# Patient Record
Sex: Male | Born: 1965
Health system: Southern US, Community
[De-identification: ages and names within clinical notes are randomized; demographics above are authoritative.]

## PROBLEM LIST (undated history)

## (undated) DIAGNOSIS — E785 Hyperlipidemia, unspecified: Secondary | ICD-10-CM

## (undated) DIAGNOSIS — E119 Type 2 diabetes mellitus without complications: Secondary | ICD-10-CM

## (undated) DIAGNOSIS — I82409 Acute embolism and thrombosis of unspecified deep veins of unspecified lower extremity: Secondary | ICD-10-CM

## (undated) DIAGNOSIS — E669 Obesity, unspecified: Secondary | ICD-10-CM

## (undated) DIAGNOSIS — N529 Male erectile dysfunction, unspecified: Secondary | ICD-10-CM

## (undated) DIAGNOSIS — I739 Peripheral vascular disease, unspecified: Secondary | ICD-10-CM

## (undated) DIAGNOSIS — I10 Essential (primary) hypertension: Secondary | ICD-10-CM

## (undated) HISTORY — DX: Male erectile dysfunction, unspecified: N52.9

## (undated) HISTORY — DX: Hyperlipidemia, unspecified: E78.5

## (undated) HISTORY — DX: Acute embolism and thrombosis of unspecified deep veins of unspecified lower extremity: I82.409

## (undated) HISTORY — DX: Essential (primary) hypertension: I10

## (undated) HISTORY — DX: Peripheral vascular disease, unspecified: I73.9

## (undated) HISTORY — DX: Obesity, unspecified: E66.9

## (undated) HISTORY — DX: Type 2 diabetes mellitus without complications: E11.9

---

## 1998-01-22 ENCOUNTER — Emergency Department (HOSPITAL_COMMUNITY): Admission: EM | Admit: 1998-01-22 | Discharge: 1998-01-22 | Payer: Self-pay | Admitting: Emergency Medicine

## 1998-01-22 ENCOUNTER — Encounter: Payer: Self-pay | Admitting: Emergency Medicine

## 1998-01-24 ENCOUNTER — Encounter: Payer: Self-pay | Admitting: Emergency Medicine

## 1998-01-24 ENCOUNTER — Emergency Department (HOSPITAL_COMMUNITY): Admission: EM | Admit: 1998-01-24 | Discharge: 1998-01-24 | Payer: Self-pay | Admitting: *Deleted

## 1998-01-30 ENCOUNTER — Encounter: Admission: RE | Admit: 1998-01-30 | Discharge: 1998-04-02 | Payer: Self-pay | Admitting: Orthopedic Surgery

## 2001-06-27 ENCOUNTER — Encounter: Payer: Self-pay | Admitting: Emergency Medicine

## 2001-06-27 ENCOUNTER — Emergency Department (HOSPITAL_COMMUNITY): Admission: EM | Admit: 2001-06-27 | Discharge: 2001-06-27 | Payer: Self-pay | Admitting: *Deleted

## 2011-05-07 ENCOUNTER — Ambulatory Visit (INDEPENDENT_AMBULATORY_CARE_PROVIDER_SITE_OTHER): Payer: 59 | Admitting: Family Medicine

## 2011-05-07 DIAGNOSIS — Z0000 Encounter for general adult medical examination without abnormal findings: Secondary | ICD-10-CM

## 2011-05-07 DIAGNOSIS — N529 Male erectile dysfunction, unspecified: Secondary | ICD-10-CM | POA: Insufficient documentation

## 2011-05-07 DIAGNOSIS — I10 Essential (primary) hypertension: Secondary | ICD-10-CM | POA: Insufficient documentation

## 2011-05-07 DIAGNOSIS — E669 Obesity, unspecified: Secondary | ICD-10-CM | POA: Insufficient documentation

## 2011-05-07 LAB — POCT CBC
Granulocyte percent: 51 %G (ref 37–80)
HCT, POC: 44.8 % (ref 43.5–53.7)
Hemoglobin: 14.2 g/dL (ref 14.1–18.1)
Lymph, poc: 3 (ref 0.6–3.4)
MCH, POC: 28.3 pg (ref 27–31.2)
MCHC: 31.7 g/dL — AB (ref 31.8–35.4)
MCV: 89.2 fL (ref 80–97)
MID (cbc): 1.2 — AB (ref 0–0.9)
MPV: 9.6 fL (ref 0–99.8)
POC Granulocyte: 4.4 (ref 2–6.9)
POC LYMPH PERCENT: 34.9 %L (ref 10–50)
POC MID %: 14.1 %M — AB (ref 0–12)
Platelet Count, POC: 247 10*3/uL (ref 142–424)
RBC: 5.02 M/uL (ref 4.69–6.13)
RDW, POC: 16.6 %
WBC: 8.6 10*3/uL (ref 4.6–10.2)

## 2011-05-07 LAB — POCT URINALYSIS DIPSTICK
Bilirubin, UA: NEGATIVE
Blood, UA: NEGATIVE
Glucose, UA: NEGATIVE
Ketones, UA: NEGATIVE
Leukocytes, UA: NEGATIVE
Nitrite, UA: NEGATIVE
Protein, UA: NEGATIVE
Spec Grav, UA: 1.02
Urobilinogen, UA: 0.2
pH, UA: 5

## 2011-05-07 LAB — COMPREHENSIVE METABOLIC PANEL
ALT: 10 U/L (ref 0–53)
AST: 14 U/L (ref 0–37)
Albumin: 4 g/dL (ref 3.5–5.2)
Alkaline Phosphatase: 64 U/L (ref 39–117)
BUN: 16 mg/dL (ref 6–23)
CO2: 28 mEq/L (ref 19–32)
Calcium: 9.5 mg/dL (ref 8.4–10.5)
Chloride: 103 mEq/L (ref 96–112)
Creat: 1.29 mg/dL (ref 0.50–1.35)
Glucose, Bld: 91 mg/dL (ref 70–99)
Potassium: 4.2 mEq/L (ref 3.5–5.3)
Sodium: 137 mEq/L (ref 135–145)
Total Bilirubin: 0.4 mg/dL (ref 0.3–1.2)
Total Protein: 6.3 g/dL (ref 6.0–8.3)

## 2011-05-07 LAB — LIPID PANEL
Cholesterol: 177 mg/dL (ref 0–200)
HDL: 47 mg/dL (ref >39)
LDL Cholesterol: 114 mg/dL — ABNORMAL HIGH (ref 0–99)
Total CHOL/HDL Ratio: 3.8 Ratio
Triglycerides: 79 mg/dL (ref <150)
VLDL: 16 mg/dL (ref 0–40)

## 2011-05-07 LAB — POCT UA - MICROSCOPIC ONLY
Casts, Ur, LPF, POC: NEGATIVE
Crystals, Ur, HPF, POC: NEGATIVE
Mucus, UA: NEGATIVE
Yeast, UA: NEGATIVE

## 2011-05-07 MED ORDER — HYDROCHLOROTHIAZIDE 25 MG PO TABS: 25.0000 mg | ORAL_TABLET | Freq: Every day | ORAL | Status: DC

## 2011-05-07 MED ORDER — CLONIDINE HCL 0.2 MG PO TABS: 0.2000 mg | ORAL_TABLET | Freq: Three times a day (TID) | ORAL | Status: DC

## 2011-05-07 MED ORDER — SILDENAFIL CITRATE 100 MG PO TABS: 50.0000 mg | ORAL_TABLET | Freq: Every day | ORAL | Status: DC | PRN

## 2011-05-07 MED ORDER — AMLODIPINE BESYLATE 10 MG PO TABS: 10.0000 mg | ORAL_TABLET | Freq: Every day | ORAL | Status: DC

## 2011-05-07 NOTE — Progress Notes (Signed)
This is a 46 year old gentleman comes in for complete physical. 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 Has long history of hypertension which has been controlled on 3 medications. His father died at 89 of a heart attack and his mother died at an early age of cirrhosis of the liver. He continues to smoke about 10 cigarettes a day. He complains of erectile dysfunction.  Objective: 46 year old gentleman appearing stated age he is morbidly obese. He is in no acute distress. He is alert and cooperative.  Skin warm and dry  HEENT: Unremarkable with normal fundi.  Neck supple no adenopathy no thyromegaly.  Axilla no adenopathy  Chest clear to auscultation  Heart regular no murmur or gallop  Abdomen: Protuberant no masses or HSM palpable  Extremities: No edema, full range of motion  Genitalia: Normal  EKG:  occas PVC  Assessment: Patient is at high risk for cardiac disease. His risk factors of smoking, hypertension, erectile dysfunction, and family history.  Plan: Cardiology evaluation, continue current medication, Rx for Viagra and encourage smoking cessation

## 2012-05-05 ENCOUNTER — Ambulatory Visit (INDEPENDENT_AMBULATORY_CARE_PROVIDER_SITE_OTHER): Payer: 59 | Admitting: Emergency Medicine

## 2012-05-05 VITALS — BP 106/64 | HR 63 | Temp 98.4°F | Resp 18 | Ht 72.0 in | Wt 333.6 lb

## 2012-05-05 DIAGNOSIS — I10 Essential (primary) hypertension: Secondary | ICD-10-CM

## 2012-05-05 DIAGNOSIS — N529 Male erectile dysfunction, unspecified: Secondary | ICD-10-CM

## 2012-05-05 DIAGNOSIS — Z0000 Encounter for general adult medical examination without abnormal findings: Secondary | ICD-10-CM

## 2012-05-05 LAB — POCT CBC
Granulocyte percent: 51.3 %G (ref 37–80)
HCT, POC: 44.3 % (ref 43.5–53.7)
Hemoglobin: 14.1 g/dL (ref 14.1–18.1)
Lymph, poc: 3.2 (ref 0.6–3.4)
MCH, POC: 29.1 pg (ref 27–31.2)
MCHC: 31.8 g/dL (ref 31.8–35.4)
MCV: 91.5 fL (ref 80–97)
MID (cbc): 1 — AB (ref 0–0.9)
MPV: 9.1 fL (ref 0–99.8)
POC Granulocyte: 4.5 (ref 2–6.9)
POC LYMPH PERCENT: 36.8 %L (ref 10–50)
POC MID %: 11.9 %M (ref 0–12)
Platelet Count, POC: 245 10*3/uL (ref 142–424)
RBC: 4.84 M/uL (ref 4.69–6.13)
RDW, POC: 15.7 %
WBC: 8.8 10*3/uL (ref 4.6–10.2)

## 2012-05-05 LAB — LIPID PANEL
Cholesterol: 184 mg/dL (ref 0–200)
HDL: 46 mg/dL (ref >39)
LDL Cholesterol: 118 mg/dL — ABNORMAL HIGH (ref 0–99)
Total CHOL/HDL Ratio: 4 Ratio
Triglycerides: 100 mg/dL (ref <150)
VLDL: 20 mg/dL (ref 0–40)

## 2012-05-05 LAB — COMPREHENSIVE METABOLIC PANEL
ALT: 15 U/L (ref 0–53)
AST: 15 U/L (ref 0–37)
Albumin: 4.2 g/dL (ref 3.5–5.2)
Alkaline Phosphatase: 61 U/L (ref 39–117)
BUN: 18 mg/dL (ref 6–23)
CO2: 31 mEq/L (ref 19–32)
Calcium: 10.1 mg/dL (ref 8.4–10.5)
Chloride: 105 mEq/L (ref 96–112)
Creat: 1.32 mg/dL (ref 0.50–1.35)
Glucose, Bld: 97 mg/dL (ref 70–99)
Potassium: 5.3 mEq/L (ref 3.5–5.3)
Sodium: 141 mEq/L (ref 135–145)
Total Bilirubin: 0.5 mg/dL (ref 0.3–1.2)
Total Protein: 6.5 g/dL (ref 6.0–8.3)

## 2012-05-05 LAB — TSH: TSH: 0.527 u[IU]/mL (ref 0.350–4.500)

## 2012-05-05 LAB — IFOBT (OCCULT BLOOD): IFOBT: NEGATIVE

## 2012-05-05 MED ORDER — CLONIDINE HCL 0.2 MG PO TABS: ORAL_TABLET | ORAL | Status: DC

## 2012-05-05 MED ORDER — HYDROCHLOROTHIAZIDE 25 MG PO TABS: 25.0000 mg | ORAL_TABLET | Freq: Every day | ORAL | Status: DC

## 2012-05-05 MED ORDER — SILDENAFIL CITRATE 100 MG PO TABS: 50.0000 mg | ORAL_TABLET | Freq: Every day | ORAL | Status: DC | PRN

## 2012-05-05 MED ORDER — AMLODIPINE BESYLATE 10 MG PO TABS: 10.0000 mg | ORAL_TABLET | Freq: Every day | ORAL | Status: DC

## 2012-05-05 NOTE — Progress Notes (Signed)
  Subjective:    Patient ID: Carlos Phillips, male    DOB: July 01, 1965, 47 y.o.   MRN: [id number]  HPI  Patient presents with need for complete physical/DOT. He has hypertension and is currently on Amlodipine. No sleep apnea or other chronic medical problems. He has problems with the ED and takes Viagra for this  Review of Systems     Objective:   Physical Exam HEENT exam. Pupil equal round regular react to light direct and consensual. Extraocular movements intact. Nose normal. Throat normal chest is clear to auscultation and percussion. Heart regular rate no murmurs rubs or gallops. Abdomen soft liver spleen not enlarged nontender GU normal male testicles descended.rectal exam reveals no masses  Results for orders placed in visit on 05/05/12  POCT CBC      Result Value Range   WBC 8.8  4.6 - 10.2 K/uL   Lymph, poc 3.2  0.6 - 3.4   POC LYMPH PERCENT 36.8  10 - 50 %L   MID (cbc) 1.0 (*) 0 - 0.9   POC MID % 11.9  0 - 12 %M   POC Granulocyte 4.5  2 - 6.9   Granulocyte percent 51.3  37 - 80 %G   RBC 4.84  4.69 - 6.13 M/uL   Hemoglobin 14.1  14.1 - 18.1 g/dL   HCT, POC 40.9  81.1 - 53.7 %   MCV 91.5  80 - 97 fL   MCH, POC 29.1  27 - 31.2 pg   MCHC 31.8  31.8 - 35.4 g/dL   RDW, POC 91.4     Platelet Count, POC 245  142 - 424 K/uL   MPV 9.1  0 - 99.8 fL  IFOBT (OCCULT BLOOD)      Result Value Range   IFOBT Negative          Assessment & Plan:  Physical exam is within normal limits. He is requesting Viagra for EGD. He's been taking his blood pressure medications but the clonidine makes him somewhat drowsy in the afternoon with his midday dose. His blood pressure today is 106/70 and he can certainly this tolerate stopping the medication at that time. Routine labs were done as stated and his meds were refilled. He does qualify for one year DOT and this form was completed .

## 2012-05-06 LAB — PSA: PSA: 0.48 ng/mL (ref <=4.00)

## 2012-06-16 ENCOUNTER — Other Ambulatory Visit: Payer: Self-pay | Admitting: Family Medicine

## 2013-02-25 ENCOUNTER — Emergency Department (HOSPITAL_COMMUNITY): Payer: BC Managed Care – PPO

## 2013-02-25 ENCOUNTER — Emergency Department (HOSPITAL_COMMUNITY): Payer: BC Managed Care – PPO | Admitting: Certified Registered"

## 2013-02-25 ENCOUNTER — Encounter (HOSPITAL_COMMUNITY): Payer: BC Managed Care – PPO | Admitting: Certified Registered"

## 2013-02-25 ENCOUNTER — Encounter (HOSPITAL_COMMUNITY)
Admission: EM | Disposition: A | Discharge status: Home Health | Payer: Self-pay | Source: Home / Self Care | Attending: Vascular Surgery

## 2013-02-25 ENCOUNTER — Inpatient Hospital Stay (HOSPITAL_COMMUNITY)
Admission: EM | Admit: 2013-02-25 | Discharge: 2013-03-04 | DRG: 253 | Disposition: A | Discharge status: Home Health | Payer: BC Managed Care – PPO | Attending: Vascular Surgery | Admitting: Vascular Surgery

## 2013-02-25 ENCOUNTER — Encounter (HOSPITAL_COMMUNITY): Payer: Self-pay | Admitting: Emergency Medicine

## 2013-02-25 DIAGNOSIS — I743 Embolism and thrombosis of arteries of the lower extremities: Principal | ICD-10-CM | POA: Diagnosis present

## 2013-02-25 DIAGNOSIS — D62 Acute posthemorrhagic anemia: Secondary | ICD-10-CM | POA: Diagnosis not present

## 2013-02-25 DIAGNOSIS — E669 Obesity, unspecified: Secondary | ICD-10-CM

## 2013-02-25 DIAGNOSIS — M79609 Pain in unspecified limb: Secondary | ICD-10-CM

## 2013-02-25 DIAGNOSIS — Z79899 Other long term (current) drug therapy: Secondary | ICD-10-CM

## 2013-02-25 DIAGNOSIS — I998 Other disorder of circulatory system: Secondary | ICD-10-CM | POA: Diagnosis present

## 2013-02-25 DIAGNOSIS — M79605 Pain in left leg: Secondary | ICD-10-CM

## 2013-02-25 DIAGNOSIS — Z6841 Body Mass Index (BMI) 40.0 and over, adult: Secondary | ICD-10-CM

## 2013-02-25 DIAGNOSIS — I70209 Unspecified atherosclerosis of native arteries of extremities, unspecified extremity: Secondary | ICD-10-CM | POA: Diagnosis present

## 2013-02-25 DIAGNOSIS — I10 Essential (primary) hypertension: Secondary | ICD-10-CM | POA: Diagnosis present

## 2013-02-25 DIAGNOSIS — I708 Atherosclerosis of other arteries: Secondary | ICD-10-CM | POA: Diagnosis present

## 2013-02-25 DIAGNOSIS — I82409 Acute embolism and thrombosis of unspecified deep veins of unspecified lower extremity: Secondary | ICD-10-CM

## 2013-02-25 DIAGNOSIS — I739 Peripheral vascular disease, unspecified: Secondary | ICD-10-CM

## 2013-02-25 DIAGNOSIS — F17200 Nicotine dependence, unspecified, uncomplicated: Secondary | ICD-10-CM | POA: Diagnosis present

## 2013-02-25 HISTORY — DX: Acute embolism and thrombosis of unspecified deep veins of unspecified lower extremity: I82.409

## 2013-02-25 HISTORY — PX: EMBOLECTOMY: SHX44

## 2013-02-25 LAB — COMPREHENSIVE METABOLIC PANEL
ALT: 16 U/L (ref 0–53)
AST: 18 U/L (ref 0–37)
Albumin: 3.3 g/dL — ABNORMAL LOW (ref 3.5–5.2)
Alkaline Phosphatase: 84 U/L (ref 39–117)
BUN: 17 mg/dL (ref 6–23)
CO2: 23 mEq/L (ref 19–32)
Calcium: 9.5 mg/dL (ref 8.4–10.5)
Chloride: 103 mEq/L (ref 96–112)
Creatinine, Ser: 0.82 mg/dL (ref 0.50–1.35)
GFR calc Af Amer: >90 mL/min (ref >90)
GFR calc non Af Amer: >90 mL/min (ref >90)
Glucose, Bld: 97 mg/dL (ref 70–99)
Potassium: 3.9 mEq/L (ref 3.5–5.1)
Sodium: 138 mEq/L (ref 135–145)
Total Bilirubin: 0.5 mg/dL (ref 0.3–1.2)
Total Protein: 6.8 g/dL (ref 6.0–8.3)

## 2013-02-25 LAB — BASIC METABOLIC PANEL
BUN: 8 mg/dL (ref 6–23)
CO2: 22 mEq/L (ref 19–32)
Calcium: 9.4 mg/dL (ref 8.4–10.5)
Chloride: 97 mEq/L (ref 96–112)
Creatinine, Ser: 0.93 mg/dL (ref 0.50–1.35)
GFR calc Af Amer: >90 mL/min (ref >90)
GFR calc non Af Amer: >90 mL/min (ref >90)
Glucose, Bld: 163 mg/dL — ABNORMAL HIGH (ref 70–99)
Potassium: 4 mEq/L (ref 3.5–5.1)
Sodium: 133 mEq/L — ABNORMAL LOW (ref 135–145)

## 2013-02-25 LAB — CBC
HCT: 44.8 % (ref 39.0–52.0)
HCT: 44.9 % (ref 39.0–52.0)
Hemoglobin: 15.6 g/dL (ref 13.0–17.0)
Hemoglobin: 15.9 g/dL (ref 13.0–17.0)
MCH: 29.6 pg (ref 26.0–34.0)
MCH: 30.2 pg (ref 26.0–34.0)
MCHC: 34.8 g/dL (ref 30.0–36.0)
MCHC: 35.4 g/dL (ref 30.0–36.0)
MCV: 85 fL (ref 78.0–100.0)
MCV: 85.4 fL (ref 78.0–100.0)
Platelets: 222 10*3/uL (ref 150–400)
Platelets: 245 10*3/uL (ref 150–400)
RBC: 5.27 MIL/uL (ref 4.22–5.81)
RBC: 5.26 MIL/uL (ref 4.22–5.81)
RDW: 14.6 % (ref 11.5–15.5)
RDW: 14.6 % (ref 11.5–15.5)
WBC: 11.3 10*3/uL — ABNORMAL HIGH (ref 4.0–10.5)
WBC: 15.2 10*3/uL — ABNORMAL HIGH (ref 4.0–10.5)

## 2013-02-25 LAB — TYPE AND SCREEN
ABO/RH(D): A POS
Antibody Screen: NEGATIVE

## 2013-02-25 LAB — ABO/RH: ABO/RH(D): A POS

## 2013-02-25 LAB — PROTIME-INR
INR: 0.87 (ref 0.00–1.49)
Prothrombin Time: 11.7 seconds (ref 11.6–15.2)

## 2013-02-25 LAB — HEPARIN LEVEL (UNFRACTIONATED): Heparin Unfractionated: >2.2 IU/mL — ABNORMAL HIGH (ref 0.30–0.70)

## 2013-02-25 SURGERY — EMBOLECTOMY
Anesthesia: General | Site: Leg Lower | Laterality: Left

## 2013-02-25 MED ORDER — PROMETHAZINE HCL 25 MG/ML IJ SOLN: 6.2500 mg | INTRAMUSCULAR | Status: DC | PRN

## 2013-02-25 MED ORDER — MIDAZOLAM HCL 2 MG/2ML IJ SOLN
0.5000 mg | Freq: Once | INTRAMUSCULAR | Status: AC | PRN
  Administered 2013-02-25: 1 mg via INTRAVENOUS

## 2013-02-25 MED ORDER — AMLODIPINE BESYLATE 10 MG PO TABS
10.0000 mg | ORAL_TABLET | Freq: Every day | ORAL | Status: DC
  Administered 2013-02-26 – 2013-03-04 (×7): 10 mg via ORAL
  Filled 2013-02-25 (×7): qty 1

## 2013-02-25 MED ORDER — SUCCINYLCHOLINE CHLORIDE 20 MG/ML IJ SOLN
INTRAMUSCULAR | Status: DC | PRN
  Administered 2013-02-25: 100 mg via INTRAVENOUS

## 2013-02-25 MED ORDER — MIDAZOLAM HCL 5 MG/5ML IJ SOLN
INTRAMUSCULAR | Status: DC | PRN
  Administered 2013-02-25: 2 mg via INTRAVENOUS

## 2013-02-25 MED ORDER — PANTOPRAZOLE SODIUM 40 MG PO TBEC
40.0000 mg | DELAYED_RELEASE_TABLET | Freq: Every day | ORAL | Status: DC
  Administered 2013-02-26 – 2013-03-04 (×7): 40 mg via ORAL
  Filled 2013-02-25 (×7): qty 1

## 2013-02-25 MED ORDER — HEPARIN (PORCINE) IN NACL 100-0.45 UNIT/ML-% IJ SOLN
1400.0000 [IU]/h | INTRAMUSCULAR | Status: DC
  Administered 2013-02-25: 1400 [IU]/h via INTRAVENOUS
  Filled 2013-02-25: qty 250

## 2013-02-25 MED ORDER — HYDROMORPHONE HCL PF 1 MG/ML IJ SOLN
1.0000 mg | Freq: Once | INTRAMUSCULAR | Status: AC
  Administered 2013-02-25: 1 mg via INTRAVENOUS
  Filled 2013-02-25: qty 1

## 2013-02-25 MED ORDER — ONDANSETRON HCL 4 MG/2ML IJ SOLN
4.0000 mg | Freq: Once | INTRAMUSCULAR | Status: AC
  Administered 2013-02-25: 4 mg via INTRAVENOUS
  Filled 2013-02-25: qty 2

## 2013-02-25 MED ORDER — THROMBIN 20000 UNITS EX KIT
PACK | CUTANEOUS | Status: DC | PRN
  Administered 2013-02-25: 21:00:00 via TOPICAL

## 2013-02-25 MED ORDER — FENTANYL CITRATE 0.05 MG/ML IJ SOLN
50.0000 ug | Freq: Once | INTRAMUSCULAR | Status: AC
  Administered 2013-02-25: 50 ug via INTRAVENOUS
  Filled 2013-02-25: qty 2

## 2013-02-25 MED ORDER — LABETALOL HCL 5 MG/ML IV SOLN
10.0000 mg | INTRAVENOUS | Status: DC | PRN
  Administered 2013-02-25: 10 mg via INTRAVENOUS
  Filled 2013-02-25 (×2): qty 4

## 2013-02-25 MED ORDER — MORPHINE SULFATE 2 MG/ML IJ SOLN
2.0000 mg | INTRAMUSCULAR | Status: DC | PRN
Start: 2013-02-25 — End: 2013-03-04
  Administered 2013-02-25 – 2013-02-26 (×3): 4 mg via INTRAVENOUS
  Filled 2013-02-25 (×3): qty 2

## 2013-02-25 MED ORDER — HYDROMORPHONE HCL PF 1 MG/ML IJ SOLN: 0.2500 mg | INTRAMUSCULAR | Status: DC | PRN

## 2013-02-25 MED ORDER — HYDRALAZINE HCL 20 MG/ML IJ SOLN
10.0000 mg | INTRAMUSCULAR | Status: DC | PRN
  Administered 2013-02-25 – 2013-02-26 (×2): 10 mg via INTRAVENOUS
  Filled 2013-02-25: qty 1

## 2013-02-25 MED ORDER — SODIUM CHLORIDE 0.9 % IV SOLN: 500.0000 mL | Freq: Once | INTRAVENOUS | Status: AC | PRN

## 2013-02-25 MED ORDER — POTASSIUM CHLORIDE CRYS ER 20 MEQ PO TBCR: 20.0000 meq | EXTENDED_RELEASE_TABLET | Freq: Once | ORAL | Status: AC | PRN

## 2013-02-25 MED ORDER — IOHEXOL 300 MG/ML  SOLN
INTRAMUSCULAR | Status: DC | PRN
  Administered 2013-02-25: 20 mL via INTRAVENOUS

## 2013-02-25 MED ORDER — MIDAZOLAM HCL 2 MG/2ML IJ SOLN
INTRAMUSCULAR | Status: AC
  Administered 2013-02-25: 1 mg
  Filled 2013-02-25: qty 2

## 2013-02-25 MED ORDER — TEMAZEPAM 15 MG PO CAPS: 15.0000 mg | ORAL_CAPSULE | Freq: Every evening | ORAL | Status: DC | PRN

## 2013-02-25 MED ORDER — GLYCOPYRROLATE 0.2 MG/ML IJ SOLN
INTRAMUSCULAR | Status: DC | PRN
  Administered 2013-02-25: 0.4 mg via INTRAVENOUS

## 2013-02-25 MED ORDER — IOHEXOL 350 MG/ML SOLN
100.0000 mL | Freq: Once | INTRAVENOUS | Status: AC | PRN
  Administered 2013-02-25: 100 mL via INTRAVENOUS

## 2013-02-25 MED ORDER — METOPROLOL TARTRATE 1 MG/ML IV SOLN: 2.0000 mg | INTRAVENOUS | Status: DC | PRN

## 2013-02-25 MED ORDER — SODIUM CHLORIDE 0.9 % IR SOLN
Status: DC | PRN
  Administered 2013-02-25: 17:00:00

## 2013-02-25 MED ORDER — DEXAMETHASONE SODIUM PHOSPHATE 4 MG/ML IJ SOLN
INTRAMUSCULAR | Status: DC | PRN
  Administered 2013-02-25: 4 mg via INTRAVENOUS

## 2013-02-25 MED ORDER — DEXTROSE 5 % IV SOLN
1.5000 g | Freq: Two times a day (BID) | INTRAVENOUS | Status: AC
  Administered 2013-02-26 (×2): 1.5 g via INTRAVENOUS
  Filled 2013-02-25 (×2): qty 1.5

## 2013-02-25 MED ORDER — PROPOFOL 10 MG/ML IV BOLUS
INTRAVENOUS | Status: DC | PRN
  Administered 2013-02-25: 300 mg via INTRAVENOUS

## 2013-02-25 MED ORDER — 0.9 % SODIUM CHLORIDE (POUR BTL) OPTIME
TOPICAL | Status: DC | PRN
  Administered 2013-02-25: 1000 mL

## 2013-02-25 MED ORDER — ROCURONIUM BROMIDE 100 MG/10ML IV SOLN
INTRAVENOUS | Status: DC | PRN
  Administered 2013-02-25: 30 mg via INTRAVENOUS
  Administered 2013-02-25: 20 mg via INTRAVENOUS

## 2013-02-25 MED ORDER — OXYCODONE HCL 5 MG PO TABS: 5.0000 mg | ORAL_TABLET | Freq: Once | ORAL | Status: DC | PRN

## 2013-02-25 MED ORDER — CEFAZOLIN SODIUM-DEXTROSE 2-3 GM-% IV SOLR
2.0000 g | Freq: Once | INTRAVENOUS | Status: AC
  Administered 2013-02-25: 2 g via INTRAVENOUS
  Administered 2013-02-25: 1 g via INTRAVENOUS

## 2013-02-25 MED ORDER — DOPAMINE-DEXTROSE 3.2-5 MG/ML-% IV SOLN
3.0000 ug/kg/min | INTRAVENOUS | Status: DC
  Administered 2013-02-25: 3 ug/kg/min via INTRAVENOUS
  Filled 2013-02-25: qty 250

## 2013-02-25 MED ORDER — NEOSTIGMINE METHYLSULFATE 1 MG/ML IJ SOLN
INTRAMUSCULAR | Status: DC | PRN
  Administered 2013-02-25: 3 mg via INTRAVENOUS

## 2013-02-25 MED ORDER — ACETAMINOPHEN 325 MG RE SUPP
325.0000 mg | RECTAL | Status: DC | PRN
  Filled 2013-02-25: qty 2

## 2013-02-25 MED ORDER — THROMBIN 20000 UNITS EX SOLR
CUTANEOUS | Status: AC
  Filled 2013-02-25: qty 20000

## 2013-02-25 MED ORDER — PHENOL 1.4 % MT LIQD: 1.0000 | OROMUCOSAL | Status: DC | PRN

## 2013-02-25 MED ORDER — ONDANSETRON HCL 4 MG/2ML IJ SOLN
INTRAMUSCULAR | Status: DC | PRN
  Administered 2013-02-25: 4 mg via INTRAVENOUS

## 2013-02-25 MED ORDER — SODIUM CHLORIDE 0.9 % IV SOLN
INTRAVENOUS | Status: DC
  Administered 2013-02-25 (×2): via INTRAVENOUS

## 2013-02-25 MED ORDER — ALUM & MAG HYDROXIDE-SIMETH 200-200-20 MG/5ML PO SUSP: 15.0000 mL | ORAL | Status: DC | PRN

## 2013-02-25 MED ORDER — ONDANSETRON HCL 4 MG/2ML IJ SOLN
4.0000 mg | Freq: Four times a day (QID) | INTRAMUSCULAR | Status: DC | PRN
  Filled 2013-02-25: qty 2

## 2013-02-25 MED ORDER — CEFAZOLIN SODIUM 1-5 GM-% IV SOLN
INTRAVENOUS | Status: AC
  Filled 2013-02-25: qty 50

## 2013-02-25 MED ORDER — HYDROCHLOROTHIAZIDE 25 MG PO TABS
25.0000 mg | ORAL_TABLET | Freq: Every day | ORAL | Status: DC
  Administered 2013-02-26 – 2013-03-04 (×7): 25 mg via ORAL
  Filled 2013-02-25 (×7): qty 1

## 2013-02-25 MED ORDER — FENTANYL CITRATE 0.05 MG/ML IJ SOLN
INTRAMUSCULAR | Status: DC | PRN
  Administered 2013-02-25: 50 ug via INTRAVENOUS
  Administered 2013-02-25: 100 ug via INTRAVENOUS
  Administered 2013-02-25 (×2): 50 ug via INTRAVENOUS

## 2013-02-25 MED ORDER — CEFAZOLIN SODIUM-DEXTROSE 2-3 GM-% IV SOLR
INTRAVENOUS | Status: AC
  Filled 2013-02-25: qty 50

## 2013-02-25 MED ORDER — GUAIFENESIN-DM 100-10 MG/5ML PO SYRP: 15.0000 mL | ORAL_SOLUTION | ORAL | Status: DC | PRN

## 2013-02-25 MED ORDER — LABETALOL HCL 5 MG/ML IV SOLN
INTRAVENOUS | Status: AC
  Filled 2013-02-25: qty 4

## 2013-02-25 MED ORDER — MEPERIDINE HCL 25 MG/ML IJ SOLN: 6.2500 mg | INTRAMUSCULAR | Status: DC | PRN

## 2013-02-25 MED ORDER — CLONIDINE HCL 0.2 MG PO TABS
0.2000 mg | ORAL_TABLET | Freq: Every day | ORAL | Status: DC
  Administered 2013-02-26 – 2013-03-04 (×7): 0.2 mg via ORAL
  Filled 2013-02-25 (×7): qty 1

## 2013-02-25 MED ORDER — ARTIFICIAL TEARS OP OINT
TOPICAL_OINTMENT | OPHTHALMIC | Status: DC | PRN
  Administered 2013-02-25: 1 via OPHTHALMIC

## 2013-02-25 MED ORDER — DOCUSATE SODIUM 100 MG PO CAPS
100.0000 mg | ORAL_CAPSULE | Freq: Every day | ORAL | Status: DC
  Administered 2013-02-26 – 2013-03-04 (×7): 100 mg via ORAL
  Filled 2013-02-25 (×7): qty 1

## 2013-02-25 MED ORDER — HEPARIN SODIUM (PORCINE) 1000 UNIT/ML IJ SOLN
INTRAMUSCULAR | Status: DC | PRN
  Administered 2013-02-25 (×2): 10000 [IU] via INTRAVENOUS

## 2013-02-25 MED ORDER — LACTATED RINGERS IV SOLN
INTRAVENOUS | Status: DC | PRN
  Administered 2013-02-25 (×2): via INTRAVENOUS

## 2013-02-25 MED ORDER — OXYCODONE HCL 5 MG PO TABS
5.0000 mg | ORAL_TABLET | ORAL | Status: DC | PRN
  Administered 2013-02-26: 10 mg via ORAL
  Filled 2013-02-25: qty 2

## 2013-02-25 MED ORDER — HEPARIN BOLUS VIA INFUSION
5000.0000 [IU] | Freq: Once | INTRAVENOUS | Status: AC
  Administered 2013-02-25: 5000 [IU] via INTRAVENOUS
  Filled 2013-02-25: qty 5000

## 2013-02-25 MED ORDER — ACETAMINOPHEN 325 MG PO TABS: 325.0000 mg | ORAL_TABLET | ORAL | Status: DC | PRN

## 2013-02-25 MED ORDER — OXYCODONE HCL 5 MG/5ML PO SOLN: 5.0000 mg | Freq: Once | ORAL | Status: DC | PRN

## 2013-02-25 MED ORDER — SODIUM CHLORIDE 0.9 % IV SOLN: INTRAVENOUS | Status: DC

## 2013-02-25 SURGICAL SUPPLY — 61 items
ADH SKN CLS APL DERMABOND .7 (GAUZE/BANDAGES/DRESSINGS)
BANDAGE ELASTIC 6 VELCRO ST LF (GAUZE/BANDAGES/DRESSINGS) ×1 IMPLANT
BANDAGE ESMARK 6X9 LF (GAUZE/BANDAGES/DRESSINGS) IMPLANT
BANDAGE GAUZE ELAST BULKY 4 IN (GAUZE/BANDAGES/DRESSINGS) ×1 IMPLANT
BNDG CMPR 9X6 STRL LF SNTH (GAUZE/BANDAGES/DRESSINGS)
BNDG ESMARK 6X9 LF (GAUZE/BANDAGES/DRESSINGS)
CANISTER SUCTION 2500CC (MISCELLANEOUS) ×2 IMPLANT
CANNULA VESSEL 3MM 2 BLNT TIP (CANNULA) ×1 IMPLANT
CATH EMB 2FR 60CM (CATHETERS) ×1 IMPLANT
CATH EMB 3FR 80CM (CATHETERS) ×2 IMPLANT
CATH EMB 4FR 80CM (CATHETERS) ×1 IMPLANT
CATH EMB 5FR 80CM (CATHETERS) IMPLANT
CLIP TI MEDIUM 24 (CLIP) ×2 IMPLANT
CLIP TI WIDE RED SMALL 24 (CLIP) ×2 IMPLANT
CONT SPEC 4OZ CLIKSEAL STRL BL (MISCELLANEOUS) ×1 IMPLANT
COVER SURGICAL LIGHT HANDLE (MISCELLANEOUS) ×2 IMPLANT
CUFF TOURNIQUET SINGLE 24IN (TOURNIQUET CUFF) IMPLANT
CUFF TOURNIQUET SINGLE 34IN LL (TOURNIQUET CUFF) IMPLANT
CUFF TOURNIQUET SINGLE 44IN (TOURNIQUET CUFF) IMPLANT
DECANTER SPIKE VIAL GLASS SM (MISCELLANEOUS) IMPLANT
DERMABOND ADVANCED (GAUZE/BANDAGES/DRESSINGS)
DERMABOND ADVANCED .7 DNX12 (GAUZE/BANDAGES/DRESSINGS) IMPLANT
DRAIN SNY 10X20 3/4 PERF (WOUND CARE) IMPLANT
DRAPE WARM FLUID 44X44 (DRAPE) ×1 IMPLANT
DRAPE X-RAY CASS 24X20 (DRAPES) ×1 IMPLANT
DRSG COVADERM 4X8 (GAUZE/BANDAGES/DRESSINGS) IMPLANT
ELECT REM PT RETURN 9FT ADLT (ELECTROSURGICAL) ×2
ELECTRODE REM PT RTRN 9FT ADLT (ELECTROSURGICAL) ×1 IMPLANT
EVACUATOR SILICONE 100CC (DRAIN) IMPLANT
GLOVE BIO SURGEON STRL SZ7.5 (GLOVE) ×2 IMPLANT
GOWN PREVENTION PLUS XLARGE (GOWN DISPOSABLE) ×2 IMPLANT
GOWN STRL NON-REIN LRG LVL3 (GOWN DISPOSABLE) ×6 IMPLANT
KIT BASIN OR (CUSTOM PROCEDURE TRAY) ×2 IMPLANT
KIT ROOM TURNOVER OR (KITS) ×2 IMPLANT
NS IRRIG 1000ML POUR BTL (IV SOLUTION) ×4 IMPLANT
PACK PERIPHERAL VASCULAR (CUSTOM PROCEDURE TRAY) ×2 IMPLANT
PAD ARMBOARD 7.5X6 YLW CONV (MISCELLANEOUS) ×4 IMPLANT
PADDING CAST COTTON 6X4 STRL (CAST SUPPLIES) IMPLANT
SET COLLECT BLD 21X3/4 12 (NEEDLE) ×1 IMPLANT
SPONGE GAUZE 4X4 12PLY (GAUZE/BANDAGES/DRESSINGS) ×1 IMPLANT
SPONGE SURGIFOAM ABS GEL 100 (HEMOSTASIS) IMPLANT
STAPLER VISISTAT 35W (STAPLE) ×1 IMPLANT
STOPCOCK 4 WAY LG BORE MALE ST (IV SETS) ×1 IMPLANT
SUT PROLENE 5 0 C 1 24 (SUTURE) ×3 IMPLANT
SUT PROLENE 6 0 CC (SUTURE) ×4 IMPLANT
SUT PROLENE 7 0 BV 1 (SUTURE) ×1 IMPLANT
SUT PROLENE 7 0 BV1 MDA (SUTURE) ×1 IMPLANT
SUT VIC AB 2-0 CT1 27 (SUTURE) ×2
SUT VIC AB 2-0 CT1 TAPERPNT 27 (SUTURE) IMPLANT
SUT VIC AB 2-0 CTX 36 (SUTURE) ×1 IMPLANT
SUT VIC AB 3-0 SH 27 (SUTURE) ×2
SUT VIC AB 3-0 SH 27X BRD (SUTURE) ×1 IMPLANT
SUT VICRYL 4-0 PS2 18IN ABS (SUTURE) ×1 IMPLANT
SYR 3ML LL SCALE MARK (SYRINGE) ×2 IMPLANT
SYR TB 1ML LUER SLIP (SYRINGE) ×1 IMPLANT
TOWEL OR 17X24 6PK STRL BLUE (TOWEL DISPOSABLE) ×4 IMPLANT
TOWEL OR 17X26 10 PK STRL BLUE (TOWEL DISPOSABLE) ×4 IMPLANT
TRAY FOLEY CATH 16FRSI W/METER (SET/KITS/TRAYS/PACK) ×1 IMPLANT
TUBING EXTENTION W/L.L. (IV SETS) ×1 IMPLANT
UNDERPAD 30X30 INCONTINENT (UNDERPADS AND DIAPERS) ×2 IMPLANT
WATER STERILE IRR 1000ML POUR (IV SOLUTION) ×1 IMPLANT

## 2013-02-25 NOTE — Anesthesia Preprocedure Evaluation (Addendum)
Anesthesia Evaluation  Patient identified by MRN, date of birth, ID band Patient awake    Reviewed: Allergy & Precautions, H&P , NPO status , Patient's Chart, lab work & pertinent test results, reviewed documented beta blocker date and time   History of Anesthesia Complications Negative for: history of anesthetic complications  Airway Mallampati: I TM Distance: >3 FB Neck ROM: Full    Dental  (+) Teeth Intact, Missing and Dental Advisory Given,    Pulmonary Current Smoker,  breath sounds clear to auscultation  Pulmonary exam normal       Cardiovascular hypertension, Pt. on medications + Peripheral Vascular Disease Rhythm:Regular Rate:Normal     Neuro/Psych    GI/Hepatic negative GI ROS, Neg liver ROS,   Endo/Other  Morbid obesity  Renal/GU negative Renal ROS     Musculoskeletal   Abdominal (+) + obese,   Peds  Hematology negative hematology ROS (+)   Anesthesia Other Findings   Reproductive/Obstetrics                         Anesthesia Physical Anesthesia Plan  ASA: III and emergent  Anesthesia Plan: General   Post-op Pain Management:    Induction: Intravenous and Rapid sequence  Airway Management Planned: Oral ETT  Additional Equipment:   Intra-op Plan:   Post-operative Plan: Extubation in OR  Informed Consent: I have reviewed the patients History and Physical, chart, labs and discussed the procedure including the risks, benefits and alternatives for the proposed anesthesia with the patient or authorized representative who has indicated his/her understanding and acceptance.   Dental advisory given  Plan Discussed with: CRNA and Surgeon  Anesthesia Plan Comments: (Plan routine monitors, GETA with RSI)        Anesthesia Quick Evaluation

## 2013-02-25 NOTE — ED Notes (Signed)
Vascular surgeon in to discuss results and surgical plan with pt and his wife

## 2013-02-25 NOTE — ED Notes (Signed)
According to EMS, patient is a truck driver, he has a history of hypertension.  Patient told EMS he woke up today and felt his left leg go "numb".  His wife took him to [facility] and they did an ultrasound of his left leg.  They think he has a DVT so he was transferred to our ED.  His leg is cold, clammy and negative for a pulse.   Patient was transferred here from [facility].

## 2013-02-25 NOTE — Consult Note (Signed)
Pt with clot left popliteal no real flow distally.  Will take to OR for embolectomy possible fasciotomy.  Procedure risk benefit discussed with pt and wife.  Charles Fields, MD [facility] Office: [phone] Pager: [phone]  

## 2013-02-25 NOTE — ED Notes (Signed)
REPORT CALLED TO KRISTIE IN THE OR

## 2013-02-25 NOTE — Transfer of Care (Signed)
Immediate Anesthesia Transfer of Care Note  Patient: LEMONTE AL  Procedure(s) Performed: Procedure(s) with comments: Left Popliteal EMBOLECTOMY Poss fasciotomy (Left) - Left poplital and Tibial embolectomy with four compartment Fasciotomy with vein patch angioplasty left popliteal artery. ANGIOGRAM EXTREMITY LEFT (Left) - Angiogram left lower leg  Patient Location: PACU  Anesthesia Type:General  Level of Consciousness: awake, alert , oriented and patient cooperative  Airway & Oxygen Therapy: Patient Spontanous Breathing and Patient connected to nasal cannula oxygen  Post-op Assessment: Report given to PACU RN, Post -op Vital signs reviewed and stable and Patient moving all extremities X 4  Post vital signs: Reviewed and stable  Complications: No apparent anesthesia complications

## 2013-02-25 NOTE — Progress Notes (Signed)
ANTICOAGULATION CONSULT NOTE - Initial Consult  Pharmacy Consult for heparin Indication: ischemic L foot  No Known Allergies  Patient Measurements: Height: 6' (182.9 cm) Weight: 350 lb (158.759 kg) IBW/kg (Calculated) : 77.6 Heparin Dosing Weight: 104.6  Vital Signs: Temp: 98.2 F (36.8 C) (12/01 1401) Temp src: Oral (12/01 1401) BP: 164/86 mmHg (12/01 1401) Pulse Rate: 80 (12/01 1401)  Labs:  Recent Labs  02/25/13 1210 02/25/13 1235  HGB 15.6  --   HCT 44.8  --   PLT 222  --   LABPROT 11.7  --   INR 0.87  --   CREATININE  --  0.82    Estimated Creatinine Clearance: 171.6 ml/min (by C-G formula based on Cr of 0.82).   Medical History: Past Medical History  Diagnosis Date  . Hypertension     Medications:   (Not in a hospital admission)  Assessment: 47 yo man transferred from [facility] to start heparin for ischemic left leg.  He was on no anticoagulation PTA and baseline INR wnl. Goal of Therapy:  Heparin level 0.3-0.7 units/ml Monitor platelets by anticoagulation protocol: Yes   Plan:  Heparin bolus 5000 units and drip at 1400 units/hr Check heparin level 6 hours after start. Heparin level and CBC daily while on heparin.  Carlos Phillips 02/25/2013,2:38 PM

## 2013-02-25 NOTE — Op Note (Signed)
Procedure: Left tibial embolectomy with patch angioplasty of left popliteal artery and tibial peroneal trunk, four compartment fasciotomy  Preoperative diagnosis: Embolus left leg  Postoperative diagnosis: Same  Anesthesia: Gen.  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 The patient was placed in supine position on the operating room table. After induction of general anesthesia and endotracheal ablation the patient's entire left lower extremity was prepped and draped in the usual sterile fashion. A longitudinal incision was made on the medial aspect of the left leg below the knee. The incision was carried into the subcutaneous tissues and the greater saphenous vein was identified. This was less than 3 mm in diameter. Several small side branches were ligated and divided between silk ties and the vein was reflected posteriorly. The incision was then deepened down the popliteal space by opening the fascia for the full length incision. The popliteal vein and artery were identified. The popliteal artery was dissected free circumferentially. Several centimeters of the soleus muscle was taken down with cautery to expose the lower portion of the popliteal artery and takeoff of the tibial vessels. Several side branches of the popliteal vein were ligated and divided between silk ties to provide exposure. The anterior tibial artery and tibioperoneal trunk were all dissected free circumferentially and vessel loops placed around these. The popliteal artery was dissected free circumferentially and a vessel loop was placed around this as well. There was no pulse within the popliteal artery. The  patient was given 10000 units of intravenous heparin. He was also given and additional 46962 units of heparin during the course of the case.  After appropriate circulation time, the artery was controlled proximally with a vessel loop and distally with vessel loops. A transverse opening was made in the distal below-knee popliteal artery just above the takeoff of the tibial vessels. There was fresh thrombus within the popliteal artery. After this was all then removed under direct vision,  #3 and 4 Fogarty catheters proximally up the popliteal artery and a large amount of fresh thrombus was removed. There was excellent arterial inflow. 2 clean passes were obtained. The catheter was passed its full length in each clean pass. The artery was controlled again with a vessel loop. Next the anterior tibial artery was selectively catheterized with a #3 and #2 Fogarty catheter. I was able to remove some embolic debris however the catheter would only pass half way down the anterior tibial artery before coming to an abrupt stop. This was presumed to be a chronic occlusion. There was good backbleeding from this. Several plugs of atheroembolic debris was also removed from the tibial peroneal trunk. There was good backbleeding from this as well.  The anterior tibial and tibioperoneal  were catheterized and multiple passes made until 2 clean passes were obtained. These were thoroughly irrigated with heparinized saline. Next the arteriotomy was reapproximated using interrupted 7-0 Prolene sutures. Just prior completion of the anastomosis it was forebled backbled and thoroughly flushed. All vessel loops were released and there was good pulsatile flow the popliteal artery immediately. However, there were still no Doppler flow to the foot. The patient had developed a fairly tight anterior lateral compartment. At this point these were both decompressed through a lateral incision. The  anterior and lateral compartments were both viable with  contractile muscle. The anterior compartment was slightly dusky initially but this pinked up rather quickly. An arteriogram was then performed by introducing a 21-gauge butterfly needle into the popliteal artery. With inflow occlusion, contrast was injected into the popliteal artery. The arteriogram showed a patent but small peroneal artery. The anterior tibial artery was occluded mid leg. The posterior tibial artery was occluded mid leg and then reconstituted distally by collaterals from the peroneal. At this point I decided to repeat the embolectomy further down the artery to make sure that the posterior tibial and peroneal arteries were completely cleared. Several small side branches were ligated and divided along the popliteal artery to expose the more distal posterior tibial artery. The posterior tibial artery and peroneal arteries were both dissected free circumferentially and vessel loops placed around these. There was some thickening of the tibia peroneal trunk from atherosclerotic plaque. This was also apparent on the arteriogram. The patient was given an additional 10,000 units of heparin. Vessel loops were then again used to control all 3 tibial vessels and the popliteal artery. A longitudinal opening was made in the tibia peroneal trunk with a 15 blade. The arteriotomy was extended proximally almost to the origin of the anterior tibial artery. It was extended distally to the takeoff of the posterior tibial and peroneal vessels. #2 and 3 Fogarty catheters were then selectively passed again down the posterior tibial and peroneal arteries. A large amount of thrombus was removed from the posterior tibial artery. 2 clean passes were obtained from the posterior tibial artery. No further debris was obtained from the peroneal artery. 2 clean passes were obtained from this vessel as well. I again attempted to thrombectomize the anterior tibial artery but again the catheter would only pass to the mid leg. A small  segment of saphenous vein was harvested and clipped proximally and distally. This was transected and opened longitudinally. This was then sewn on as a patch angioplasty using a running 6-0 Prolene suture. Just prior completion of the anastomosis it was again forebled backbled and thoroughly flushed. The patch begins just below the previous transverse arteriotomy. Vessel loops were released and there was pulsatile flow in the popliteal artery again. One repair stitch was placed. At this point the patient had biphasic Doppler flow to the posterior tibial and peroneal portions of the foot. There was still no dorsalis pedis Doppler signal.  Hemostasis was obtained. The skin was closed on the medial leg with staples. The fascia was left open. The lateral fasciotomy wound was also left open.  A dry sterile dressing was placed over the lateral fasciotomy and medial incision followed by an Ace wrap. The patient tolerated the procedure well and there were no complications. Instrument, sponge, and needle counts were correct at the end of the case. The patient was taken to the recovery room in stable condition. The patient had an easily audible biphasic posterior tibial and peroneal Doppler signal at the end of the case.  Fabienne Bruns, MD [facility] Office: [phone] Pager: [phone]

## 2013-02-25 NOTE — ED Provider Notes (Signed)
CSN: [id number]     Arrival date & time 02/25/13  1052 History   First MD Initiated Contact with Patient 02/25/13 1117     Chief Complaint  Patient presents with  . leg cramps    (Consider location/radiation/quality/duration/timing/severity/associated sxs/prior Treatment) The history is provided by the patient.  pt w hx htn, states awoke at approximately 2 am today to get ready for work, had onset left lower leg pain and cramping, esp pretibial area, but also calf. Pain moderate, non radiating, waxes and wanes in intensity. Denies preceding hx vascular disease, claudication, or dvt. Denies any injury to leg, or recent overuse, or unusual exercise/activity. No swelling. No redness. Also c/o mild numbness in toes of foot. States when went to bed yesterday felt fine/normal. No associated leg weakness. No fever or chills.      Past Medical History  Diagnosis Date  . Hypertension    History reviewed. No pertinent past surgical history. Family History  Problem Relation Age of Onset  . Diabetes Mother   . Heart disease Father   . Asthma Son    History  Substance Use Topics  . Smoking status: Current Every Day Smoker -- 1.00 packs/day    Types: Cigarettes  . Smokeless tobacco: Never Used  . Alcohol Use: No    Review of Systems  Constitutional: Negative for fever.  HENT: Negative for sore throat.   Eyes: Negative for redness.  Respiratory: Negative for shortness of breath.   Cardiovascular: Negative for chest pain.  Gastrointestinal: Negative for nausea, vomiting and abdominal pain.  Genitourinary: Negative for flank pain.  Musculoskeletal: Negative for back pain and neck pain.  Skin: Negative for rash.  Neurological: Negative for weakness and headaches.  Hematological: Does not bruise/bleed easily.  Psychiatric/Behavioral: Negative for confusion.    Allergies  Review of patient's allergies indicates no known allergies.  Home Medications   Current Outpatient Rx  Name   Route  Sig  Dispense  Refill  . amLODipine (NORVASC) 10 MG tablet   Oral   Take 1 tablet (10 mg total) by mouth daily.   30 tablet   11   . Aspirin-Salicylamide-Caffeine (BC HEADACHE POWDER PO)   Oral   Take 1 packet by mouth daily.         . cloNIDine (CATAPRES) 0.2 MG tablet      Take 1 tablet twice a day .   30 tablet   11   . hydrochlorothiazide (HYDRODIURIL) 25 MG tablet   Oral   Take 1 tablet (25 mg total) by mouth daily.   30 tablet   11   . Multiple Vitamins-Minerals (MULTIVITAMIN WITH MINERALS) tablet   Oral   Take 1 tablet by mouth daily.         Marland Kitchen EXPIRED: sildenafil (VIAGRA) 100 MG tablet   Oral   Take 0.5-1 tablets (50-100 mg total) by mouth daily as needed for erectile dysfunction.   5 tablet   11    BP 152/91  Pulse 90  Temp(Src) 98 F (36.7 C) (Oral)  Resp 24  Ht 6' (1.829 m)  Wt 350 lb (158.759 kg)  BMI 47.46 kg/m2  SpO2 100% Physical Exam  Nursing note and vitals reviewed. Constitutional: He is oriented to person, place, and time. He appears well-developed and well-nourished. No distress.  HENT:  Head: Atraumatic.  Eyes: Conjunctivae are normal.  Neck: Neck supple. No tracheal deviation present.  Cardiovascular: Normal rate, regular rhythm and normal heart sounds.   Pulmonary/Chest: Effort normal  and breath sounds normal. No accessory muscle usage. No respiratory distress.  Abdominal: Soft. Bowel sounds are normal. He exhibits no distension. There is no tenderness.  Obese.   Musculoskeletal: Normal range of motion. He exhibits no edema and no tenderness.  Unable to palpate left fem, pop, dp/pt.  Right fem, dp palp.  Left lower leg/foot mild cooler compared to right.  No swelling. No erythema.   Neurological: He is alert and oriented to person, place, and time.  Motor intact bil.   Skin: Skin is warm and dry. He is not diaphoretic.  Psychiatric: He has a normal mood and affect.    ED Course  Procedures (including critical care  time)  EKG Interpretation   None      Results for orders placed during the hospital encounter of 02/25/13  CBC      Result Value Range   WBC 11.3 (*) 4.0 - 10.5 K/uL   RBC 5.27  4.22 - 5.81 MIL/uL   Hemoglobin 15.6  13.0 - 17.0 g/dL   HCT 16.1  09.6 - 04.5 %   MCV 85.0  78.0 - 100.0 fL   MCH 29.6  26.0 - 34.0 pg   MCHC 34.8  30.0 - 36.0 g/dL   RDW 40.9  81.1 - 91.4 %   Platelets 222  150 - 400 K/uL  PROTIME-INR      Result Value Range   Prothrombin Time 11.7  11.6 - 15.2 seconds   INR 0.87  0.00 - 1.49  TYPE AND SCREEN      Result Value Range   ABO/RH(D) A POS     Antibody Screen NEG     Sample Expiration 02/28/2013     No results found.    MDM  Reviewed nursing notes and prior charts for additional history.   Iv ns. Labs.   Dilaudid iv.   Ecg, pulse ox, monitor.   Unable to doppler left popl, dp/pt - vascular surgery called. Discussed w Dr Darrick Penna. He requests transfer to [facility].  Staff and nurse notified to facilitate carelink transport to [facility] asap.  Labs and vasc dopplers ordered, in event they can be done expeditiously and not delay carelink transports to [facility].  Recheck pt comfortable w meds. Distal pulses left not palp.  Had unit clerk re-page carelink re delay, and to expedite pt transport incl possible ems/911 if further delay - they state on way now.  Dr Darrick Penna called back to check on progress/transport-  Informed [facility] called again and should transport very soon.        Suzi Roots, MD 02/25/13 1320

## 2013-02-25 NOTE — ED Provider Notes (Signed)
Pt transferred from [facility] with cold pulseless L foot to see Dr. Darrick Penna. Resting comfortably, no pulse in L foot. ABI/Vascular study done at [facility] prior to transfer. Dr. Darrick Penna aware.   Charles B. Bernette Mayers, MD 02/25/13 1409

## 2013-02-25 NOTE — ED Notes (Signed)
Patient reports that he began having left leg cramps at 0200 this AM and states he took a teaspoon mustard and the cramps went away and then returned. Patient reports that he was unable to work due to the leg cramps.

## 2013-02-25 NOTE — Progress Notes (Signed)
VASCULAR LAB PRELIMINARY  ARTERIAL  ABI completed:    RIGHT    LEFT    PRESSURE WAVEFORM  PRESSURE WAVEFORM  BRACHIAL 166 Triphasic BRACHIAL 163 Triphasic  DP 199 Triphasic DP 0 Absent     AT 0 Absent  PT Not insonated   PT 0 Absent  PER 197 Biphasic PER 0 Absent           RIGHT LEFT  ABI 1.2 0   Right ABI  Is within normal limits with Doppler waveforms within normal limits. Left ABI could not be ascertained due to absent Doppler signals.  Duplex scan of the left lower extremity revealed dampened monophasic waveforms throughout the thigh with minimal velocities with no significant areas of stenosis. Probable iliac occlusive disease. There appears to be an occlusion of the popliteal artery thrombus versus homogeneous plaque. Thumping Doppler signal. Unable to visualize the anterior tibial, posterior tibial, or peroneal well enough to evaluate. Right common femoral Doppler waveform was triphasic.  Kynzee Devinney, RVS 02/25/2013, 1:25 PM

## 2013-02-25 NOTE — Progress Notes (Signed)
   CARE MANAGEMENT ED NOTE 02/25/2013  Patient:  Carlos Phillips, Carlos Phillips   Account Number:  [id number]  Date Initiated:  02/25/2013  Documentation initiated by:  Edd Arbour  Subjective/Objective Assessment:   47 yr old male with bcbs [place] ppo states he has no pcp     Subjective/Objective Assessment Detail:     Action/Plan:   see below notes   Action/Plan Detail:   Anticipated DC Date:  02/25/2013     Status Recommendation to Physician:   Result of Recommendation:    Other ED Services  Consult Working Plan    DC Planning Services  Other  PCP issues  Outpatient Services - Pt will follow up    Choice offered to / List presented to:            Status of service:  Completed, signed off  ED Comments:   ED Comments Detail:  [facility] ED CM noted pt with coverage but no pcp listed Spoke with pt who confirms no pcp [facility] ED CM spoke with pt on how to obtain an in network pcp with insurance coverage via the customer service number or web site Cm reviewed ED level of care for crisis/emergent services and community pcp level of care to manage continuous or chronic medical concerns.  The pt voiced understanding CM encouraged pt and discussed pt's responsibility to verify with pt's insurance carrier that any recommended medical provider offered by any emergency room or a hospital provider is within the carrier's network. The pt voiced understanding

## 2013-02-25 NOTE — Consult Note (Signed)
[facility] Consult  Reason for Consult:  Cold leg Referring Physician:  ED  [id number]  History of Present Illness: This is a 47 y.o. male who states this morning around 8 am, he got a cramp in his left lower leg.  He states that he took a tablespoon of mustard and it improved briefly, but then the pain returned.  He was then taken to the [facility] ED.  He underwent arterial duplex, which revealed:  Right ABI Is within normal limits with Doppler waveforms within normal limits. Left ABI could not be ascertained due to absent Doppler signals.  Duplex scan of the left lower extremity revealed dampened monophasic waveforms throughout the thigh with minimal velocities with no significant areas of stenosis. Probable iliac occlusive disease. There appears to be an occlusion of the popliteal artery thrombus versus homogeneous plaque. Thumping Doppler signal. Unable to visualize the anterior tibial, posterior tibial, or peroneal well enough to evaluate. Right common femoral Doppler waveform was triphasic. ABI on the right is 1.2.  He states that he does have decreased sensation and slightly decreased movement of left foot.  He states that he does some increased cholesterol (187) and is not on a statin at this time.  He states that he has been tested for diabetes, but it was negative.  He does smoke about a ppd and has smoked since he was 15.  He denies arryhthmia or afib chest pain or shortness of breath.  Denies history of diabetes.  Past Medical History  Diagnosis Date  . Hypertension    History reviewed. No pertinent past surgical history.  No Known Allergies  Prior to Admission medications   Medication Sig Start Date End Date Taking? Authorizing Provider  amLODipine (NORVASC) 10 MG tablet Take 1 tablet (10 mg total) by mouth daily. 05/05/12  Yes Collene Gobble, MD  Aspirin-Salicylamide-Caffeine (BC HEADACHE POWDER PO) Take 1 packet by mouth daily.   Yes Historical Provider, MD   cloNIDine (CATAPRES) 0.2 MG tablet Take 1 tablet twice a day . 05/05/12  Yes Collene Gobble, MD  hydrochlorothiazide (HYDRODIURIL) 25 MG tablet Take 1 tablet (25 mg total) by mouth daily. 05/05/12  Yes Collene Gobble, MD  Multiple Vitamins-Minerals (MULTIVITAMIN WITH MINERALS) tablet Take 1 tablet by mouth daily.   Yes Historical Provider, MD  sildenafil (VIAGRA) 100 MG tablet Take 0.5-1 tablets (50-100 mg total) by mouth daily as needed for erectile dysfunction. 05/05/12 06/04/12  Collene Gobble, MD    Statin:  no Beta Blocker:  no Aspirin:  no ACEI:  no ARB:  no Other antiplatelets/anticoagulants:  no  History   Social History  . Marital Status: Married    Spouse Name: N/A    Number of Children: N/A  . Years of Education: N/A   Occupational History  . Not on file.   Social History Main Topics  . Smoking status: Current Every Day Smoker -- 1.00 packs/day    Types: Cigarettes  . Smokeless tobacco: Never Used  . Alcohol Use: No  . Drug Use: No  . Sexual Activity: Yes   Other Topics Concern  . Not on file   Social History Narrative  . No narrative on file     Family History  Problem Relation Age of Onset  . Diabetes Mother   . Heart disease Father   . Asthma Son     ROS: [x]  Positive   [ ]  Negative   [ ]  All sytems reviewed and are  negative  Cardiovascular: []  chest pain/pressure []  palpitations []  SOB lying flat []  DOE [x]  pain in legs while walking-left [x]  pain in legs at rest-left []  pain in legs at night []  non-healing ulcers []  hx of DVT []  swelling in legs   Pulmonary: []  productive cough []  asthma/wheezing []  home O2  Neurologic: []  weakness in []  arms []  legs []  numbness in []  arms []  legs []  hx of CVA []  mini stroke [] difficulty speaking or slurred speech []  temporary loss of vision in one eye []  dizziness  Hematologic: []  hx of cancer []  bleeding problems []  problems with blood clotting easily  Endocrine:   []  diabetes []  thyroid  disease  GI []  vomiting blood []  blood in stool  GU: []  CKD/renal failure []  HD--[]  M/W/F or []  T/T/S []  burning with urination []  blood in urine  Psychiatric: []  anxiety []  depression  Musculoskeletal: []  arthritis []  joint pain  Integumentary: []  rashes []  ulcers  Constitutional: []  fever []  chills   Physical Examination  Filed Vitals:   02/25/13 1401  BP: 164/86  Pulse: 80  Temp: 98.2 F (36.8 C)  Resp: 19   Body mass index is 47.46 kg/(m^2).  General:  WDWN in NAD Gait: Not observed HENT: WNL, normocephalic Eyes: Pupils equal Pulmonary: normal non-labored breathing, without Rales, rhonchi,  wheezing Cardiac: regular, without  Murmurs, rubs or gallops Abdomen: obese, soft, NT/ND, no masses Skin: without rashes, without ulcers  Vascular Exam/Pulses:no palpable pulses or doppler signals in the left foot; left femoral and popliteal are not palpable; + doppler signal right DP, palable dp on right, absent left foot pulses, absent PT right Extremities:, decreased sensation in LLE to about mid calf;  without Gangrene , without cellulitis; without open wounds;  Musculoskeletal: no muscle wasting or atrophy  Neurologic: A&O X 3; Appropriate Affect ; SENSATION: normal; MOTOR FUNCTION:  moving all extremities equally. Speech is fluent/normal  CBC    Component Value Date/Time   WBC 11.3* 02/25/2013 1210   WBC 8.8 05/05/2012 1153   RBC 5.27 02/25/2013 1210   RBC 4.84 05/05/2012 1153   HGB 15.6 02/25/2013 1210   HGB 14.1 05/05/2012 1153   HCT 44.8 02/25/2013 1210   HCT 44.3 05/05/2012 1153   PLT 222 02/25/2013 1210   MCV 85.0 02/25/2013 1210   MCV 91.5 05/05/2012 1153   MCH 29.6 02/25/2013 1210   MCH 29.1 05/05/2012 1153   MCHC 34.8 02/25/2013 1210   MCHC 31.8 05/05/2012 1153   RDW 14.6 02/25/2013 1210    BMET    Component Value Date/Time   NA 138 02/25/2013 1235   K 3.9 02/25/2013 1235   CL 103 02/25/2013 1235   CO2 23 02/25/2013 1235   GLUCOSE 97 02/25/2013 1235   BUN 17  02/25/2013 1235   CREATININE 0.82 02/25/2013 1235   CREATININE 1.32 05/05/2012 1153   CALCIUM 9.5 02/25/2013 1235   GFRNONAA >90 02/25/2013 1235   GFRAA >90 02/25/2013 1235    COAGS: Lab Results  Component Value Date   INR 0.87 02/25/2013     Non-Invasive Vascular Imaging:  Right ABI Is within normal limits with Doppler waveforms within normal limits. Left ABI could not be ascertained due to absent Doppler signals.  Duplex scan of the left lower extremity revealed dampened monophasic waveforms throughout the thigh with minimal velocities with no significant areas of stenosis. Probable iliac occlusive disease. There appears to be an occlusion of the popliteal artery thrombus versus homogeneous plaque. Thumping Doppler signal. Unable  to visualize the anterior tibial, posterior tibial, or peroneal well enough to evaluate. Right common femoral Doppler waveform was triphasic.   ASSESSMENT: This is a 47 y.o. male with ischemic left leg acute vs acute on chronic.  He does not really have reason for embolic event but could have paroxysmal arrythmia vs acute worsening of chronic occlusive disease left side.  Has most likely iliac occlusion.  Needs CTA for further eval to check inflow especially in light of patient's significant obesity.  Heparin while awaiting CT.  PLAN: -will start pt on heparin gtt per pharmacy -obtain stat CTA with runoff - renal function is normal -pt is not on a statin and will need to be started on one before discharge. -will formulate further plan after results from CTA -Dr. Darrick Penna discussed with pt and family   Doreatha Massed, PA-C [facility] [phone]   History and exam details as above.  Will determine plan based on CT findings.  Fabienne Bruns, MD [facility] [place] Office: [phone] Pager: [phone]

## 2013-02-25 NOTE — Preoperative (Signed)
Beta Blockers   Reason not to administer Beta Blockers:Not Applicable 

## 2013-02-25 NOTE — Anesthesia Procedure Notes (Signed)
Procedure Name: Intubation Date/Time: 02/25/2013 5:25 PM Performed by: Jefm Miles E Pre-anesthesia Checklist: Patient identified, Emergency Drugs available, Suction available, Patient being monitored and Timeout performed Patient Re-evaluated:Patient Re-evaluated prior to inductionOxygen Delivery Method: Circle system utilized Preoxygenation: Pre-oxygenation with 100% oxygen Intubation Type: IV induction, Rapid sequence and Cricoid Pressure applied Laryngoscope Size: Mac and 4 Grade View: Grade I Tube type: Oral Tube size: 7.5 mm Number of attempts: 1 Airway Equipment and Method: Stylet Placement Confirmation: ETT inserted through vocal cords under direct vision,  positive ETCO2 and breath sounds checked- equal and bilateral Secured at: 24 cm Tube secured with: Tape Dental Injury: Teeth and Oropharynx as per pre-operative assessment

## 2013-02-26 DIAGNOSIS — I739 Peripheral vascular disease, unspecified: Secondary | ICD-10-CM

## 2013-02-26 DIAGNOSIS — M79609 Pain in unspecified limb: Secondary | ICD-10-CM

## 2013-02-26 LAB — CBC
HCT: 42 % (ref 39.0–52.0)
Hemoglobin: 14.6 g/dL (ref 13.0–17.0)
MCH: 29.9 pg (ref 26.0–34.0)
MCHC: 34.8 g/dL (ref 30.0–36.0)
MCV: 85.9 fL (ref 78.0–100.0)
Platelets: 234 10*3/uL (ref 150–400)
RBC: 4.89 MIL/uL (ref 4.22–5.81)
RDW: 14.9 % (ref 11.5–15.5)
WBC: 18.6 10*3/uL — ABNORMAL HIGH (ref 4.0–10.5)

## 2013-02-26 LAB — BASIC METABOLIC PANEL
BUN: 9 mg/dL (ref 6–23)
CO2: 23 mEq/L (ref 19–32)
Calcium: 9.1 mg/dL (ref 8.4–10.5)
Chloride: 96 mEq/L (ref 96–112)
Creatinine, Ser: 0.89 mg/dL (ref 0.50–1.35)
GFR calc Af Amer: >90 mL/min (ref >90)
GFR calc non Af Amer: >90 mL/min (ref >90)
Glucose, Bld: 146 mg/dL — ABNORMAL HIGH (ref 70–99)
Potassium: 4 mEq/L (ref 3.5–5.1)
Sodium: 131 mEq/L — ABNORMAL LOW (ref 135–145)

## 2013-02-26 LAB — HEPARIN LEVEL (UNFRACTIONATED): Heparin Unfractionated: 0.37 IU/mL (ref 0.30–0.70)

## 2013-02-26 LAB — HEMOGLOBIN A1C
Hgb A1c MFr Bld: 6.1 % — ABNORMAL HIGH (ref <5.7)
Mean Plasma Glucose: 128 mg/dL — ABNORMAL HIGH (ref <117)

## 2013-02-26 LAB — MRSA PCR SCREENING: MRSA by PCR: NEGATIVE

## 2013-02-26 MED ORDER — DIPHENHYDRAMINE HCL 50 MG/ML IJ SOLN: 12.5000 mg | Freq: Four times a day (QID) | INTRAMUSCULAR | Status: DC | PRN

## 2013-02-26 MED ORDER — NALOXONE HCL 0.4 MG/ML IJ SOLN: 0.4000 mg | INTRAMUSCULAR | Status: DC | PRN

## 2013-02-26 MED ORDER — HYDROMORPHONE 0.3 MG/ML IV SOLN
INTRAVENOUS | Status: AC
  Administered 2013-02-26: 04:00:00
  Filled 2013-02-26: qty 25

## 2013-02-26 MED ORDER — SODIUM CHLORIDE 0.9 % IJ SOLN: 9.0000 mL | INTRAMUSCULAR | Status: DC | PRN

## 2013-02-26 MED ORDER — ONDANSETRON HCL 4 MG/2ML IJ SOLN
4.0000 mg | Freq: Four times a day (QID) | INTRAMUSCULAR | Status: DC | PRN
  Administered 2013-03-03: 4 mg via INTRAVENOUS
  Filled 2013-02-26: qty 2

## 2013-02-26 MED ORDER — HYDROMORPHONE 0.3 MG/ML IV SOLN
INTRAVENOUS | Status: DC
  Administered 2013-02-26: 0.6 mg via INTRAVENOUS
  Administered 2013-02-26: 14:00:00 via INTRAVENOUS
  Administered 2013-02-26: 0.9 mg via INTRAVENOUS
  Administered 2013-02-26: 1.2 mg via INTRAVENOUS
  Administered 2013-02-26: 3.6 mg via INTRAVENOUS
  Administered 2013-02-27: 2.1 mg via INTRAVENOUS
  Administered 2013-02-27: 4.2 mg via INTRAVENOUS
  Administered 2013-02-27: 0.6 mg via INTRAVENOUS
  Administered 2013-02-27: 1.5 mg via INTRAVENOUS
  Administered 2013-02-27: 0.3 mg via INTRAVENOUS
  Administered 2013-02-27: 3.3 mg via INTRAVENOUS
  Administered 2013-02-27 – 2013-02-28 (×2): via INTRAVENOUS
  Administered 2013-02-28: 3.3 mg via INTRAVENOUS
  Administered 2013-02-28: 0.3 mg via INTRAVENOUS
  Administered 2013-02-28: 0.6 mg via INTRAVENOUS
  Administered 2013-03-01: 1.2 mg via INTRAVENOUS
  Administered 2013-03-01: 0.9 mg via INTRAVENOUS
  Administered 2013-03-01: 2.1 mg via INTRAVENOUS
  Filled 2013-02-26 (×4): qty 25

## 2013-02-26 MED ORDER — DIPHENHYDRAMINE HCL 12.5 MG/5ML PO ELIX
12.5000 mg | ORAL_SOLUTION | Freq: Four times a day (QID) | ORAL | Status: DC | PRN
  Filled 2013-02-26: qty 5

## 2013-02-26 MED ORDER — HEPARIN (PORCINE) IN NACL 100-0.45 UNIT/ML-% IJ SOLN
1650.0000 [IU]/h | INTRAMUSCULAR | Status: DC
  Administered 2013-02-27: 1400 [IU]/h via INTRAVENOUS
  Administered 2013-02-28: 1650 [IU]/h via INTRAVENOUS
  Filled 2013-02-26 (×6): qty 250

## 2013-02-26 NOTE — Progress Notes (Signed)
Pt transferred to 2W-10 via bed, belongings and family at bedside. VS stable at time of transfer. RN at bedside to verify PCA and assess LLE dressing old drainage noted. No questions or concerns at this time.  Carlos Phillips L

## 2013-02-26 NOTE — Progress Notes (Signed)
ANTICOAGULATION CONSULT NOTE Pharmacy Consult for heparin Indication: LLE embolus  No Known Allergies  Labs:  Recent Labs  02/25/13 1210 02/25/13 1235 02/25/13 2250 02/26/13 0840 02/26/13 1252  HGB 15.6  --  15.9 14.6  --   HCT 44.8  --  44.9 42.0  --   PLT 222  --  245 234  --   LABPROT 11.7  --   --   --   --   INR 0.87  --   --   --   --   HEPARINUNFRC  --   --  >2.20*  --  0.37  CREATININE  --  0.82 0.93 0.89  --     Estimated Creatinine Clearance: 158.1 ml/min (by C-G formula based on Cr of 0.89).  Assessment: 47 yo male s/p embolectomy for heparin.  Heparin level now therapeutic at 0.37  Goal of Therapy:  Heparin level 0.3-0.7 units/ml Monitor platelets by anticoagulation protocol: Yes   Plan:  1) Continue heparin at 1400 units / hr 2) Follow up AM labs  Thank you. Okey Regal, PharmD [phone]  02/26/2013,2:30 PM

## 2013-02-26 NOTE — Evaluation (Signed)
Occupational Therapy Evaluation Patient Details Name: Carlos Phillips MRN: [id number] DOB: [birth year] Today's Date: 02/26/2013 Time: 4098-1191 OT Time Calculation (min): 42 min  OT Assessment / Plan / Recommendation History of present illness Pt admitted 02/25/13 with clot in Lt. popliteal artery. Pt underwent Left tibial embolectomy with patch angioplasty of left popliteal artery and tibial peroneal trunk, four compartment fasciotomy   Clinical Impression   Pt admitted with above.  He demonstrates the below listed defictis and will benefit from continued OT to maximize safety and independence with BADLs to allow him to return home modified independently with family assisting PRN.  Anticipate good progress.    OT Assessment  Patient needs continued OT Services    Follow Up Recommendations  No OT follow up;Supervision/Assistance - 24 hour    Barriers to Discharge      Equipment Recommendations  3 in 1 bedside comode    Recommendations for Other Services    Frequency  Min 2X/week    Precautions / Restrictions Precautions Precautions: Fall Restrictions Weight Bearing Restrictions: No   Pertinent Vitals/Pain     ADL  Eating/Feeding: Independent Where Assessed - Eating/Feeding: Chair Grooming: Wash/dry hands;Wash/dry face;Teeth care;Brushing hair;Set up Where Assessed - Grooming: Supported sitting Upper Body Bathing: Set up Where Assessed - Upper Body Bathing: Supported sitting;Unsupported sitting Lower Body Bathing: Moderate assistance Where Assessed - Lower Body Bathing: Supported sit to stand Upper Body Dressing: Set up;Supervision/safety Where Assessed - Upper Body Dressing: Unsupported sitting Lower Body Dressing: Maximal assistance Where Assessed - Lower Body Dressing: Supported sit to stand Toilet Transfer: Minimal assistance Toilet Transfer Method: Sit to stand;Stand pivot Acupuncturist: Bedside commode Toileting - Clothing Manipulation and Hygiene:  Moderate assistance Where Assessed - Toileting Clothing Manipulation and Hygiene: Standing Transfers/Ambulation Related to ADLs: min guard assist for stand pivot transfer    OT Diagnosis: Generalized weakness;Acute pain  OT Problem List: Decreased strength;Decreased activity tolerance;Decreased knowledge of use of DME or AE;Decreased knowledge of precautions;Pain OT Treatment Interventions: Self-care/ADL training;DME and/or AE instruction;Therapeutic activities;Patient/family education   OT Goals(Current goals can be found in the care plan section) Acute Rehab OT Goals Patient Stated Goal: go home; eventual return to work OT Goal Formulation: With patient Time For Goal Achievement: 03/05/13 Potential to Achieve Goals: Good ADL Goals Pt Will Perform Grooming: with modified independence;standing Pt Will Perform Lower Body Bathing: with modified independence;sit to/from stand Pt Will Perform Lower Body Dressing: with modified independence;sit to/from stand Pt Will Transfer to Toilet: with modified independence;ambulating;regular height toilet;bedside commode;grab bars Pt Will Perform Toileting - Clothing Manipulation and hygiene: with modified independence;sit to/from stand Pt Will Perform Tub/Shower Transfer: Shower transfer;with modified independence;rolling walker;3 in 1;shower seat;ambulating  Visit Information  Last OT Received On: 02/26/13 Assistance Needed: +2 (lines) PT goals addressed during session: Mobility/safety with mobility;Proper use of DME;Strengthening/ROM;Other (comment) (edema control) History of Present Illness: Pt admitted 02/25/13 with clot in Lt. popliteal artery. Pt underwent Left tibial embolectomy with patch angioplasty of left popliteal artery and tibial peroneal trunk, four compartment fasciotomy       Prior Functioning     Home Living Family/patient expects to be discharged to:: Private residence Living Arrangements: Spouse/significant other;Children (dtr  24; son 38) Available Help at Discharge: Available 24 hours/day;Family Type of Home: House Home Access: Stairs to enter [employer] of Steps: 6-7 Entrance Stairs-Rails: Left;Right;Can reach both Home Layout: One level Home Equipment: None Prior Function Level of Independence: Independent 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  Dominant Hand: Right         Vision/Perception     Cognition  Cognition Arousal/Alertness: Awake/alert Behavior During Therapy: WFL for tasks assessed/performed Overall Cognitive Status: Within Functional Limits for tasks assessed    Extremity/Trunk Assessment Upper Extremity Assessment Upper Extremity Assessment: Overall WFL for tasks assessed Lower Extremity Assessment Lower Extremity Assessment: Defer to PT evaluation LLE Deficits / Details: AROM diminished due to pain; able to activate all muscle groups; hip flexion to t least 90, knee flexion to 70, ankle DF to 0 (all limited by pain/edema) LLE Sensation: decreased light touch (but improved compared to pre-op; was WNL prior to occlusion) Cervical / Trunk Assessment Cervical / Trunk Assessment: Normal     Mobility Bed Mobility Bed Mobility: Supine to Sit;Sitting - Scoot to Edge of Bed Supine to Sit: 1: +2 Total assist;HOB elevated Supine to Sit: Patient Percentage: 70% Sitting - Scoot to Edge of Bed: 4: Min assist Details for Bed Mobility Assistance: assist to raise torso while also assisting movement/support of LLE (due to pain); assist to scoot EOB to support LLE Transfers Sit to Stand: 4: Min guard;From bed Stand to Sit: 4: Min assist Details for Transfer Assistance: ; vc for sequencing and safe use of RW; to sit required assist to advance LLE to prevent excessive flexion     Exercise General Exercises - Lower Extremity Ankle Circles/Pumps: AROM;Left;10 reps;Seated Quad Sets: AROM;Left;5 reps;Seated   Balance Balance Balance  Assessed: Yes Static Sitting Balance Static Sitting - Balance Support: No upper extremity supported;Feet supported Static Sitting - Level of Assistance: 7: Independent Static Standing Balance Static Standing - Balance Support: Bilateral upper extremity supported Static Standing - Level of Assistance: 4: Min assist Static Standing - Comment/# of Minutes: use of RW due to pt's inability to tolerate weight bearing in LLE   End of Session OT - End of Session Equipment Utilized During Treatment: Rolling walker Activity Tolerance: Patient tolerated treatment well Patient left: in chair;with call bell/phone within reach Nurse Communication: Mobility status  GO     Maisyn Nouri M 02/26/2013, 2:56 PM

## 2013-02-26 NOTE — Progress Notes (Signed)
ANTICOAGULATION CONSULT NOTE Pharmacy Consult for heparin Indication: LLE embolus  No Known Allergies  Patient Measurements: Height: 6' (182.9 cm) Weight: 350 lb (158.759 kg) IBW/kg (Calculated) : 77.6 Heparin Dosing Weight: 104.6  Vital Signs: Temp: 98.6 F (37 C) (12/01 2300) Temp src: Oral (12/01 2300) BP: 157/93 mmHg (12/01 2300) Pulse Rate: 98 (12/01 2300)  Labs:  Recent Labs  02/25/13 1210 02/25/13 1235 02/25/13 2250  HGB 15.6  --  15.9  HCT 44.8  --  44.9  PLT 222  --  245  LABPROT 11.7  --   --   INR 0.87  --   --   HEPARINUNFRC  --   --  >2.20*  CREATININE  --  0.82 0.93    Estimated Creatinine Clearance: 151.3 ml/min (by C-G formula based on Cr of 0.93).  Assessment: 47 yo male s/p embolectomy for heparin.  Received 20,000 units IV heparin in OR between 1830-1930 in additional to continuous infusion of 1400 units/hr.   Heparin level drawn appropriately from opposite arm.  Goal of Therapy:  Heparin level 0.3-0.7 units/ml Monitor platelets by anticoagulation protocol: Yes   Plan:  Will hold heparin x 3 hours to give bolus time to clear, then will restart heparin 16109 units/hr Check heparin level later this morning.  Eddie Candle 02/26/2013,12:04 AM

## 2013-02-26 NOTE — Evaluation (Signed)
Physical Therapy Evaluation Patient Details Name: Carlos Phillips MRN: [id number] DOB: [birth year] Today's Date: 02/26/2013 Time: 4098-1191 PT Time Calculation (min): 42 min  PT Assessment / Plan / Recommendation History of Present Illness  Pt admitted 02/25/13 with clot in Lt. popliteal artery. Pt underwent Left tibial embolectomy with patch angioplasty of left popliteal artery and tibial peroneal trunk, four compartment fasciotomy  Clinical Impression  Patient is s/p above surgery resulting in functional limitations due to the deficits listed below (see PT Problem List).  Patient will benefit from skilled PT to increase their independence and safety with mobility to allow discharge to the venue listed below.         PT Assessment  Patient needs continued PT services    Follow Up Recommendations  No PT follow up (? OPPT for work hardening)    Does the patient have the potential to tolerate intense rehabilitation      Barriers to Discharge Inaccessible home environment 7 steps to enter home    Equipment Recommendations  Rolling walker with 5" wheels (may progress to crutches)    Recommendations for Other Services     Frequency Min 5X/week    Precautions / Restrictions Restrictions Weight Bearing Restrictions: No   Pertinent Vitals/Pain HR 90-153 per telemetry; RN aware; ?pain      Mobility  Bed Mobility Bed Mobility: Supine to Sit;Sitting - Scoot to Edge of Bed Supine to Sit: 1: +2 Total assist;HOB elevated Supine to Sit: Patient Percentage: 70% Sitting - Scoot to Edge of Bed: 4: Min assist Details for Bed Mobility Assistance: assist to raise torso while also assisting movement/support of LLE (due to pain); assist to scoot EOB to support LLE Transfers Transfers: Sit to Stand;Stand to Sit Sit to Stand: 4: Min guard;From bed Stand to Sit: 4: Min assist Details for Transfer Assistance: verbal cues for safe hand placement; vc for sequencing and safe use of RW; to sit  required assist to advance LLE to prevent excessive flexion Ambulation/Gait Ambulation/Gait Assistance: 4: Min guard Ambulation Distance (Feet): 2 Feet Assistive device: 4-wheeled walker Ambulation/Gait Assistance Details: limited distance due to wounds oozing earlier and per discussion with [facility] PA to perform bed to chair only today Gait Pattern: Step-to pattern;Decreased stride length;Antalgic (pt self-selecting TDWB on LLE due to pain)    Exercises General Exercises - Lower Extremity Ankle Circles/Pumps: AROM;Left;10 reps;Seated Quad Sets: AROM;Left;5 reps;Seated   PT Diagnosis: Difficulty walking;Acute pain  PT Problem List: Decreased strength;Decreased range of motion;Decreased activity tolerance;Decreased balance;Decreased mobility;Decreased knowledge of use of DME;Cardiopulmonary status limiting activity;Obesity;Pain PT Treatment Interventions: DME instruction;Gait training;Stair training;Functional mobility training;Therapeutic activities;Therapeutic exercise;Patient/family education     PT Goals(Current goals can be found in the care plan section) Acute Rehab PT Goals Patient Stated Goal: go home; eventual return to work PT Goal Formulation: With patient Time For Goal Achievement: 03/04/13 Potential to Achieve Goals: Good  Visit Information  Last PT Received On: 02/26/13 Assistance Needed: +2 (lines) PT/OT/SLP Co-Evaluation/Treatment: Yes PT goals addressed during session: Mobility/safety with mobility;Proper use of DME;Strengthening/ROM;Other (comment) (edema control) History of Present Illness: Pt admitted 02/25/13 with clot in Lt. popliteal artery. Pt underwent Left tibial embolectomy with patch angioplasty of left popliteal artery and tibial peroneal trunk, four compartment fasciotomy       Prior Functioning  Home Living Family/patient expects to be discharged to:: Private residence Living Arrangements: Spouse/significant other;Children (dtr 24; son 24) Available Help  at Discharge: Available 24 hours/day;Family Type of Home: House Home Access: Stairs to enter Entrance  Stairs-Number of Steps: 6-7 Entrance Stairs-Rails: Left;Right;Can reach both Home Layout: One level Home Equipment: None Prior Function Level of Independence: Independent 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 Dominant Hand: Right    Cognition  Cognition Arousal/Alertness: Awake/alert Behavior During Therapy: WFL for tasks assessed/performed Overall Cognitive Status: Within Functional Limits for tasks assessed    Extremity/Trunk Assessment Upper Extremity Assessment Upper Extremity Assessment: Defer to OT evaluation;Overall [facility] for tasks assessed Lower Extremity Assessment Lower Extremity Assessment: LLE deficits/detail LLE Deficits / Details: AROM diminished due to pain; able to activate all muscle groups; hip flexion to t least 90, knee flexion to 70, ankle DF to 0 (all limited by pain/edema) LLE Sensation: decreased light touch (but improved compared to pre-op; was WNL prior to occlusion) Cervical / Trunk Assessment Cervical / Trunk Assessment: Normal   Balance Balance Balance Assessed: Yes Static Sitting Balance Static Sitting - Balance Support: No upper extremity supported;Feet supported Static Sitting - Level of Assistance: 7: Independent Static Standing Balance Static Standing - Balance Support: Bilateral upper extremity supported Static Standing - Level of Assistance: 4: Min assist Static Standing - Comment/# of Minutes: use of RW due to pt's inability to tolerate weight bearing in LLE  End of Session PT - End of Session Activity Tolerance: Patient limited by pain Patient left: in chair;with family/visitor present  GP     Carlos Phillips 02/26/2013, 2:44 PM Pager [phone]

## 2013-02-26 NOTE — Progress Notes (Addendum)
[facility] Progress Note  02/26/2013 7:25 AM 1 Day Post-Op  Subjective:  States his foot and leg feel better this am and is less tight.  Afebrile HR 70's-120's 110's-170's systolic 97% 2LO2NC  Filed Vitals:   02/26/13 0700  BP: 116/94  Pulse: 117  Temp:   Resp: 19    Physical Exam: Incisions:  Constant bloody ooze from distal portion of medial incision-staples in tact.  Lateral fasciotomy wound:  Muscle is beefy red with minimal bloody drainage Extremities:  + doppler signal in left PT/DP/peroneal;  Sensation is in tact of left toes; motor is in tact left foot and left lower extremity.  CBC    Component Value Date/Time   WBC 18.6* 02/26/2013 0840   WBC 8.8 05/05/2012 1153   RBC 4.89 02/26/2013 0840   RBC 4.84 05/05/2012 1153   HGB 14.6 02/26/2013 0840   HGB 14.1 05/05/2012 1153   HCT 42.0 02/26/2013 0840   HCT 44.3 05/05/2012 1153   PLT 234 02/26/2013 0840   MCV 85.9 02/26/2013 0840   MCV 91.5 05/05/2012 1153   MCH 29.9 02/26/2013 0840   MCH 29.1 05/05/2012 1153   MCHC 34.8 02/26/2013 0840   MCHC 31.8 05/05/2012 1153   RDW 14.9 02/26/2013 0840   BMET    Component Value Date/Time   NA 131* 02/26/2013 0840   K 4.0 02/26/2013 0840   CL 96 02/26/2013 0840   CO2 23 02/26/2013 0840   GLUCOSE 146* 02/26/2013 0840   BUN 9 02/26/2013 0840   CREATININE 0.89 02/26/2013 0840   CREATININE 1.32 05/05/2012 1153   CALCIUM 9.1 02/26/2013 0840   GFRNONAA >90 02/26/2013 0840   GFRAA >90 02/26/2013 0840      INR    Component Value Date/Time   INR 0.87 02/25/2013 1210     Intake/Output Summary (Last 24 hours) at 02/26/13 0725 Last data filed at 02/26/13 0700  Gross per 24 hour  Intake 2707.88 ml  Output   3000 ml  Net -292.12 ml     Assessment:  47 y.o. male is s/p:  Left tibial embolectomy with patch angioplasty of left popliteal artery and tibial peroneal trunk, four compartment fasciotomy  1 Day Post-Op  Plan: -pt doing well this am with audible doppler signals left  foot. -some bloody drainage from medial incision-labs not back this am-will check hgb, bmet, hgbA1c -DVT prophylaxis:  Heparin gtt-continue -will need cardiology workup-will order echo -advance diet   Doreatha Massed, PA-C [facility] [phone] 02/26/2013 7:25 AM    Addendum:   POD 1 ABI's as follows:  RIGHT    LEFT     PRESSURE  WAVEFORM   PRESSURE  WAVEFORM   BRACHIAL  122  biphasic  BRACHIAL  120  triphasic   DP  124  triphasic  DP     AT    AT     PT   absent  PT  89  monophasic   PER   absent  PER  95  monophasic   GREAT TOE   adequate  GREAT TOE   flat     RIGHT  LEFT   ABI  >1.0  0.78     -Labs from today reveal mild acute surgical blood anemia-pt tolerating. -will transfer to floor -Echo completed-awaiting results -check CBC in am -BUN/Cr WNL with good UOP  -mobilize up to chair -wet to dry dressing changes bid to fasciotomy wound   RHYNE, SAMANTHA 02/26/2013 1:42 PM  Still with some numbness in foot but improving  Fasciotomy still oozing but viable muscle significant swelling able to flex and extend ankle Good PT doppler Arteriogram most likely external iliac stent on Thursday by Dr Imogene Burn Continue heparin until then [facility] fasciotomy tomorrow if no further bleeding  Fabienne Bruns, MD [facility] [place] Office: [phone] Pager: [phone]

## 2013-02-26 NOTE — Progress Notes (Signed)
Report given to [place], RN Axiel Fjeld L

## 2013-02-26 NOTE — Progress Notes (Signed)
  Echocardiogram 2D Echocardiogram has been performed.  Carlos Phillips 02/26/2013, 9:25 AM

## 2013-02-26 NOTE — Progress Notes (Signed)
VASCULAR LAB PRELIMINARY  ARTERIAL  ABI completed:    RIGHT    LEFT    PRESSURE WAVEFORM  PRESSURE WAVEFORM  BRACHIAL 122 biphasic BRACHIAL 120 triphasic  DP 124 triphasic DP    AT   AT    PT  absent PT 89 monophasic  PER  absent PER 95 monophasic  GREAT TOE  adequate GREAT TOE  flat    RIGHT LEFT  ABI >1.0 0.78     Leeah Politano, RVT 02/26/2013, 9:54 AM

## 2013-02-26 NOTE — Anesthesia Postprocedure Evaluation (Signed)
Anesthesia Post Note  Patient: Carlos Phillips  Procedure(s) Performed: Procedure(s) (LRB): Left Popliteal EMBOLECTOMY Poss fasciotomy (Left) ANGIOGRAM EXTREMITY LEFT (Left)  Anesthesia type: general  Patient location: PACU  Post pain: Pain level controlled  Post assessment: Patient's Cardiovascular Status Stable  Last Vitals:  Filed Vitals:   02/26/13 0000  BP: 159/80  Pulse: 100  Temp:   Resp: 22    Post vital signs: Reviewed and stable  Level of consciousness: sedated  Complications: No apparent anesthesia complications

## 2013-02-27 ENCOUNTER — Encounter (HOSPITAL_COMMUNITY): Payer: Self-pay | Admitting: Vascular Surgery

## 2013-02-27 LAB — CBC
HCT: 38.2 % — ABNORMAL LOW (ref 39.0–52.0)
HCT: 39.1 % (ref 39.0–52.0)
Hemoglobin: 12.9 g/dL — ABNORMAL LOW (ref 13.0–17.0)
Hemoglobin: 13.4 g/dL (ref 13.0–17.0)
MCH: 29.5 pg (ref 26.0–34.0)
MCH: 30 pg (ref 26.0–34.0)
MCHC: 33.8 g/dL (ref 30.0–36.0)
MCHC: 34.3 g/dL (ref 30.0–36.0)
MCV: 87.4 fL (ref 78.0–100.0)
MCV: 87.7 fL (ref 78.0–100.0)
Platelets: 230 10*3/uL (ref 150–400)
Platelets: 250 10*3/uL (ref 150–400)
RBC: 4.37 MIL/uL (ref 4.22–5.81)
RBC: 4.46 MIL/uL (ref 4.22–5.81)
RDW: 15.3 % (ref 11.5–15.5)
RDW: 15.2 % (ref 11.5–15.5)
WBC: 16.1 10*3/uL — ABNORMAL HIGH (ref 4.0–10.5)
WBC: 15.1 10*3/uL — ABNORMAL HIGH (ref 4.0–10.5)

## 2013-02-27 LAB — HEPARIN LEVEL (UNFRACTIONATED)
Heparin Unfractionated: 0.2 IU/mL — ABNORMAL LOW (ref 0.30–0.70)
Heparin Unfractionated: 0.5 IU/mL (ref 0.30–0.70)

## 2013-02-27 MED ORDER — ASPIRIN 325 MG PO TABS
325.0000 mg | ORAL_TABLET | Freq: Every day | ORAL | Status: DC
  Administered 2013-02-27 – 2013-03-04 (×6): 325 mg via ORAL
  Filled 2013-02-27 (×6): qty 1

## 2013-02-27 NOTE — Progress Notes (Signed)
ANTICOAGULATION CONSULT NOTE - Follow Up Consult  Pharmacy Consult for heparin Indication: ischemic foot  No Known Allergies  Patient Measurements: Height: 6' (182.9 cm) Weight: 350 lb (158.759 kg) IBW/kg (Calculated) : 77.6 Heparin Dosing Weight: 115.5kg  Vital Signs: Temp: 97.8 F (36.6 C) (12/03 2031) Temp src: Oral (12/03 2031) BP: 156/70 mmHg (12/03 2031) Pulse Rate: 96 (12/03 2031)  Labs:  Recent Labs  02/25/13 1210 02/25/13 1235  02/25/13 2250 02/26/13 0840 02/26/13 1252 02/27/13 0532 02/27/13 0943 02/27/13 2120  HGB 15.6  --   --  15.9 14.6  --  12.9* 13.4  --   HCT 44.8  --   --  44.9 42.0  --  38.2* 39.1  --   PLT 222  --   --  245 234  --  230 250  --   LABPROT 11.7  --   --   --   --   --   --   --   --   INR 0.87  --   --   --   --   --   --   --   --   HEPARINUNFRC  --   --   < > >2.20*  --  0.37  --  0.20* 0.50  CREATININE  --  0.82  --  0.93 0.89  --   --   --   --   < > = values in this interval not displayed.  Estimated Creatinine Clearance: 158.1 ml/min (by C-G formula based on Cr of 0.89).   Medications:  Infusions:  . heparin 1,650 Units/hr (02/27/13 1410)    Assessment: 47 yom s/p embolectomy continues on IV heparin. Heparin level was low early this afternoon and rate was increased to 1650 units/hr. A heparin level drawn after that increase is therapeutic at 0.5 units/mL. Noted patient had bloody drainage from wound but no excessive bleeding. CBC was WNL this morning.  Goal of Therapy:  Heparin level 0.3-0.7 units/ml Monitor platelets by anticoagulation protocol: Yes   Plan:  1. Continue heparin gtt at 1650 units/hr 2. Daily heparin level and CBC- next level will be checked with AM labs 3. Follow for long term plans and any excessive bleeding   Yovana Scogin D. Roshell Brigham, PharmD, BCPS Clinical Pharmacist Pager: [phone] 02/27/2013 9:49 PM

## 2013-02-27 NOTE — Progress Notes (Addendum)
  VASCULAR SURGERY BYPASS PROGRESS NOTE   2 Days Post-Op  SUBJECTIVE: pt doing well. Using PCA and is comfortable. Pt states he has return of sensation in the left leg but it is still not quite normal.  PHYSICAL EXAM: BP Readings from Last 3 Encounters:  02/27/13 131/79  02/27/13 131/79  05/05/12 106/64   Temp Readings from Last 3 Encounters:  02/27/13 98.3 F (36.8 C) Oral  02/27/13 98.3 F (36.8 C) Oral  05/05/12 98.4 F (36.9 C) Oral   Pulse Readings from Last 3 Encounters:  02/27/13 80  02/27/13 80  05/05/12 63   SpO2 Readings from Last 3 Encounters:  02/27/13 100%  02/27/13 100%  05/05/12 100%     Intake/Output Summary (Last 24 hours) at 02/27/13 0749 Last data filed at 02/27/13 0700  Gross per 24 hour  Intake 1484.3 ml  Output   2550 ml  Net -1065.7 ml    Extremities: Incisions clean, dry and intact Mod serosanguinous drainage from lateral compartment  Pulse status palp DP on right,  left foot warm with good motion. Slight decrease in sensation as compared to right   LABS: Lab Results  Component Value Date   WBC 16.1* 02/27/2013   HGB 12.9* 02/27/2013   HCT 38.2* 02/27/2013   MCV 87.4 02/27/2013   PLT 230 02/27/2013   Lab Results  Component Value Date   CREATININE 0.89 02/26/2013   Lab Results  Component Value Date   INR 0.87 02/25/2013       ASSESSMENT: 2 Days Post-Op s/p Left tibial embolectomy with patch angioplasty of left popliteal artery and tibial peroneal trunk, four compartment fasciotomy  Pt doing well. Drainage from lateral compartment - ? Vac vs dressing changes today PLAN: Ambulate Wound Management: change dressing to fasciotomies Cont on heparin drip - ? Transfer to coumadin if no further surgery needed PT/OT If pain better controlled in am will DC PCA   Left foot viable.  Less numbness.  Lateral fasciotomy wound still some ooze.  Agree with above.  Will transition to [facility] when ooze stops.  Most likely will not need coumadin.   Exteranal Iliac probable stent tomorrow.  Most likely ruptured plaque was the source.  Continue heparin for now will hold at 6 am tomorrow.  May not need it after arteriogram.   Fabienne Bruns, MD [facility] [place] Office: [phone] Pager: [phone]

## 2013-02-27 NOTE — Care Management Note (Signed)
    Page 1 of 2   03/04/2013     4:35:25 PM   CARE MANAGEMENT NOTE 03/04/2013  Patient:  Carlos Phillips, Carlos Phillips   Account Number:  [id number]  Date Initiated:  02/27/2013  Documentation initiated by:  Meir Elwood  Subjective/Objective Assessment:   PT S/P LT POPLITEAL EMBOLECTOMY AND OPEN FASCIOTOMY N 02/25/13.  PTA, PT INDEPENDENT, LIVES WITH SPOUSE.     Action/Plan:   WILL FOLLOW FOR DC NEEDS AS PT PROGRESSES.   Anticipated DC Date:  03/04/2013   Anticipated DC Plan:  HOME W HOME HEALTH SERVICES      DC Planning Services  CM consult      [facility] Choice  HOME HEALTH   Choice offered to / List presented to:  C-1 Patient   DME arranged  VAC  [facility] ROLLING      DME agency  [employer]  [facility]     [facility] arranged  HH-1 RN      [facility] agency  [facility]   Status of service:  Completed, signed off Medicare Important Message given?   (If response is "NO", the following Medicare IM given date fields will be blank) Date Medicare IM given:   Date Additional Medicare IM given:    Discharge Disposition:  HOME W HOME HEALTH SERVICES  Per UR Regulation:  Reviewed for med. necessity/level of care/duration of stay  If discussed at Long Length of Stay Meetings, dates discussed:    Comments:  03/04/13 Dua Mehler,RN,BSN [phone] PT FOR DC HOME TODAY AND WILL NEED HOME VAC.  FAXED REFERRAL TO [facility] WITH [employer] AT [phone].  RECEIVED TELEPHONE CALL FROM [employer] APPROX 1HR LATER STATING THAT VAC WOULD BE DELIVERED BEFORE 5PM.  PT STATES HE WILL NEED RW FOR HOME.  REQUESTED DME FROM [facility].  NOTIFIED [facility] OF DC HOME TODAY.  03-01-13 - 10:25am Avie Arenas, RNBSN [phone] Wound VAC to be placed today.  Paperwork on chart signed but no measurement - will need to get when [facility] placed. Nurse updated.  Talked with patient about HH - requested [facility] - referral made.  [employer] called and paperwork faxed as is to start process.  Goal is to have by 12-6.  MD needs to resign presription for  [facility] in correct spot with NPI number.  Will place another one on chart as now patient may not need on dc as may close next week prior to going home.  Notified PA of need for physician signature. Will place sticky in epic for reminder this w/e.  CM will continue to follow.  02/28/13 Kyion Gautier,RN,BSN [phone] VAC PLACED TODAY.  MD, PLEASE SIGN WOUND VAC FORMS IN SHADOW CHART IN [place], IF HOME VAC WILL BE NEEDED. THANKS.

## 2013-02-27 NOTE — Progress Notes (Signed)
Hydromorphone PCA syringe replaced. 2 mL left in syringe. Witnessed waste with Caryl Comes, RN. Will continue to monitor pt closely.

## 2013-02-27 NOTE — Progress Notes (Signed)
Physical Therapy Treatment Patient Details Name: Carlos Phillips MRN: [id number] DOB: September 20, 1965 Today's Date: 02/27/2013 Time: 4098-1191 PT Time Calculation (min): 29 min  PT Assessment / Plan / Recommendation  History of Present Illness Pt admitted 02/25/13 with clot in Lt. popliteal artery. Pt underwent Left tibial embolectomy with patch angioplasty of left popliteal artery and tibial peroneal trunk, four compartment fasciotomy   PT Comments   Pt able to increase ambulation distance with improved gait sequence this session.  Pt very motivated and continues to make great progress.    Follow Up Recommendations  No PT follow up     Does the patient have the potential to tolerate intense rehabilitation     Barriers to Discharge        Equipment Recommendations  Rolling walker with 5" wheels    Recommendations for Other Services    Frequency Min 5X/week   Progress towards PT Goals Progress towards PT goals: Progressing toward goals  Plan Current plan remains appropriate    Precautions / Restrictions Precautions Precautions: Fall Restrictions Weight Bearing Restrictions: No   Pertinent Vitals/Pain 6/10 left LE pain; PCA    Mobility  Bed Mobility Bed Mobility: Not assessed (sitting in recliner on arrival. ) Transfers Transfers: Sit to Stand;Stand to Sit Sit to Stand: 4: Min guard;From chair/3-in-1;With armrests Stand to Sit: 4: Min guard;With armrests;To chair/3-in-1 Details for Transfer Assistance: Minguard for safety with cues for hand placement Ambulation/Gait Ambulation/Gait Assistance: 4: Min guard Ambulation Distance (Feet): 80 Feet Assistive device: Rolling walker Ambulation/Gait Assistance Details: Cues for step sequence and able to progress from step to gait to step through gait pattern.  Gait Pattern: Step-through pattern;Decreased stride length;Decreased stance time - left;Shuffle;Antalgic Gait velocity: decreased due to pain    Exercises General Exercises  - Lower Extremity Heel Slides: AAROM;Left;5 reps (educated on HS sitting for incr DF ROM w/ towel underneath)   PT Diagnosis:    PT Problem List:   PT Treatment Interventions:     PT Goals (current goals can now be found in the care plan section) Acute Rehab PT Goals Patient Stated Goal: go home; eventual return to work PT Goal Formulation: With patient Time For Goal Achievement: 03/04/13 Potential to Achieve Goals: Good  Visit Information  Last PT Received On: 02/27/13 Assistance Needed: +1 History of Present Illness: Pt admitted 02/25/13 with clot in Lt. popliteal artery. Pt underwent Left tibial embolectomy with patch angioplasty of left popliteal artery and tibial peroneal trunk, four compartment fasciotomy    Subjective Data  Subjective: "I'm ready to get moving." Patient Stated Goal: go home; eventual return to work   Cognition  Cognition Arousal/Alertness: Awake/alert Behavior During Therapy: WFL for tasks assessed/performed Overall Cognitive Status: Within Functional Limits for tasks assessed    Balance     End of Session PT - End of Session Activity Tolerance: Patient tolerated treatment well Patient left: in chair;with family/visitor present Nurse Communication: Mobility status   GP     Ersilia Phillips 02/27/2013, 12:16 PM  Jake Shark, PT DPT [phone]

## 2013-02-27 NOTE — Progress Notes (Signed)
ANTICOAGULATION CONSULT NOTE - Follow Up Consult  Pharmacy Consult for heparin Indication: ischemic foot  No Known Allergies  Patient Measurements: Height: 6' (182.9 cm) Weight: 350 lb (158.759 kg) IBW/kg (Calculated) : 77.6 Heparin Dosing Weight: 115.5kg  Vital Signs: Temp: 98.3 F (36.8 C) (12/03 0402) Temp src: Oral (12/03 0402) BP: 135/84 mmHg (12/03 1118) Pulse Rate: 80 (12/03 0402)  Labs:  Recent Labs  02/25/13 1210 02/25/13 1235 02/25/13 2250 02/26/13 0840 02/26/13 1252 02/27/13 0532 02/27/13 0943  HGB 15.6  --  15.9 14.6  --  12.9* 13.4  HCT 44.8  --  44.9 42.0  --  38.2* 39.1  PLT 222  --  245 234  --  230 250  LABPROT 11.7  --   --   --   --   --   --   INR 0.87  --   --   --   --   --   --   HEPARINUNFRC  --   --  >2.20*  --  0.37  --  0.20*  CREATININE  --  0.82 0.93 0.89  --   --   --     Estimated Creatinine Clearance: 158.1 ml/min (by C-G formula based on Cr of 0.89).   Medications:  Infusions:  . heparin 1,400 Units/hr (02/27/13 1002)    Assessment: 47 yom s/p embolectomy continues on IV heparin. Heparin level is now low at 0.2. CBC is stable, no bleeding noted.   Goal of Therapy:  Heparin level 0.3-0.7 units/ml Monitor platelets by anticoagulation protocol: Yes   Plan:  1. Increase heparin gtt to 1650 units/hr 2. Check an 8 hour heparin level 3. Continue daily heparin level and CBC  Ireland Chagnon, Drake Leach 02/27/2013,12:42 PM

## 2013-02-27 NOTE — Progress Notes (Signed)
Leg dressing changed because of soiling. Bed pad had blood present. Medial site was intact with staples.  Small amount of bloody drainage. Lateral fasciotomy site had unattached edges with granulation tissue present in the wound bed. Moderate amount of bloody drainage from site.  Wet to dry dressing reapplied with gauze and ABD pads. Dressings wrapped in Kerlex.

## 2013-02-27 NOTE — Progress Notes (Signed)
Leg dressing changed because of soiling. Bed pad had blood present. Medial site was intact with staples.  Small amount of bloody drainage. Lateral fasciotomy site had unattached edges with granulation tissue present in the wound bed. Moderate amount of bloody drainage from site.  Wet to dry dressing reapplied with gauze and ABD pads. Dressings wrapped in Kerlex.  Will continue to monitor pt closely.

## 2013-02-28 ENCOUNTER — Encounter (HOSPITAL_COMMUNITY)
Admission: EM | Disposition: A | Discharge status: Home Health | Payer: Self-pay | Source: Home / Self Care | Attending: Vascular Surgery

## 2013-02-28 DIAGNOSIS — I70209 Unspecified atherosclerosis of native arteries of extremities, unspecified extremity: Secondary | ICD-10-CM

## 2013-02-28 HISTORY — PX: ABDOMINAL AORTAGRAM: SHX5454

## 2013-02-28 LAB — BASIC METABOLIC PANEL
BUN: 17 mg/dL (ref 6–23)
CO2: 24 mEq/L (ref 19–32)
Calcium: 8.9 mg/dL (ref 8.4–10.5)
Chloride: 96 mEq/L (ref 96–112)
Creatinine, Ser: 1.19 mg/dL (ref 0.50–1.35)
GFR calc Af Amer: 82 mL/min — ABNORMAL LOW (ref >90)
GFR calc non Af Amer: 71 mL/min — ABNORMAL LOW (ref >90)
Glucose, Bld: 115 mg/dL — ABNORMAL HIGH (ref 70–99)
Potassium: 3.8 mEq/L (ref 3.5–5.1)
Sodium: 133 mEq/L — ABNORMAL LOW (ref 135–145)

## 2013-02-28 LAB — CBC
HCT: 35.4 % — ABNORMAL LOW (ref 39.0–52.0)
Hemoglobin: 12.1 g/dL — ABNORMAL LOW (ref 13.0–17.0)
MCH: 29.7 pg (ref 26.0–34.0)
MCHC: 34.2 g/dL (ref 30.0–36.0)
MCV: 86.8 fL (ref 78.0–100.0)
Platelets: 234 10*3/uL (ref 150–400)
RBC: 4.08 MIL/uL — ABNORMAL LOW (ref 4.22–5.81)
RDW: 14.9 % (ref 11.5–15.5)
WBC: 12.8 10*3/uL — ABNORMAL HIGH (ref 4.0–10.5)

## 2013-02-28 LAB — HEPARIN LEVEL (UNFRACTIONATED): Heparin Unfractionated: 0.53 IU/mL (ref 0.30–0.70)

## 2013-02-28 LAB — POCT ACTIVATED CLOTTING TIME
Activated Clotting Time: 247 seconds
Activated Clotting Time: 217 seconds
Activated Clotting Time: 186 seconds

## 2013-02-28 SURGERY — ABDOMINAL AORTAGRAM
Anesthesia: LOCAL

## 2013-02-28 MED ORDER — SODIUM CHLORIDE 0.9 % IV SOLN
INTRAVENOUS | Status: AC
  Administered 2013-02-28: 14:00:00 via INTRAVENOUS

## 2013-02-28 MED ORDER — HEPARIN (PORCINE) IN NACL 2-0.9 UNIT/ML-% IJ SOLN
INTRAMUSCULAR | Status: AC
  Filled 2013-02-28: qty 1000

## 2013-02-28 MED ORDER — ENOXAPARIN SODIUM 40 MG/0.4ML ~~LOC~~ SOLN
40.0000 mg | SUBCUTANEOUS | Status: DC
  Administered 2013-03-01 – 2013-03-04 (×4): 40 mg via SUBCUTANEOUS
  Filled 2013-02-28 (×6): qty 0.4

## 2013-02-28 MED ORDER — ACETAMINOPHEN 325 MG PO TABS: 650.0000 mg | ORAL_TABLET | ORAL | Status: DC | PRN

## 2013-02-28 MED ORDER — ONDANSETRON HCL 4 MG/2ML IJ SOLN: 4.0000 mg | Freq: Four times a day (QID) | INTRAMUSCULAR | Status: DC | PRN

## 2013-02-28 MED ORDER — HEPARIN SODIUM (PORCINE) 1000 UNIT/ML IJ SOLN
INTRAMUSCULAR | Status: AC
  Filled 2013-02-28: qty 1

## 2013-02-28 MED ORDER — LIDOCAINE HCL (PF) 1 % IJ SOLN
INTRAMUSCULAR | Status: AC
  Filled 2013-02-28: qty 30

## 2013-02-28 MED ORDER — CLOPIDOGREL BISULFATE 300 MG PO TABS
ORAL_TABLET | ORAL | Status: AC
  Filled 2013-02-28: qty 1

## 2013-02-28 MED ORDER — MIDAZOLAM HCL 2 MG/2ML IJ SOLN
INTRAMUSCULAR | Status: AC
  Filled 2013-02-28: qty 2

## 2013-02-28 MED ORDER — CLOPIDOGREL BISULFATE 75 MG PO TABS
75.0000 mg | ORAL_TABLET | Freq: Every day | ORAL | Status: DC
  Administered 2013-03-01 – 2013-03-04 (×4): 75 mg via ORAL
  Filled 2013-02-28 (×5): qty 1

## 2013-02-28 NOTE — Progress Notes (Signed)
Occupational Therapy Treatment Patient Details Name: Carlos Phillips MRN: [id number] DOB: 04-20-1965 Today's Date: 02/28/2013 Time: 1014-1100 OT Time Calculation (min): 46 min  OT Assessment / Plan / Recommendation  History of present illness Pt admitted 02/25/13 with clot in Lt. popliteal artery. Pt underwent Left tibial embolectomy with patch angioplasty of left popliteal artery and tibial peroneal trunk, four compartment fasciotomy   OT comments  Pt is very motivated and progressing with BADLs.  Currently, he requires mod A for LB ADLs due to pain limiting ability to access foot.   Follow Up Recommendations  No OT follow up;Supervision/Assistance - 24 hour    Barriers to Discharge       Equipment Recommendations  3 in 1 bedside comode    Recommendations for Other Services    Frequency Min 2X/week   Progress towards OT Goals Progress towards OT goals: Progressing toward goals  Plan Discharge plan remains appropriate    Precautions / Restrictions Precautions Precautions: Fall   Pertinent Vitals/Pain     ADL  Grooming: Wash/dry hands;Wash/dry face;Teeth care;Min guard Where Assessed - Grooming: Supported standing Lower Body Bathing: Minimal assistance Where Assessed - Lower Body Bathing: Supported sit to stand Lower Body Dressing: Moderate assistance Where Assessed - Lower Body Dressing: Supported sit to stand Toilet Transfer: Hydrographic surveyor Method: Sit to stand;Stand Wellsite geologist: Comfort height toilet;Grab bars;Raised toilet seat with arms (or 3-in-1 over toilet) Toileting - Clothing Manipulation and Hygiene: Min guard Where Assessed - Toileting Clothing Manipulation and Hygiene: Standing Transfers/Ambulation Related to ADLs: min guard assist ADL Comments: Pt very motivated.   Pain limits his ability to acces Lt. foot for LB ADLs    OT Diagnosis:    OT Problem List:   OT Treatment Interventions:     OT Goals(current goals can now be  found in the care plan section) ADL Goals Pt Will Perform Grooming: with modified independence;standing Pt Will Perform Lower Body Bathing: with modified independence;sit to/from stand Pt Will Perform Lower Body Dressing: with modified independence;sit to/from stand Pt Will Transfer to Toilet: with modified independence;ambulating;regular height toilet;bedside commode;grab bars Pt Will Perform Toileting - Clothing Manipulation and hygiene: with modified independence;sit to/from stand Pt Will Perform Tub/Shower Transfer: Shower transfer;with modified independence;rolling walker;3 in 1;shower seat;ambulating  Visit Information  Last OT Received On: 02/28/13 Assistance Needed: +1 History of Present Illness: Pt admitted 02/25/13 with clot in Lt. popliteal artery. Pt underwent Left tibial embolectomy with patch angioplasty of left popliteal artery and tibial peroneal trunk, four compartment fasciotomy    Subjective Data      Prior Functioning       Cognition  Cognition Arousal/Alertness: Awake/alert Behavior During Therapy: WFL for tasks assessed/performed Overall Cognitive Status: Within Functional Limits for tasks assessed    Mobility  Bed Mobility Bed Mobility: Supine to Sit;Sitting - Scoot to Edge of Bed;Sit to Supine Supine to Sit: 5: Supervision;HOB elevated Sitting - Scoot to Edge of Bed: 5: Supervision Sit to Supine: 4: Min assist Details for Bed Mobility Assistance: Assist to position Lt. LE when returning to bed Transfers Transfers: Sit to Stand;Stand to Sit Sit to Stand: 4: Min guard;With upper extremity assist;From bed Stand to Sit: 4: Min guard;With upper extremity assist;To bed Details for Transfer Assistance: verbal cues for hand placement    Exercises      Balance     End of Session OT - End of Session Equipment Utilized During Treatment: Rolling walker Activity Tolerance: Patient tolerated treatment well Patient  left: in bed;with call bell/phone within  reach;with family/visitor present Nurse Communication: Mobility status  GO     Ranyia Witting M 02/28/2013, 11:12 AM

## 2013-02-28 NOTE — Progress Notes (Signed)
PT Cancellation Note  Patient Details Name: [name] MRN: [id number] DOB: 1965/10/09   Cancelled Treatment:    Reason Eval/Treat Not Completed: Patient at procedure or test/unavailable--in cath lab for ?stent.   Sivan Quast 02/28/2013, 2:06 PM Pager [phone]

## 2013-02-28 NOTE — Op Note (Signed)
OPERATIVE NOTE   PROCEDURE: 1.  Left common femoral artery cannulation under ultrasound guidance 2.  Placement of catheter in aorta 3.  Limited bilateral leg runoff (bilateral pelvic angiogram) 3.  Left external iliac artery angioplasty and stenting (iCAST 9 mm x 38 mm)  PRE-OPERATIVE DIAGNOSIS: left leg angiogram  POST-OPERATIVE DIAGNOSIS: same as above   SURGEON: Leonides Sake, MD  ANESTHESIA: conscious sedation  ESTIMATED BLOOD LOSS: 50 cc  CONTRAST: 80 cc  FINDING(S): Patent bilateral common iliac arteries Right external iliac artery widely patent Occluded Right internal iliac artery  Patent left external iliac artery with obvious 50% lesion in proximal external iliac artery: resolved after covered stenting and angioplasty Patent left internal iliac artery  Patent bilateral common femoral artery   SPECIMEN(S):  none  INDICATIONS:   Carlos Phillips is a 47 y.o. male who presents with left leg thromboembolism.  He underwent left popliteal and tibial thromboembolectomy.  His embolic work-up was suggestive for possible left external iliac artery plaque as the source for his embolic findings intraoperative.  The patient presents for: left leg runoff and covered stenting of left external iliac artery.  I discussed with the patient the nature of angiographic procedures, especially the limited patencies of any endovascular intervention.  The patient is aware of that the risks of an angiographic procedure include but are not limited to: bleeding, infection, access site complications, renal failure, embolization, rupture of vessel, dissection, possible need for emergent surgical intervention, possible need for surgical procedures to treat the patient's pathology, and stroke and death.  The patient is aware of the risks and agrees to proceed.  DESCRIPTION: After full informed consent was obtained from the patient, the patient was brought back to the angiography suite.  The patient was  placed supine upon the angiography table and connected to monitoring equipment.  The patient was then given conscious sedation, the amounts of which are documented in the patient's chart.  The patient was prepped and drape in the standard fashion for an angiographic procedure.  At this point, attention was turned to the left groin.  Under ultrasound guidance, the left common femoral artery will be cannulated with a 18 gauge needle.  The [facility] wire was passed up into the aorta.  The needle was exchanged for a 5-Fr sheath, which was advanced over the wire into the common femoral artery.  The dilator was then removed.  The Omniflush catheter was then loaded over the wire up to the level of distal aorta.  The catheter was connected to the power injector circuit.  After de-airring and de-clotting the circuit, a power injector bilateral pelvic angiogram was completed.  A RAO projection was completed to image the left external iliac artery lesion.  Based on the images, this was the likely source for his embolism.  The wire was exchanged for a Rosen wire.  Based on the measurements, a 9 mm x 38 mm iCAST stent was selected.  The patient was given 13000 units of Heparin intravenously, which was a therapeutic bolus.  After 3 minutes, I exchanged the sheath for a long 7-Fr sheath, which I lodged just distal to the lesion.  I delivered the 9 mm x 38 mm iCAST stent centered on the left external iliac artery stenosis.  I pulled back the sheath to fully uncover the stent.  I deployed the stent at 10 atm for 1 minute.  There was a significant waist during deployment, which resolved at full deployment of the stent.  The completion  injection demonstrated complete resolution of the left external iliac artery stenosis.  At this point, I exchanged the sheath for a short 7-Fr sheath.  The sheath was aspirated.  No clots were present and the sheath was reloaded with heparinized saline.    The sheath will be removed once anticoagulation  has reversed in the holding area.  The patient was given 300 mg of Plavix while on the angiographic table.  COMPLICATIONS: none  CONDITION: stable  Leonides Sake, MD [facility] [place] Office: [phone] Pager: [phone]  02/28/2013, 12:56 PM

## 2013-02-28 NOTE — H&P (View-Only) (Signed)
Pt with clot left popliteal no real flow distally.  Will take to OR for embolectomy possible fasciotomy.  Procedure risk benefit discussed with pt and wife.  Fabienne Bruns, MD [facility] [place] Office: [phone] Pager: [phone]

## 2013-02-28 NOTE — Interval H&P Note (Signed)
[facility] [place]  History and Physical Update  The patient was interviewed and re-examined.  The patient's previous History and Physical has been reviewed and is unchanged from Dr. Darrick Penna' consult except for: interval left leg thromboembolectomy.  Dr. Darrick Penna has concerns that the patient is embolizing from a left external iliac artery lesion.  He recommended: placement of left external iliac artery stent to cover the embolizing lesion.    Leonides Sake, MD [facility] [place] Office: [phone] Pager: [phone]  02/28/2013, 11:32 AM

## 2013-02-28 NOTE — Progress Notes (Signed)
[facility]   Subjective  - leg feels less swollen numbness in foot better but still there   Objective 132/76 84 98.8 F (37.1 C) (Oral) 18 94%  Intake/Output Summary (Last 24 hours) at 02/28/13 0905 Last data filed at 02/28/13 0600  Gross per 24 hour  Intake    916 ml  Output   1775 ml  Net   -859 ml   Left foot warm doppler signals No significant fasciotomy bleeding  Assessment/Planning: Arteriogram today VAC fasciotomy Hopefully  Off PCA next 1-2 d Leukocytosis probably acute phase trending down Acute blood loss anemia stable overall asymptomatic trend  Jermey Closs E 02/28/2013 9:05 AM --  Laboratory Lab Results:  Recent Labs  02/27/13 0943 02/28/13 0335  WBC 15.1* 12.8*  HGB 13.4 12.1*  HCT 39.1 35.4*  PLT 250 234   BMET  Recent Labs  02/26/13 0840 02/28/13 0335  NA 131* 133*  K 4.0 3.8  CL 96 96  CO2 23 24  GLUCOSE 146* 115*  BUN 9 17  CREATININE 0.89 1.19  CALCIUM 9.1 8.9    COAG Lab Results  Component Value Date   INR 0.87 02/25/2013   No results found for this basename: PTT

## 2013-02-28 NOTE — Progress Notes (Signed)
Leg dressing changed due to soiling. Bed pad and top sheet had blood present. Medial site was intact with staples. Small amount of bloody drainage. Lateral fasciotomy site had unattached edges. Moderate amount of bloody drainage from site. Wet to dry dressing reapplied with gauze and ABD pads. Dressings wrapped in Kerlex. Will continue to monitor for soiling.   Genevive Bi, RN

## 2013-03-01 ENCOUNTER — Telehealth: Payer: Self-pay | Admitting: Vascular Surgery

## 2013-03-01 LAB — CBC
HCT: 34.2 % — ABNORMAL LOW (ref 39.0–52.0)
Hemoglobin: 11.5 g/dL — ABNORMAL LOW (ref 13.0–17.0)
MCH: 29.4 pg (ref 26.0–34.0)
MCHC: 33.6 g/dL (ref 30.0–36.0)
MCV: 87.5 fL (ref 78.0–100.0)
Platelets: 221 10*3/uL (ref 150–400)
RBC: 3.91 MIL/uL — ABNORMAL LOW (ref 4.22–5.81)
RDW: 14.7 % (ref 11.5–15.5)
WBC: 11 10*3/uL — ABNORMAL HIGH (ref 4.0–10.5)

## 2013-03-01 MED ORDER — OXYCODONE HCL 5 MG PO TABS: 5.0000 mg | ORAL_TABLET | Freq: Four times a day (QID) | ORAL | Status: DC | PRN

## 2013-03-01 MED ORDER — HYDROMORPHONE 0.3 MG/ML IV SOLN
INTRAVENOUS | Status: DC
  Administered 2013-03-01: 1.2 mg via INTRAVENOUS
  Administered 2013-03-01: 1.39 mg via INTRAVENOUS

## 2013-03-01 MED ORDER — OXYCODONE-ACETAMINOPHEN 5-325 MG PO TABS
1.0000 | ORAL_TABLET | ORAL | Status: DC | PRN
  Administered 2013-03-01 – 2013-03-04 (×8): 2 via ORAL
  Filled 2013-03-01 (×8): qty 2

## 2013-03-01 MED ORDER — CLOPIDOGREL BISULFATE 75 MG PO TABS: 75.0000 mg | ORAL_TABLET | Freq: Every day | ORAL | Status: DC

## 2013-03-01 NOTE — Progress Notes (Signed)
Physical Therapy Treatment Patient Details Name: Carlos Phillips MRN: [id number] DOB: [birth year] Today's Date: 03/01/2013 Time: 3086-5784 PT Time Calculation (min): 23 min  PT Assessment / Plan / Recommendation  History of Present Illness Pt admitted 02/25/13 with clot in Lt. popliteal artery. Pt underwent Left tibial embolectomy with patch angioplasty of left popliteal artery and tibial peroneal trunk, four compartment fasciotomy   PT Comments   Pt making good progress.  Encouraged him to amb in halls with staff or family over the weekend.  Follow Up Recommendations  No PT follow up     Does the patient have the potential to tolerate intense rehabilitation     Barriers to Discharge        Equipment Recommendations  Rolling walker with 5" wheels    Recommendations for Other Services    Frequency Min 5X/week   Progress towards PT Goals Progress towards PT goals: Progressing toward goals  Plan Current plan remains appropriate    Precautions / Restrictions Precautions Precautions: Fall   Pertinent Vitals/Pain See flow sheet.    Mobility  Transfers Sit to Stand: With upper extremity assist;With armrests;From chair/3-in-1;4: Min assist Stand to Sit: 5: Supervision;With upper extremity assist;With armrests;To chair/3-in-1 Details for Transfer Assistance: Pt with loss of balance to rt in initial standing as pt trying to manipulate lines/tubes. Ambulation/Gait Ambulation/Gait Assistance: 4: Min assist;4: Min Government social research officer (Feet): 225 Feet Assistive device: Rolling walker Ambulation/Gait Assistance Details: Pt min guard throughout except min A on initial 180 degree turn due to slight loss of balance. Gait Pattern: Step-through pattern;Decreased stride length;Decreased stance time - left General Gait Details: Pt able to achieve foot flat with stance.    Exercises General Exercises - Lower Extremity Ankle Circles/Pumps: AROM;Left;10 reps;Seated Quad Sets:  AROM;Left;5 reps;Seated   PT Diagnosis:    PT Problem List:   PT Treatment Interventions:     PT Goals (current goals can now be found in the care plan section)    Visit Information  Last PT Received On: 03/01/13 Assistance Needed: +1 History of Present Illness: Pt admitted 02/25/13 with clot in Lt. popliteal artery. Pt underwent Left tibial embolectomy with patch angioplasty of left popliteal artery and tibial peroneal trunk, four compartment fasciotomy    Subjective Data      Cognition  Cognition Arousal/Alertness: Awake/alert Behavior During Therapy: WFL for tasks assessed/performed Overall Cognitive Status: Within Functional Limits for tasks assessed    Balance  Static Standing Balance Static Standing - Balance Support: Bilateral upper extremity supported Static Standing - Level of Assistance: 5: Stand by assistance  End of Session PT - End of Session Activity Tolerance: Patient tolerated treatment well Patient left: in chair;with call bell/phone within reach Nurse Communication: Mobility status   GP     [facility] 03/01/2013, 3:30 PM  [employer] PT [phone]

## 2013-03-01 NOTE — Consult Note (Signed)
WOC wound Consult: Requested by VVS service to apply vac to left outer calf fasciotomy wound Wound type: Full thickness post-op incision Measurement:12X5X.3cm Wound bed:100% beefy red with exposed muscle Drainage (amount, consistency, odor) Mod amt yellow drainage, no odor Periwound: Generalized edema and erythremia surrounding wound. Dressing procedure/placement/frequency: Applied one piece black foam with Mepitel contact layer over wound bed.  Pt tolerated with minimal discomfort.  Cont suction at 75mm.  Plan for bedside nurse to change dressing Q M/W/F. VVS plans to follow for possible closure in OR before discharge according to progress notes. Please re-consult if further assistance is needed.  Thank-you,  Cammie Mcgee MSN, RN, CWOCN, [place], CNS [phone]

## 2013-03-01 NOTE — Telephone Encounter (Addendum)
Message copied by Fredrich Birks on Fri Mar 01, 2013 11:16 AM ------      Message from: Dara Lords      Created: Fri Mar 01, 2013  8:29 AM       Left tibial embolectomy with patch angioplasty of left popliteal artery and tibial peroneal trunk, four compartment fasciotomy         02/25/13      and      1. Left common femoral artery cannulation under ultrasound guidance        2. Placement of catheter in aorta        3. Limited bilateral leg runoff (bilateral pelvic angiogram)        3. Left external iliac artery angioplasty and stenting (iCAST 9 mm x 38 mm)  02/28/13            F/u with Dr. Darrick Penna in 2 weeks.            Thanks,      Samantha ------   03/01/13: left message on home # regarding appt info, dpm

## 2013-03-01 NOTE — Progress Notes (Addendum)
[facility] Progress Note  03/01/2013 7:53 AM 1 Day Post-Op  Subjective:  C/o tingling in left foot  Afebrile x 24 hrs VSS 100% 2LO2NC  Filed Vitals:   03/01/13 0602  BP: 126/72  Pulse: 102  Temp: 98.1 F (36.7 C)  Resp: 18    Physical Exam: Incisions:  Left groin is soft without hematoma Extremities:  Left foot is warm and well perfused.  CBC    Component Value Date/Time   WBC 11.0* 03/01/2013 0424   WBC 8.8 05/05/2012 1153   RBC 3.91* 03/01/2013 0424   RBC 4.84 05/05/2012 1153   HGB 11.5* 03/01/2013 0424   HGB 14.1 05/05/2012 1153   HCT 34.2* 03/01/2013 0424   HCT 44.3 05/05/2012 1153   PLT 221 03/01/2013 0424   MCV 87.5 03/01/2013 0424   MCV 91.5 05/05/2012 1153   MCH 29.4 03/01/2013 0424   MCH 29.1 05/05/2012 1153   MCHC 33.6 03/01/2013 0424   MCHC 31.8 05/05/2012 1153   RDW 14.7 03/01/2013 0424    BMET    Component Value Date/Time   NA 133* 02/28/2013 0335   K 3.8 02/28/2013 0335   CL 96 02/28/2013 0335   CO2 24 02/28/2013 0335   GLUCOSE 115* 02/28/2013 0335   BUN 17 02/28/2013 0335   CREATININE 1.19 02/28/2013 0335   CREATININE 1.32 05/05/2012 1153   CALCIUM 8.9 02/28/2013 0335   GFRNONAA 71* 02/28/2013 0335   GFRAA 82* 02/28/2013 0335    INR    Component Value Date/Time   INR 0.87 02/25/2013 1210     Intake/Output Summary (Last 24 hours) at 03/01/13 0753 Last data filed at 03/01/13 0603  Gross per 24 hour  Intake    720 ml  Output   3200 ml  Net  -2480 ml     Assessment:  47 y.o. male is s/p:  Left tibial embolectomy with patch angioplasty of left popliteal artery and tibial peroneal trunk, four compartment fasciotomy   4 Day Post Op  1. Left common femoral artery cannulation under ultrasound guidance  2. Placement of catheter in aorta  3. Limited bilateral leg runoff (bilateral pelvic angiogram)  3. Left external iliac artery angioplasty and stenting (iCAST 9 mm x 38 mm)  1 Day Post-Op  Plan: -pt doing well this am -will need Plavix  indefinitely for L EIA stent -DVT prophylaxis:  Lovenox -mobilize -wean PCA -wound vac for fasciotomy-Will re-evaluate next week for closure. -acute surgical blood loss anemia-pt tolerating -BUN/Cr normal with good UOP   Doreatha Massed, PA-C [facility] [phone] 03/01/2013 7:53 AM  Addendum  I have independently interviewed and examined the patient, and I agree with the physician assistant's findings.  VAC on today.  Continued PCA wean  Leonides Sake, MD [facility] [place] Office: [phone] Pager: [phone]  03/01/13 0800

## 2013-03-01 NOTE — Progress Notes (Signed)
Leg dressing changed per order.. Medial site was intact with staples. Small amount of bloody drainage. Lateral fasciotomy site had unattached edges. Moderate amount of bloody drainage from site. Wet to dry dressing reapplied with gauze and ABD pads. Dressings wrapped in Kerlex. Will continue to monitor for soiling. Clovis Fredrickson A

## 2013-03-02 LAB — CBC
HCT: 35.3 % — ABNORMAL LOW (ref 39.0–52.0)
Hemoglobin: 12.1 g/dL — ABNORMAL LOW (ref 13.0–17.0)
MCH: 30 pg (ref 26.0–34.0)
MCHC: 34.3 g/dL (ref 30.0–36.0)
MCV: 87.4 fL (ref 78.0–100.0)
Platelets: 203 10*3/uL (ref 150–400)
RBC: 4.04 MIL/uL — ABNORMAL LOW (ref 4.22–5.81)
RDW: 14.7 % (ref 11.5–15.5)
WBC: 12.5 10*3/uL — ABNORMAL HIGH (ref 4.0–10.5)

## 2013-03-02 MED ORDER — MAGNESIUM HYDROXIDE 400 MG/5ML PO SUSP
30.0000 mL | Freq: Once | ORAL | Status: AC
  Administered 2013-03-02: 30 mL via ORAL
  Filled 2013-03-02: qty 30

## 2013-03-02 NOTE — Progress Notes (Addendum)
[facility] Progress Note  03/02/2013 10:41 AM 2 Days Post-Op  Subjective:  No complaints  Afebrile VSS 97% RA  Filed Vitals:   03/02/13 0814  BP:   Pulse:   Temp:   Resp: 14    Physical Exam: Incisions:  Left below knee incision does have some serous drainage with blistering at proximal portion of incision.  Wound vac in place with good seal. Extremities:  Left foot is warm and well perfused.  CBC    Component Value Date/Time   WBC 12.5* 03/02/2013 0355   WBC 8.8 05/05/2012 1153   RBC 4.04* 03/02/2013 0355   RBC 4.84 05/05/2012 1153   HGB 12.1* 03/02/2013 0355   HGB 14.1 05/05/2012 1153   HCT 35.3* 03/02/2013 0355   HCT 44.3 05/05/2012 1153   PLT 203 03/02/2013 0355   MCV 87.4 03/02/2013 0355   MCV 91.5 05/05/2012 1153   MCH 30.0 03/02/2013 0355   MCH 29.1 05/05/2012 1153   MCHC 34.3 03/02/2013 0355   MCHC 31.8 05/05/2012 1153   RDW 14.7 03/02/2013 0355    BMET    Component Value Date/Time   NA 133* 02/28/2013 0335   K 3.8 02/28/2013 0335   CL 96 02/28/2013 0335   CO2 24 02/28/2013 0335   GLUCOSE 115* 02/28/2013 0335   BUN 17 02/28/2013 0335   CREATININE 1.19 02/28/2013 0335   CREATININE 1.32 05/05/2012 1153   CALCIUM 8.9 02/28/2013 0335   GFRNONAA 71* 02/28/2013 0335   GFRAA 82* 02/28/2013 0335    INR    Component Value Date/Time   INR 0.87 02/25/2013 1210     Intake/Output Summary (Last 24 hours) at 03/02/13 1041 Last data filed at 03/02/13 0700  Gross per 24 hour  Intake   1200 ml  Output   1925 ml  Net   -725 ml     Assessment:  47 y.o. male is s/p:  Left tibial embolectomy with patch angioplasty of left popliteal artery and tibial peroneal trunk, four compartment fasciotomy   2 Days Post-Op  Plan: -pt doing well this am -blisters by incision-advised to keep leg elevated -will change wound vac Monday and evaluate for fasciotomy closure. -DVT prophylaxis:  Lovenox -continue Plavix for iliac stent -PCA is off today-po pain medication -continue  to mobilize -check labs in am-slight increase in WBC-will ck CBC in am   Doreatha Massed, PA-C [facility] [phone] 03/02/2013 10:41 AM    Agree with above VAC in place doing well Possible fasciotomy closure first of week

## 2013-03-02 NOTE — Progress Notes (Signed)
DC PCA per MD order; Nothing to waste-empty syringe witnessed with Malen Gauze, RN; will continue to monitor patient; encouraged patient to notify if pain worsens  BARNETT, Channie Bostick M

## 2013-03-03 LAB — CBC
HCT: 35.3 % — ABNORMAL LOW (ref 39.0–52.0)
Hemoglobin: 12.2 g/dL — ABNORMAL LOW (ref 13.0–17.0)
MCH: 29.8 pg (ref 26.0–34.0)
MCHC: 34.6 g/dL (ref 30.0–36.0)
MCV: 86.3 fL (ref 78.0–100.0)
Platelets: 284 10*3/uL (ref 150–400)
RBC: 4.09 MIL/uL — ABNORMAL LOW (ref 4.22–5.81)
RDW: 14.7 % (ref 11.5–15.5)
WBC: 11.4 10*3/uL — ABNORMAL HIGH (ref 4.0–10.5)

## 2013-03-03 LAB — BASIC METABOLIC PANEL
BUN: 18 mg/dL (ref 6–23)
CO2: 29 mEq/L (ref 19–32)
Calcium: 9.3 mg/dL (ref 8.4–10.5)
Chloride: 96 mEq/L (ref 96–112)
Creatinine, Ser: 1.12 mg/dL (ref 0.50–1.35)
GFR calc Af Amer: 88 mL/min — ABNORMAL LOW (ref >90)
GFR calc non Af Amer: 76 mL/min — ABNORMAL LOW (ref >90)
Glucose, Bld: 118 mg/dL — ABNORMAL HIGH (ref 70–99)
Potassium: 4.4 mEq/L (ref 3.5–5.1)
Sodium: 135 mEq/L (ref 135–145)

## 2013-03-03 MED ORDER — BISACODYL 10 MG RE SUPP
10.0000 mg | Freq: Once | RECTAL | Status: DC
  Filled 2013-03-03: qty 1

## 2013-03-03 NOTE — Progress Notes (Addendum)
[facility] Progress Note  03/03/2013 8:00 AM 3 Days Post-Op  Subjective:  A little sore behind the knee this am  Afebrile VSS  Filed Vitals:   03/03/13 0408  BP: 132/87  Pulse: 89  Temp: 98.4 F (36.9 C)  Resp: 18    Physical Exam: Incisions:  Mild serosanginous drainage from wound; staples in tact; blistering at incision is slightly improved today Extremities:  + audible doppler signals left PT/DP; wound vac with good seal.  CBC    Component Value Date/Time   WBC 11.4* 03/03/2013 0434   WBC 8.8 05/05/2012 1153   RBC 4.09* 03/03/2013 0434   RBC 4.84 05/05/2012 1153   HGB 12.2* 03/03/2013 0434   HGB 14.1 05/05/2012 1153   HCT 35.3* 03/03/2013 0434   HCT 44.3 05/05/2012 1153   PLT 284 03/03/2013 0434   MCV 86.3 03/03/2013 0434   MCV 91.5 05/05/2012 1153   MCH 29.8 03/03/2013 0434   MCH 29.1 05/05/2012 1153   MCHC 34.6 03/03/2013 0434   MCHC 31.8 05/05/2012 1153   RDW 14.7 03/03/2013 0434    BMET    Component Value Date/Time   NA 135 03/03/2013 0434   K 4.4 03/03/2013 0434   CL 96 03/03/2013 0434   CO2 29 03/03/2013 0434   GLUCOSE 118* 03/03/2013 0434   BUN 18 03/03/2013 0434   CREATININE 1.12 03/03/2013 0434   CREATININE 1.32 05/05/2012 1153   CALCIUM 9.3 03/03/2013 0434   GFRNONAA 76* 03/03/2013 0434   GFRAA 88* 03/03/2013 0434    INR    Component Value Date/Time   INR 0.87 02/25/2013 1210     Intake/Output Summary (Last 24 hours) at 03/03/13 0800 Last data filed at 03/03/13 0152  Gross per 24 hour  Intake    240 ml  Output    825 ml  Net   -585 ml     Assessment:  47 y.o. male is s/p:  Left tibial embolectomy with patch angioplasty of left popliteal artery and tibial peroneal trunk, four compartment fasciotomy  6 Days Post-Op And 1. Left common femoral artery cannulation under ultrasound guidance  2. Placement of catheter in aorta  3. Limited bilateral leg runoff (bilateral pelvic angiogram)  3. Left external iliac artery angioplasty and stenting  (iCAST 9 mm x 38 mm)  3 Days Post-Op  Plan: -pt doing well this am-bypass graft is patent with good doppler signals in the left PT/DP -blistering at incision slightly improved today-continue leg elevation -wound vac off tomorrow-Dr. Darrick Penna to evaluate for fasciotomy closure. -pt WBC improved today and pt is afebrile -continue Plavix for iliac stent -continue to mobilize OOB to chair and ambulating -DVT prophylaxis:  Lovenox -Hgb stable   Carlos Massed, PA-C [facility] [phone] 03/03/2013 8:00 AM    Feeling better today 4-VAC changed tomorrow  Dr. Darrick Penna to decide regarding timing of closure of fasciotomy wounds No complaints today

## 2013-03-03 NOTE — Progress Notes (Signed)
Patient's wound vac chamber has a total of of serosanguineous output. Marked the chamber with a black marker.

## 2013-03-04 ENCOUNTER — Telehealth: Payer: Self-pay | Admitting: Vascular Surgery

## 2013-03-04 LAB — CBC
HCT: 36.2 % — ABNORMAL LOW (ref 39.0–52.0)
Hemoglobin: 12.3 g/dL — ABNORMAL LOW (ref 13.0–17.0)
MCH: 29.6 pg (ref 26.0–34.0)
MCHC: 34 g/dL (ref 30.0–36.0)
MCV: 87 fL (ref 78.0–100.0)
Platelets: 266 10*3/uL (ref 150–400)
RBC: 4.16 MIL/uL — ABNORMAL LOW (ref 4.22–5.81)
RDW: 14.7 % (ref 11.5–15.5)
WBC: 11.3 10*3/uL — ABNORMAL HIGH (ref 4.0–10.5)

## 2013-03-04 MED ORDER — ATORVASTATIN CALCIUM 10 MG PO TABS: 10.0000 mg | ORAL_TABLET | Freq: Every day | ORAL | Status: DC

## 2013-03-04 NOTE — Progress Notes (Addendum)
[facility] Progress Note  03/04/2013 12:28 PM 4 Days Post-Op  Subjective:  Walking in halls-pt states wound vac was changed this am  Afebrile VSS  Filed Vitals:   03/04/13 1026  BP: 115/74  Pulse: 76  Temp:   Resp:     Physical Exam: Incisions:  Unchanged from yesterday.  Fasciotomy site with wound vac in good position with good seal. Extremities:  Left foot is warm.  CBC    Component Value Date/Time   WBC 11.3* 03/04/2013 0710   WBC 8.8 05/05/2012 1153   RBC 4.16* 03/04/2013 0710   RBC 4.84 05/05/2012 1153   HGB 12.3* 03/04/2013 0710   HGB 14.1 05/05/2012 1153   HCT 36.2* 03/04/2013 0710   HCT 44.3 05/05/2012 1153   PLT 266 03/04/2013 0710   MCV 87.0 03/04/2013 0710   MCV 91.5 05/05/2012 1153   MCH 29.6 03/04/2013 0710   MCH 29.1 05/05/2012 1153   MCHC 34.0 03/04/2013 0710   MCHC 31.8 05/05/2012 1153   RDW 14.7 03/04/2013 0710    BMET    Component Value Date/Time   NA 135 03/03/2013 0434   K 4.4 03/03/2013 0434   CL 96 03/03/2013 0434   CO2 29 03/03/2013 0434   GLUCOSE 118* 03/03/2013 0434   BUN 18 03/03/2013 0434   CREATININE 1.12 03/03/2013 0434   CREATININE 1.32 05/05/2012 1153   CALCIUM 9.3 03/03/2013 0434   GFRNONAA 76* 03/03/2013 0434   GFRAA 88* 03/03/2013 0434    INR    Component Value Date/Time   INR 0.87 02/25/2013 1210     Intake/Output Summary (Last 24 hours) at 03/04/13 1228 Last data filed at 03/04/13 0700  Gross per 24 hour  Intake    720 ml  Output   1125 ml  Net   -405 ml     Assessment:  47 y.o. male is s/p:  Left tibial embolectomy with patch angioplasty of left popliteal artery and tibial peroneal trunk, four compartment fasciotomy  7 Days Post-Op  And  1. Left common femoral artery cannulation under ultrasound guidance  2. Placement of catheter in aorta  3. Limited bilateral leg runoff (bilateral pelvic angiogram)  3. Left external iliac artery angioplasty and stenting (iCAST 9 mm x 38 mm)  4 Days Post-Op   4 Days  Post-Op  Plan: -doing well.  Will discharge today with wound vac.  -pt to f/u with Dr. Darrick Penna on Thursday 03/07/13 and will schedule closure of fasciotomy site next week. -DVT prophylaxis:  Lovenox -WBC and Hgb are stable   Doreatha Massed, PA-C [facility] [phone] 03/04/2013 12:28 PM    Continues to improve daily, palpable PT pulse Incisions overall healing, fasciotomy less edematous Will d/c home today on Plavix Follow up with me later this week to see if we can close fasciotomy. Follow up ECHO prior to d/c but most likely embolus was from left iliac plaque rupture  Fabienne Bruns, MD [facility] [place] Office: [phone] Pager: [phone]

## 2013-03-04 NOTE — Discharge Summary (Signed)
[facility] Discharge Summary  Carlos Phillips 01-08-1966 47 y.o. male  [id number]  Admission Date: 02/25/2013  Discharge Date: 03/04/13  Physician: Sherren Kerns, MD  Admission Diagnosis: Obesity [278.00] Left leg pain [729.5] Peripheral vascular occlusive disease [443.9]   HPI:   This is a 47 y.o. male who states this morning around 8 am, he got a cramp in his left lower leg. He states that he took a tablespoon of mustard and it improved briefly, but then the pain returned. He was then taken to the [facility] ED. He underwent arterial duplex, which revealed:  Right ABI Is within normal limits with Doppler waveforms within normal limits. Left ABI could not be ascertained due to absent Doppler signals.  Duplex scan of the left lower extremity revealed dampened monophasic waveforms throughout the thigh with minimal velocities with no significant areas of stenosis. Probable iliac occlusive disease. There appears to be an occlusion of the popliteal artery thrombus versus homogeneous plaque. Thumping Doppler signal. Unable to visualize the anterior tibial, posterior tibial, or peroneal well enough to evaluate. Right common femoral Doppler waveform was triphasic.  ABI on the right is 1.2.  He states that he does have decreased sensation and slightly decreased movement of left foot. He states that he does some increased cholesterol (187) and is not on a statin at this time. He states that he has been tested for diabetes, but it was negative.  He does smoke about a ppd and has smoked since he was 15. He denies arryhthmia or afib chest pain or shortness of breath. Denies history of diabetes.  Hospital Course:  The patient was admitted to the hospital and taken to the operating room on 02/25/2013 - 02/28/2013 and underwent: Left tibial embolectomy with patch angioplasty of left popliteal artery and tibial peroneal trunk, four compartment fasciotomy.  He was placed on a heparin gtt  post operatively.    The pt tolerated the procedure well and was transported to the PACU in good condition.   By POD 1, he was doing well with audible doppler signals in his left foot.  An  echo was ordered.    2D ECHO RESULTS FROM 02/26/13: -Left ventricle:  Cavity size was normal.  Wall thickness was normal.  Systolic function was normal.  The estimated EF was in the range of 55-60%.  Although no diagnostic regional wall motion abnormality was identified, this possibility cannot be completely excluded on the basis of this study.  LV diastolic function parameters were normal.  There was no AI or AS.  The aortic root and ascending aorta was normal size.  There was no mitral stenosis or MR.  There was no significant TR.  There is no mention of thrombus in the report.  (The report is not in epic an was gotten from the echo lab from [facility]).  He was continued on a heparin gtt.  He had acute surgical blood loss anemia and was tolerating well.  He did require PCA and this was weaned successfully.  By POD 3, a wound vac was placed to the fasciotomy site.  His leukocytosis was trending downward postoperatively.  POD 4, he was taken to the [facility] lab and underwent: 1. Left common femoral artery cannulation under ultrasound guidance  2. Placement of catheter in aorta  3. Limited bilateral leg runoff (bilateral pelvic angiogram)  3. Left external iliac artery angioplasty and stenting (iCAST 9 mm x 38 mm)  He tolerated well and was transferred to  the PACU in stable condition.  At that time, his heparin gtt was discontinued and he was started on Plavix, which he will need indefinitely.  He did have some blistering at the below the knee incision and this was slightly improved at discharge.  On POD 7, he was discharged home with a wound vac.  His WBC and Hgb are stable at discharge.  He will f/u with Dr. Darrick Penna on 03/07/13 and plan for fasciotomy closure next week.  ABI's are as follows:  RIGHT    LEFT      PRESSURE  WAVEFORM   PRESSURE  WAVEFORM   BRACHIAL  122  biphasic  BRACHIAL  120  triphasic   DP  124  triphasic  DP     AT    AT     PT   absent  PT  89  monophasic   PER   absent  PER  95  monophasic   GREAT TOE   adequate  GREAT TOE   flat     RIGHT  LEFT   ABI  >1.0  0.78     The remainder of the hospital course consisted of increasing mobilization and increasing intake of solids without difficulty.  CBC    Component Value Date/Time   WBC 11.3* 03/04/2013 0710   WBC 8.8 05/05/2012 1153   RBC 4.16* 03/04/2013 0710   RBC 4.84 05/05/2012 1153   HGB 12.3* 03/04/2013 0710   HGB 14.1 05/05/2012 1153   HCT 36.2* 03/04/2013 0710   HCT 44.3 05/05/2012 1153   PLT 266 03/04/2013 0710   MCV 87.0 03/04/2013 0710   MCV 91.5 05/05/2012 1153   MCH 29.6 03/04/2013 0710   MCH 29.1 05/05/2012 1153   MCHC 34.0 03/04/2013 0710   MCHC 31.8 05/05/2012 1153   RDW 14.7 03/04/2013 0710    BMET    Component Value Date/Time   NA 135 03/03/2013 0434   K 4.4 03/03/2013 0434   CL 96 03/03/2013 0434   CO2 29 03/03/2013 0434   GLUCOSE 118* 03/03/2013 0434   BUN 18 03/03/2013 0434   CREATININE 1.12 03/03/2013 0434   CREATININE 1.32 05/05/2012 1153   CALCIUM 9.3 03/03/2013 0434   GFRNONAA 76* 03/03/2013 0434   GFRAA 88* 03/03/2013 0434     Discharge Instructions:   The patient is discharged to home with extensive instructions on wound care and progressive ambulation.  They are instructed not to drive or perform any heavy lifting until returning to see the physician in his office.  Discharge Orders   Future Appointments Provider Department Dept Phone   03/07/2013 3:30 PM Sherren Kerns, MD [facility] [phone]   Future Orders Complete By Expires   Call MD for:  redness, tenderness, or signs of infection (pain, swelling, bleeding, redness, odor or green/yellow discharge around incision site)  As directed    Call MD for:  severe or increased pain, loss or decreased feeling  in  affected limb(s)  As directed    Call MD for:  temperature >100.5  As directed    Discharge wound care:  As directed    Comments:     Shower daily with soap and water.   Driving Restrictions  As directed    Comments:     No driving for 2 weeks   Lifting restrictions  As directed    Comments:     No lifting for 4 weeks   Resume previous diet  As directed  Discharge Diagnosis:  Obesity [278.00] Left leg pain [729.5] Peripheral vascular occlusive disease [443.9]  Secondary Diagnosis: Patient Active Problem List   Diagnosis Date Noted  . Ischemia of extremity 02/25/2013  . HTN (hypertension) 05/07/2011  . Erectile dysfunction 05/07/2011  . Obesity 05/07/2011   Past Medical History  Diagnosis Date  . Hypertension        Medication List         amLODipine 10 MG tablet  Commonly known as:  NORVASC  Take 1 tablet (10 mg total) by mouth daily.     BC HEADACHE POWDER PO  Take 1 packet by mouth daily.     cloNIDine 0.2 MG tablet  Commonly known as:  CATAPRES  Take 1 tablet twice a day .     clopidogrel 75 MG tablet  Commonly known as:  PLAVIX  Take 1 tablet (75 mg total) by mouth daily with breakfast.     hydrochlorothiazide 25 MG tablet  Commonly known as:  HYDRODIURIL  Take 1 tablet (25 mg total) by mouth daily.     multivitamin with minerals tablet  Take 1 tablet by mouth daily.     oxyCODONE 5 MG immediate release tablet  Commonly known as:  ROXICODONE  Take 1-2 tablets (5-10 mg total) by mouth every 6 (six) hours as needed for moderate pain or severe pain.     sildenafil 100 MG tablet  Commonly known as:  VIAGRA  Take 0.5-1 tablets (50-100 mg total) by mouth daily as needed for erectile dysfunction.        Roxicodone #30 No Refill  Disposition: home  Patient's condition: is Good  Follow up: 1. Dr. Darrick Penna 03/07/13   Doreatha Massed, PA-C [facility] [phone] 03/04/2013  12:50 PM  - For VQI Registry use  --- Instructions: Press F2 to tab through selections.  Delete question if not applicable.   Post-op:  Wound infection: No  Graft infection: No  Transfusion: No  If yes, n/a units given New Arrhythmia: No Ipsilateral amputation: No, [ ]  Minor, [ ]  BKA, [ ]  AKA Discharge patency: [ ]  Primary, [ ]  Primary assisted, [ ]  Secondary, [ ]  Occluded Patency judged by: [ ]  Dopper only, [ ]  Palpable graft pulse, [ ]  Palpable distal pulse, [ ]  ABI inc. > 0.15, [ ]  Duplex Discharge ABI: R >1, L 0.78 Discharge TBI: R , L  D/C Ambulatory Status: Ambulatory  Complications: MI: No, [ ]  Troponin only, [ ]  EKG or Clinical CHF: No Resp failure:No, [ ]  Pneumonia, [ ]  Ventilator Chg in renal function: No, [ ]  Inc. Cr > 0.5, [ ]  Temp. Dialysis, [ ]  Permanent dialysis Stroke: No, [ ]  Minor, [ ]  Major Return to OR: No  Reason for return to OR: [ ]  Bleeding, [ ]  Infection, [ ]  Thrombosis, [ ]  Revision  Discharge medications: Statin use:  Yes ASA use:  No  for medical reason on Plavix Plavix use:  Yes Beta blocker use: No Coumadin use: No  for medical reason not indicated

## 2013-03-04 NOTE — Progress Notes (Signed)
Occupational Therapy Treatment Patient Details Name: Carlos Phillips MRN: [id number] DOB: 1965/08/19 Today's Date: 03/04/2013 Time: 4098-1191 OT Time Calculation (min): 13 min  OT Assessment / Plan / Recommendation  History of present illness Pt admitted 02/25/13 with clot in Lt. popliteal artery. Pt underwent Left tibial embolectomy with patch angioplasty of left popliteal artery and tibial peroneal trunk, four compartment fasciotomy   OT comments  Pt. Eagerly awaiting d/c home.  Reports wife will assist with all lb selfcare.  Declined shower stall transfer secondary to states he will sponge bathe initially.  Currently max a for lb dressing secondary to LLE pain.  Follow Up Recommendations  No OT follow up           Equipment Recommendations  3 in 1 bedside comode        Frequency Min 2X/week   Progress towards OT Goals Progress towards OT goals: Progressing toward goals  Plan Discharge plan remains appropriate    Precautions / Restrictions Precautions Precautions: Fall Precaution Comments: wound vac   Pertinent Vitals/Pain 6/10    ADL  Lower Body Dressing: Performed;Moderate assistance;Maximal assistance Where Assessed - Lower Body Dressing: Supported sitting Tub/Shower Transfer: Other (comment) (delined states he will sponge bathe) Transfers/Ambulation Related to ADLs: sit/stand, sim. lb dressing, states wife assisted dressing pt. this a.m., attemtped don/doff socks and pt. unable to complete with out assistance secondary to increased c/o pain with forward trunk flexion to reach forward.  declines shower transfer/practice stating wife to assist with sponge bath      OT Goals(current goals can now be found in the care plan section)    Visit Information  Last OT Received On: 03/04/13 Assistance Needed: +1 History of Present Illness: Pt admitted 02/25/13 with clot in Lt. popliteal artery. Pt underwent Left tibial embolectomy with patch angioplasty of left popliteal  artery and tibial peroneal trunk, four compartment fasciotomy                 Cognition  Cognition Arousal/Alertness: Awake/alert Behavior During Therapy: WFL for tasks assessed/performed Overall Cognitive Status: Within Functional Limits for tasks assessed    Mobility  Bed Mobility Bed Mobility: Not assessed Details for Bed Mobility Assistance: pt. up in chair Transfers Transfers: Sit to Stand;Stand to Sit Sit to Stand: 5: Supervision;With armrests;From chair/3-in-1 Stand to Sit: 5: Supervision;With armrests;To chair/3-in-1 Details for Transfer Assistance: set up for vac attachement onto walker    Exercises  Other Exercises Other Exercises: Educated on car transfers and wife educated how to assist on stairs for home entry   Balance     End of Session OT - End of Session Activity Tolerance: Patient tolerated treatment well Patient left: in chair;with call bell/phone within reach       Robet Leu, COTA/L 03/04/2013, 3:09 PM

## 2013-03-04 NOTE — Progress Notes (Signed)
Wound vac dressing changed per order. Wound bed pink with no drainage noted. Wound vac continued at suction per order. Left lateral incision dressing changed. 3 blisters noted around incision but incision is well approximated. Left leg elevated with pillows. Patient remains in bedside chair. Call bell and family near.Carlos Phillips

## 2013-03-04 NOTE — Progress Notes (Signed)
Physical Therapy Treatment Patient Details Name: Carlos Phillips MRN: [id number] DOB: [birth year] Today's Date: 03/04/2013 Time: 4098-1191 PT Time Calculation (min): 25 min  PT Assessment / Plan / Recommendation  History of Present Illness Pt admitted 02/25/13 with clot in Lt. popliteal artery. Pt underwent Left tibial embolectomy with patch angioplasty of left popliteal artery and tibial peroneal trunk, four compartment fasciotomy   PT Comments   Patient able to achieve goals this pm with likely d/c home planned for today.  Agree with no follow up PT at this time, but educated pt if having difficulty getting strength or motion back after wound closure to ask MD to order outpatient PT.  Follow Up Recommendations  No PT follow up     Does the patient have the potential to tolerate intense rehabilitation   N/A  Barriers to Discharge  None      Equipment Recommendations  Rolling walker with 5" wheels    Recommendations for Other Services  None  Frequency Min 5X/week   Progress towards PT Goals Progress towards PT goals: Progressing toward goals  Plan Current plan remains appropriate    Precautions / Restrictions Precautions Precautions: Fall Precaution Comments: wound vac   Pertinent Vitals/Pain Initially c/o pain left calf with stretching muscle, but improved with walking    Mobility  Bed Mobility Bed Mobility: Not assessed Details for Bed Mobility Assistance: pt up in chair Transfers Sit to Stand: 6: Modified independent (Device/Increase time);With armrests;From chair/3-in-1 Stand to Sit: To chair/3-in-1;6: Modified independent (Device/Increase time) Details for Transfer Assistance: set up for vac attachement onto walker Ambulation/Gait Ambulation/Gait Assistance: 5: Supervision Ambulation Distance (Feet): 220 Feet (and 300') Assistive device: Rolling walker Ambulation/Gait Assistance Details: supervision for safety with vac tubing management; initially with step to  technique and then able to progress with rolling over left foot and improved step length Gait Pattern: Step-to pattern;Step-through pattern;Antalgic;Decreased step length - right Stairs: Yes Stairs Assistance: 5: Supervision Stairs Assistance Details (indicate cue type and reason): cues for technique and sequence Stair Management Technique: One rail Left;Sideways Number of Stairs: 4 (x 2)    Exercises Other Exercises Other Exercises: Educated on car transfers and wife educated how to assist on stairs for home entry   PT Goals (current goals can now be found in the care plan section)    Visit Information  Last PT Received On: 03/04/13 Assistance Needed: +1 History of Present Illness: Pt admitted 02/25/13 with clot in Lt. popliteal artery. Pt underwent Left tibial embolectomy with patch angioplasty of left popliteal artery and tibial peroneal trunk, four compartment fasciotomy    Subjective Data   I want to go home.   Cognition  Cognition Arousal/Alertness: Awake/alert Behavior During Therapy: WFL for tasks assessed/performed Overall Cognitive Status: Within Functional Limits for tasks assessed    Balance     End of Session PT - End of Session Equipment Utilized During Treatment: Gait belt Activity Tolerance: Patient tolerated treatment well Patient left: in chair;with call bell/phone within reach;with family/visitor present   GP     [facility] 03/04/2013, 1:07 PM Sheran Lawless, PT [phone] 03/04/2013

## 2013-03-04 NOTE — Progress Notes (Signed)
Home wound vac provided per [facility]. Patient wound vac transferred to home portable vac. Personal walker provided per [facility]. Ok for discharge home. Discharge instructions along with med list and stent card provided. IV d/c'd with catheter intact. Patient transported via wheelchair to main lobby for d/c home. [facility] supplies and belongings with patient.Carlos Phillips

## 2013-03-04 NOTE — Telephone Encounter (Addendum)
Message copied by Fredrich Birks on Mon Mar 04, 2013  1:21 PM ------      Message from: Dara Lords      Created: Mon Mar 04, 2013 12:47 PM       Dr. Darrick Penna wants this pt to see him this Thursday, 03/07/13.            Thanks,      Samantha ------  03/04/13: spoke with pts wife to schedule appt, dpm

## 2013-03-06 ENCOUNTER — Encounter: Payer: Self-pay | Admitting: Vascular Surgery

## 2013-03-07 ENCOUNTER — Ambulatory Visit (INDEPENDENT_AMBULATORY_CARE_PROVIDER_SITE_OTHER): Payer: BC Managed Care – PPO | Admitting: Vascular Surgery

## 2013-03-07 ENCOUNTER — Encounter: Payer: Self-pay | Admitting: Vascular Surgery

## 2013-03-07 VITALS — Ht 72.0 in | Wt 350.0 lb

## 2013-03-07 DIAGNOSIS — I749 Embolism and thrombosis of unspecified artery: Secondary | ICD-10-CM

## 2013-03-07 DIAGNOSIS — Z86718 Personal history of other venous thrombosis and embolism: Secondary | ICD-10-CM | POA: Insufficient documentation

## 2013-03-07 MED ORDER — OXYCODONE-ACETAMINOPHEN 5-325 MG PO TABS: 1.0000 | ORAL_TABLET | ORAL | Status: DC | PRN

## 2013-03-07 NOTE — Progress Notes (Signed)
Patient is  a 47 year old male who is status post left popliteal and tibial embolectomy followed by left external iliac artery stenting approximately 10 days ago. He also had 4 compartment fasciotomy. He has a VAC an anterior fasciotomy currently. Since leaving the hospital several days ago he has had a fair amount of pain in the posterior calf. He has had no increased swelling. He still has some numbness in the foot but overall this is improved. He is currently taking oxycodone for the pain with some relief.  Physical exam:  Filed Vitals:   03/07/13 1517  Height: 6' (1.829 m)  Weight: 350 lb (158.759 kg)   heart rate 110  Left lower extremity: Biphasic posterior tibial and dorsalis pedis Doppler signals, lateral fasciotomy wound clean with healthy appearing muscle tissue, pain on palpation over the posterior compartments but no tense area, small amount of serous drainage from the stapled area on the medial calf  Assessment: Lateral fasciotomy wound is still too edematous to consider closing left foot well-perfused pain medial leg secondary to fasciotomy and splitting the muscle tissue. Plan: Followup in 3 weeks to consider skin grafting of lateral compartment. Patient given her a renewal of his Percocet prescription today 5/325 #80 dispensed.  Continue VAC 75 mm mercury.  Continue Plavix at least 6 weeks and consider transitioning back to aspirin  Fabienne Bruns, MD [facility] [place] Office: [phone] Pager: [phone]

## 2013-03-11 ENCOUNTER — Other Ambulatory Visit: Payer: Self-pay | Admitting: *Deleted

## 2013-03-11 DIAGNOSIS — IMO0001 Reserved for inherently not codable concepts without codable children: Secondary | ICD-10-CM

## 2013-03-12 ENCOUNTER — Other Ambulatory Visit: Payer: Self-pay | Admitting: *Deleted

## 2013-03-12 DIAGNOSIS — I743 Embolism and thrombosis of arteries of the lower extremities: Secondary | ICD-10-CM

## 2013-03-14 ENCOUNTER — Other Ambulatory Visit: Payer: Self-pay | Admitting: *Deleted

## 2013-03-14 ENCOUNTER — Encounter: Payer: BC Managed Care – PPO | Admitting: Vascular Surgery

## 2013-03-14 DIAGNOSIS — I82409 Acute embolism and thrombosis of unspecified deep veins of unspecified lower extremity: Secondary | ICD-10-CM

## 2013-04-03 ENCOUNTER — Encounter: Payer: Self-pay | Admitting: Vascular Surgery

## 2013-04-04 ENCOUNTER — Ambulatory Visit (INDEPENDENT_AMBULATORY_CARE_PROVIDER_SITE_OTHER): Payer: BC Managed Care – PPO | Admitting: Vascular Surgery

## 2013-04-04 ENCOUNTER — Encounter: Payer: Self-pay | Admitting: Vascular Surgery

## 2013-04-04 VITALS — BP 135/94 | HR 98 | Ht 72.0 in | Wt 345.2 lb

## 2013-04-04 DIAGNOSIS — I749 Embolism and thrombosis of unspecified artery: Secondary | ICD-10-CM

## 2013-04-04 NOTE — Progress Notes (Signed)
Patient is  a 48 year old male who is status post left popliteal and tibial embolectomy followed by left external iliac artery stenting approximately one month ago. He also had 4 compartment fasciotomy. He has a VAC an anterior fasciotomy currently. He still has some numbness in the foot but overall this is improved. He is currently taking oxycodone for the pain with some relief.  Physical exam:    Filed Vitals:   04/04/13 1524  BP: 135/94  Pulse: 98  Height: 6' (1.829 m)  Weight: 345 lb 3.2 oz (156.582 kg)  SpO2: 100%    Left lower extremity: 2+ left femoral pulse Biphasic posterior tibial and dorsalis pedis Doppler signals, lateral fasciotomy wound clean with healthy appearing granulation tissue, healing incision medial calf  Assessment: Patent left external iliac stent healing fasciotomy.   Plan: At this point, the patient's fasciotomy wound is clean and healthy in appearance. I discussed the patient today the possibility of skin grafting versus continued healing by secondary intention. I do not believe this needs a VAC at this point. He has opted for healing by secondary intention. We will do hydrogel dressings once daily. He'll return for followup in one month. Staples were removed from his leg today.   Continue Plavix until his next appointment and consider transitioning back to aspirin  Ruta Hinds, MD [facility] [place]: [phone] Pager: [phone]

## 2013-04-09 ENCOUNTER — Telehealth: Payer: Self-pay | Admitting: *Deleted

## 2013-04-09 NOTE — Telephone Encounter (Signed)
[facility] nurse, Alroy Dust called asking for new orders since removal of the wound vac.  Dr Oneida Alar ordered hydrogel daily applied to fasciotomy wounds. Rodman Key will be providing wound care on Monday, Tuesday and Wednesdays and has instructed the patient's wife on doing the wound care on the days he is not there.

## 2013-04-18 ENCOUNTER — Ambulatory Visit (INDEPENDENT_AMBULATORY_CARE_PROVIDER_SITE_OTHER): Payer: BC Managed Care – PPO | Admitting: Vascular Surgery

## 2013-04-18 ENCOUNTER — Encounter: Payer: Self-pay | Admitting: Vascular Surgery

## 2013-04-18 VITALS — BP 136/88 | HR 90 | Ht 72.0 in | Wt 345.0 lb

## 2013-04-18 DIAGNOSIS — Z48812 Encounter for surgical aftercare following surgery on the circulatory system: Secondary | ICD-10-CM

## 2013-04-18 DIAGNOSIS — I743 Embolism and thrombosis of arteries of the lower extremities: Secondary | ICD-10-CM

## 2013-04-18 NOTE — Progress Notes (Signed)
Patient is  a 48 year old male who is status post left popliteal and tibial embolectomy followed by left external iliac artery stenting December 2014. He also had 4 compartment fasciotomy. He is still doing local wound care to an open left leg fasciotomy. He still has some numbness in the foot but overall this is improved.   Physical exam:    Filed Vitals:   04/18/13 0846  BP: 136/88  Pulse: 90  Height: 6' (1.829 m)  Weight: 345 lb (156.491 kg)  SpO2: 100%    Left lower extremity: 2+ left femoral pulse Biphasic posterior tibial and dorsalis pedis Doppler signals, lateral fasciotomy wound clean with healthy appearing granulation tissue, healing incision medial calf  Assessment: Patent left external iliac stent healing fasciotomy.   Plan: At this point, the patient's fasciotomy wound is clean and healthy in appearance. We will do hydrogel dressings once daily. He'll return for followup in 6 months. He will have arterial duplex and ABIs at that point. Continue Plavix until his next appointment and consider transitioning back to aspirin. He was given a note to return to work in February 2.  Ruta Hinds, MD [facility] [place] Office: [phone] Pager: [phone]

## 2013-04-25 ENCOUNTER — Telehealth: Payer: Self-pay | Admitting: *Deleted

## 2013-04-25 NOTE — Telephone Encounter (Signed)
Rodman Key, nurse from [facility] called stating that patient has been discharged from home care.

## 2013-05-04 ENCOUNTER — Ambulatory Visit: Payer: BC Managed Care – PPO | Admitting: Family Medicine

## 2013-05-04 ENCOUNTER — Ambulatory Visit: Payer: BC Managed Care – PPO

## 2013-05-04 VITALS — BP 120/90 | HR 91 | Temp 98.6°F | Resp 18 | Ht 71.5 in | Wt 350.0 lb

## 2013-05-04 DIAGNOSIS — Z0289 Encounter for other administrative examinations: Secondary | ICD-10-CM

## 2013-05-04 NOTE — Patient Instructions (Signed)
See other form.  3 month waiting period after stent of artery - return anytime after 05/29/13 to discuss the leg wound and to determine if you can get a 1 year card at that time.

## 2013-05-04 NOTE — Progress Notes (Addendum)
This chart was scribed for [name], MD by Einar Pheasant, ED Scribe. This patient was seen in room 1 and the patient's care was started at 2:24 PM. Subjective:    Patient ID: Carlos Phillips, male    DOB: 28-Apr-1965, 48 y.o.   MRN: [id number] Authored by Janeann Forehand, MD, unable to change in [facility].    Chief Complaint  Patient presents with   DOT Physical    HPI HPI Comments: Carlos Phillips is a 48 y.o. male who presents to [facility] requesting a DOT physical. History of hypertension but also on review of medical history noted to have been hospitalized December 1-8 in 2014. 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 He had left tibial embolectomy with patch angioplasty of left popliteal artery and tibial peroneal trunk, four compartment fasciotomy. He was placed on a heparin gtt post operatively. Sent home with a wound vac and discharged on plavix. Most recently seen by vascular surgeon on 04/29/13. Wound care with hydrogel qd with planned follow up in six months. He was told to continue the prescribed plavix and a note was given to return to work on 04/29/13. Vascular surgery performed 02/28/13.  Pt states that he had a blood clot on the back of his knee and that's why he was prescribed the plavix. He currently works as a Financial planner. Pt states that he has been driving with occasional walking, as advised by vascular surgeon. Pt reports full usage of his leg. He states that he has stopped taking narcotic pain medication for his leg. He does report occasional usage of motrin to manage pain. Pt reports changing his leg dressing every day.       Patient Active Problem List   Diagnosis Date Noted   Embolism 03/07/2013   Ischemia of extremity 02/25/2013   HTN (hypertension) 05/07/2011   Erectile dysfunction 05/07/2011   Obesity 05/07/2011   Past Medical History  Diagnosis Date   Hypertension    Past Surgical History    Procedure Laterality Date   Embolectomy Left 02/25/2013    Procedure: Left Popliteal EMBOLECTOMY Poss fasciotomy;  Surgeon: Elam Dutch, MD;  Location: [place];  Service: Vascular;  Laterality: Left;  Left poplital and Tibial embolectomy with four compartment Fasciotomy with vein patch angioplasty left popliteal artery.   No Known Allergies Prior to Admission medications   Medication Sig Start Date End Date Taking? Authorizing Provider  amLODipine (NORVASC) 10 MG tablet Take 1 tablet (10 mg total) by mouth daily. 05/05/12  Yes Darlyne Russian, MD  atorvastatin (LIPITOR) 10 MG tablet Take 1 tablet (10 mg total) by mouth daily. 03/04/13  Yes Samantha J Rhyne, PA-C  cloNIDine (CATAPRES) 0.2 MG tablet Take 1 tablet twice a day . 05/05/12  Yes Darlyne Russian, MD  clopidogrel (PLAVIX) 75 MG tablet Take 1 tablet (75 mg total) by mouth daily with breakfast. 03/01/13  Yes Samantha J Rhyne, PA-C  hydrochlorothiazide (HYDRODIURIL) 25 MG tablet Take 1 tablet (25 mg total) by mouth daily. 05/05/12  Yes Darlyne Russian, MD  Multiple Vitamins-Minerals (MULTIVITAMIN WITH MINERALS) tablet Take 1 tablet by mouth daily.   Yes Historical Provider, MD  sildenafil (VIAGRA) 100 MG tablet Take 0.5-1 tablets (50-100 mg total) by mouth daily as needed for erectile dysfunction. 05/05/12 05/04/13 Yes Darlyne Russian, MD  oxyCODONE (ROXICODONE) 5 MG immediate release tablet Take 1-2 tablets (5-10 mg total) by mouth every 6 (six) hours as needed  for moderate pain or severe pain. 03/01/13   Samantha J Rhyne, PA-C  oxyCODONE-acetaminophen (PERCOCET/ROXICET) 5-325 MG per tablet Take 1 tablet by mouth every 4 (four) hours as needed for severe pain. 03/07/13   Elam Dutch, MD   History   Social History   Marital Status: Married    Spouse Name: N/A    Number of Children: N/A   Years of Education: N/A   Occupational History   Not on file.   Social History Main Topics   Smoking status: Former Smoker -- 1.00 packs/day    Types:  Cigarettes   Smokeless tobacco: Never Used   Alcohol Use: No   Drug Use: No   Sexual Activity: Yes   Other Topics Concern   Not on file   Social History Narrative   No narrative on file     Review of Systems  Per DOT form - additionally, as above, adn denies chest pain, dyspnea, leg weakness.     Objective:   Physical Exam  Nursing note and vitals reviewed. Constitutional: He is oriented to person, place, and time. He appears well-developed and well-nourished. No distress.  HENT:  Head: Normocephalic and atraumatic.  Right Ear: External ear normal.  Left Ear: External ear normal.  Mouth/Throat: Oropharynx is clear and moist. No oropharyngeal exudate.  Eyes: EOM are normal. Pupils are equal, round, and reactive to light.  Neck: Neck supple. No tracheal deviation present.  Cardiovascular: Normal rate, regular rhythm and normal heart sounds.  Exam reveals no gallop and no friction rub.   No murmur heard. Trace edema of left and right ankle.   Pulmonary/Chest: Effort normal and breath sounds normal. No respiratory distress. He has no wheezes. He has no rales.  Musculoskeletal: Normal range of motion.  Left leg: a 6-8 cm flat wound.  Lymphadenopathy:    He has no cervical adenopathy.  Neurological: He is alert and oriented to person, place, and time.  Skin: Skin is warm and dry.  Psychiatric: He has a normal mood and affect. His behavior is normal.   Moving extremities normally.   Filed Vitals:   05/04/13 1323  BP: 120/90  Pulse: 91  Temp: 98.6 F (37 C)  TempSrc: Oral  Resp: 18  Height: 5' 11.5" (1.816 m)  Weight: 350 lb (158.759 kg)  SpO2: 98%    Visual Acuity Screening   Right eye Left eye Both eyes  Without correction: 20/20 20/15 20/15-1  With correction:     Comments: Peripheral Vision:Right eye 85 degrees.Left eye 85 degrees.The patient can distinguish the colors red, amber and green.The patient was able to hear a forced whisper from 10 feet.       Assessment & Plan:   Carlos Phillips is a 48 y.o. male Health examination of defined subpopulation  Disqualified today d/t vascular angioplasty 02/28/13 - 3 month waiting period needed. Can return after 05/29/13 - anticipate 1 year card then if stable, as already released by vascular surgeon. Sooner if worse.    No orders of the defined types were placed in this encounter.   Patient Instructions  See other form.  3 month waiting period after stent of artery - return anytime after 05/29/13 to discuss the leg wound and to determine if you can get a 1 year card at that time.

## 2013-05-08 ENCOUNTER — Encounter: Payer: Self-pay | Admitting: Vascular Surgery

## 2013-05-09 ENCOUNTER — Ambulatory Visit: Payer: BC Managed Care – PPO | Admitting: Vascular Surgery

## 2013-05-24 ENCOUNTER — Other Ambulatory Visit: Payer: Self-pay | Admitting: Emergency Medicine

## 2013-05-29 ENCOUNTER — Ambulatory Visit: Payer: Self-pay | Admitting: Family Medicine

## 2013-05-29 ENCOUNTER — Ambulatory Visit (INDEPENDENT_AMBULATORY_CARE_PROVIDER_SITE_OTHER): Payer: BC Managed Care – PPO | Admitting: Family Medicine

## 2013-05-29 VITALS — BP 122/80 | HR 70 | Temp 98.0°F | Resp 16 | Ht 72.0 in | Wt 364.0 lb

## 2013-05-29 DIAGNOSIS — S81009A Unspecified open wound, unspecified knee, initial encounter: Secondary | ICD-10-CM

## 2013-05-29 DIAGNOSIS — I10 Essential (primary) hypertension: Secondary | ICD-10-CM

## 2013-05-29 DIAGNOSIS — Z0000 Encounter for general adult medical examination without abnormal findings: Secondary | ICD-10-CM

## 2013-05-29 DIAGNOSIS — E785 Hyperlipidemia, unspecified: Secondary | ICD-10-CM

## 2013-05-29 DIAGNOSIS — Z0289 Encounter for other administrative examinations: Secondary | ICD-10-CM

## 2013-05-29 DIAGNOSIS — S91009A Unspecified open wound, unspecified ankle, initial encounter: Secondary | ICD-10-CM

## 2013-05-29 DIAGNOSIS — M25569 Pain in unspecified knee: Secondary | ICD-10-CM

## 2013-05-29 DIAGNOSIS — S81809A Unspecified open wound, unspecified lower leg, initial encounter: Secondary | ICD-10-CM

## 2013-05-29 LAB — COMPREHENSIVE METABOLIC PANEL
ALT: 16 U/L (ref 0–53)
AST: 16 U/L (ref 0–37)
Albumin: 4.1 g/dL (ref 3.5–5.2)
Alkaline Phosphatase: 68 U/L (ref 39–117)
BUN: 13 mg/dL (ref 6–23)
CO2: 30 mEq/L (ref 19–32)
Calcium: 9.3 mg/dL (ref 8.4–10.5)
Chloride: 101 mEq/L (ref 96–112)
Creat: 1.2 mg/dL (ref 0.50–1.35)
Glucose, Bld: 91 mg/dL (ref 70–99)
Potassium: 3.8 mEq/L (ref 3.5–5.3)
Sodium: 138 mEq/L (ref 135–145)
Total Bilirubin: 0.3 mg/dL (ref 0.2–1.2)
Total Protein: 6.5 g/dL (ref 6.0–8.3)

## 2013-05-29 LAB — LIPID PANEL
Cholesterol: 166 mg/dL (ref 0–200)
HDL: 55 mg/dL (ref >39)
LDL Cholesterol: 88 mg/dL (ref 0–99)
Total CHOL/HDL Ratio: 3 Ratio
Triglycerides: 114 mg/dL (ref <150)
VLDL: 23 mg/dL (ref 0–40)

## 2013-05-29 MED ORDER — HYDROCHLOROTHIAZIDE 25 MG PO TABS: ORAL_TABLET | ORAL | Status: DC

## 2013-05-29 MED ORDER — CLONIDINE HCL 0.2 MG PO TABS: ORAL_TABLET | ORAL | Status: DC

## 2013-05-29 MED ORDER — AMLODIPINE BESYLATE 10 MG PO TABS: ORAL_TABLET | ORAL | Status: DC

## 2013-05-29 MED ORDER — ATORVASTATIN CALCIUM 10 MG PO TABS: 10.0000 mg | ORAL_TABLET | Freq: Every day | ORAL | Status: DC

## 2013-05-29 NOTE — Progress Notes (Signed)
Subjective:    Patient ID: Carlos Phillips, male    DOB: 01-Mar-1966, 48 y.o.   MRN: [id number] This chart was scribed for [name], MD by Vernell Barrier, Medical Scribe. This patient's care was started at 5:11 PM.  HPI HPI Comments: Carlos Phillips is a 48 y.o. male who presents to the Emergency Department for medication refill.   Last seen by me on 05/04/13 for DOT exam. Hx of HTN but been hospitalized 12/1-8/14 w/ iliac Occlusive disease with occlusion of thrombus versus plaque with left tibial embolectomy patch,  angioplasty of left popliteal artery and tibial peroneal trunk and 4 compartment fasciotomy. Placed on heparin drip post op. Sent home with a wound vac and discharged on Plavix(75 mg/day). Note was given from vascular surgeon to return to work on 04/29/13(Vascular surgery on 02/28/13).   HTN: Does not check BP regularly outside of doctor's office. Tries to take 2x/day but admits sometimes he does miss doses. No longer feels fatigued or tired during the day as was previously stated. Has been taking Viagra since last year; taking 1/2 pill but says he has not taken since last year. Last saw vascular doctor in January 2015.  Last EGFR on 03/04/13 was 88. Denies cough, SOB, chest pain, leg swelling, dizziness, light-headedness, hematochezia, or abdominal pain.  Hyperlipidemia: Lipitor refill. Denies any new muscle aches, Not fasting.   Lab Results  Component Value Date   CHOL 184 05/05/2012   HDL 46 05/05/2012   LDLCALC 118* 05/05/2012   TRIG 100 05/05/2012   CHOLHDL 4.0 05/05/2012   Patient Active Problem List   Diagnosis Date Noted  . Embolism 03/07/2013  . Ischemia of extremity 02/25/2013  . HTN (hypertension) 05/07/2011  . Erectile dysfunction 05/07/2011  . Obesity 05/07/2011   Past Medical History  Diagnosis Date  . Hypertension    Past Surgical History  Procedure Laterality Date  . Embolectomy Left 02/25/2013    Procedure: Left Popliteal EMBOLECTOMY Poss fasciotomy;   Surgeon: Elam Dutch, MD;  Location: [facility];  Service: Vascular;  Laterality: Left;  Left poplital and Tibial embolectomy with four compartment Fasciotomy with vein patch angioplasty left popliteal artery.   No Known Allergies Prior to Admission medications   Medication Sig Start Date End Date Taking? Authorizing Provider  amLODipine (NORVASC) 10 MG tablet TAKE ONE TABLET BY MOUTH EVERY DAY 05/24/13   Chelle S Jeffery, PA-C  atorvastatin (LIPITOR) 10 MG tablet Take 1 tablet (10 mg total) by mouth daily. 03/04/13   Samantha J Rhyne, PA-C  cloNIDine (CATAPRES) 0.2 MG tablet TAKE ONE TABLET BY MOUTH TWICE DAILY 05/24/13   Fara Chute, PA-C  clopidogrel (PLAVIX) 75 MG tablet Take 1 tablet (75 mg total) by mouth daily with breakfast. 03/01/13   Hulen Shouts Rhyne, PA-C  hydrochlorothiazide (HYDRODIURIL) 25 MG tablet TAKE ONE TABLET BY MOUTH EVERY DAY 05/24/13   Chelle S Jeffery, PA-C  Multiple Vitamins-Minerals (MULTIVITAMIN WITH MINERALS) tablet Take 1 tablet by mouth daily.    Historical Provider, MD  oxyCODONE (ROXICODONE) 5 MG immediate release tablet Take 1-2 tablets (5-10 mg total) by mouth every 6 (six) hours as needed for moderate pain or severe pain. 03/01/13   Samantha J Rhyne, PA-C  oxyCODONE-acetaminophen (PERCOCET/ROXICET) 5-325 MG per tablet Take 1 tablet by mouth every 4 (four) hours as needed for severe pain. 03/07/13   Elam Dutch, MD  sildenafil (VIAGRA) 100 MG tablet Take 0.5-1 tablets (50-100 mg total) by mouth daily as needed for erectile  dysfunction. 05/05/12 05/04/13  Darlyne Russian, MD   History   Social History  . Marital Status: Married    Spouse Name: N/A    Number of Children: N/A  . Years of Education: N/A   Occupational History  . Not on file.   Social History Main Topics  . Smoking status: Former Smoker -- 1.00 packs/day    Types: Cigarettes  . Smokeless tobacco: Never Used  . Alcohol Use: No  . Drug Use: No  . Sexual Activity: Yes   Other Topics Concern    . Not on file   Social History Narrative  . No narrative on file   Review of Systems  Constitutional: Negative for fatigue and unexpected weight change.  Eyes: Negative for visual disturbance.  Respiratory: Negative for cough, chest tightness and shortness of breath.   Cardiovascular: Negative for chest pain, palpitations and leg swelling.  Gastrointestinal: Negative for abdominal pain and blood in stool.  Musculoskeletal: Negative for myalgias.  Neurological: Negative for dizziness, light-headedness and headaches.   Objective:   Physical Exam  Vitals reviewed. Constitutional: He is oriented to person, place, and time. He appears well-developed and well-nourished.  HENT:  Head: Normocephalic and atraumatic.  Mouth/Throat: Oropharynx is clear and moist.  Eyes: EOM are normal. Pupils are equal, round, and reactive to light.  Neck: No JVD present. Carotid bruit is not present.  Cardiovascular: Normal rate, regular rhythm and normal heart sounds.   No murmur heard. Pulmonary/Chest: Effort normal and breath sounds normal. He has no rales.  Musculoskeletal: He exhibits no edema.  LEFT LEG: left lower leg injury with central granulation tissue. Minimal wet appearance. Central middle aspect at 3 cm x 8 mm with surrounding contracting scar. No surrounding erythema. No apparent discharge. 1+ pited edema on left. No significant edema on the right.   Lymphadenopathy:    He has no cervical adenopathy.  Neurological: He is alert and oriented to person, place, and time.  Skin: Skin is warm and dry.  Psychiatric: He has a normal mood and affect.   Filed Vitals:   05/29/13 1516  BP: 122/80  Pulse: 70  Temp: 98 F (36.7 C)  Resp: 16  Height: 6' (1.829 m)  Weight: 364 lb (165.109 kg)  SpO2: 98%    Assessment & Plan:    Carlos Phillips is a 48 y.o. male HTN (hypertension) - Plan: COMPLETE METABOLIC PANEL WITH GFR, Lipid panel, hydrochlorothiazide (HYDRODIURIL) 25 MG tablet,  cloNIDine (CATAPRES) 0.2 MG tablet, atorvastatin (LIPITOR) 10 MG tablet, amLODipine (NORVASC) 10 MG tablet.  Stable. Refilled meds. Labs pending.   Other and unspecified hyperlipidemia - Plan: COMPLETE METABOLIC PANEL WITH GFR, Lipid panel - check lipids, not truly fasting, but if elevated, can repeat 8 hrs fasting if needed.  Plan on CPE with me, but may need to have fasting labs on w/e d/t early morning work hrs.   Open wound of knee, leg (except thigh), and ankle, complicated - improving. Cont routine follow up with vascular surgeon. rtc precautions given.  Meds ordered this encounter  Medications  . hydrochlorothiazide (HYDRODIURIL) 25 MG tablet    Sig: TAKE ONE TABLET BY MOUTH EVERY DAY    Dispense:  90 tablet    Refill:  1  . cloNIDine (CATAPRES) 0.2 MG tablet    Sig: TAKE ONE TABLET BY MOUTH TWICE DAILY    Dispense:  180 tablet    Refill:  1  . atorvastatin (LIPITOR) 10 MG tablet    Sig:  Take 1 tablet (10 mg total) by mouth daily.    Dispense:  90 tablet    Refill:  1    Order Specific Question:  Supervising Provider    Answer:  Ruta Hinds E [[id number]]  . amLODipine (NORVASC) 10 MG tablet    Sig: TAKE ONE TABLET BY MOUTH EVERY DAY    Dispense:  90 tablet    Refill:  1   Patient Instructions  You should receive a call or letter about your lab results within the next week to 10 days.  We will call you about the physical. No change in blood pressure or cholesterol meds, and would recommend against using Viagra until discussed further at next office visit, given your vascular problem in leg.  Return to the clinic or go to the nearest emergency room if any of your symptoms worsen or new symptoms occur.     I personally performed the services described in this documentation, which was scribed in my presence. The recorded information has been reviewed and considered, and addended by me as needed.

## 2013-05-29 NOTE — Progress Notes (Addendum)
   Subjective:    Patient ID: Carlos Phillips, male    DOB: [birth year], 48 y.o.   MRN: [id number] This chart was scribed for [name], MD by Vernell Barrier, Medical Scribe. This patient's care was started at 5:35 PM.  HPI HPI Comments: Carlos Phillips is a 48 y.o. male who presents to the [facility] for a DOT physical.   Last DOT evaluation on 05/04/13. Status post vascular surgery on left lower leg on 02/28/13 with angioplasty. Disqualified at that time due to 3 month required waiting period. Has been released by vascular surgeon to return to work as of 04/29/13. Taking Plavix 75mg /qd. Denies bruising/bleeding or hematuria.   Review of Systems  Genitourinary: Negative for hematuria.  Skin: Positive for wound.   Objective:   Physical Exam  Vitals reviewed. Constitutional: He is oriented to person, place, and time. He appears well-developed and well-nourished.  HENT:  Head: Normocephalic and atraumatic.  Eyes: EOM are normal. Pupils are equal, round, and reactive to light.  Neck: No JVD present. Carotid bruit is not present.  Cardiovascular: Normal rate, regular rhythm and normal heart sounds.   No murmur heard. Pulmonary/Chest: Effort normal and breath sounds normal. He has no rales.  Musculoskeletal: He exhibits no edema.  LEFT LEG: Left lower leg injury with central granulation tissue. Sensation intact to distal toes.Cap refill less than 1 second. Central middle aspect at 3 cm x 8 mm with surrounding contracting scar. No surrounding erythema. No apparent discharge. Slight pited edema approximately 1+ into ankle and foot. No significant edema on the right. NVI intact. Minimal wet appearance.   Neurological: He is alert and oriented to person, place, and time.  Skin: Skin is warm and dry.  Psychiatric: He has a normal mood and affect.   Assessment & Plan:  Carlos Phillips is a 48 y.o. male DOT physical follow up.  Now satisfied 3 month wait period after  vascular surgery. Addendum to original ppw and card given. See scanned copies.   No orders of the defined types were placed in this encounter.   Patient Instructions  You may return to driving as you have completed 3 month waiting period after vascular surgery. Your card will be good for 1 year based on exam 05/04/13. (expires 05/04/14).  Let me know if you or your employer have any questions.

## 2013-05-29 NOTE — Patient Instructions (Signed)
You should receive a call or letter about your lab results within the next week to 10 days.  We will call you about the physical. No change in blood pressure or cholesterol meds, and would recommend against using Viagra until discussed further at next office visit, given your vascular problem in leg.  Return to the clinic or go to the nearest emergency room if any of your symptoms worsen or new symptoms occur.

## 2013-05-29 NOTE — Patient Instructions (Signed)
You may return to driving as you have completed 3 month waiting period after vascular surgery. Your card will be good for 1 year based on exam 05/04/13. (expires 05/04/14).  Let me know if you or your employer have any questions.

## 2013-06-03 ENCOUNTER — Telehealth: Payer: Self-pay | Admitting: General Practice

## 2013-06-03 NOTE — Telephone Encounter (Signed)
Called and left vm for patient to give [place] a call back to schedule an CPE w/Doctor Carlota Raspberry on 5/11

## 2013-06-05 ENCOUNTER — Encounter: Payer: Self-pay | Admitting: *Deleted

## 2013-06-22 ENCOUNTER — Other Ambulatory Visit: Payer: Self-pay | Admitting: Emergency Medicine

## 2013-06-26 NOTE — Telephone Encounter (Signed)
We discussed returning to discuss the safety of this medicine at his last ov due to vascular issue in leg.  Can walk in or by appt.

## 2013-06-27 NOTE — Telephone Encounter (Signed)
LMOM to CB. 

## 2013-06-27 NOTE — Telephone Encounter (Signed)
Pt advised he needs OV to discuss safety of med. Pt agreed and stated that he will talk w/Dr Carlota Raspberry about it at his sch appt in May.

## 2013-08-05 ENCOUNTER — Encounter: Payer: Self-pay | Admitting: Family Medicine

## 2013-08-05 ENCOUNTER — Ambulatory Visit (INDEPENDENT_AMBULATORY_CARE_PROVIDER_SITE_OTHER): Payer: BC Managed Care – PPO | Admitting: Family Medicine

## 2013-08-05 VITALS — BP 122/80 | HR 79 | Temp 98.3°F | Resp 16 | Ht 72.0 in | Wt 367.2 lb

## 2013-08-05 DIAGNOSIS — E669 Obesity, unspecified: Secondary | ICD-10-CM

## 2013-08-05 DIAGNOSIS — E782 Mixed hyperlipidemia: Secondary | ICD-10-CM

## 2013-08-05 DIAGNOSIS — I739 Peripheral vascular disease, unspecified: Secondary | ICD-10-CM

## 2013-08-05 DIAGNOSIS — I10 Essential (primary) hypertension: Secondary | ICD-10-CM

## 2013-08-05 DIAGNOSIS — Z23 Encounter for immunization: Secondary | ICD-10-CM

## 2013-08-05 DIAGNOSIS — Z125 Encounter for screening for malignant neoplasm of prostate: Secondary | ICD-10-CM

## 2013-08-05 DIAGNOSIS — Z0000 Encounter for general adult medical examination without abnormal findings: Secondary | ICD-10-CM

## 2013-08-05 LAB — CBC
HCT: 45.2 % (ref 39.0–52.0)
Hemoglobin: 15.3 g/dL (ref 13.0–17.0)
MCH: 27.3 pg (ref 26.0–34.0)
MCHC: 33.8 g/dL (ref 30.0–36.0)
MCV: 80.6 fL (ref 78.0–100.0)
Platelets: 288 10*3/uL (ref 150–400)
RBC: 5.61 MIL/uL (ref 4.22–5.81)
RDW: 15.8 % — ABNORMAL HIGH (ref 11.5–15.5)
WBC: 9.2 10*3/uL (ref 4.0–10.5)

## 2013-08-05 LAB — TSH: TSH: 0.721 u[IU]/mL (ref 0.350–4.500)

## 2013-08-05 MED ORDER — TETANUS-DIPHTH-ACELL PERTUSSIS 5-2.5-18.5 LF-MCG/0.5 IM SUSP: 0.5000 mL | Freq: Once | INTRAMUSCULAR | Status: DC

## 2013-08-05 NOTE — Patient Instructions (Addendum)
150 minutes moderate intensity exercise each week and major muscle groups twice per week. Look at nutrition labels on foods. Avoid fast food as much as possible and cut back on sweet tea and other sugar containing beverages.   Check to see what type of cancer your father had and at what age. Let me know this information to determine if you need testing for this.   You should receive a call or letter about your lab results within the next week to 10 days.   Recheck in next 6 months.   Keeping you healthy  Get these tests  Blood pressure- Have your blood pressure checked once a year by your healthcare provider.  Normal blood pressure is 120/80.  Weight- Have your body mass index (BMI) calculated to screen for obesity.  BMI is a measure of body fat based on height and weight. You can also calculate your own BMI at [employer].it.  Cholesterol- Have your cholesterol checked regularly starting at age 68, sooner may be necessary if you have diabetes, high blood pressure, if a family member developed heart diseases at an early age or if you smoke.   Chlamydia, HIV, and other sexual transmitted disease- Get screened each year until the age of 45 then within three months of each new sexual partner.  Diabetes- Have your blood sugar checked regularly if you have high blood pressure, high cholesterol, a family history of diabetes or if you are overweight.  Get these vaccines  Flu shot- Every fall.  Tetanus shot- Every 10 years.  Menactra- Single dose; prevents meningitis.  Take these steps  Don't smoke- If you do smoke, ask your healthcare provider about quitting. For tips on how to quit, go to www.smokefree.gov or call 1-800-QUIT-NOW.  Be physically active- Exercise 5 days a week for at least 30 minutes.  If you are not already physically active start slow and gradually work up to 30 minutes of moderate physical activity.  Examples of moderate activity include walking briskly,  mowing the yard, dancing, swimming bicycling, etc.  Eat a healthy diet- Eat a variety of healthy foods such as fruits, vegetables, low fat milk, low fat cheese, yogurt, lean meats, poultry, fish, beans, tofu, etc.  For more information on healthy eating, go to www.thenutritionsource.org  Drink alcohol in moderation- Limit alcohol intake two drinks or less a day.  Never drink and drive.  Dentist- Brush and floss teeth twice daily; visit your dentis twice a year.  Depression-Your emotional health is as important as your physical health.  If you're feeling down, losing interest in things you normally enjoy please talk with your healthcare provider.  Gun Safety- If you keep a gun in your home, keep it unloaded and with the safety lock on.  Bullets should be stored separately.  Helmet use- Always wear a helmet when riding a motorcycle, bicycle, rollerblading or skateboarding.  Safe sex- If you may be exposed to a sexually transmitted infection, use a condom  Seat belts- Seat bels can save your life; always wear one.  Smoke/Carbon Monoxide detectors- These detectors need to be installed on the appropriate level of your home.  Replace batteries at least once a year.  Skin Cancer- When out in the sun, cover up and use sunscreen SPF 15 or higher.  Violence- If anyone is threatening or hurting you, please tell your healthcare provider.

## 2013-08-05 NOTE — Progress Notes (Signed)
This chart was scribed for Wendie Agreste, MD by Marcha Dutton, ED Scribe. This patient was seen in room 22 and the patient's care was started at 3:59 PM.  Subjective:    Patient ID: Carlos Phillips, male    DOB: October 17, 1965, 48 y.o.   MRN: [id number]   Chief Complaint  Patient presents with  . Annual Exam    per patient no refills, has aboutn 2 months left    HPI ID: Carlos Phillips is a 48 y.o. male  PCP: No primary provider on file.  PVD: Pt has h/o HTN and status post vascular surgery 02/28/2013 with left tibial embolectomy and patch angioplasty of left popliteal artery and tibial peroneal trunk as well as four compartment fasciotomy. Most recent vascular surgeon evaluation 04/29/2013 with plan to f/u in 6 months. Pt is on Plavix for PVD. Recent DOT physical in February. Cleared to drive on 07/30/7320. He reports some numbness and tingling in his left foot but reports the wound is healing well. He states his next f/u is 09/25/2013. He reports he can move all his toes and they are staying warm.  HTN: Pt stable at 05/29/2013 visit refilled meds then. Creatinine was 1.20. Other electrolytes okay. Pt had EKG in 02/2013 Sinus Rhythm: PVC noted, possible left atrial enlargement. Pt states he hasn't been checking his BP at home but denies any side effects with his medications. Pt's BP is 122/80 at triage. Pt denies melena, dizziness, lightheadedness, chest pain, SOB, and leg swelling. He states he quit smoking after his surgery in December. He had been smoking since 17.   HLD: Pt takes Lipitor 10 mg QD. He denies any muscle or joint pain with his Lipitor. Lipid panel in march.  Lab Results  Component Value Date   CHOL 166 05/29/2013   HDL 55 05/29/2013   LDLCALC 88 05/29/2013   TRIG 114 05/29/2013   CHOLHDL 3.0 05/29/2013   CPE: Pt unsure if his last tetanus shot was administered with his surgery in December of 2015. Pt denies family h/o prostate cancer. He states he had a PSA test via Dr.  Everlene Farrier in 04/2012 0.48. Pt reports his father had cancer but died of a MI. He notes he is unsure what kind of cancer his father had and at what age. He states his mother died of cirrhosis of the liver in her 17s but quit drinking in her 70s. Pt denies a family h/o colon cancer. He states he exercises about once a week and reports he just got a gym membership. Pt declines STI testing and states he and his wife are monogamous. Pt states he drinks sweet tea and the occasional soda. Pt denies any back pain with movement or twisting.  Pt reports he is back driving and work is going well. He states his wife is doing well.   Patient Active Problem List   Diagnosis Date Noted  . Embolism 03/07/2013  . Ischemia of extremity 02/25/2013  . HTN (hypertension) 05/07/2011  . Erectile dysfunction 05/07/2011  . Obesity 05/07/2011    Past Medical History  Diagnosis Date  . Hypertension     Past Surgical History  Procedure Laterality Date  . Embolectomy Left 02/25/2013    Procedure: Left Popliteal EMBOLECTOMY Poss fasciotomy;  Surgeon: Elam Dutch, MD;  Location: [facility] OR;  Service: Vascular;  Laterality: Left;  Left poplital and Tibial embolectomy with four compartment Fasciotomy with vein patch angioplasty left popliteal artery.    No Known  Allergies   Prior to Admission medications   Medication Sig Start Date End Date Taking? Authorizing Provider  amLODipine (NORVASC) 10 MG tablet TAKE ONE TABLET BY MOUTH EVERY DAY 05/29/13   Wendie Agreste, MD  atorvastatin (LIPITOR) 10 MG tablet Take 1 tablet (10 mg total) by mouth daily. 05/29/13   Wendie Agreste, MD  cloNIDine (CATAPRES) 0.2 MG tablet TAKE ONE TABLET BY MOUTH TWICE DAILY 05/29/13   Wendie Agreste, MD  clopidogrel (PLAVIX) 75 MG tablet Take 1 tablet (75 mg total) by mouth daily with breakfast. 03/01/13   Hulen Shouts Rhyne, PA-C  hydrochlorothiazide (HYDRODIURIL) 25 MG tablet TAKE ONE TABLET BY MOUTH EVERY DAY 05/29/13   Wendie Agreste, MD  Multiple  Vitamins-Minerals (MULTIVITAMIN WITH MINERALS) tablet Take 1 tablet by mouth daily.    Historical Provider, MD  oxyCODONE (ROXICODONE) 5 MG immediate release tablet Take 1-2 tablets (5-10 mg total) by mouth every 6 (six) hours as needed for moderate pain or severe pain. 03/01/13   Samantha J Rhyne, PA-C  oxyCODONE-acetaminophen (PERCOCET/ROXICET) 5-325 MG per tablet Take 1 tablet by mouth every 4 (four) hours as needed for severe pain. 03/07/13   Elam Dutch, MD  sildenafil (VIAGRA) 100 MG tablet Take 0.5-1 tablets (50-100 mg total) by mouth daily as needed for erectile dysfunction. 05/05/12 05/04/13  Darlyne Russian, MD    History   Social History  . Marital Status: Married    Spouse Name: N/A    Number of Children: N/A  . Years of Education: N/A   Occupational History  . Not on file.   Social History Main Topics  . Smoking status: Former Smoker -- 1.00 packs/day    Types: Cigarettes  . Smokeless tobacco: Never Used  . Alcohol Use: No  . Drug Use: No  . Sexual Activity: Yes   Other Topics Concern  . Not on file   Social History Narrative  . No narrative on file    Review of Systems  Constitutional: Negative for fatigue and unexpected weight change.  Eyes: Negative for visual disturbance.  Respiratory: Negative for cough, chest tightness and shortness of breath.   Cardiovascular: Negative for chest pain, palpitations and leg swelling.  Gastrointestinal: Negative for abdominal pain and blood in stool.  Neurological: Negative for dizziness, light-headedness and headaches.  All other systems reviewed and are negative. ROS reviewed per staff note.      Objective:   Physical Exam  Nursing note and vitals reviewed. Constitutional: He is oriented to person, place, and time. He appears well-developed and well-nourished.  HENT:  Head: Normocephalic and atraumatic.  Eyes: Conjunctivae and EOM are normal. Pupils are equal, round, and reactive to light.  Neck: Normal range of  motion. Neck supple. No JVD present. Carotid bruit is not present. No thyromegaly present.  Cardiovascular: Normal rate, regular rhythm, normal heart sounds and intact distal pulses.  Exam reveals no gallop and no friction rub.   No murmur heard. Pulmonary/Chest: Effort normal and breath sounds normal. No respiratory distress. He has no rales.  Abdominal: Soft. Bowel sounds are normal. He exhibits no distension. There is no tenderness. There is no rebound and no guarding. Hernia confirmed negative in the right inguinal area and confirmed negative in the left inguinal area.  Genitourinary: Rectum normal, prostate normal and penis normal. No penile tenderness.  No external rash, no apparent nodules, difficult to reach distal prostate, but no apparent nodules there either.  Musculoskeletal: Normal range of motion. He exhibits  no edema.  Stasis changes and darkening to the left calf. Healed wound medial knee and healing wound left lateral calf, no erythema or discharge.  Lymphadenopathy:    He has no cervical adenopathy.  Neurological: He is alert and oriented to person, place, and time.  Skin: Skin is warm and dry. No rash noted.  Psychiatric: He has a normal mood and affect. His behavior is normal.    Triage Vitals: BP 122/80  Pulse 79  Temp(Src) 98.3 F (36.8 C) (Oral)  Resp 16  Ht 6' (1.829 m)  Wt 367 lb 3.2 oz (166.561 kg)  BMI 49.79 kg/m2  SpO2 98%    Visual Acuity Screening   Right eye Left eye Both eyes  Without correction: 20/20 20/20 20/20   With correction:        Body mass index is 49.79 kg/(m^2).   Assessment & Plan:   THADDEUS EVITTS is a 48 y.o. male HTN (hypertension) -   -controlled. No changes. Labs reviewed form OV 2 months ago.   PVD (peripheral vascular disease) - wound healing on leg. Stable. Continue plavix and routine follow up with vascular surgeon. Commended on cessation of smoking.   Mixed hyperlipidemia - continue lipitor.   Routine general  medical examination at a health care facility - Plan: TSH, PSA, Tdap (BOOSTRIX) injection 0.5 mL  -anticipatory guidance discussed and h/o given. . Prostate cancer screening discussed and would like to have testing done. PSA pending.   Screening for prostate cancer - as above.   Obesity, unspecified - discussed concerns with morbid obesity, and BMI 49. Initial avoidance of extra calories with sugar containing drinks, and to look at nutrition labels. Also discussed exercise minimums.   Need for prophylactic vaccination with combined diphtheria-tetanus-pertussis (DTP) vaccine - tdap updated.    Meds ordered this encounter  Medications  . Tdap (BOOSTRIX) injection 0.5 mL    Sig:    Patient Instructions  150 minutes moderate intensity exercise each week and major muscle groups twice per week. Look at nutrition labels on foods. Avoid fast food as much as possible and cut back on sweet tea and other sugar containing beverages.   Check to see what type of cancer your father had and at what age. Let me know this information to determine if you need testing for this.   You should receive a call or letter about your lab results within the next week to 10 days.   Recheck in next 6 months.   Keeping you healthy  Get these tests  Blood pressure- Have your blood pressure checked once a year by your healthcare provider.  Normal blood pressure is 120/80.  Weight- Have your body mass index (BMI) calculated to screen for obesity.  BMI is a measure of body fat based on height and weight. You can also calculate your own BMI at [employer]velBags.it.  Cholesterol- Have your cholesterol checked regularly starting at age 69, sooner may be necessary if you have diabetes, high blood pressure, if a family member developed heart diseases at an early age or if you smoke.   Chlamydia, HIV, and other sexual transmitted disease- Get screened each year until the age of 75 then within three months of each new  sexual partner.  Diabetes- Have your blood sugar checked regularly if you have high blood pressure, high cholesterol, a family history of diabetes or if you are overweight.  Get these vaccines  Flu shot- Every fall.  Tetanus shot- Every 10 years.  Menactra- Single  dose; prevents meningitis.  Take these steps  Don't smoke- If you do smoke, ask your healthcare provider about quitting. For tips on how to quit, go to www.smokefree.gov or call 1-800-QUIT-NOW.  Be physically active- Exercise 5 days a week for at least 30 minutes.  If you are not already physically active start slow and gradually work up to 30 minutes of moderate physical activity.  Examples of moderate activity include walking briskly, mowing the yard, dancing, swimming bicycling, etc.  Eat a healthy diet- Eat a variety of healthy foods such as fruits, vegetables, low fat milk, low fat cheese, yogurt, lean meats, poultry, fish, beans, tofu, etc.  For more information on healthy eating, go to www.thenutritionsource.org  Drink alcohol in moderation- Limit alcohol intake two drinks or less a day.  Never drink and drive.  Dentist- Brush and floss teeth twice daily; visit your dentis twice a year.  Depression-Your emotional health is as important as your physical health.  If you're feeling down, losing interest in things you normally enjoy please talk with your healthcare provider.  Gun Safety- If you keep a gun in your home, keep it unloaded and with the safety lock on.  Bullets should be stored separately.  Helmet use- Always wear a helmet when riding a motorcycle, bicycle, rollerblading or skateboarding.  Safe sex- If you may be exposed to a sexually transmitted infection, use a condom  Seat belts- Seat bels can save your life; always wear one.  Smoke/Carbon Monoxide detectors- These detectors need to be installed on the appropriate level of your home.  Replace batteries at least once a year.  Skin Cancer- When out in the  sun, cover up and use sunscreen SPF 15 or higher.  Violence- If anyone is threatening or hurting you, please tell your healthcare provider.    I personally performed the services described in this documentation, which was scribed in my presence. The recorded information has been reviewed and considered, and addended by me as needed.

## 2013-08-06 LAB — PSA: PSA: 0.37 ng/mL (ref <=4.00)

## 2013-08-18 ENCOUNTER — Encounter: Payer: Self-pay | Admitting: Radiology

## 2013-10-23 ENCOUNTER — Encounter: Payer: Self-pay | Admitting: Vascular Surgery

## 2013-10-24 ENCOUNTER — Encounter: Payer: Self-pay | Admitting: Vascular Surgery

## 2013-10-24 ENCOUNTER — Other Ambulatory Visit: Payer: Self-pay | Admitting: Emergency Medicine

## 2013-10-24 ENCOUNTER — Other Ambulatory Visit: Payer: Self-pay | Admitting: Vascular Surgery

## 2013-10-24 ENCOUNTER — Ambulatory Visit (HOSPITAL_COMMUNITY)
Admission: RE | Admit: 2013-10-24 | Discharge: 2013-10-24 | Disposition: A | Discharge status: Home / Self Care | Payer: BC Managed Care – PPO | Source: Ambulatory Visit | Attending: Vascular Surgery | Admitting: Vascular Surgery

## 2013-10-24 ENCOUNTER — Ambulatory Visit (INDEPENDENT_AMBULATORY_CARE_PROVIDER_SITE_OTHER): Payer: BC Managed Care – PPO | Admitting: Vascular Surgery

## 2013-10-24 ENCOUNTER — Ambulatory Visit (INDEPENDENT_AMBULATORY_CARE_PROVIDER_SITE_OTHER)
Admission: RE | Admit: 2013-10-24 | Discharge: 2013-10-24 | Disposition: A | Discharge status: Home / Self Care | Payer: BC Managed Care – PPO | Source: Ambulatory Visit | Attending: Vascular Surgery | Admitting: Vascular Surgery

## 2013-10-24 VITALS — BP 133/83 | HR 69 | Ht 72.0 in | Wt 377.0 lb

## 2013-10-24 DIAGNOSIS — I743 Embolism and thrombosis of arteries of the lower extremities: Secondary | ICD-10-CM

## 2013-10-24 DIAGNOSIS — E785 Hyperlipidemia, unspecified: Secondary | ICD-10-CM

## 2013-10-24 DIAGNOSIS — I749 Embolism and thrombosis of unspecified artery: Secondary | ICD-10-CM

## 2013-10-24 DIAGNOSIS — I739 Peripheral vascular disease, unspecified: Secondary | ICD-10-CM

## 2013-10-24 DIAGNOSIS — Z87891 Personal history of nicotine dependence: Secondary | ICD-10-CM

## 2013-10-24 DIAGNOSIS — Z48812 Encounter for surgical aftercare following surgery on the circulatory system: Secondary | ICD-10-CM

## 2013-10-24 DIAGNOSIS — I10 Essential (primary) hypertension: Secondary | ICD-10-CM

## 2013-10-24 NOTE — Progress Notes (Signed)
Patient is  a 48 year old male who is status post left popliteal and tibial embolectomy followed by left external iliac artery stenting December 2014. He also had 4 compartment fasciotomy. His fasciotomy has completely healed. He has had intermittent swelling of his left lower extremity. This is improved with elevation. He is requesting a compression garment. He still has some numbness in the foot but overall this is improved. He denies claudication or rest pain. He has no nonhealing ulcers. He has returned to work full time. He is on Plavix and Lipitor. He has stopped smoking.  Current Outpatient Prescriptions on File Prior to Visit  Medication Sig Dispense Refill  . amLODipine (NORVASC) 10 MG tablet TAKE ONE TABLET BY MOUTH EVERY DAY  90 tablet  1  . atorvastatin (LIPITOR) 10 MG tablet Take 1 tablet (10 mg total) by mouth daily.  90 tablet  1  . cloNIDine (CATAPRES) 0.2 MG tablet TAKE ONE TABLET BY MOUTH TWICE DAILY  180 tablet  1  . clopidogrel (PLAVIX) 75 MG tablet Take 1 tablet (75 mg total) by mouth daily with breakfast.  30 tablet  12  . hydrochlorothiazide (HYDRODIURIL) 25 MG tablet TAKE ONE TABLET BY MOUTH EVERY DAY  90 tablet  1  . Multiple Vitamins-Minerals (MULTIVITAMIN WITH MINERALS) tablet Take 1 tablet by mouth daily.      Marland Kitchen oxyCODONE (ROXICODONE) 5 MG immediate release tablet Take 1-2 tablets (5-10 mg total) by mouth every 6 (six) hours as needed for moderate pain or severe pain.  30 tablet  0  . oxyCODONE-acetaminophen (PERCOCET/ROXICET) 5-325 MG per tablet Take 1 tablet by mouth every 4 (four) hours as needed for severe pain.  80 tablet  0  . sildenafil (VIAGRA) 100 MG tablet Take 0.5-1 tablets (50-100 mg total) by mouth daily as needed for erectile dysfunction.  5 tablet  11   Current Facility-Administered Medications on File Prior to Visit  Medication Dose Route Frequency Provider Last Rate Last Dose  . Tdap (BOOSTRIX) injection 0.5 mL  0.5 mL Intramuscular Once Wendie Agreste,  MD         Physical exam:     Filed Vitals:   10/24/13 1458  BP: 133/83  Pulse: 69  Height: 6' (1.829 m)  Weight: 377 lb (171.006 kg)  SpO2: 100%    Left lower extremity: 2+ left femoral pulse Biphasic posterior tibial and dorsalis pedis Doppler signals, he'll fasciotomy with  Data: Patient had a left lower extremity arterial duplex scan as well as bilateral ABIs today. I reviewed and interpreted these studies. ABI on the left was 0.65 with triphasic waveforms. The left arterial tree was patent throughout its course. There was occlusion of the peroneal artery. Right lower extremity ABI was 0.83 with triphasic waveforms. Toe pressure was greater than 100 bilaterally.  Assessment: Patent left external iliac stent but with slightly decreased ABIs bilaterally. This be secondary to the patient's body habitus. He is currently asymptomatic.  Plan: The patient was instructed that look for signs and symptoms of recurrent claudication symptoms or rest pain in the foot. If he has any these he will call me as soon as possible. Otherwise he will followup in 6 months with repeat ABIs. He was given a prescription today for knee-high 30-40 mm compression garments.  Ruta Hinds, MD [facility] Office: [phone] Pager: [phone]

## 2013-10-24 NOTE — Telephone Encounter (Signed)
PATIENT WOULD LIKE A REFILL FOR VIAGARA AND IS WANTING TO KNOW IF IT'S POSSIBLE TO TO GET A REFILL BEFORE THEY GO OUT OF TOWN THIS WEEKEND. PLEASE CALL!

## 2013-10-25 ENCOUNTER — Telehealth: Payer: Self-pay

## 2013-10-25 NOTE — Telephone Encounter (Signed)
Notified Pamala Hurry, nurse for Dr. Nyoka Cowden of Dr. Oneida Alar recommendations.  Will fax note with recommendations to Dr. Nyoka Cowden @ [phone]

## 2013-10-25 NOTE — Telephone Encounter (Signed)
LMOM for Dr Ruta Hinds' nurse, Arbie Cookey, asking her to check w/ Dr Oneida Alar for clearance for pt to use Viagra and call me back.

## 2013-10-25 NOTE — Telephone Encounter (Signed)
Would recommend he clears the use of this medicine with his vascular surgeon prior to [place] refilling it.  If vascular is ok with him taking this (can try to call over to their office today),  then we can refill it - 100mg  - 1/2-1 po qd prn. #5, 1 rf.

## 2013-10-25 NOTE — Telephone Encounter (Signed)
I received call and then fax w/Dr Nona Dell reply. Put fax in Dr Vonna Kotyk box, but will type in here for Dr Vonna Kotyk review in Dr Nona Dell words: "no known coronary disease but pts with PAD can certainly have coronary disease. Not comfortable commenting on whether or not he is ok from a cardiac standpoint but it will not hurt his legs."

## 2013-10-25 NOTE — Telephone Encounter (Signed)
[name] while I was at lunch and questioned Carlos Phillips about need for vascular approval, usually this comes from cardiologist. I called her back and advised that it doesn't appear that pt has a reg cardiologist and that Dr Carlota Raspberry specifically asked for me to get clearance of safety from the Vascular surgeon. Arbie Cookey agreed to send a message to Dr Oneida Alar but stated he is on call at the hosp so she doesn't know if he will get back w/answer today.

## 2013-10-25 NOTE — Telephone Encounter (Signed)
Dr Carlota Raspberry, I'm forwarding this to you since you are pt's PCP and he has not seen Dr Everlene Farrier in over a year.   Due to pt's req for this weekend will also send to PA pool in Dr Vonna Kotyk absence today.

## 2013-10-25 NOTE — Telephone Encounter (Signed)
Message copied by Denman George on Fri Oct 25, 2013  4:00 PM ------      Message from: Elam Dutch      Created: Fri Oct 25, 2013  3:53 PM      Regarding: RE: Re: pt. using Viagra       No known coronary disease but pts with PAD can certainly have coronary disease.  Not comfortable commenting on whether or not he is ok from cardiac standpoint but it will not hurt his legs            Juanda Crumble      ----- Message -----         From: Sherrye Payor, RN         Sent: 10/25/2013   3:41 PM           To: Elam Dutch, MD      Subject: Re: pt. using Viagra                                     Dr. Su Hilt office called from [facility], to request your opinion/ clearance on this pt. being prescribed Viagra.  I called to question if they intended to call his cardiologist, and was told the pt. doesn't have a cardiologist, and Dr. Nyoka Cowden specifically asked for your opinion.  He will actually write the Rx, but is requesting your opinion on this pt. using Viagra, from a Vascular standpoint.       ------

## 2013-10-27 NOTE — Telephone Encounter (Signed)
Noted reply from Dr. Oneida Alar. Agree that he has a risk of CAD with underlying PAD, and other risk factors of HTN, hyperlipidemia, and prior tobacco use, as well as parent with MI (father, but do not recall age). Recommend we have evaluated for stress testing/cardiology eval prior to refilling this medicine at this point. We can refer him to first available cardiologist for this if he is amenable. Let me know if there are any questions.

## 2013-10-28 NOTE — Telephone Encounter (Signed)
LMOM for pt to CB.  

## 2013-11-01 NOTE — Telephone Encounter (Signed)
Since we hadn't heard back called pt again and spoke to his wife (on HIPPA). She agreed to setting up cardio referral. Order put in.

## 2013-12-20 ENCOUNTER — Ambulatory Visit (INDEPENDENT_AMBULATORY_CARE_PROVIDER_SITE_OTHER): Payer: BC Managed Care – PPO | Admitting: Internal Medicine

## 2013-12-20 ENCOUNTER — Encounter: Payer: Self-pay | Admitting: Internal Medicine

## 2013-12-20 VITALS — BP 160/90 | HR 78 | Ht 72.0 in | Wt 378.0 lb

## 2013-12-20 DIAGNOSIS — E785 Hyperlipidemia, unspecified: Secondary | ICD-10-CM

## 2013-12-20 DIAGNOSIS — I10 Essential (primary) hypertension: Secondary | ICD-10-CM

## 2013-12-20 MED ORDER — SILDENAFIL CITRATE 100 MG PO TABS: 100.0000 mg | ORAL_TABLET | Freq: Every day | ORAL | Status: DC | PRN

## 2013-12-20 MED ORDER — ATORVASTATIN CALCIUM 20 MG PO TABS: 20.0000 mg | ORAL_TABLET | Freq: Every day | ORAL | Status: DC

## 2013-12-20 NOTE — Progress Notes (Signed)
HPI  Patinet is a 48 yo who was referred by Dr Nyoka Cowden Wants to take Viagra. Hx of fasciotomy in December. Hx HTN Drives front end garbage truck    Eats a lot at home . Problems with walking distances  Pain in L foot with walking  Has improved some since surgery  Denies CP Breathing is OK   During day eats fruit  It is a night when he eats a lot  Fried foods  No Known Allergies  Current Outpatient Prescriptions  Medication Sig Dispense Refill  . amLODipine (NORVASC) 10 MG tablet TAKE ONE TABLET BY MOUTH EVERY DAY  90 tablet  1  . atorvastatin (LIPITOR) 10 MG tablet Take 1 tablet (10 mg total) by mouth daily.  90 tablet  1  . cloNIDine (CATAPRES) 0.2 MG tablet TAKE ONE TABLET BY MOUTH TWICE DAILY  180 tablet  1  . clopidogrel (PLAVIX) 75 MG tablet Take 1 tablet (75 mg total) by mouth daily with breakfast.  30 tablet  12  . hydrochlorothiazide (HYDRODIURIL) 25 MG tablet TAKE ONE TABLET BY MOUTH EVERY DAY  90 tablet  1  . Multiple Vitamins-Minerals (MULTIVITAMIN WITH MINERALS) tablet Take 1 tablet by mouth daily.      Marland Kitchen oxyCODONE (ROXICODONE) 5 MG immediate release tablet Take 1-2 tablets (5-10 mg total) by mouth every 6 (six) hours as needed for moderate pain or severe pain.  30 tablet  0  . oxyCODONE-acetaminophen (PERCOCET/ROXICET) 5-325 MG per tablet Take 1 tablet by mouth every 4 (four) hours as needed for severe pain.  80 tablet  0  . sildenafil (VIAGRA) 100 MG tablet Take 0.5-1 tablets (50-100 mg total) by mouth daily as needed for erectile dysfunction.  5 tablet  11   Current Facility-Administered Medications  Medication Dose Route Frequency Provider Last Rate Last Dose  . Tdap (BOOSTRIX) injection 0.5 mL  0.5 mL Intramuscular Once Wendie Agreste, MD        Past Medical History  Diagnosis Date  . Hypertension   . DVT (deep venous thrombosis)     Past Surgical History  Procedure Laterality Date  . Embolectomy Left 02/25/2013    Procedure: Left Popliteal EMBOLECTOMY Poss  fasciotomy;  Surgeon: Elam Dutch, MD;  Location: [facility] OR;  Service: Vascular;  Laterality: Left;  Left poplital and Tibial embolectomy with four compartment Fasciotomy with vein patch angioplasty left popliteal artery.    Family History  Problem Relation Age of Onset  . Diabetes Mother   . Heart disease Father   . Asthma Son     History   Social History  . Marital Status: Married    Spouse Name: N/A    Number of Children: N/A  . Years of Education: N/A   Occupational History  . Not on file.   Social History Main Topics  . Smoking status: Former Smoker -- 1.00 packs/day    Types: Cigarettes  . Smokeless tobacco: Never Used  . Alcohol Use: No  . Drug Use: No  . Sexual Activity: Yes   Other Topics Concern  . Not on file   Social History Narrative  . No narrative on file    Review of Systems:  All systems reviewed.  They are negative to the above problem except as previously stated.  Vital Signs: BP 160/90  Pulse 78  Ht 6' (1.829 m)  Wt 378 lb (171.46 kg)  BMI 51.25 kg/m2  Physical Exam Patinet is in NAD HEENT:  Normocephalic, atraumatic. EOMI, PERRLA.  Neck: JVP is normal.  No bruits.  Lungs: clear to auscultation. No rales no wheezes.  Heart: Regular rate and rhythm. Normal S1, S2. No S3.   No significant murmurs. PMI not displaced.  Abdomen:  Supple, nontender. Normal bowel sounds. No masses. No hepatomegaly.  Extremities:   Good distal pulses throughout. No lower extremity edema.  Musculoskeletal :moving all extremities.  Neuro:   alert and oriented x3.  CN II-XII grossly intact.  EKG  SR 78 Assessment and Plan:  1.  Vascular  Patient with known PVOD  He most likely has CAD but I am not convinced of anything flow limiting I encouraged him to increase physical activity.  Get in pool  Stationary bike. Discussed symptoms of angina.  2.  HTN  BP is high today.  Lower at other doctors visits  Will need to b followed    3.  HL  He should have tighter  control of LDL  INcrease lipitor to 20  F/U lipids with Dr Carlota Raspberry in November  Goal:  70s  4.  Erectile dysfunction.  I Think the patient is OK to use Viagra.  Watch for anginal symptoms  5.  Morbid obesity  Patient knows he needs to lose wt.  Discussed diet and exercise  WIll f/U in 1 year, sooner if symtpoms develop.

## 2013-12-20 NOTE — Patient Instructions (Signed)
Your physician wants you to follow-up in: 1 year with Dr. Harrington Challenger. You will receive a reminder letter in the mail two months in advance. If you don't receive a letter, please call our office to schedule the follow-up appointment. Your physician has recommended you make the following change in your medication:  INCREASE LIPITOR TO 20 MG ONCE DAILY Your physician recommends that you return for lab work TO YOUR PCP OFFICE (NOVEMBER 9) ---LIPIDS, CBC, BMET, LFT

## 2014-02-03 ENCOUNTER — Ambulatory Visit (INDEPENDENT_AMBULATORY_CARE_PROVIDER_SITE_OTHER): Payer: BC Managed Care – PPO | Admitting: Family Medicine

## 2014-02-03 ENCOUNTER — Encounter: Payer: Self-pay | Admitting: Family Medicine

## 2014-02-03 ENCOUNTER — Other Ambulatory Visit: Payer: Self-pay | Admitting: Family Medicine

## 2014-02-03 VITALS — BP 139/92 | HR 61 | Temp 98.2°F | Resp 16 | Ht 71.5 in | Wt 383.0 lb

## 2014-02-03 DIAGNOSIS — N529 Male erectile dysfunction, unspecified: Secondary | ICD-10-CM

## 2014-02-03 DIAGNOSIS — I872 Venous insufficiency (chronic) (peripheral): Secondary | ICD-10-CM

## 2014-02-03 DIAGNOSIS — I10 Essential (primary) hypertension: Secondary | ICD-10-CM

## 2014-02-03 MED ORDER — AMLODIPINE BESYLATE 10 MG PO TABS: ORAL_TABLET | ORAL | Status: DC

## 2014-02-03 MED ORDER — HYDROCHLOROTHIAZIDE 25 MG PO TABS: ORAL_TABLET | ORAL | Status: DC

## 2014-02-03 MED ORDER — CLOPIDOGREL BISULFATE 75 MG PO TABS: 75.0000 mg | ORAL_TABLET | Freq: Every day | ORAL | Status: DC

## 2014-02-03 MED ORDER — SILDENAFIL CITRATE 100 MG PO TABS: 50.0000 mg | ORAL_TABLET | Freq: Every day | ORAL | Status: DC | PRN

## 2014-02-03 MED ORDER — CLONIDINE HCL 0.2 MG PO TABS: ORAL_TABLET | ORAL | Status: DC

## 2014-02-03 NOTE — Patient Instructions (Signed)
Keep a record of your blood pressures outside of the office and if running over 140/90 - return to discuss medications.  Diet changes including meals in morning and lunch, smaller portions at night. If you would like to meet with a nutritionist - let me know.  Eucerin, Aveeno, or Lubriderm if needed for dry skin, over the counter hydrocortisone if needed for itching.  You should receive a call or letter about your lab results within the next week to 10 days.

## 2014-02-03 NOTE — Progress Notes (Signed)
Subjective:    Patient ID: Carlos Phillips, male    DOB: Oct 09, 1965, 48 y.o.   MRN: [id number]  HPI Patient presents for hypertension and dyslipidemia. States has not been working on diet at this time. Has been skipping meals and has larger meal at night. Doesn't drink sodas anymore, but is drinking plenty of water. Has been riding stationary bike for 10 min 1-2x/week. Does sometimes skip weeks. Doesn't check BP at home. Denies missed med doses and hasn't had any side effects. Cardiologist increased lipitor and cleared him for use of Viagra according to patient. Neg ROS.   Review of Systems  Constitutional: Negative for fever, activity change and appetite change.  Respiratory: Negative for cough, chest tightness and shortness of breath.   Cardiovascular: Negative for chest pain, palpitations and leg swelling.  Gastrointestinal: Negative for abdominal pain, diarrhea and constipation.  Allergic/Immunologic: Negative for environmental allergies and food allergies.  Neurological: Negative for dizziness, weakness, light-headedness and headaches.  Hematological: Negative for adenopathy.       Objective:   Physical Exam  Constitutional: He is oriented to person, place, and time. He appears well-developed and well-nourished. No distress.  Blood pressure 139/92, pulse 61, temperature 98.2 F (36.8 C), temperature source Oral, resp. rate 16, height 5' 11.5" (1.816 m), weight 383 lb (173.728 kg), SpO2 97 %.   HENT:  Head: Normocephalic and atraumatic.  Right Ear: External ear normal.  Left Ear: External ear normal.  Nose: Nose normal.  Mouth/Throat: Oropharynx is clear and moist. No oropharyngeal exudate.  Eyes: Conjunctivae are normal. Pupils are equal, round, and reactive to light. Right eye exhibits no discharge. Left eye exhibits no discharge. No scleral icterus.  Neck: Normal range of motion. Neck supple. No thyromegaly present.  Cardiovascular: Normal rate, regular rhythm, normal  heart sounds and intact distal pulses.  Exam reveals no gallop and no friction rub.   No murmur heard. Pulmonary/Chest: Effort normal and breath sounds normal. He has no wheezes. He has no rales.  Abdominal: Soft. Bowel sounds are normal. There is no tenderness.  Limited due to body habitus   Musculoskeletal: He exhibits no edema or tenderness.  Lymphadenopathy:    He has no cervical adenopathy.  Neurological: He is alert and oriented to person, place, and time.  Skin: Skin is warm and dry. No rash noted. He is not diaphoretic. No erythema.  Stasis changes on L leg. Hyperpigmentation, excoriation, dry. No scale.    BP Readings from Last 3 Encounters:  02/03/14 139/92  12/20/13 160/90  10/24/13 133/83    Wt Readings from Last 3 Encounters:  02/03/14 383 lb (173.728 kg)  12/20/13 378 lb (171.46 kg)  10/24/13 377 lb (171.006 kg)        Assessment & Plan:  1. Essential hypertension BP borderline.  - cloNIDine (CATAPRES) 0.2 MG tablet; TAKE ONE TABLET BY MOUTH TWICE DAILY  Dispense: 180 tablet; Refill: 1 - hydrochlorothiazide (HYDRODIURIL) 25 MG tablet; TAKE ONE TABLET BY MOUTH EVERY DAY  Dispense: 90 tablet; Refill: 1 - COMPLETE METABOLIC PANEL WITH GFR - Lipid panel  2. Erectile dysfunction, unspecified erectile dysfunction type Cleared by cardiology to take medication. Warning of risk of medication with anginal sx.  - sildenafil (VIAGRA) 100 MG tablet; Take 0.5-1 tablets (50-100 mg total) by mouth daily as needed for erectile dysfunction.  Dispense: 5 tablet; Refill: 5  3. Obesity, morbid, BMI 50 or higher Had extensive discussion about diet and exercise in relation to his BMI and co-morbidities.  4. Stasis changes of left leg Suggested use of Eucerin or Lubriderm. May use OTC cortisone for itching.    Alveta Heimlich PA-C  [facility] 02/03/2014 6:13 PM

## 2014-02-04 LAB — LIPID PANEL
Cholesterol: 195 mg/dL (ref 0–200)
HDL: 54 mg/dL (ref >39)
LDL Cholesterol: 104 mg/dL — ABNORMAL HIGH (ref 0–99)
Total CHOL/HDL Ratio: 3.6 Ratio
Triglycerides: 187 mg/dL — ABNORMAL HIGH (ref <150)
VLDL: 37 mg/dL (ref 0–40)

## 2014-02-04 LAB — COMPLETE METABOLIC PANEL WITH GFR
ALT: 18 U/L (ref 0–53)
AST: 16 U/L (ref 0–37)
Albumin: 4.1 g/dL (ref 3.5–5.2)
Alkaline Phosphatase: 74 U/L (ref 39–117)
BUN: 14 mg/dL (ref 6–23)
CO2: 27 mEq/L (ref 19–32)
Calcium: 10 mg/dL (ref 8.4–10.5)
Chloride: 105 mEq/L (ref 96–112)
Creat: 1.12 mg/dL (ref 0.50–1.35)
GFR, Est African American: 89 mL/min
GFR, Est Non African American: 77 mL/min
Glucose, Bld: 104 mg/dL — ABNORMAL HIGH (ref 70–99)
Potassium: 4.3 mEq/L (ref 3.5–5.3)
Sodium: 141 mEq/L (ref 135–145)
Total Bilirubin: 0.3 mg/dL (ref 0.2–1.2)
Total Protein: 7 g/dL (ref 6.0–8.3)

## 2014-02-06 NOTE — Progress Notes (Signed)
Patient discussed and examined with Ms. Ricki Miller. Agree with assessment and plan of care per her note.

## 2014-03-06 ENCOUNTER — Encounter (HOSPITAL_COMMUNITY): Payer: Self-pay | Admitting: Vascular Surgery

## 2014-04-08 ENCOUNTER — Other Ambulatory Visit: Payer: Self-pay | Admitting: Family Medicine

## 2014-04-11 ENCOUNTER — Telehealth: Payer: Self-pay

## 2014-04-11 MED ORDER — ATORVASTATIN CALCIUM 10 MG PO TABS: ORAL_TABLET | ORAL | Status: DC

## 2014-04-11 NOTE — Telephone Encounter (Signed)
Refilled and pt's wife notified that pt should f/u with Dr. Carlota Raspberry before running out

## 2014-04-11 NOTE — Telephone Encounter (Signed)
Pt would like a refill on atorvastatin (LIPITOR) 10 MG tablet [[id number]. Please advise at [phone]

## 2014-05-01 ENCOUNTER — Ambulatory Visit: Payer: BC Managed Care – PPO | Admitting: Family

## 2014-05-01 ENCOUNTER — Encounter (HOSPITAL_COMMUNITY): Payer: BC Managed Care – PPO

## 2014-05-03 ENCOUNTER — Ambulatory Visit (INDEPENDENT_AMBULATORY_CARE_PROVIDER_SITE_OTHER): Payer: Self-pay | Admitting: Emergency Medicine

## 2014-05-03 VITALS — BP 124/80 | HR 85 | Temp 98.4°F | Resp 19 | Ht 72.0 in | Wt 389.4 lb

## 2014-05-03 DIAGNOSIS — Z021 Encounter for pre-employment examination: Secondary | ICD-10-CM

## 2014-05-03 NOTE — Patient Instructions (Signed)

## 2014-05-03 NOTE — Progress Notes (Signed)
[facility] [address] [place] [address] [phone]  Date:  05/03/2014   Name:  Carlos Phillips   DOB:  21-Mar-1966   MRN:  [id number]  PCP:  Wendie Agreste, MD    Chief Complaint: Annual Exam   History of Present Illness:  Carlos Phillips is a 49 y.o. very pleasant male patient who presents with the following:  DOT   Patient Active Problem List   Diagnosis Date Noted  . Embolism 03/07/2013  . Ischemia of extremity 02/25/2013  . HTN (hypertension) 05/07/2011  . Erectile dysfunction 05/07/2011  . Obesity 05/07/2011    Past Medical History  Diagnosis Date  . Hypertension   . DVT (deep venous thrombosis)     Past Surgical History  Procedure Laterality Date  . Embolectomy Left 02/25/2013    Procedure: Left Popliteal EMBOLECTOMY Poss fasciotomy;  Surgeon: Elam Dutch, MD;  Location: [facility] OR;  Service: Vascular;  Laterality: Left;  Left poplital and Tibial embolectomy with four compartment Fasciotomy with vein patch angioplasty left popliteal artery.  . Abdominal aortagram N/A 02/28/2013    Procedure: ABDOMINAL Maxcine Ham;  Surgeon: Conrad [place], MD;  Location: [facility] CATH LAB;  Service: Cardiovascular;  Laterality: N/A;    History  Substance Use Topics  . Smoking status: Former Smoker -- 1.00 packs/day    Types: Cigarettes  . Smokeless tobacco: Never Used  . Alcohol Use: No    Family History  Problem Relation Age of Onset  . Diabetes Mother   . Heart disease Father   . Asthma Son     Allergies  Allergen Reactions  . Morphine And Related Nausea And Vomiting    Medication list has been reviewed and updated.  Current Outpatient Prescriptions on File Prior to Visit  Medication Sig Dispense Refill  . amLODipine (NORVASC) 10 MG tablet TAKE ONE TABLET BY MOUTH EVERY DAY 90 tablet 1  . atorvastatin (LIPITOR) 10 MG tablet Take one tablet by mouth once daily.   "NEEDS OFFICE VISIT FOR ADDITIONAL REFILLS" 30 tablet 2  . cloNIDine  (CATAPRES) 0.2 MG tablet TAKE ONE TABLET BY MOUTH TWICE DAILY 180 tablet 1  . clopidogrel (PLAVIX) 75 MG tablet Take 1 tablet (75 mg total) by mouth daily with breakfast. 90 tablet 1  . hydrochlorothiazide (HYDRODIURIL) 25 MG tablet TAKE ONE TABLET BY MOUTH EVERY DAY 90 tablet 1  . Multiple Vitamins-Minerals (MULTIVITAMIN WITH MINERALS) tablet Take 1 tablet by mouth daily.    . sildenafil (VIAGRA) 100 MG tablet Take 0.5-1 tablets (50-100 mg total) by mouth daily as needed for erectile dysfunction. 5 tablet 5  . oxyCODONE (ROXICODONE) 5 MG immediate release tablet Take 1-2 tablets (5-10 mg total) by mouth every 6 (six) hours as needed for moderate pain or severe pain. (Patient not taking: Reported on 05/03/2014) 30 tablet 0  . oxyCODONE-acetaminophen (PERCOCET/ROXICET) 5-325 MG per tablet Take 1 tablet by mouth every 4 (four) hours as needed for severe pain. (Patient not taking: Reported on 05/03/2014) 80 tablet 0   Current Facility-Administered Medications on File Prior to Visit  Medication Dose Route Frequency Provider Last Rate Last Dose  . Tdap (BOOSTRIX) injection 0.5 mL  0.5 mL Intramuscular Once Wendie Agreste, MD        Review of Systems:  As per HPI, otherwise negative.    Physical Examination: Filed Vitals:   05/03/14 1141  BP: 124/80  Pulse: 85  Temp: 98.4 F (36.9 C)  Resp: 19  Filed Vitals:   05/03/14 1141  Height: 6' (1.829 m)  Weight: 389 lb 6.4 oz (176.631 kg)   Body mass index is 52.8 kg/(m^2). Ideal Body Weight: Weight in (lb) to have BMI = 25: 183.9  GEN: morbid obesity, NAD, Non-toxic, A & O x 3 HEENT: Atraumatic, Normocephalic. Neck supple. No masses, No LAD. Ears and Nose: No external deformity. CV: RRR, No M/G/R. No JVD. No thrill. No extra heart sounds. PULM: CTA B, no wheezes, crackles, rhonchi. No retractions. No resp. distress. No accessory muscle use. ABD: S, NT, ND, +BS. No rebound. No HSM. EXTR: No c/c/e NEURO Normal gait.  PSYCH: Normally  interactive. Conversant. Not depressed or anxious appearing.  Calm demeanor.    Assessment and Plan: DOT Hypertension Morbid obesity   Signed,  Ellison Carwin, MD

## 2014-05-15 ENCOUNTER — Encounter: Payer: Self-pay | Admitting: Family

## 2014-05-16 ENCOUNTER — Ambulatory Visit (INDEPENDENT_AMBULATORY_CARE_PROVIDER_SITE_OTHER): Payer: BLUE CROSS/BLUE SHIELD | Admitting: Family

## 2014-05-16 ENCOUNTER — Encounter: Payer: Self-pay | Admitting: Family

## 2014-05-16 ENCOUNTER — Ambulatory Visit (HOSPITAL_COMMUNITY)
Admission: RE | Admit: 2014-05-16 | Discharge: 2014-05-16 | Disposition: A | Discharge status: Home / Self Care | Payer: BLUE CROSS/BLUE SHIELD | Source: Ambulatory Visit | Attending: Family | Admitting: Family

## 2014-05-16 VITALS — BP 122/78 | HR 81 | Resp 16 | Ht 72.0 in | Wt 391.0 lb

## 2014-05-16 DIAGNOSIS — I749 Embolism and thrombosis of unspecified artery: Secondary | ICD-10-CM | POA: Insufficient documentation

## 2014-05-16 DIAGNOSIS — I872 Venous insufficiency (chronic) (peripheral): Secondary | ICD-10-CM | POA: Diagnosis not present

## 2014-05-16 DIAGNOSIS — Z87891 Personal history of nicotine dependence: Secondary | ICD-10-CM | POA: Diagnosis not present

## 2014-05-16 DIAGNOSIS — Z9889 Other specified postprocedural states: Secondary | ICD-10-CM

## 2014-05-16 DIAGNOSIS — Z6841 Body Mass Index (BMI) 40.0 and over, adult: Secondary | ICD-10-CM | POA: Diagnosis not present

## 2014-05-16 DIAGNOSIS — I739 Peripheral vascular disease, unspecified: Secondary | ICD-10-CM | POA: Diagnosis not present

## 2014-05-16 DIAGNOSIS — Z95828 Presence of other vascular implants and grafts: Secondary | ICD-10-CM

## 2014-05-16 NOTE — Progress Notes (Signed)
[facility] -PAD  History of Present Illness Carlos Phillips is a 49 y.o. male patient of Dr. Oneida Alar who is status post left popliteal and tibial embolectomy followed by left external iliac artery stenting December 2014. He also had 4 compartment fasciotomy. His fasciotomy has completely healed. He has had intermittent swelling of his left lower extremity. This is improved with elevation.  He denies claudication or rest pain. He has no nonhealing ulcers. He has returned to work full time. He is on Plavix and Lipitor. He has stopped smoking. He has been wearing knee high compression hose sometimes but states they come half way up his calves and dig into his feet after being on his feet at his job.  Pt Diabetic: No Pt smoker: former smoker, quit in 2014  Pt meds include: Statin :Yes Betablocker: No ASA: No Other anticoagulants/antiplatelets: Plavix  Past Medical History  Diagnosis Date  . Hypertension   . DVT (deep venous thrombosis)     Social History History  Substance Use Topics  . Smoking status: Former Smoker -- 1.00 packs/day    Types: Cigarettes    Quit date: 02/25/2013  . Smokeless tobacco: Never Used  . Alcohol Use: No    Family History Family History  Problem Relation Age of Onset  . Diabetes Mother   . Heart disease Father   . Asthma Son     Past Surgical History  Procedure Laterality Date  . Embolectomy Left 02/25/2013    Procedure: Left Popliteal EMBOLECTOMY Poss fasciotomy;  Surgeon: Elam Dutch, MD;  Location: [facility] OR;  Service: Vascular;  Laterality: Left;  Left poplital and Tibial embolectomy with four compartment Fasciotomy with vein patch angioplasty left popliteal artery.  . Abdominal aortagram N/A 02/28/2013    Procedure: ABDOMINAL Maxcine Ham;  Surgeon: Conrad [place], MD;  Location: [facility] C[facility] LAB;  Service: Cardiovascular;  Laterality: N/A;    Allergies  Allergen Reactions  . Morphine And Related  Nausea And Vomiting    Current Outpatient Prescriptions  Medication Sig Dispense Refill  . amLODipine (NORVASC) 10 MG tablet TAKE ONE TABLET BY MOUTH EVERY DAY 90 tablet 1  . atorvastatin (LIPITOR) 10 MG tablet Take one tablet by mouth once daily.   "NEEDS OFFICE VISIT FOR ADDITIONAL REFILLS" 30 tablet 2  . cloNIDine (CATAPRES) 0.2 MG tablet TAKE ONE TABLET BY MOUTH TWICE DAILY 180 tablet 1  . clopidogrel (PLAVIX) 75 MG tablet Take 1 tablet (75 mg total) by mouth daily with breakfast. 90 tablet 1  . hydrochlorothiazide (HYDRODIURIL) 25 MG tablet TAKE ONE TABLET BY MOUTH EVERY DAY 90 tablet 1  . Multiple Vitamins-Minerals (MULTIVITAMIN WITH MINERALS) tablet Take 1 tablet by mouth daily.    . sildenafil (VIAGRA) 100 MG tablet Take 0.5-1 tablets (50-100 mg total) by mouth daily as needed for erectile dysfunction. 5 tablet 5  . oxyCODONE (ROXICODONE) 5 MG immediate release tablet Take 1-2 tablets (5-10 mg total) by mouth every 6 (six) hours as needed for moderate pain or severe pain. (Patient not taking: Reported on 05/03/2014) 30 tablet 0  . oxyCODONE-acetaminophen (PERCOCET/ROXICET) 5-325 MG per tablet Take 1 tablet by mouth every 4 (four) hours as needed for severe pain. (Patient not taking: Reported on 05/03/2014) 80 tablet 0   Current Facility-Administered Medications  Medication Dose Route Frequency Provider Last Rate Last Dose  . Tdap (BOOSTRIX) injection 0.5 mL  0.5 mL Intramuscular Once Wendie Agreste, MD  ROS: See HPI for pertinent positives and negatives.   Physical Examination  Filed Vitals:   05/16/14 1631  BP: 122/78  Pulse: 81  Resp: 16  Height: 6' (1.829 m)  Weight: 391 lb (177.356 kg)  SpO2: 97%   Body mass index is 53.02 kg/(m^2).  General: A&O x 3, WDWN, morbidly obese male. Gait: normal Eyes: Pupils are equal. Pulmonary: Respirations are non labored. Cardiac: regular Rythm.        Aorta is not palpable. Radial pulses: 2+ palpable and =                            VASCULAR EXAM: Extremities without ischemic changes, without Gangrene; without open wounds. 1+ pitting edema right lower leg/foot, 2+ left lower leg/foot.                                                                                                          LE Pulses  Right Left       FEMORAL  not palpable, morbidly obese  not palpable, morbidly obese        POPLITEAL  not palpable   not palpable       POSTERIOR TIBIAL  not palpable   faintly palpable        DORSALIS PEDIS      ANTERIOR TIBIAL faintly palpable  faintly palpable    Abdomen: soft, NT, no palpable masses, large panus. Skin: no rashes, no ulcers. Musculoskeletal: no muscle wasting or atrophy.  Neurologic: A&O X 3; Appropriate Affect ; SENSATION: normal; MOTOR FUNCTION:  moving all extremities equally, motor strength 5/5 throughout. Speech is fluent/normal.  CN 2-12 intact.   Non-Invasive Vascular Imaging: DATE: 05/16/2014 ABI: RIGHT 1.04 (10/24/13, 0.83), Waveforms: bi and triphasic, TBI: 0.85;  LEFT 1.06 (0.65), Waveforms: triphasic, TBI: 0.74   ASSESSMENT: Carlos Phillips is a 50 y.o. male  who is status post left popliteal and tibial embolectomy followed by left external iliac artery stenting December 2014. He also had 4 compartment fasciotomy. His fasciotomy has completely healed. He has had intermittent swelling of his left lower extremity. This is improved with elevation.  He denies claudication or rest pain. He has no nonhealing ulcers. He has returned to work full time. He is on Plavix and Lipitor. He has stopped smoking. He has been wearing knee high compression hose sometimes but states they come half way up his calves and dig into his feet after being on his feet at his job.  -PAD- ABI's today have improved to normal compared to six months ago. Right PT waveform is biphasic, the remainder of waveforms are triphasic.  -Venous insufficiency- His knee high compression hose are too short and cut into  the anterior aspects of his ankle after he is on his feet for a while. He was instructed in measuring of his lower legs for proper fit of knee high graduated compression hose, see Plan.   PLAN:  I discussed in depth with the patient the nature of atherosclerosis, and emphasized the  importance of maximal medical management including strict control of blood pressure, blood glucose, and lipid levels, obtaining regular exercise, and continued cessation of smoking.  The patient is aware that without maximal medical management the underlying atherosclerotic disease process will progress, limiting the benefit of any interventions.  Based on the patient's vascular studies and examination, pt will return to clinic in 6  months with ABI's.   The patient was given information about PAD including signs, symptoms, treatment, what symptoms should prompt the patient to seek immediate medical care, and risk reduction measures to take.  Venous insufficiency- pt was instructed how and when to measure his lower legs to fit for graduated knee high compression hose, 20-30 mm Hg, donn in the morning, remove at bedtime. He was given printed information about ETI compression hose outlet in [place], RN, MSN, FNP-C [facility] Phone: [phone]  Clinic MD: Bridgett Larsson  05/16/2014 4:47 PM

## 2014-05-16 NOTE — Patient Instructions (Addendum)
Peripheral Vascular Disease Peripheral Vascular Disease (PVD), also called Peripheral Arterial Disease (PAD), is a circulation problem caused by cholesterol (atherosclerotic plaque) deposits in the arteries. PVD commonly occurs in the lower extremities (legs) but it can occur in other areas of the body, such as your arms. The cholesterol buildup in the arteries reduces blood flow which can cause pain and other serious problems. The presence of PVD can place a person at risk for Coronary Artery Disease (CAD).  CAUSES  Causes of PVD can be many. It is usually associated with more than one risk factor such as:   High Cholesterol.  Smoking.  Diabetes.  Lack of exercise or inactivity.  High blood pressure (hypertension).  Obesity.  Family history. SYMPTOMS   When the lower extremities are affected, patients with PVD may experience:  Leg pain with exertion or physical activity. This is called INTERMITTENT CLAUDICATION. This may present as cramping or numbness with physical activity. The location of the pain is associated with the level of blockage. For example, blockage at the abdominal level (distal abdominal aorta) may result in buttock or hip pain. Lower leg arterial blockage may result in calf pain.  As PVD becomes more severe, pain can develop with less physical activity.  In people with severe PVD, leg pain may occur at rest.  Other PVD signs and symptoms:  Leg numbness or weakness.  Coldness in the affected leg or foot, especially when compared to the other leg.  A change in leg color.  Patients with significant PVD are more prone to ulcers or sores on toes, feet or legs. These may take longer to heal or may reoccur. The ulcers or sores can become infected.  If signs and symptoms of PVD are ignored, gangrene may occur. This can result in the loss of toes or loss of an entire limb.  Not all leg pain is related to PVD. Other medical conditions can cause leg pain such  as:  Blood clots (embolism) or Deep Vein Thrombosis.  Inflammation of the blood vessels (vasculitis).  Spinal stenosis. DIAGNOSIS  Diagnosis of PVD can involve several different types of tests. These can include:  Pulse Volume Recording Method (PVR). This test is simple, painless and does not involve the use of X-rays. PVR involves measuring and comparing the blood pressure in the arms and legs. An ABI (Ankle-Brachial Index) is calculated. The normal ratio of blood pressures is 1. As this number becomes smaller, it indicates more severe disease.  < 0.95 - indicates significant narrowing in one or more leg vessels.  <0.8 - there will usually be pain in the foot, leg or buttock with exercise.  <0.4 - will usually have pain in the legs at rest.  <0.25 - usually indicates limb threatening PVD.  Doppler detection of pulses in the legs. This test is painless and checks to see if you have a pulses in your legs/feet.  A dye or contrast material (a substance that highlights the blood vessels so they show up on x-ray) may be given to help your caregiver better see the arteries for the following tests. The dye is eliminated from your body by the kidney's. Your caregiver may order blood work to check your kidney function and other laboratory values before the following tests are performed:  Magnetic Resonance Angiography (MRA). An MRA is a picture study of the blood vessels and arteries. The MRA machine uses a large magnet to produce images of the blood vessels.  Computed Tomography Angiography (CTA). A CTA   is a specialized x-ray that looks at how the blood flows in your blood vessels. An IV may be inserted into your arm so contrast dye can be injected.  Angiogram. Is a procedure that uses x-rays to look at your blood vessels. This procedure is minimally invasive, meaning a small incision (cut) is made in your groin. A small tube (catheter) is then inserted into the artery of your groin. The catheter  is guided to the blood vessel or artery your caregiver wants to examine. Contrast dye is injected into the catheter. X-rays are then taken of the blood vessel or artery. After the images are obtained, the catheter is taken out. TREATMENT  Treatment of PVD involves many interventions which may include:  Lifestyle changes:  Quitting smoking.  Exercise.  Following a low fat, low cholesterol diet.  Control of diabetes.  Foot care is very important to the PVD patient. Good foot care can help prevent infection.  Medication:  Cholesterol-lowering medicine.  Blood pressure medicine.  Anti-platelet drugs.  Certain medicines may reduce symptoms of Intermittent Claudication.  Interventional/Surgical options:  Angioplasty. An Angioplasty is a procedure that inflates a balloon in the blocked artery. This opens the blocked artery to improve blood flow.  Stent Implant. A wire mesh tube (stent) is placed in the artery. The stent expands and stays in place, allowing the artery to remain open.  Peripheral Bypass Surgery. This is a surgical procedure that reroutes the blood around a blocked artery to help improve blood flow. This type of procedure may be performed if Angioplasty or stent implants are not an option. SEEK IMMEDIATE MEDICAL CARE IF:   You develop pain or numbness in your arms or legs.  Your arm or leg turns cold, becomes blue in color.  You develop redness, warmth, swelling and pain in your arms or legs. MAKE SURE YOU:   Understand these instructions.  Will watch your condition.  Will get help right away if you are not doing well or get worse. Document Released: 04/21/2004 Document Revised: 06/06/2011 Document Reviewed: 03/18/2008 ExitCare Patient Information 2015 [employer]. This information is not intended to replace advice given to you by your health care provider. Make sure you discuss any questions you have with your health care provider.   Venous Stasis or  Chronic Venous Insufficiency Chronic venous insufficiency, also called venous stasis, is a condition that affects the veins in the legs. The condition prevents blood from being pumped through these veins effectively. Blood may no longer be pumped effectively from the legs back to the heart. This condition can range from mild to severe. With proper treatment, you should be able to continue with an active life. CAUSES  Chronic venous insufficiency occurs when the vein walls become stretched, weakened, or damaged or when valves within the vein are damaged. Some common causes of this include:  High blood pressure inside the veins (venous hypertension).  Increased blood pressure in the leg veins from long periods of sitting or standing.  A blood clot that blocks blood flow in a vein (deep vein thrombosis).  Inflammation of a superficial vein (phlebitis) that causes a blood clot to form. RISK FACTORS Various things can make you more likely to develop chronic venous insufficiency, including:  Family history of this condition.  Obesity.  Pregnancy.  Sedentary lifestyle.  Smoking.  Jobs requiring long periods of standing or sitting in one place.  Being a certain age. Women in their 40s and 50s and men in their 70s are more   likely to develop this condition. SIGNS AND SYMPTOMS  Symptoms may include:   Varicose veins.  Skin breakdown or ulcers.  Reddened or discolored skin on the leg.  Brown, smooth, tight, and painful skin just above the ankle, usually on the inside surface (lipodermatosclerosis).  Swelling. DIAGNOSIS  To diagnose this condition, your health care provider will take a medical history and do a physical exam. The following tests may be ordered to confirm the diagnosis:  Duplex ultrasound--A procedure that produces a picture of a blood vessel and nearby organs and also provides information on blood flow through the blood vessel.  Plethysmography--A procedure that tests  blood flow.  A venogram, or venography--A procedure used to look at the veins using X-ray and dye. TREATMENT The goals of treatment are to help you return to an active life and to minimize pain or disability. Treatment will depend on the severity of the condition. Medical procedures may be needed for severe cases. Treatment options may include:   Use of compression stockings. These can help with symptoms and lower the chances of the problem getting worse, but they do not cure the problem.  Sclerotherapy--A procedure involving an injection of a material that "dissolves" the damaged veins. Other veins in the network of blood vessels take over the function of the damaged veins.  Surgery to remove the vein or cut off blood flow through the vein (vein stripping or laser ablation surgery).  Surgery to repair a valve. HOME CARE INSTRUCTIONS   Wear compression stockings as directed by your health care provider.  Only take over-the-counter or prescription medicines for pain, discomfort, or fever as directed by your health care provider.  Follow up with your health care provider as directed. SEEK MEDICAL CARE IF:   You have redness, swelling, or increasing pain in the affected area.  You see a red streak or line that extends up or down from the affected area.  You have a breakdown or loss of skin in the affected area, even if the breakdown is small.  You have an injury to the affected area. SEEK IMMEDIATE MEDICAL CARE IF:   You have an injury and open wound in the affected area.  Your pain is severe and does not improve with medicine.  You have sudden numbness or weakness in the foot or ankle below the affected area, or you have trouble moving your foot or ankle.  You have a fever or persistent symptoms for more than 2-3 days.  You have a fever and your symptoms suddenly get worse. MAKE SURE YOU:   Understand these instructions.  Will watch your condition.  Will get help right  away if you are not doing well or get worse. Document Released: 07/18/2006 Document Revised: 01/02/2013 Document Reviewed: 11/19/2012 ExitCare Patient Information 2015 [employer]. This information is not intended to replace advice given to you by your health care provider. Make sure you discuss any questions you have with your health care provider.  

## 2014-06-06 ENCOUNTER — Other Ambulatory Visit: Payer: Self-pay | Admitting: Family Medicine

## 2014-06-15 ENCOUNTER — Ambulatory Visit (INDEPENDENT_AMBULATORY_CARE_PROVIDER_SITE_OTHER): Payer: BLUE CROSS/BLUE SHIELD | Admitting: Family Medicine

## 2014-06-15 ENCOUNTER — Other Ambulatory Visit: Payer: Self-pay | Admitting: Family Medicine

## 2014-06-15 VITALS — BP 132/80 | HR 96 | Temp 98.2°F | Resp 17 | Ht 72.5 in | Wt 389.0 lb

## 2014-06-15 DIAGNOSIS — E785 Hyperlipidemia, unspecified: Secondary | ICD-10-CM | POA: Diagnosis not present

## 2014-06-15 DIAGNOSIS — I10 Essential (primary) hypertension: Secondary | ICD-10-CM

## 2014-06-15 DIAGNOSIS — Z1211 Encounter for screening for malignant neoplasm of colon: Secondary | ICD-10-CM | POA: Diagnosis not present

## 2014-06-15 DIAGNOSIS — R21 Rash and other nonspecific skin eruption: Secondary | ICD-10-CM

## 2014-06-15 DIAGNOSIS — L282 Other prurigo: Secondary | ICD-10-CM

## 2014-06-15 DIAGNOSIS — Z2821 Immunization not carried out because of patient refusal: Secondary | ICD-10-CM | POA: Diagnosis not present

## 2014-06-15 DIAGNOSIS — Z125 Encounter for screening for malignant neoplasm of prostate: Secondary | ICD-10-CM | POA: Diagnosis not present

## 2014-06-15 DIAGNOSIS — Z1329 Encounter for screening for other suspected endocrine disorder: Secondary | ICD-10-CM

## 2014-06-15 DIAGNOSIS — I739 Peripheral vascular disease, unspecified: Secondary | ICD-10-CM

## 2014-06-15 DIAGNOSIS — Z0000 Encounter for general adult medical examination without abnormal findings: Secondary | ICD-10-CM

## 2014-06-15 LAB — POCT CBC
Granulocyte percent: 65.4 %G (ref 37–80)
HCT, POC: 45.6 % (ref 43.5–53.7)
Hemoglobin: 14.8 g/dL (ref 14.1–18.1)
Lymph, poc: 2.5 (ref 0.6–3.4)
MCH, POC: 28.4 pg (ref 27–31.2)
MCHC: 32.4 g/dL (ref 31.8–35.4)
MCV: 87.5 fL (ref 80–97)
MID (cbc): 0.4 (ref 0–0.9)
MPV: 7.4 fL (ref 0–99.8)
POC Granulocyte: 5.4 (ref 2–6.9)
POC LYMPH PERCENT: 29.9 %L (ref 10–50)
POC MID %: 4.7 %M (ref 0–12)
Platelet Count, POC: 273 10*3/uL (ref 142–424)
RBC: 5.21 M/uL (ref 4.69–6.13)
RDW, POC: 15.1 %
WBC: 8.3 10*3/uL (ref 4.6–10.2)

## 2014-06-15 LAB — LIPID PANEL
Cholesterol: 154 mg/dL (ref 0–200)
HDL: 49 mg/dL (ref >=40)
LDL Cholesterol: 79 mg/dL (ref 0–99)
Total CHOL/HDL Ratio: 3.1 Ratio
Triglycerides: 129 mg/dL (ref <150)
VLDL: 26 mg/dL (ref 0–40)

## 2014-06-15 LAB — TSH: TSH: 0.533 u[IU]/mL (ref 0.350–4.500)

## 2014-06-15 LAB — POCT GLYCOSYLATED HEMOGLOBIN (HGB A1C): Hemoglobin A1C: 6.2

## 2014-06-15 MED ORDER — CLOBETASOL PROPIONATE 0.05 % EX FOAM: Freq: Two times a day (BID) | CUTANEOUS | Status: DC

## 2014-06-15 NOTE — Progress Notes (Signed)
Chief Complaint:  Chief Complaint  Patient presents with  . Employment Physical    HPI: Carlos Phillips is a 49 y.o. male who is here for  Annual PE He is doing well except for rash on his back  Last annual was  "a while ago" He has HTN, history of DVT in left leg s/p stent followed by vascular, Hyperlipidemia He has a rash on his back, chest, legs, arms for about 2 weeks. It initially started on his right leg. In a patchy area. And then overnight he did all over. He travels. He denies any recent hotel stays. . He  has used some cortizone 10 with some releif, he has changed out his soap several times, he does have dry skin and he also uses bar soap and he feels dried out when he uses it, he has tried vasoline with coco butter. The rash started on his right calf and then just blossomed fairly quickly to the rest of his body. He had a cold at that time as well. It itches a lot. All over his body on his back on his legs on the front and eyes any history of eczema He is a local truck driver He is UTD on his Tdap Declines any flu vaccine, STD testing. He is taking his medications without any side effects.   Past Medical History  Diagnosis Date  . Hypertension   . DVT (deep venous thrombosis)   . Hyperlipidemia    Past Surgical History  Procedure Laterality Date  . Embolectomy Left 02/25/2013    Procedure: Left Popliteal EMBOLECTOMY Poss fasciotomy;  Surgeon: Elam Dutch, MD;  Location: [facility] OR;  Service: Vascular;  Laterality: Left;  Left poplital and Tibial embolectomy with four compartment Fasciotomy with vein patch angioplasty left popliteal artery.  . Abdominal aortagram N/A 02/28/2013    Procedure: ABDOMINAL Maxcine Ham;  Surgeon: Conrad Rifle, MD;  Location: [facility] CATH LAB;  Service: Cardiovascular;  Laterality: N/A;   History   Social History  . Marital Status: Married    Spouse Name: N/A  . Number of Children: N/A  . Years of Education: N/A   Social History Main  Topics  . Smoking status: Former Smoker -- 1.00 packs/day    Types: Cigarettes    Quit date: 02/25/2013  . Smokeless tobacco: Never Used  . Alcohol Use: No  . Drug Use: No  . Sexual Activity: Yes   Other Topics Concern  . None   Social History Narrative   Family History  Problem Relation Age of Onset  . Diabetes Mother   . Heart disease Father   . Asthma Son    Allergies  Allergen Reactions  . Morphine And Related Nausea And Vomiting   Prior to Admission medications   Medication Sig Start Date End Date Taking? Authorizing Provider  amLODipine (NORVASC) 10 MG tablet TAKE ONE TABLET BY MOUTH EVERY DAY 02/03/14  Yes Wendie Agreste, MD  atorvastatin (LIPITOR) 10 MG tablet Take one tablet by mouth once daily.   "NEEDS OFFICE VISIT FOR ADDITIONAL REFILLS" 04/11/14  Yes Wendie Agreste, MD  cloNIDine (CATAPRES) 0.2 MG tablet TAKE ONE TABLET BY MOUTH TWICE DAILY 02/03/14  Yes Wendie Agreste, MD  clopidogrel (PLAVIX) 75 MG tablet Take 1 tablet (75 mg total) by mouth daily with breakfast. 02/03/14  Yes Wendie Agreste, MD  hydrochlorothiazide (HYDRODIURIL) 25 MG tablet TAKE ONE TABLET BY MOUTH EVERY DAY 02/03/14  Yes Wendie Agreste,  MD  Multiple Vitamins-Minerals (MULTIVITAMIN WITH MINERALS) tablet Take 1 tablet by mouth daily.   Yes Historical Provider, MD  sildenafil (VIAGRA) 100 MG tablet Take 0.5-1 tablets (50-100 mg total) by mouth daily as needed for erectile dysfunction. 02/03/14 02/02/15 Yes Wendie Agreste, MD     ROS: The patient denies fevers, chills, night sweats, unintentional weight loss, chest pain, palpitations, wheezing, dyspnea on exertion, nausea, vomiting, abdominal pain, dysuria, hematuria, melena, numbness, weakness, or tingling.   All other systems have been reviewed and were otherwise negative with the exception of those mentioned in the HPI and as above.    PHYSICAL EXAM: Filed Vitals:   06/15/14 0849  BP: 132/80  Pulse: 96  Temp: 98.2 F (36.8 C)    Resp: 17   Filed Vitals:   06/15/14 0849  Height: 6' 0.5" (1.842 m)  Weight: 389 lb (176.449 kg)   Body mass index is 52 kg/(m^2).  General: Alert, no acute distress, obese African-American male HEENT:  Normocephalic, atraumatic, oropharynx patent. EOMI, PERRLA Cardiovascular:  Regular rate and rhythm, no rubs murmurs or gallops.  No Carotid bruits, radial pulse intact. No pedal edema.  Respiratory: Clear to auscultation bilaterally.  No wheezes, rales, or rhonchi.  No cyanosis, no use of accessory musculature GI: No organomegaly, abdomen is soft and non-tender, positive bowel sounds.  No masses. Skin: Positive maculopapular eczematous rashes diffusely scattered on his chest back arms and legs.. Neurologic: Facial musculature symmetric. Psychiatric: Patient is appropriate throughout our interaction. Lymphatic: No cervical lymphadenopathy Musculoskeletal: Gait intact. He has a pannus without any fungal infection present. Testicular exam was normal. Inguinal hernia exam was negative.   LABS: Results for orders placed or performed in visit on 06/15/14  POCT glycosylated hemoglobin (Hb A1C)  Result Value Ref Range   Hemoglobin A1C 6.2   POCT CBC  Result Value Ref Range   WBC 8.3 4.6 - 10.2 K/uL   Lymph, poc 2.5 0.6 - 3.4   POC LYMPH PERCENT 29.9 10 - 50 %L   MID (cbc) 0.4 0 - 0.9   POC MID % 4.7 0 - 12 %M   POC Granulocyte 5.4 2 - 6.9   Granulocyte percent 65.4 37 - 80 %G   RBC 5.21 4.69 - 6.13 M/uL   Hemoglobin 14.8 14.1 - 18.1 g/dL   HCT, POC 45.6 43.5 - 53.7 %   MCV 87.5 80 - 97 fL   MCH, POC 28.4 27 - 31.2 pg   MCHC 32.4 31.8 - 35.4 g/dL   RDW, POC 15.1 %   Platelet Count, POC 273 142 - 424 K/uL   MPV 7.4 0 - 99.8 fL     EKG/XRAY:   Primary read interpreted by [name] at [facility].   ASSESSMENT/PLAN: Encounter Diagnoses  Name Primary?  . Annual physical exam Yes  . Hyperlipidemia   . Essential hypertension   . PVD (peripheral vascular disease)   . Obesity,  morbid, BMI 50 or higher   . Screening for prostate cancer   . Special screening for malignant neoplasms, colon   . Screening for thyroid disorder   . Influenza vaccination declined    Carlos Phillips is a pleasant 49 year old obese African-American male with past medical history of hypertension, hyperlipidemia, glucose intolerance, left leg DVT status post embolectomy who is here for an annual physical. He is doing well except for a recent acute rash after a viral illness, this sounds and looks very similar to pityriasis rosea Annual labs pending  He declines any vaccinations at this time. He is up-to-date on his tetanus. He will be prescribed clobetasol foam for his itchy rash. If this is not approved by his insurance or is too expensive then we can prescribe him clobetasol cream. If he does not get any improvement with the clobetasol cream then he needs to let me know so we can refer him to dermatology. I discussed it with him diet and exercise changes for his glucose intolerance. He states that he will try to exercise more so now that he is feeling a little bit better healthwise. If he comes back in 3-6 months and gets his sugars rechecked and it is still high we may consider putting him on metformin for hyperglycemia and also possible weight loss Follow-up in 3-6 months.   Gross sideeffects, risk and benefits, and alternatives of medications d/w patient. Patient is aware that all medications have potential sideeffects and we are unable to predict every sideeffect or drug-drug interaction that may occur.  LE, [place], DO 06/15/2014 10:14 AM

## 2014-06-15 NOTE — Patient Instructions (Signed)
Pityriasis Rosea  Pityriasis rosea is a rash which is probably caused by a virus. It generally starts as a scaly, red patch on the trunk (the area of the body that a t-shirt would cover) but does not appear on sun exposed areas. The rash is usually preceded by an initial larger spot called the "herald patch" a week or more before the rest of the rash appears. Generally within one to two days the rash appears rapidly on the trunk, upper arms, and sometimes the upper legs. The rash usually appears as flat, oval patches of scaly pink color. The rash can also be raised and one is able to feel it with a finger. The rash can also be finely crinkled and may slough off leaving a ring of scale around the spot. Sometimes a mild sore throat is present with the rash. It usually affects children and young adults in the spring and autumn. Women are more frequently affected than men.  TREATMENT   Pityriasis rosea is a self-limited condition. This means it goes away within 4 to 8 weeks without treatment. The spots may persist for several months, especially in darker-colored skin after the rash has resolved and healed. Benadryl and steroid creams may be used if itching is a problem.  SEEK MEDICAL CARE IF:   · Your rash does not go away or persists longer than three months.  · You develop fever and joint pain.  · You develop severe headache and confusion.  · You develop breathing difficulty, vomiting and/or extreme weakness.  Document Released: 04/20/2001 Document Revised: 06/06/2011 Document Reviewed: 05/09/2008  ExitCare® Patient Information ©2015 [employer]. This information is not intended to replace advice given to you by your health care provider. Make sure you discuss any questions you have with your health care provider.

## 2014-06-16 LAB — PSA: PSA: 0.31 ng/mL (ref <=4.00)

## 2014-06-19 ENCOUNTER — Other Ambulatory Visit: Payer: Self-pay | Admitting: Family Medicine

## 2014-06-20 ENCOUNTER — Encounter: Payer: Self-pay | Admitting: Family Medicine

## 2014-06-20 LAB — COMPLETE METABOLIC PANEL WITH GFR
ALT: 19 U/L (ref 0–53)
AST: 16 U/L (ref 0–37)
Albumin: 3.8 g/dL (ref 3.5–5.2)
Alkaline Phosphatase: 61 U/L (ref 39–117)
BUN: 14 mg/dL (ref 6–23)
CO2: 24 mEq/L (ref 19–32)
Calcium: 9.2 mg/dL (ref 8.4–10.5)
Chloride: 105 mEq/L (ref 96–112)
Creat: 1.06 mg/dL (ref 0.50–1.35)
GFR, Est African American: >89 mL/min
GFR, Est Non African American: 82 mL/min
Glucose, Bld: 106 mg/dL — ABNORMAL HIGH (ref 70–99)
Potassium: 4.2 mEq/L (ref 3.5–5.3)
Sodium: 139 mEq/L (ref 135–145)
Total Bilirubin: 0.4 mg/dL (ref 0.2–1.2)
Total Protein: 6.2 g/dL (ref 6.0–8.3)

## 2014-06-20 NOTE — Telephone Encounter (Signed)
Dr Marin Comment, could pt already be out of this that you Rxd on 3/20? I pended the larger size w/RFs for your review.

## 2014-09-17 ENCOUNTER — Other Ambulatory Visit: Payer: Self-pay | Admitting: Family Medicine

## 2014-09-23 ENCOUNTER — Other Ambulatory Visit: Payer: Self-pay | Admitting: Family Medicine

## 2014-11-06 ENCOUNTER — Other Ambulatory Visit: Payer: Self-pay | Admitting: *Deleted

## 2014-11-06 DIAGNOSIS — I739 Peripheral vascular disease, unspecified: Secondary | ICD-10-CM

## 2014-11-12 ENCOUNTER — Encounter: Payer: Self-pay | Admitting: Family

## 2014-11-13 ENCOUNTER — Ambulatory Visit: Payer: BLUE CROSS/BLUE SHIELD | Admitting: Family

## 2014-11-13 ENCOUNTER — Other Ambulatory Visit: Payer: Self-pay | Admitting: Vascular Surgery

## 2014-11-13 ENCOUNTER — Encounter (HOSPITAL_COMMUNITY): Payer: BLUE CROSS/BLUE SHIELD

## 2014-11-13 DIAGNOSIS — Z48812 Encounter for surgical aftercare following surgery on the circulatory system: Secondary | ICD-10-CM

## 2014-11-13 DIAGNOSIS — I739 Peripheral vascular disease, unspecified: Secondary | ICD-10-CM

## 2014-12-10 ENCOUNTER — Encounter: Payer: Self-pay | Admitting: Family

## 2014-12-12 ENCOUNTER — Ambulatory Visit (INDEPENDENT_AMBULATORY_CARE_PROVIDER_SITE_OTHER): Payer: 59 | Admitting: Family

## 2014-12-12 ENCOUNTER — Ambulatory Visit (HOSPITAL_COMMUNITY)
Admission: RE | Admit: 2014-12-12 | Discharge: 2014-12-12 | Disposition: A | Discharge status: Home / Self Care | Payer: 59 | Source: Ambulatory Visit | Attending: Family | Admitting: Family

## 2014-12-12 ENCOUNTER — Encounter: Payer: Self-pay | Admitting: Family

## 2014-12-12 VITALS — BP 111/73 | HR 75 | Temp 98.9°F | Resp 16 | Ht 72.0 in | Wt 388.0 lb

## 2014-12-12 DIAGNOSIS — Z4889 Encounter for other specified surgical aftercare: Secondary | ICD-10-CM

## 2014-12-12 DIAGNOSIS — I739 Peripheral vascular disease, unspecified: Secondary | ICD-10-CM | POA: Insufficient documentation

## 2014-12-12 DIAGNOSIS — Z95828 Presence of other vascular implants and grafts: Secondary | ICD-10-CM

## 2014-12-12 DIAGNOSIS — Z9889 Other specified postprocedural states: Secondary | ICD-10-CM

## 2014-12-12 DIAGNOSIS — Z48812 Encounter for surgical aftercare following surgery on the circulatory system: Secondary | ICD-10-CM

## 2014-12-12 DIAGNOSIS — Z87891 Personal history of nicotine dependence: Secondary | ICD-10-CM

## 2014-12-12 DIAGNOSIS — I872 Venous insufficiency (chronic) (peripheral): Secondary | ICD-10-CM

## 2014-12-12 NOTE — Patient Instructions (Signed)

## 2014-12-12 NOTE — Progress Notes (Signed)
[facility] [place] HISTORY AND PHYSICAL -PAD  History of Present Illness Carlos Phillips is a 49 y.o. male patient of Dr. Oneida Alar who is status post left popliteal and tibial embolectomy followed by left external iliac artery stenting December 2014. He also had 4 compartment fasciotomy. His fasciotomy has completely healed. He has had intermittent swelling of his left lower extremity. This is improved with elevation. He denies claudication or rest pain. He has no nonhealing ulcers. He has returned to work full time. He is on Plavix and Lipitor. He has stopped smoking. He has been wearing knee high compression hose sometimes but states they come half way up his calves and dig into his feet after being on his feet at his job.  Pt Diabetic: No Pt smoker: former smoker, quit in 2014  Pt meds include: Statin :Yes Betablocker: No ASA: No Other anticoagulants/antiplatelets: Plavix     Past Medical History  Diagnosis Date  . Hypertension   . DVT (deep venous thrombosis)   . Hyperlipidemia     Social History Social History  Substance Use Topics  . Smoking status: Former Smoker -- 1.00 packs/day    Types: Cigarettes    Quit date: 02/25/2013  . Smokeless tobacco: Never Used  . Alcohol Use: No    Family History Family History  Problem Relation Age of Onset  . Diabetes Mother   . Heart disease Father   . Asthma Son     Past Surgical History  Procedure Laterality Date  . Embolectomy Left 02/25/2013    Procedure: Left Popliteal EMBOLECTOMY Poss fasciotomy;  Surgeon: Elam Dutch, MD;  Location: [facility] OR;  Service: Vascular;  Laterality: Left;  Left poplital and Tibial embolectomy with four compartment Fasciotomy with vein patch angioplasty left popliteal artery.  . Abdominal aortagram N/A 02/28/2013    Procedure: ABDOMINAL Maxcine Ham;  Surgeon: Conrad [place], MD;  Location: [facility] CATH LAB;  Service: Cardiovascular;  Laterality: N/A;    Allergies  Allergen  Reactions  . Morphine And Related Nausea And Vomiting    Current Outpatient Prescriptions  Medication Sig Dispense Refill  . amLODipine (NORVASC) 10 MG tablet TAKE ONE TABLET BY MOUTH ONCE DAILY "OV NEEDED FOR REFILLS" 90 tablet 0  . atorvastatin (LIPITOR) 10 MG tablet Take one tablet by mouth once daily.   "NEEDS OFFICE VISIT FOR ADDITIONAL REFILLS" 30 tablet 2  . clobetasol (OLUX) 0.05 % topical foam APPLY TOPICALLY 2 TIMES DAILY 100 g 5  . cloNIDine (CATAPRES) 0.2 MG tablet TAKE ONE TABLET BY MOUTH TWICE DAILY 180 tablet 1  . clopidogrel (PLAVIX) 75 MG tablet Take 1 tablet (75 mg total) by mouth daily with breakfast. 90 tablet 1  . hydrochlorothiazide (HYDRODIURIL) 25 MG tablet TAKE ONE TABLET BY MOUTH ONCE DAILY. "OV NEEDED FOR REFILLS" 90 tablet 0  . Multiple Vitamins-Minerals (MULTIVITAMIN WITH MINERALS) tablet Take 1 tablet by mouth daily.    . sildenafil (VIAGRA) 100 MG tablet Take 0.5-1 tablets (50-100 mg total) by mouth daily as needed for erectile dysfunction. 5 tablet 5   Current Facility-Administered Medications  Medication Dose Route Frequency Audria Takeshita Last Rate Last Dose  . Tdap (BOOSTRIX) injection 0.5 mL  0.5 mL Intramuscular Once Wendie Agreste, MD        ROS: See HPI for pertinent positives and negatives.   Physical Examination  Filed Vitals:   12/12/14 1507  BP: 111/73  Pulse: 75  Temp: 98.9 F (37.2 C)  Resp: 16  Height: 6' (1.829  m)  Weight: 388 lb (175.996 kg)  SpO2: 98%   Body mass index is 52.61 kg/(m^2).  General: A&O x 3, WDWN, morbidly obese male. Gait: normal Eyes: Pupils are equal. Pulmonary: Respirations are non labored. Cardiac: regular Rythm.    Aorta is not palpable. Radial pulses: 2+ palpable and =   VASCULAR EXAM: Extremities without ischemic changes, without Gangrene; without open wounds. 1+ pitting edema right lower leg/foot, 2+ left lower leg/foot.      LE Pulses  Right Left   FEMORAL not palpable, morbidly obese not palpable, morbidly obese    POPLITEAL not palpable  not palpable   POSTERIOR TIBIAL not palpable  faintly palpable    DORSALIS PEDIS  ANTERIOR TIBIAL 2+ palpable  faintly palpable    Abdomen: soft, NT, no palpable masses, large panus. Skin: no rashes, no ulcers. Musculoskeletal: no muscle wasting or atrophy. Neurologic: A&O X 3; Appropriate Affect; MOTOR FUNCTION: moving all extremities equally, motor strength 5/5 throughout. Speech is fluent/normal.  CN 2-12 intact.           Non-Invasive Vascular Imaging: DATE: 12/12/2014 ABI (Date: 12/12/2014)  R: 1.07 (1.04, 05/16/14), DP: triphasic, PT: triphasic, TBI: 0.94  L: 1.03 (1.06), DP: triphasic, PT: triphasic, TBI: 0.94  ASSESSMENT: Carlos Phillips is a 49 y.o. male  who is status post left popliteal and tibial embolectomy followed by left external iliac artery stenting December 2014. He also had 4 compartment fasciotomy. His fasciotomy has completely healed. He has had intermittent swelling of his left lower extremity. This is improved with elevation. He denies claudication or rest pain. He has no nonhealing ulcers. He has returned to work full time. He is on Plavix and Lipitor. He has stopped smoking. He has been wearing knee high compression hose sometimes but states they come half way up his calves and dig into his feet after being on his feet at his job.  ABI's remain normal with all triphasic waveforms.   PLAN:  Based on the patient's vascular studies and examination, pt will return to clinic in 1 year with ABI's; his abdominal panus is too large to attempt an iliac stent duplex.   I discussed in depth with the patient the nature of atherosclerosis, and emphasized the importance of maximal medical  management including strict control of blood pressure, blood glucose, and lipid levels, obtaining regular exercise, and continued cessation of smoking.  The patient is aware that without maximal medical management the underlying atherosclerotic disease process will progress, limiting the benefit of any interventions.  The patient was given information about PAD including signs, symptoms, treatment, what symptoms should prompt the patient to seek immediate medical care, and risk reduction measures to take.  Clemon Chambers, RN, MSN, FNP-C [facility] [employer] [facility] Phone: [phone]  Clinic MD: Bridgett Larsson  12/12/2014 3:38 PM

## 2014-12-15 NOTE — Addendum Note (Signed)
Addended by: Dorthula Rue L on: 12/15/2014 09:39 AM   Modules accepted: Orders

## 2015-02-27 ENCOUNTER — Ambulatory Visit (INDEPENDENT_AMBULATORY_CARE_PROVIDER_SITE_OTHER): Payer: 59 | Admitting: Internal Medicine

## 2015-02-27 ENCOUNTER — Encounter: Payer: Self-pay | Admitting: Internal Medicine

## 2015-02-27 VITALS — BP 134/78 | HR 76 | Ht 72.0 in | Wt 398.0 lb

## 2015-02-27 DIAGNOSIS — I749 Embolism and thrombosis of unspecified artery: Secondary | ICD-10-CM

## 2015-02-27 MED ORDER — ATORVASTATIN CALCIUM 10 MG PO TABS: ORAL_TABLET | ORAL | Status: DC

## 2015-02-27 NOTE — Progress Notes (Signed)
Cardiology Office Note   Date:  02/27/2015   ID:  MAJOR CHRISTAKOS, DOB Feb 27, 1966, MRN [id number]  PCP:  Wendie Agreste, MD  Cardiologist:   Dorris Carnes, MD   F/U of PVOD and HTN    History of Present Illness: Carlos Phillips is a 49 y.o. male with a history of  HTN and PVOD  I saw him in September 2015  Wbe  I saw him last i increased lipitor to 27  H is followed by [facility]  hasd L pop and tibial embolectomy after L ext iliac stenting in December 2014.  S/P fasciotomy    Lipds in march LDL was 79  HDL was 49     He denies CP  Breathing is OK   Fairly active      Current Outpatient Prescriptions  Medication Sig Dispense Refill  . amLODipine (NORVASC) 10 MG tablet TAKE ONE TABLET BY MOUTH ONCE DAILY "OV NEEDED FOR REFILLS" 90 tablet 0  . atorvastatin (LIPITOR) 10 MG tablet Take one tablet by mouth once daily.   "NEEDS OFFICE VISIT FOR ADDITIONAL REFILLS" 30 tablet 2  . clobetasol (OLUX) 0.05 % topical foam APPLY TOPICALLY 2 TIMES DAILY 100 g 5  . cloNIDine (CATAPRES) 0.2 MG tablet TAKE ONE TABLET BY MOUTH TWICE DAILY 180 tablet 1  . clopidogrel (PLAVIX) 75 MG tablet Take 1 tablet (75 mg total) by mouth daily with breakfast. 90 tablet 1  . hydrochlorothiazide (HYDRODIURIL) 25 MG tablet TAKE ONE TABLET BY MOUTH ONCE DAILY. "OV NEEDED FOR REFILLS" 90 tablet 0  . Multiple Vitamins-Minerals (MULTIVITAMIN WITH MINERALS) tablet Take 1 tablet by mouth daily.    . sildenafil (VIAGRA) 100 MG tablet Take 0.5-1 tablets (50-100 mg total) by mouth daily as needed for erectile dysfunction. 5 tablet 5   Current Facility-Administered Medications  Medication Dose Route Frequency Provider Last Rate Last Dose  . Tdap (BOOSTRIX) injection 0.5 mL  0.5 mL Intramuscular Once Wendie Agreste, MD        Allergies:   Morphine and related   Past Medical History  Diagnosis Date  . Hypertension   . DVT (deep venous thrombosis) ([place])   . Hyperlipidemia     Past Surgical History  Procedure  Laterality Date  . Embolectomy Left 02/25/2013    Procedure: Left Popliteal EMBOLECTOMY Poss fasciotomy;  Surgeon: Elam Dutch, MD;  Location: [facility] OR;  Service: Vascular;  Laterality: Left;  Left poplital and Tibial embolectomy with four compartment Fasciotomy with vein patch angioplasty left popliteal artery.  . Abdominal aortagram N/A 02/28/2013    Procedure: ABDOMINAL Maxcine Ham;  Surgeon: Conrad , MD;  Location: [facility] CATH LAB;  Service: Cardiovascular;  Laterality: N/A;     Social History:  The patient  reports that he quit smoking about 2 years ago. His smoking use included Cigarettes. He smoked 1.00 pack per day. He has never used smokeless tobacco. He reports that he does not drink alcohol or use illicit drugs.   Family History:  The patient's family history includes Asthma in his son; Diabetes in his mother; Heart disease in his father.    ROS:  Please see the history of present illness. All other systems are reviewed and  Negative to the above problem except as noted.    PHYSICAL EXAM: VS:  BP 134/78 mmHg  Pulse 76  Ht 6' (1.829 m)  Wt 180.532 kg (398 lb)  BMI 53.97 kg/m2  GEN: Well nourished, well developed, in no acute distress HEENT:  normal Neck: no JVD, carotid bruits, or masses Cardiac: RRR; no murmurs, rubs, or gallops,no edema  Respiratory:  clear to auscultation bilaterally, normal work of breathing GI: soft, nontender, nondistended, + BS  No hepatomegaly  MS: no deformity Moving all extremities   Skin: warm and dry, no rash Neuro:  Strength and sensation are intact Psych: euthymic mood, full affect   EKG:  EKG is ordered today.  SR 77    Lipid Panel    Component Value Date/Time   CHOL 154 06/15/2014 1004   TRIG 129 06/15/2014 1004   HDL 49 06/15/2014 1004   CHOLHDL 3.1 06/15/2014 1004   VLDL 26 06/15/2014 1004   LDLCALC 79 06/15/2014 1004      Wt Readings from Last 3 Encounters:  02/27/15 180.532 kg (398 lb)  12/12/14 175.996 kg (388 lb)    06/15/14 176.449 kg (389 lb)      ASSESSMENT AND PLAN:  1.  HTN  BP is OK    2.  PVOD  FOllowed by [facility]    3.  HL  Continue lipitor  Will set for refills.  LDL in march was 64      Signed, Dorris Carnes, MD  02/27/2015 2:26 PM    [address], [place], [place]  [address] Phone: [phone]; Fax: [phone]

## 2015-02-27 NOTE — Patient Instructions (Signed)
Your physician recommends that you continue on your current medications as directed. Please refer to the Current Medication list given to you today. Your physician wants you to follow-up in: 1 year with Dr. Ross.  You will receive a reminder letter in the mail two months in advance. If you don't receive a letter, please call our office to schedule the follow-up appointment.  

## 2015-04-11 ENCOUNTER — Telehealth: Payer: Self-pay | Admitting: Family Medicine

## 2015-04-11 NOTE — Telephone Encounter (Signed)
Refill on generic Plavix. Last OV was 06/15/2014.  [phone] (wife)

## 2015-04-13 NOTE — Telephone Encounter (Signed)
This medication has not been Rx'd since 01/2014. Is he supposed to be on this? [name], you saw him last.

## 2015-04-14 MED ORDER — CLOPIDOGREL BISULFATE 75 MG PO TABS: 75.0000 mg | ORAL_TABLET | Freq: Every day | ORAL | Status: DC

## 2015-04-14 NOTE — Telephone Encounter (Signed)
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 Advise needs 2 separate appt, he needs to make appt at 104 for annual.

## 2015-04-14 NOTE — Telephone Encounter (Signed)
Lm to call me back to clarify. I will go ahead and refill his plavix since he should still be on it for h/o iliac stent and other comorbidities. He needs fu with Dr Carlota Raspberry in March

## 2015-04-23 ENCOUNTER — Other Ambulatory Visit: Payer: Self-pay | Admitting: Family Medicine

## 2015-04-25 ENCOUNTER — Ambulatory Visit (INDEPENDENT_AMBULATORY_CARE_PROVIDER_SITE_OTHER): Payer: 59 | Admitting: Family Medicine

## 2015-04-25 VITALS — BP 128/86 | HR 73 | Temp 98.8°F | Resp 16 | Ht 72.0 in | Wt 399.0 lb

## 2015-04-25 DIAGNOSIS — R635 Abnormal weight gain: Secondary | ICD-10-CM | POA: Diagnosis not present

## 2015-04-25 DIAGNOSIS — I10 Essential (primary) hypertension: Secondary | ICD-10-CM | POA: Diagnosis not present

## 2015-04-25 DIAGNOSIS — E11620 Type 2 diabetes mellitus with diabetic dermatitis: Secondary | ICD-10-CM

## 2015-04-25 DIAGNOSIS — L282 Other prurigo: Secondary | ICD-10-CM

## 2015-04-25 DIAGNOSIS — I739 Peripheral vascular disease, unspecified: Secondary | ICD-10-CM | POA: Diagnosis not present

## 2015-04-25 DIAGNOSIS — R21 Rash and other nonspecific skin eruption: Secondary | ICD-10-CM

## 2015-04-25 DIAGNOSIS — E785 Hyperlipidemia, unspecified: Secondary | ICD-10-CM | POA: Diagnosis not present

## 2015-04-25 LAB — COMPLETE METABOLIC PANEL WITH GFR
ALT: 21 U/L (ref 9–46)
AST: 15 U/L (ref 10–35)
Albumin: 3.6 g/dL (ref 3.6–5.1)
Alkaline Phosphatase: 59 U/L (ref 40–115)
BUN: 15 mg/dL (ref 7–25)
CO2: 27 mmol/L (ref 20–31)
Calcium: 9.4 mg/dL (ref 8.6–10.3)
Chloride: 104 mmol/L (ref 98–110)
Creat: 1.28 mg/dL (ref 0.70–1.33)
GFR, Est African American: 75 mL/min (ref >=60)
GFR, Est Non African American: 65 mL/min (ref >=60)
Glucose, Bld: 128 mg/dL — ABNORMAL HIGH (ref 65–99)
Potassium: 4.1 mmol/L (ref 3.5–5.3)
Sodium: 139 mmol/L (ref 135–146)
Total Bilirubin: 0.4 mg/dL (ref 0.2–1.2)
Total Protein: 6.4 g/dL (ref 6.1–8.1)

## 2015-04-25 LAB — POCT CBC
Granulocyte percent: 68.6 %G (ref 37–80)
HCT, POC: 44.3 % (ref 43.5–53.7)
Hemoglobin: 15.2 g/dL (ref 14.1–18.1)
Lymph, poc: 2.6 (ref 0.6–3.4)
MCH, POC: 29.4 pg (ref 27–31.2)
MCHC: 34.4 g/dL (ref 31.8–35.4)
MCV: 85.6 fL (ref 80–97)
MID (cbc): 0.3 (ref 0–0.9)
MPV: 7.5 fL (ref 0–99.8)
POC Granulocyte: 6.2 (ref 2–6.9)
POC LYMPH PERCENT: 28.6 %L (ref 10–50)
POC MID %: 2.8 %M (ref 0–12)
Platelet Count, POC: 256 10*3/uL (ref 142–424)
RBC: 5.18 M/uL (ref 4.69–6.13)
RDW, POC: 15 %
WBC: 9.1 10*3/uL (ref 4.6–10.2)

## 2015-04-25 LAB — POCT GLYCOSYLATED HEMOGLOBIN (HGB A1C): Hemoglobin A1C: 6.5

## 2015-04-25 LAB — TSH: TSH: 0.675 u[IU]/mL (ref 0.350–4.500)

## 2015-04-25 MED ORDER — METFORMIN HCL 500 MG PO TABS: ORAL_TABLET | ORAL | Status: DC

## 2015-04-25 NOTE — Progress Notes (Signed)
Chief Complaint:  Chief Complaint  Patient presents with  . Rash    pt states it may be eczema. stomach, chest, ang legs x 2 months  . Medication Refill    plavix    HPI: Carlos Phillips is a 50 y.o. male who reports to [facility] today complaining of :  1. Rash for the last 1-2 months, it itches and he scratches it, it is diffuse, on his chest and back and legs, he was given steroid cream with some releif about 10 months ago.  He thought it might be the soap he was using but now switched to [employer], he uses a lotion from the dollar store, he uses gain detergent and also TIDE. He is on PLavix but this was not present then. Deneis nausea, vomiting, abdomian pain, diarrhea, crohns disease or colitis. His liver enzymes, and also HbA!c were both within normal last annual 05/2014. No new medicines.   2. Would like refills on plavix. Compliant with all meds except lipitor, lazy and did not pick up meds when refilled. He has been without lipitor for 2 months, he will pick it up today/   Wt Readings from Last 3 Encounters:  04/25/15 399 lb (180.985 kg)  02/27/15 398 lb (180.532 kg)  12/12/14 388 lb (175.996 kg)      Past Medical History  Diagnosis Date  . Hypertension   . DVT (deep venous thrombosis) ([place])   . Hyperlipidemia    Past Surgical History  Procedure Laterality Date  . Embolectomy Left 02/25/2013    Procedure: Left Popliteal EMBOLECTOMY Poss fasciotomy;  Surgeon: Elam Dutch, MD;  Location: [facility] OR;  Service: Vascular;  Laterality: Left;  Left poplital and Tibial embolectomy with four compartment Fasciotomy with vein patch angioplasty left popliteal artery.  . Abdominal aortagram N/A 02/28/2013    Procedure: ABDOMINAL Maxcine Ham;  Surgeon: Conrad Concord, MD;  Location: [facility] CATH LAB;  Service: Cardiovascular;  Laterality: N/A;   Social History   Social History  . Marital Status: Married    Spouse Name: N/A  . Number of Children: N/A  . Years of Education: N/A    Social History Main Topics  . Smoking status: Former Smoker -- 1.00 packs/day    Types: Cigarettes    Quit date: 02/25/2013  . Smokeless tobacco: Never Used  . Alcohol Use: No  . Drug Use: No  . Sexual Activity: Yes   Other Topics Concern  . None   Social History Narrative   Family History  Problem Relation Age of Onset  . Diabetes Mother   . Heart disease Father   . Asthma Son    Allergies  Allergen Reactions  . Morphine And Related Nausea And Vomiting   Prior to Admission medications   Medication Sig Start Date End Date Taking? Authorizing Provider  amLODipine (NORVASC) 10 MG tablet TAKE ONE TABLET BY MOUTH ONCE DAILY "OV NEEDED FOR REFILLS" 09/24/14  Yes Chelle Jeffery, PA-C  atorvastatin (LIPITOR) 10 MG tablet Take one tablet by mouth once daily. 02/27/15  Yes Fay Records, MD  cloNIDine (CATAPRES) 0.2 MG tablet TAKE ONE TABLET BY MOUTH TWICE DAILY 02/03/14  Yes Wendie Agreste, MD  clopidogrel (PLAVIX) 75 MG tablet Take 1 tablet (75 mg total) by mouth daily with breakfast. 04/14/15  Yes Thao P Le, DO  hydrochlorothiazide (HYDRODIURIL) 25 MG tablet TAKE ONE TABLET BY MOUTH ONCE DAILY. "OV NEEDED FOR REFILLS" 09/24/14  Yes Harrison Mons, PA-C  Multiple  Vitamins-Minerals (MULTIVITAMIN WITH MINERALS) tablet Take 1 tablet by mouth daily.   Yes Historical Provider, MD  clobetasol (OLUX) 0.05 % topical foam APPLY TOPICALLY 2 TIMES DAILY Patient not taking: Reported on 04/25/2015 06/20/14   Thao P Le, DO  sildenafil (VIAGRA) 100 MG tablet Take 0.5-1 tablets (50-100 mg total) by mouth daily as needed for erectile dysfunction. 02/03/14 02/02/15  Wendie Agreste, MD     ROS: The patient denies fevers, chills, night sweats, unintentional weight loss, chest pain, palpitations, wheezing, dyspnea on exertion, nausea, vomiting, abdominal pain, dysuria, hematuria, melena, numbness, weakness, or tingling.   All other systems have been reviewed and were otherwise negative with the exception  of those mentioned in the HPI and as above.    PHYSICAL EXAM: Filed Vitals:   04/25/15 0837  BP: 128/86  Pulse: 73  Temp: 98.8 F (37.1 C)  Resp: 16   Body mass index is 54.1 kg/(m^2).   General: Alert, no acute distress HEENT:  Normocephalic, atraumatic, oropharynx patent. EOMI, PERRLA Cardiovascular:  Regular rate and rhythm, no rubs murmurs or gallops.   Respiratory: Clear to auscultation bilaterally.  No wheezes, rales, or rhonchi.  No cyanosis, no use of accessory musculature Abdominal: No organomegaly, abdomen is soft and non-tender, positive bowel sounds. No masses. Skin: + diffuse maculopapular excoriated rashes. Does not appear to be pityriasis or scabies Neurologic: Facial musculature symmetric. Psychiatric: Patient acts appropriately throughout our interaction. Lymphatic: No cervical or submandibular lymphadenopathy Musculoskeletal: Gait intact. No edema, tenderness   LABS: Results for orders placed or performed in visit on 04/25/15  POCT CBC  Result Value Ref Range   WBC 9.1 4.6 - 10.2 K/uL   Lymph, poc 2.6 0.6 - 3.4   POC LYMPH PERCENT 28.6 10 - 50 %L   MID (cbc) 0.3 0 - 0.9   POC MID % 2.8 0 - 12 %M   POC Granulocyte 6.2 2 - 6.9   Granulocyte percent 68.6 37 - 80 %G   RBC 5.18 4.69 - 6.13 M/uL   Hemoglobin 15.2 14.1 - 18.1 g/dL   HCT, POC 44.3 43.5 - 53.7 %   MCV 85.6 80 - 97 fL   MCH, POC 29.4 27 - 31.2 pg   MCHC 34.4 31.8 - 35.4 g/dL   RDW, POC 15.0 %   Platelet Count, POC 256 142 - 424 K/uL   MPV 7.5 0 - 99.8 fL  POCT glycosylated hemoglobin (Hb A1C)  Result Value Ref Range   Hemoglobin A1C 6.5      EKG/XRAY:   Primary read interpreted by [name] at [facility].   ASSESSMENT/PLAN: Encounter Diagnoses  Name Primary?  . Hyperlipidemia Yes  . Essential hypertension   . PVD (peripheral vascular disease) ([place])   . Obesity, morbid, BMI 50 or higher ([place])   . Rash and nonspecific skin eruption   . Pruritic rash   . Weight gain   . Type 2 diabetes  mellitus with diabetic dermatitis, without long-term current use of insulin ([place])    50 y/o male with PMH of PVD s/p stent, HTN, HLD and recurrent rash Newly dx DM Rx metformin 500 mg daily then to 1 gram daily, recheck in 3 months Refer to dermatology, optho, and also diabetes education Recommend : ADA diet, BP goal <140/90, daily foot exams, tobacco cessation if smoking, annual eye exam, annual flu vaccine, PNA vaccine if age and time appropriate.  Fu in 3 months  Refilled plavix Labs pending   Gross sideeffects, risk  and benefits, and alternatives of medications d/w patient. Patient is aware that all medications have potential sideeffects and we are unable to predict every sideeffect or drug-drug interaction that may occur.  Thao Le DO  04/25/2015 10:09 AM

## 2015-04-25 NOTE — Patient Instructions (Signed)
Diabetes and Standards of Medical Care Diabetes is complicated. You may find that your diabetes team includes a dietitian, nurse, diabetes educator, eye doctor, and more. To help everyone know what is going on and to help you get the care you deserve, the following schedule of care was developed to help keep you on track. Below are the tests, exams, vaccines, medicines, education, and plans you will need. HbA1c test This test shows how well you have controlled your glucose over the past 2-3 months. It is used to see if your diabetes management plan needs to be adjusted.   It is performed at least 2 times a year if you are meeting treatment goals.  It is performed 4 times a year if therapy has changed or if you are not meeting treatment goals. Blood pressure test  This test is performed at every routine medical visit. The goal is less than 140/90 mm Hg for most people, but 130/80 mm Hg in some cases. Ask your health care provider about your goal. Dental exam  Follow up with the dentist regularly. Eye exam  If you are diagnosed with type 1 diabetes as a child, get an exam upon reaching the age of 6 years or older and having had diabetes for 3-5 years. Yearly eye exams are recommended after that initial eye exam.  If you are diagnosed with type 1 diabetes as an adult, get an exam within 5 years of diagnosis and then yearly.  If you are diagnosed with type 2 diabetes, get an exam as soon as possible after the diagnosis and then yearly. Foot care exam  Visual foot exams are performed at every routine medical visit. The exams check for cuts, injuries, or other problems with the feet.  You should have a complete foot exam performed every year. This exam includes an inspection of the structure and skin of your feet, a check of the pulses in your feet, and a check of the sensation in your feet.  Type 1 diabetes: The first exam is performed 5 years after diagnosis.  Type 2 diabetes: The first  exam is performed at the time of diagnosis.  Check your feet nightly for cuts, injuries, or other problems with your feet. Tell your health care provider if anything is not healing. Kidney function test (urine microalbumin)  This test is performed once a year.  Type 1 diabetes: The first test is performed 5 years after diagnosis.  Type 2 diabetes: The first test is performed at the time of diagnosis.  A serum creatinine and estimated glomerular filtration rate (eGFR) test is done once a year to assess the level of chronic kidney disease (CKD), if present. Lipid profile (cholesterol, HDL, LDL, triglycerides)  Performed every 5 years for most people.  The goal for LDL is less than 100 mg/dL. If you are at high risk, the goal is less than 70 mg/dL.  The goal for HDL is 40 mg/dL-50 mg/dL for men and 50 mg/dL-60 mg/dL for women. An HDL cholesterol of 60 mg/dL or higher gives some protection against heart disease.  The goal for triglycerides is less than 150 mg/dL. Immunizations  The flu (influenza) vaccine is recommended yearly for every person 51 months of age or older who has diabetes.  The pneumonia (pneumococcal) vaccine is recommended for every person 51 years of age or older who has diabetes. Adults 51 years of age or older may receive the pneumonia vaccine as a series of two separate shots.  The hepatitis B  vaccine is recommended for adults shortly after they have been diagnosed with diabetes.  The Tdap (tetanus, diphtheria, and pertussis) vaccine should be given:  According to normal childhood vaccination schedules, for children.  Every 10 years, for adults who have diabetes. Diabetes self-management education  Education is recommended at diagnosis and ongoing as needed. Treatment plan  Your treatment plan is reviewed at every medical visit.   This information is not intended to replace advice given to you by your health care provider. Make sure you discuss any questions you  have with your health care provider.   Document Released: 01/09/2009 Document Revised: 04/04/2014 Document Reviewed: 08/14/2012 Elsevier Interactive Patient Education 2016 [employer]  

## 2015-05-02 ENCOUNTER — Ambulatory Visit (INDEPENDENT_AMBULATORY_CARE_PROVIDER_SITE_OTHER): Payer: 59 | Admitting: Emergency Medicine

## 2015-05-02 VITALS — BP 110/70 | HR 75 | Temp 98.1°F | Resp 18 | Ht 72.0 in | Wt 399.8 lb

## 2015-05-02 DIAGNOSIS — Z024 Encounter for examination for driving license: Secondary | ICD-10-CM

## 2015-05-02 DIAGNOSIS — Z021 Encounter for pre-employment examination: Secondary | ICD-10-CM

## 2015-05-02 NOTE — Progress Notes (Signed)
By signing my name below, I, Moises Blood, attest that this documentation has been prepared under the direction and in the presence of Arlyss Queen, MD. Electronically Signed: Moises Blood, [place]. 05/02/2015 , 9:24 AM .  Patient was seen in room 2 .  Chief Complaint:  Chief Complaint  Patient presents with  . Employment Physical    DOT    HPI: Carlos Phillips is a 50 y.o. male who reports to [facility] today for DOT physical.   Cardiology He sees Dr. Harrington Challenger for cardiology. He hasn't had a sleep study to check for sleep apnea. He denies any symptoms consistent with sleep apnea. He has a history of blood clot removed from his left leg. Initially, he felt cramp in the back of his leg and upon examination, there was no pulse.   Dermatology He has an appointment with dermatology in 2 days.   HTN He's been doing well on his medication. His BP today was 110/70.   Work He drives locally for work.   Past Medical History  Diagnosis Date  . Hypertension   . DVT (deep venous thrombosis) ([place])   . Hyperlipidemia    Past Surgical History  Procedure Laterality Date  . Embolectomy Left 02/25/2013    Procedure: Left Popliteal EMBOLECTOMY Poss fasciotomy;  Surgeon: Elam Dutch, MD;  Location: [facility] OR;  Service: Vascular;  Laterality: Left;  Left poplital and Tibial embolectomy with four compartment Fasciotomy with vein patch angioplasty left popliteal artery.  . Abdominal aortagram N/A 02/28/2013    Procedure: ABDOMINAL Maxcine Ham;  Surgeon: Conrad Amagansett, MD;  Location: [facility] CATH LAB;  Service: Cardiovascular;  Laterality: N/A;   Social History   Social History  . Marital Status: Married    Spouse Name: N/A  . Number of Children: N/A  . Years of Education: N/A   Social History Main Topics  . Smoking status: Former Smoker -- 1.00 packs/day    Types: Cigarettes    Quit date: 02/25/2013  . Smokeless tobacco: Never Used  . Alcohol Use: No  . Drug Use: No  . Sexual Activity: Yes    Other Topics Concern  . None   Social History Narrative   Family History  Problem Relation Age of Onset  . Diabetes Mother   . Heart disease Father   . Asthma Son    Allergies  Allergen Reactions  . Morphine And Related Nausea And Vomiting   Prior to Admission medications   Medication Sig Start Date End Date Taking? Authorizing Provider  amLODipine (NORVASC) 10 MG tablet TAKE ONE TABLET BY MOUTH ONCE DAILY "OV NEEDED FOR REFILLS" 09/24/14  Yes Chelle Jeffery, PA-C  atorvastatin (LIPITOR) 10 MG tablet Take one tablet by mouth once daily. 02/27/15  Yes Fay Records, MD  clobetasol (OLUX) 0.05 % topical foam APPLY TOPICALLY 2 TIMES DAILY 06/20/14  Yes Thao P Le, DO  cloNIDine (CATAPRES) 0.2 MG tablet TAKE ONE TABLET BY MOUTH TWICE DAILY 02/03/14  Yes Wendie Agreste, MD  clopidogrel (PLAVIX) 75 MG tablet Take 1 tablet (75 mg total) by mouth daily with breakfast. 04/14/15  Yes Thao P Le, DO  hydrochlorothiazide (HYDRODIURIL) 25 MG tablet TAKE ONE TABLET BY MOUTH ONCE DAILY. "OV NEEDED FOR REFILLS" 09/24/14  Yes Chelle Jeffery, PA-C  metFORMIN (GLUCOPHAGE) 500 MG tablet Take 1 tab po daily for the next 1 week, then 2 tabs PO daily if tolerate 04/25/15  Yes Thao P Le, DO  Multiple Vitamins-Minerals (MULTIVITAMIN WITH MINERALS) tablet  Take 1 tablet by mouth daily.   Yes Historical Provider, MD  sildenafil (VIAGRA) 100 MG tablet Take 0.5-1 tablets (50-100 mg total) by mouth daily as needed for erectile dysfunction. 02/03/14 02/02/15  Wendie Agreste, MD     ROS:  Constitutional: negative for fever, chills, night sweats, weight changes, or fatigue  HEENT: negative for vision changes, hearing loss, congestion, rhinorrhea, ST, epistaxis, or sinus pressure Cardiovascular: negative for chest pain or palpitations Respiratory: negative for hemoptysis, wheezing, shortness of breath, or cough Abdominal: negative for abdominal pain, nausea, vomiting, diarrhea, or constipation Dermatological: negative  for rash Neurologic: negative for headache, dizziness, or syncope All other systems reviewed and are otherwise negative with the exception to those above and in the HPI.  PHYSICAL EXAM: Filed Vitals:   05/02/15 0905  BP: 110/70  Pulse: 75  Temp: 98.1 F (36.7 C)  Resp: 18   Body mass index is 54.21 kg/(m^2).   General: Alert, no acute distress; Overweight HEENT:  Normocephalic, atraumatic, oropharynx patent. Eye: Juliette Mangle [facility] Cardiovascular:  Regular rate and rhythm, no rubs murmurs or gallops.  No Carotid bruits, radial pulse intact. No pedal edema.  Respiratory: Clear to auscultation bilaterally.  No wheezes, rales, or rhonchi.  No cyanosis, no use of accessory musculature Abdominal: No organomegaly, abdomen is soft and non-tender, positive bowel sounds. No masses. Musculoskeletal: Gait intact. No edema, tenderness Skin: No rashes. Neurologic: Facial musculature symmetric. Psychiatric: Patient acts appropriately throughout our interaction.  Lymphatic: No cervical or submandibular lymphadenopathy Genitourinary/Anorectal: No acute findings Large scar left medial leg with stasis changes,   LABS:   EKG/XRAY:   Primary read interpreted by Dr. Everlene Farrier [facility].   ASSESSMENT/PLAN: Pt has history of HTN and hyperglycemia. He has regular follow ups with his cardiologist and PCP. Has no symptoms consistent with sleep apnea. Qualifies for a 1-year card due to HTN.   Gross sideeffects, risk and benefits, and alternatives of medications d/w patient. Patient is aware that all medications have potential sideeffects and we are unable to predict every sideeffect or drug-drug interaction that may occur.  Arlyss Queen MD 05/02/2015 9:24 AM

## 2015-05-03 DIAGNOSIS — E11620 Type 2 diabetes mellitus with diabetic dermatitis: Secondary | ICD-10-CM | POA: Insufficient documentation

## 2015-05-07 ENCOUNTER — Ambulatory Visit (INDEPENDENT_AMBULATORY_CARE_PROVIDER_SITE_OTHER): Payer: 59 | Admitting: Urgent Care

## 2015-05-07 VITALS — BP 105/77 | HR 111 | Temp 99.5°F | Resp 20 | Ht 78.0 in | Wt 386.8 lb

## 2015-05-07 DIAGNOSIS — I10 Essential (primary) hypertension: Secondary | ICD-10-CM | POA: Diagnosis not present

## 2015-05-07 DIAGNOSIS — R1013 Epigastric pain: Secondary | ICD-10-CM

## 2015-05-07 DIAGNOSIS — R197 Diarrhea, unspecified: Secondary | ICD-10-CM | POA: Diagnosis not present

## 2015-05-07 DIAGNOSIS — R112 Nausea with vomiting, unspecified: Secondary | ICD-10-CM

## 2015-05-07 DIAGNOSIS — R52 Pain, unspecified: Secondary | ICD-10-CM

## 2015-05-07 DIAGNOSIS — K529 Noninfective gastroenteritis and colitis, unspecified: Secondary | ICD-10-CM | POA: Diagnosis not present

## 2015-05-07 DIAGNOSIS — E119 Type 2 diabetes mellitus without complications: Secondary | ICD-10-CM

## 2015-05-07 DIAGNOSIS — R6883 Chills (without fever): Secondary | ICD-10-CM | POA: Diagnosis not present

## 2015-05-07 LAB — POCT CBC
Granulocyte percent: 52 %G (ref 37–80)
HCT, POC: 51.3 % (ref 43.5–53.7)
Hemoglobin: 17.3 g/dL (ref 14.1–18.1)
Lymph, poc: 2.1 (ref 0.6–3.4)
MCH, POC: 28.7 pg (ref 27–31.2)
MCHC: 33.7 g/dL (ref 31.8–35.4)
MCV: 85.2 fL (ref 80–97)
MID (cbc): 0.2 (ref 0–0.9)
MPV: 7.1 fL (ref 0–99.8)
POC Granulocyte: 2.4 (ref 2–6.9)
POC LYMPH PERCENT: 44.6 %L (ref 10–50)
POC MID %: 3.4 %M (ref 0–12)
Platelet Count, POC: 229 10*3/uL (ref 142–424)
RBC: 6.02 M/uL (ref 4.69–6.13)
RDW, POC: 14.9 %
WBC: 4.6 10*3/uL (ref 4.6–10.2)

## 2015-05-07 LAB — POCT URINALYSIS DIP (MANUAL ENTRY)
Blood, UA: NEGATIVE
Glucose, UA: NEGATIVE
Ketones, POC UA: NEGATIVE
Leukocytes, UA: NEGATIVE
Nitrite, UA: NEGATIVE
Protein Ur, POC: 100 — AB
Spec Grav, UA: 1.025
Urobilinogen, UA: 0.2
pH, UA: 5.5

## 2015-05-07 LAB — POC MICROSCOPIC URINALYSIS (UMFC): Mucus: ABSENT

## 2015-05-07 LAB — GLUCOSE, POCT (MANUAL RESULT ENTRY): POC Glucose: 134 mg/dl — AB (ref 70–99)

## 2015-05-07 LAB — POCT INFLUENZA A/B
Influenza A, POC: NEGATIVE
Influenza B, POC: NEGATIVE

## 2015-05-07 MED ORDER — ONDANSETRON 4 MG PO TBDP
8.0000 mg | ORAL_TABLET | Freq: Once | ORAL | Status: AC
  Administered 2015-05-07: 8 mg via ORAL

## 2015-05-07 MED ORDER — ONDANSETRON 8 MG PO TBDP: 8.0000 mg | ORAL_TABLET | Freq: Three times a day (TID) | ORAL | Status: DC | PRN

## 2015-05-07 NOTE — Patient Instructions (Addendum)
Viral Gastroenteritis Viral gastroenteritis is also known as stomach flu. This condition affects the stomach and intestinal tract. It can cause sudden diarrhea and vomiting. The illness typically lasts 3 to 8 days. Most people develop an immune response that eventually gets rid of the virus. While this natural response develops, the virus can make you quite ill. CAUSES  Many different viruses can cause gastroenteritis, such as rotavirus or noroviruses. You can catch one of these viruses by consuming contaminated food or water. You may also catch a virus by sharing utensils or other personal items with an infected person or by touching a contaminated surface. SYMPTOMS  The most common symptoms are diarrhea and vomiting. These problems can cause a severe loss of body fluids (dehydration) and a body salt (electrolyte) imbalance. Other symptoms may include:  Fever.  Headache.  Fatigue.  Abdominal pain. DIAGNOSIS  Your caregiver can usually diagnose viral gastroenteritis based on your symptoms and a physical exam. A stool sample may also be taken to test for the presence of viruses or other infections. TREATMENT  This illness typically goes away on its own. Treatments are aimed at rehydration. The most serious cases of viral gastroenteritis involve vomiting so severely that you are not able to keep fluids down. In these cases, fluids must be given through an intravenous line (IV). HOME CARE INSTRUCTIONS   Drink enough fluids to keep your urine clear or pale yellow. Drink small amounts of fluids frequently and increase the amounts as tolerated.  Ask your caregiver for specific rehydration instructions.  Avoid:  Foods high in sugar.  Alcohol.  Carbonated drinks.  Tobacco.  Juice.  Caffeine drinks.  Extremely hot or cold fluids.  Fatty, greasy foods.  Too much intake of anything at one time.  Dairy products until 24 to 48 hours after diarrhea stops.  You may consume probiotics.  Probiotics are active cultures of beneficial bacteria. They may lessen the amount and number of diarrheal stools in adults. Probiotics can be found in yogurt with active cultures and in supplements.  Wash your hands well to avoid spreading the virus.  Only take over-the-counter or prescription medicines for pain, discomfort, or fever as directed by your caregiver. Do not give aspirin to children. Antidiarrheal medicines are not recommended.  Ask your caregiver if you should continue to take your regular prescribed and over-the-counter medicines.  Keep all follow-up appointments as directed by your caregiver. SEEK IMMEDIATE MEDICAL CARE IF:   You are unable to keep fluids down.  You do not urinate at least once every 6 to 8 hours.  You develop shortness of breath.  You notice blood in your stool or vomit. This may look like coffee grounds.  You have abdominal pain that increases or is concentrated in one small area (localized).  You have persistent vomiting or diarrhea.  You have a fever.  The patient is a child younger than 3 months, and he or she has a fever.  The patient is a child older than 3 months, and he or she has a fever and persistent symptoms.  The patient is a child older than 3 months, and he or she has a fever and symptoms suddenly get worse.  The patient is a baby, and he or she has no tears when crying. MAKE SURE YOU:   Understand these instructions.  Will watch your condition.  Will get help right away if you are not doing well or get worse.   This information is not intended to replace  advice given to you by your health care provider. Make sure you discuss any questions you have with your health care provider.   Document Released: 03/14/2005 Document Revised: 06/06/2011 Document Reviewed: 12/29/2010 Elsevier Interactive Patient Education 2016 [employer]. Metformin tablets What is this medicine? METFORMIN (met FOR min) is used to treat type 2  diabetes. It helps to control blood sugar. Treatment is combined with diet and exercise. This medicine can be used alone or with other medicines for diabetes. This medicine may be used for other purposes; ask your health care provider or pharmacist if you have questions. What should I tell my health care provider before I take this medicine? They need to know if you have any of these conditions: -anemia -frequently drink alcohol-containing beverages -become easily dehydrated -heart attack -heart failure that is treated with medications -kidney disease -liver disease -polycystic ovary syndrome -serious infection or injury -vomiting -an unusual or allergic reaction to metformin, other medicines, foods, dyes, or preservatives -pregnant or trying to get pregnant -breast-feeding How should I use this medicine? Take this medicine by mouth. Take it with meals. Swallow the tablets with a drink of water. Follow the directions on the prescription label. Take your medicine at regular intervals. Do not take your medicine more often than directed. Talk to your pediatrician regarding the use of this medicine in children. While this drug may be prescribed for children as young as 19 years of age for selected conditions, precautions do apply. Overdosage: If you think you have taken too much of this medicine contact a poison control center or emergency room at once. NOTE: This medicine is only for you. Do not share this medicine with others. What if I miss a dose? If you miss a dose, take it as soon as you can. If it is almost time for your next dose, take only that dose. Do not take double or extra doses. What may interact with this medicine? Do not take this medicine with any of the following medications: -dofetilide -gatifloxacin -certain contrast medicines given before X-rays, CT scans, MRI, or other procedures This medicine may also interact with the following medications: -acetazolamide -certain  medicines for HIV infection or hepatitis, like adefovir, emtricitabine, entecavir, lamivudine, or tenofovir -cimetidine -crizotinib -digoxin -diuretics -male hormones, like estrogens or progestins and birth control pills -glycopyrrolate -isoniazid -lamotrigine -medicines for blood pressure, heart disease, irregular heart beat -memantine -midodrine -methazolamide -morphine -nicotinic acid -phenothiazines like chlorpromazine, mesoridazine, prochlorperazine, thioridazine -phenytoin -procainamide -propantheline -quinidine -quinine -ranitidine -ranolazine -steroid medicines like prednisone or cortisone -stimulant medicines for attention disorders, weight loss, or to stay awake -thyroid medicines -topiramate -trimethoprim -trospium -vancomycin -vandetanib -zonisamide This list may not describe all possible interactions. Give your health care provider a list of all the medicines, herbs, non-prescription drugs, or dietary supplements you use. Also tell them if you smoke, drink alcohol, or use illegal drugs. Some items may interact with your medicine. What should I watch for while using this medicine? Visit your doctor or health care professional for regular checks on your progress. A test called the HbA1C (A1C) will be monitored. This is a simple blood test. It measures your blood sugar control over the last 2 to 3 months. You will receive this test every 3 to 6 months. Learn how to check your blood sugar. Learn the symptoms of low and high blood sugar and how to manage them. Always carry a quick-source of sugar with you in case you have symptoms of low blood sugar. Examples include hard  sugar candy or glucose tablets. Make sure others know that you can choke if you eat or drink when you develop serious symptoms of low blood sugar, such as seizures or unconsciousness. They must get medical help at once. Tell your doctor or health care professional if you have high blood sugar. You  might need to change the dose of your medicine. If you are sick or exercising more than usual, you might need to change the dose of your medicine. Do not skip meals. Ask your doctor or health care professional if you should avoid alcohol. Many nonprescription cough and cold products contain sugar or alcohol. These can affect blood sugar. This medicine may cause ovulation in premenopausal women who do not have regular monthly periods. This may increase your chances of becoming pregnant. You should not take this medicine if you become pregnant or think you may be pregnant. Talk with your doctor or health care professional about your birth control options while taking this medicine. Contact your doctor or health care professional right away if think you are pregnant. If you are going to need surgery, a MRI, CT scan, or other procedure, tell your doctor that you are taking this medicine. You may need to stop taking this medicine before the procedure. Wear a medical ID bracelet or chain, and carry a card that describes your disease and details of your medicine and dosage times. What side effects may I notice from receiving this medicine? Side effects that you should report to your doctor or health care professional as soon as possible: -allergic reactions like skin rash, itching or hives, swelling of the face, lips, or tongue -breathing problems -feeling faint or lightheaded, falls -muscle aches or pains -signs and symptoms of low blood sugar such as feeling anxious, confusion, dizziness, increased hunger, unusually weak or tired, sweating, shakiness, cold, irritable, headache, blurred vision, fast heartbeat, loss of consciousness -slow or irregular heartbeat -unusual stomach pain or discomfort -unusually tired or weak Side effects that usually do not require medical attention (report to your doctor or health care professional if they continue or are  bothersome): -diarrhea -headache -heartburn -metallic taste in mouth -nausea -stomach gas, upset This list may not describe all possible side effects. Call your doctor for medical advice about side effects. You may report side effects to FDA at 1-800-FDA-1088. Where should I keep my medicine? Keep out of the reach of children. Store at room temperature between 15 and 30 degrees C (59 and 86 degrees F). Protect from moisture and light. Throw away any unused medicine after the expiration date. NOTE: This sheet is a summary. It may not cover all possible information. If you have questions about this medicine, talk to your doctor, pharmacist, or health care provider.    2016, Elsevier/Gold Standard. (2013-08-27 22:14:40)

## 2015-05-07 NOTE — Progress Notes (Signed)
MRN: [id number] DOB: 09/18/1965  Subjective:   Carlos Phillips is a 50 y.o. male with pmh of DM, HTN, DVT presenting for chief complaint of Abdominal Pain; Vomiting; Diarrhea; and Fever  Reports ~2 day history of nausea with vomiting (all night), diarrhea (~5 BM/day), subjective fever, chills, epigastric belly pain, dry cough, headache, mild body aches. Has tried NyQuil, Delsym, BC powder with relief of headache. Patient has not been able to keep anything down, is trying to hydrate but having a hard time with this too. Denies hematemesis, bloody stools, chest pain. Patient was recently started on Metformin 500mg  for an a1c of 6.5 on 04/25/2015. He also manages his BP very well with amlodipine and HCTZ.  Carlos Phillips has a current medication list which includes the following prescription(s): amlodipine, atorvastatin, clobetasol, clonidine, clopidogrel, hydrochlorothiazide, metformin, multivitamin with minerals, and sildenafil, and the following Facility-Administered Medications: tdap. Also is allergic to morphine and related.  Carlos Phillips  has a past medical history of Hypertension; DVT (deep venous thrombosis) ([place]); and Hyperlipidemia. Also  has past surgical history that includes Embolectomy (Left, 02/25/2013) and abdominal aortagram (N/A, 02/28/2013).  Objective:   Vitals: BP 105/77 mmHg  Pulse 111  Temp(Src) 99.5 F (37.5 C) (Oral)  Resp 20  Ht 6\' 6"  (1.981 m)  Wt 386 lb 12.8 oz (175.451 kg)  BMI 44.71 kg/m2  SpO2 98%  Pulse was 76 on recheck by CMA Etheleen Sia.  Physical Exam  Constitutional: He is oriented to person, place, and time. He appears well-developed and well-nourished.  HENT:  Mouth/Throat: Oropharynx is clear and moist.  Cardiovascular: Normal rate, regular rhythm and intact distal pulses.  Exam reveals no gallop and no friction rub.   No murmur heard. Pulmonary/Chest: No respiratory distress. He has no wheezes. He has no rales.  Abdominal: Soft. Bowel sounds are normal.  He exhibits no distension and no mass. There is no tenderness.  Neurological: He is alert and oriented to person, place, and time.  Skin: Skin is warm.   Results for orders placed or performed in visit on 05/07/15 (from the past 24 hour(s))  POCT urinalysis dipstick     Status: Abnormal   Collection Time: 05/07/15  1:04 PM  Result Value Ref Range   Color, UA brown (A) yellow   Clarity, UA hazy (A) clear   Glucose, UA negative negative   Bilirubin, UA small (A) negative   Ketones, POC UA negative negative   Spec Grav, UA 1.025    Blood, UA negative negative   pH, UA 5.5    Protein Ur, POC =100 (A) negative   Urobilinogen, UA 0.2    Nitrite, UA Negative Negative   Leukocytes, UA Negative Negative  POCT Microscopic Urinalysis ([facility])     Status: Abnormal   Collection Time: 05/07/15  1:04 PM  Result Value Ref Range   WBC,UR,HPF,POC Few (A) None WBC/hpf   RBC,UR,HPF,POC None None RBC/hpf   Bacteria None None, Too numerous to count   Mucus Absent Absent   Epithelial Cells, UR Per Microscopy Few (A) None, Too numerous to count cells/hpf  POCT glucose (manual entry)     Status: Abnormal   Collection Time: 05/07/15  1:04 PM  Result Value Ref Range   POC Glucose 134 (A) 70 - 99 mg/dl  POCT CBC     Status: None   Collection Time: 05/07/15  1:04 PM  Result Value Ref Range   WBC 4.6 4.6 - 10.2 K/uL   Lymph, poc 2.1 0.6 -  3.4   POC LYMPH PERCENT 44.6 10 - 50 %L   MID (cbc) 0.2 0 - 0.9   POC MID % 3.4 0 - 12 %M   POC Granulocyte 2.4 2 - 6.9   Granulocyte percent 52.0 37 - 80 %G   RBC 6.02 4.69 - 6.13 M/uL   Hemoglobin 17.3 14.1 - 18.1 g/dL   HCT, POC 51.3 43.5 - 53.7 %   MCV 85.2 80 - 97 fL   MCH, POC 28.7 27 - 31.2 pg   MCHC 33.7 31.8 - 35.4 g/dL   RDW, POC 14.9 %   Platelet Count, POC 229 142 - 424 K/uL   MPV 7.1 0 - 99.8 fL  POCT Influenza A/B     Status: None   Collection Time: 05/07/15  1:12 PM  Result Value Ref Range   Influenza A, POC Negative Negative   Influenza B, POC  Negative Negative    Assessment and Plan :   1. Gastroenteritis 2. Nausea and vomiting, intractability of vomiting not specified, unspecified vomiting type 3. Diarrhea, unspecified type 4. Abdominal pain, epigastric 5. Chills 6. Body aches - Likely undergoing viral gastroenteritis. Counseled on diagnosis and supportive care. Patient had significant improvement s/p 2L of fluids. RTC if no improvement in 1 week.  7. Essential hypertension - Continue current regimen.  8. Type 2 diabetes mellitus without complication, without long-term current use of insulin ([place]) - Hold off on increasing Metformin to BID. Patient agreed. Once he clears this illness, patient can increase to BID in 1 week.  Jaynee Eagles, PA-C [facility] [phone] 05/07/2015 12:16 PM

## 2015-05-08 LAB — COMPREHENSIVE METABOLIC PANEL
ALT: 39 U/L (ref 9–46)
AST: 46 U/L — ABNORMAL HIGH (ref 10–35)
Albumin: 4.1 g/dL (ref 3.6–5.1)
Alkaline Phosphatase: 67 U/L (ref 40–115)
BUN: 13 mg/dL (ref 7–25)
CO2: 20 mmol/L (ref 20–31)
Calcium: 9 mg/dL (ref 8.6–10.3)
Chloride: 99 mmol/L (ref 98–110)
Creat: 1.29 mg/dL (ref 0.70–1.33)
Glucose, Bld: 125 mg/dL — ABNORMAL HIGH (ref 65–99)
Potassium: 3.7 mmol/L (ref 3.5–5.3)
Sodium: 134 mmol/L — ABNORMAL LOW (ref 135–146)
Total Bilirubin: 0.6 mg/dL (ref 0.2–1.2)
Total Protein: 7.4 g/dL (ref 6.1–8.1)

## 2015-05-21 ENCOUNTER — Encounter: Payer: 59 | Attending: Family Medicine

## 2015-05-21 VITALS — Ht 72.0 in | Wt 393.2 lb

## 2015-05-21 DIAGNOSIS — E119 Type 2 diabetes mellitus without complications: Secondary | ICD-10-CM

## 2015-05-21 DIAGNOSIS — E11620 Type 2 diabetes mellitus with diabetic dermatitis: Secondary | ICD-10-CM | POA: Diagnosis present

## 2015-05-21 NOTE — Progress Notes (Signed)
Patient was seen on 05/21/15 for the first of a series of three diabetes self-management courses at [facility].  Patient Education Plan per assessed needs and concerns is to attend four course education program for Diabetes Self Management Education.  The following learning objectives were met by the patient during this class:  Describe diabetes  State some common risk factors for diabetes  Defines the role of glucose and insulin  Identifies type of diabetes and pathophysiology  Describe the relationship between diabetes and cardiovascular risk  State the members of the Healthcare Team  States the rationale for glucose monitoring  State when to test glucose  State their individual Target Range  State the importance of logging glucose readings  Describe how to interpret glucose readings  Identifies A1C target  Explain the correlation between A1c and eAG values  State symptoms and treatment of high blood glucose  State symptoms and treatment of low blood glucose  Explain proper technique for glucose testing  Identifies proper sharps disposal  Handouts given during class include:  Living Well with Diabetes book  Carb Counting and Meal Planning book  Meal Plan Card  Carbohydrate guide  Meal planning worksheet  Low Sodium Flavoring Tips  The diabetes portion plate  L1X to eAG Conversion Chart  Diabetes Medications  Diabetes Recommended Care Schedule  Support Group  Diabetes Success Plan  Core Class Satisfaction Survey  Follow-Up Plan:  Attend core 2

## 2015-05-28 ENCOUNTER — Encounter: Payer: 59 | Attending: Family Medicine

## 2015-05-28 DIAGNOSIS — E11620 Type 2 diabetes mellitus with diabetic dermatitis: Secondary | ICD-10-CM | POA: Insufficient documentation

## 2015-05-28 DIAGNOSIS — E119 Type 2 diabetes mellitus without complications: Secondary | ICD-10-CM

## 2015-05-28 NOTE — Progress Notes (Signed)

## 2015-06-04 DIAGNOSIS — E119 Type 2 diabetes mellitus without complications: Secondary | ICD-10-CM

## 2015-06-04 DIAGNOSIS — E11620 Type 2 diabetes mellitus with diabetic dermatitis: Secondary | ICD-10-CM | POA: Diagnosis not present

## 2015-06-09 NOTE — Progress Notes (Signed)
Patient was seen on 06/03/15 for the third of a series of three diabetes self-management courses at the [facility].   Carlos Phillips the amount of activity recommended for healthy living . Describe activities suitable for individual needs . Identify ways to regularly incorporate activity into daily life . Identify barriers to activity and ways to over come these barriers  Identify diabetes medications being personally used and their primary action for lowering glucose and possible side effects . Describe role of stress on blood glucose and develop strategies to address psychosocial issues . Identify diabetes complications and ways to prevent them  Explain how to manage diabetes during illness . Evaluate success in meeting personal goal . Establish 2-3 goals that they will plan to diligently work on until they return for the  107-month follow-up visit  Goals:   I will count my carb choices at most meals and snacks  I will be active 30 minutes or more 5 times a week  I will take my diabetes medications as scheduled  I will eat less unhealthy fats by eating less carbs  Your patient has identified these potential barriers to change:  Motivation  Your patient has identified their diabetes self-care support plan as  [facility] Support Group  Plan:  Attend Support Group as desired

## 2015-06-10 ENCOUNTER — Encounter: Payer: Self-pay | Admitting: Family Medicine

## 2015-06-12 ENCOUNTER — Other Ambulatory Visit: Payer: Self-pay | Admitting: Physician Assistant

## 2015-06-21 ENCOUNTER — Ambulatory Visit (INDEPENDENT_AMBULATORY_CARE_PROVIDER_SITE_OTHER): Payer: 59 | Admitting: Family Medicine

## 2015-06-21 ENCOUNTER — Telehealth: Payer: Self-pay

## 2015-06-21 VITALS — BP 132/80 | HR 72 | Temp 98.2°F | Resp 16 | Ht 71.0 in | Wt 392.0 lb

## 2015-06-21 DIAGNOSIS — E785 Hyperlipidemia, unspecified: Secondary | ICD-10-CM

## 2015-06-21 DIAGNOSIS — Z1211 Encounter for screening for malignant neoplasm of colon: Secondary | ICD-10-CM | POA: Diagnosis not present

## 2015-06-21 DIAGNOSIS — R945 Abnormal results of liver function studies: Secondary | ICD-10-CM

## 2015-06-21 DIAGNOSIS — N529 Male erectile dysfunction, unspecified: Secondary | ICD-10-CM

## 2015-06-21 DIAGNOSIS — E119 Type 2 diabetes mellitus without complications: Secondary | ICD-10-CM

## 2015-06-21 DIAGNOSIS — I10 Essential (primary) hypertension: Secondary | ICD-10-CM

## 2015-06-21 DIAGNOSIS — I739 Peripheral vascular disease, unspecified: Secondary | ICD-10-CM | POA: Diagnosis not present

## 2015-06-21 DIAGNOSIS — E871 Hypo-osmolality and hyponatremia: Secondary | ICD-10-CM

## 2015-06-21 DIAGNOSIS — R7989 Other specified abnormal findings of blood chemistry: Secondary | ICD-10-CM

## 2015-06-21 DIAGNOSIS — Z125 Encounter for screening for malignant neoplasm of prostate: Secondary | ICD-10-CM | POA: Diagnosis not present

## 2015-06-21 DIAGNOSIS — Z0000 Encounter for general adult medical examination without abnormal findings: Secondary | ICD-10-CM | POA: Diagnosis not present

## 2015-06-21 LAB — COMPLETE METABOLIC PANEL WITH GFR
ALT: 18 U/L (ref 9–46)
AST: 18 U/L (ref 10–35)
Albumin: 3.9 g/dL (ref 3.6–5.1)
Alkaline Phosphatase: 58 U/L (ref 40–115)
BUN: 11 mg/dL (ref 7–25)
CO2: 27 mmol/L (ref 20–31)
Calcium: 9.4 mg/dL (ref 8.6–10.3)
Chloride: 106 mmol/L (ref 98–110)
Creat: 1.18 mg/dL (ref 0.70–1.33)
GFR, Est African American: 83 mL/min (ref >=60)
GFR, Est Non African American: 72 mL/min (ref >=60)
Glucose, Bld: 98 mg/dL (ref 65–99)
Potassium: 4.5 mmol/L (ref 3.5–5.3)
Sodium: 142 mmol/L (ref 135–146)
Total Bilirubin: 0.4 mg/dL (ref 0.2–1.2)
Total Protein: 6.6 g/dL (ref 6.1–8.1)

## 2015-06-21 LAB — LIPID PANEL
Cholesterol: 173 mg/dL (ref 125–200)
HDL: 50 mg/dL (ref >=40)
LDL Cholesterol: 101 mg/dL (ref <130)
Total CHOL/HDL Ratio: 3.5 Ratio (ref <=5.0)
Triglycerides: 109 mg/dL (ref <150)
VLDL: 22 mg/dL (ref <30)

## 2015-06-21 LAB — GLUCOSE, POCT (MANUAL RESULT ENTRY): POC Glucose: 122 mg/dl — AB (ref 70–99)

## 2015-06-21 MED ORDER — LANCET DEVICE MISC: Status: DC

## 2015-06-21 MED ORDER — GLUCOSE BLOOD VI STRP: ORAL_STRIP | Status: DC

## 2015-06-21 MED ORDER — CLOPIDOGREL BISULFATE 75 MG PO TABS: 75.0000 mg | ORAL_TABLET | Freq: Every day | ORAL | Status: DC

## 2015-06-21 MED ORDER — GLUCOCOM BLOOD GLUCOSE MONITOR DEVI: 1.0000 | Freq: Once | Status: DC

## 2015-06-21 MED ORDER — HYDROCHLOROTHIAZIDE 25 MG PO TABS: ORAL_TABLET | ORAL | Status: DC

## 2015-06-21 MED ORDER — ATORVASTATIN CALCIUM 10 MG PO TABS: ORAL_TABLET | ORAL | Status: DC

## 2015-06-21 MED ORDER — SILDENAFIL CITRATE 100 MG PO TABS: 50.0000 mg | ORAL_TABLET | Freq: Every day | ORAL | Status: DC | PRN

## 2015-06-21 MED ORDER — METFORMIN HCL 500 MG PO TABS: 500.0000 mg | ORAL_TABLET | Freq: Every day | ORAL | Status: DC

## 2015-06-21 MED ORDER — METFORMIN HCL 500 MG PO TABS: ORAL_TABLET | ORAL | Status: DC

## 2015-06-21 MED ORDER — AMLODIPINE BESYLATE 10 MG PO TABS: ORAL_TABLET | ORAL | Status: DC

## 2015-06-21 MED ORDER — CLONIDINE HCL 0.2 MG PO TABS: ORAL_TABLET | ORAL | Status: DC

## 2015-06-21 NOTE — Patient Instructions (Addendum)
IF you received an x-ray today, you will receive an invoice from [facility]. Please contact [facility] Radiology at [phone] with questions or concerns regarding your invoice.   IF you received labwork today, you will receive an invoice from [employer]. Please contact [facility] at [phone] with questions or concerns regarding your invoice.   Our billing staff will not be able to assist you with questions regarding bills from these companies.  You will be contacted with the lab results as soon as they are available. The fastest way to get your results is to activate your My Chart account. Instructions are located on the last page of this paperwork. If you have not heard from [place] regarding the results in 2 weeks, please contact this office.    Work on increasing exercise as discussed and diet changes as discussed with nutritionist. Based on current body mass index, if weight is not symmetrically improving in the next 6 months or so, may need to meet with bariatric surgeon to discuss other options for weight loss, and continued evaluation with nutritionist.  Check your blood sugars once every day or 2 and can check different times. I did send a new glucose meter in for you.  I will have the staff check into the eye doctor appointment, but I also referred you to a gastroenterologist for colon cancer screening.  You are a  little too early to recheck the 3 month blood sugar average. Follow-up with me in the next 1-2 months to recheck that hemoglobin A1c. I refilled your medications today.   Keeping you healthy  Get these tests  Blood pressure- Have your blood pressure checked once a year by your healthcare provider.  Normal blood pressure is 120/80  Weight- Have your body mass index (BMI) calculated to screen for obesity.  BMI is a measure of body fat based on height and weight. You can also calculate your own BMI at  ViewBanking.si.  Cholesterol- Have your cholesterol checked every year.  Diabetes- Have your blood sugar checked regularly if you have high blood pressure, high cholesterol, have a family history of diabetes or if you are overweight.  Screening for Colon Cancer- Colonoscopy starting at age 56.  Screening may begin sooner depending on your family history and other health conditions. Follow up colonoscopy as directed by your Gastroenterologist.  Screening for Prostate Cancer- Both blood work (PSA) and a rectal exam help screen for Prostate Cancer.  Screening begins at age 50 with African-American men and at age 72 with Caucasian men.  Screening may begin sooner depending on your family history.  Take these medicines  Aspirin- One aspirin daily can help prevent Heart disease and Stroke.  Flu shot- Every fall.  Tetanus- Every 10 years.  Zostavax- Once after the age of 60 to prevent Shingles.  Pneumonia shot- Once after the age of 38; if you are younger than 27, ask your healthcare provider if you need a Pneumonia shot.  Take these steps  Don't smoke- If you do smoke, talk to your doctor about quitting.  For tips on how to quit, go to www.smokefree.gov or call 1-800-QUIT-NOW.  Be physically active- Exercise 5 days a week for at least 30 minutes.  If you are not already physically active start slow and gradually work up to 30 minutes of moderate physical activity.  Examples of moderate activity include walking briskly, mowing the yard, dancing, swimming, bicycling, etc.  Eat a healthy diet- Eat a variety of healthy  food such as fruits, vegetables, low fat milk, low fat cheese, yogurt, lean meant, poultry, fish, beans, tofu, etc. For more information go to www.thenutritionsource.org  Drink alcohol in moderation- Limit alcohol intake to less than two drinks a day. Never drink and drive.  Dentist- Brush and floss twice daily; visit your dentist twice a year.  Depression- Your  emotional health is as important as your physical health. If you're feeling down, or losing interest in things you would normally enjoy please talk to your healthcare provider.  Eye exam- Visit your eye doctor every year.  Safe sex- If you may be exposed to a sexually transmitted infection, use a condom.  Seat belts- Seat belts can save your life; always wear one.  Smoke/Carbon Monoxide detectors- These detectors need to be installed on the appropriate level of your home.  Replace batteries at least once a year.  Skin cancer- When out in the sun, cover up and use sunscreen 15 SPF or higher.  Violence- If anyone is threatening you, please tell your healthcare provider.  Living Will/ Health care power of attorney- Speak with your healthcare provider and family.

## 2015-06-21 NOTE — Progress Notes (Addendum)
Subjective:    Patient ID: Carlos Phillips, male    DOB: 12/10/1965, 50 y.o.   MRN: [id number] By signing my name below, I, [name], attest that this documentation has been prepared under the direction and in the presence of [name], MD.  Electronically Signed: [name], Medical Scribe. 06/21/2015. 10:30 AM.  HPI HPI Comments: Carlos Phillips is a 50 y.o. male who presents to the [facility] for a compete physical exam. He has been seen by other providers since his last visit with me. Last visit with me in November 2015. Last physical was in March 2016 with [name]. He has a form to complete for his physical with [employer]. Patient is fasting; he last ate at 5:00 PM yesterday.  Cancer screening:  Colon cancer screening - He has not had a colonoscopy yet. He agrees to be referred to GI. Prostate cancer screening - He agrees to be screened for prostate cancer today. Lab Results  Component Value Date   PSA 0.31 06/15/2014   PSA 0.37 08/05/2013   PSA 0.48 05/05/2012    Immunizations: Patient refuses flu shot. He has not had pneumonia vaccination and declines pneumovax today with underlying diabetes. Immunization History  Administered Date(s) Administered  . Tdap 08/05/2013    Depression screening:  Depression screen [facility] 2/9 06/21/2015 06/09/2015 05/21/2015 05/07/2015 04/25/2015  Decreased Interest 0 0 0 0 0  Down, Depressed, Hopeless 0 - 0 0 0  PHQ - 2 Score 0 0 0 0 0   Vision screening: Patient states he was never contacted by optho for appointment.  Visual Acuity Screening   Right eye Left eye Both eyes  Without correction: 20/25 20/25 20/25  With correction:       Dentist: He has not seen a dentist recently.  Exercise: Patient exercises about twice a week, less than 30 minutes at a time. Body mass index is 54.7 kg/(m^2).   Diabetes: New diagnosis January 28th with A1c 6.5. Started metformin 500 mg qd, then to increase to 1000 mg per day.  Referred to dermatology, optho, and diabetic education at January 28th visit. Recommended ADA diet. He did [facility], 3 classes. He is still taking 1 pill of metformin a day. Patient does not have a glucometer at home so he has not been able to check his blood sugar. Wt Readings from Last 3 Encounters:  06/21/15 392 lb (177.81 kg)  05/21/15 393 lb 3.2 oz (178.354 kg)  05/07/15 386 lb 12.8 oz (175.451 kg)    Hypertension: He takes amlodipine 10 mg qd, clonidine 0.2 mg BID, HCTZ 25 mg qd. Borderline hyponatremia, sodium 134 on February 9th. Borderline AST at 46. Lab Results  Component Value Date   CREATININE 1.29 05/07/2015    Peripheral vascular disease: with angioplasty December 2014 by Dr. Oneida Alar. Angioplasty on left popliteal artery. He is on Plavix for peripheral vascular disease. Evaluation in September 2016. Continued on same medications, plan with follow-up in 1 year with ABI's. Patient denies chest pain, lightheadedness, dizziness, and SOB. He quit smoking about 2-3 years ago.  Hyperlipidemia: He had not been taking his medication for 2 months in January. Stated he restarted then. Patient states he has been compliant with the Lipitor. He has not had any problems with the Lipitor. Lab Results  Component Value Date   CHOL 154 06/15/2014   HDL 49 06/15/2014   LDLCALC 79 06/15/2014   TRIG  129 06/15/2014   CHOLHDL 3.1 06/15/2014    Lab Results  Component Value Date   ALT 39 05/07/2015   AST 46* 05/07/2015   ALKPHOS 67 05/07/2015   BILITOT 0.6 05/07/2015    Erectile dysfunction: See previous notes. He was cleared for use of Viagra by his cardiologist. Prescribed 50-100 mg as needed when last seen by me in November 2015. He has been taking Viagra about once a month. Patient denies hearing changes, blue color to his vision, chest pain, chest tightness, dizziness, and lightheadedness.  Patient Active Problem List   Diagnosis Date  Noted  . Type 2 diabetes mellitus with diabetic dermatitis, without long-term current use of insulin ([place]) 05/03/2015  . Embolism ([place]) 03/07/2013  . Ischemia of extremity 02/25/2013  . HTN (hypertension) 05/07/2011  . Erectile dysfunction 05/07/2011  . Obesity 05/07/2011   Past Medical History  Diagnosis Date  . Hypertension   . DVT (deep venous thrombosis) ([place])   . Hyperlipidemia   . Diabetes mellitus without complication [facility])    Past Surgical History  Procedure Laterality Date  . Embolectomy Left 02/25/2013    Procedure: Left Popliteal EMBOLECTOMY Poss fasciotomy;  Surgeon: Elam Dutch, MD;  Location: [facility] OR;  Service: Vascular;  Laterality: Left;  Left poplital and Tibial embolectomy with four compartment Fasciotomy with vein patch angioplasty left popliteal artery.  . Abdominal aortagram N/A 02/28/2013    Procedure: ABDOMINAL Maxcine Ham;  Surgeon: Conrad Cumming, MD;  Location: [facility] CATH LAB;  Service: Cardiovascular;  Laterality: N/A;   Allergies  Allergen Reactions  . Morphine And Related Nausea And Vomiting   Prior to Admission medications   Medication Sig Start Date End Date Taking? Authorizing Provider  amLODipine (NORVASC) 10 MG tablet TAKE ONE TABLET BY MOUTH ONCE DAILY "OV NEEDED FOR REFILLS" 09/24/14  Yes Chelle Jeffery, PA-C  atorvastatin (LIPITOR) 10 MG tablet Take one tablet by mouth once daily. 02/27/15  Yes Fay Records, MD  clobetasol (OLUX) 0.05 % topical foam APPLY TOPICALLY 2 TIMES DAILY 06/20/14  Yes Thao P Le, DO  cloNIDine (CATAPRES) 0.2 MG tablet TAKE ONE TABLET BY MOUTH TWICE DAILY 02/03/14  Yes Wendie Agreste, MD  clopidogrel (PLAVIX) 75 MG tablet Take 1 tablet (75 mg total) by mouth daily with breakfast. 04/14/15  Yes Thao P Le, DO  hydrochlorothiazide (HYDRODIURIL) 25 MG tablet TAKE ONE TABLET BY MOUTH ONCE DAILY. "OV NEEDED FOR REFILLS" 09/24/14  Yes Chelle Jeffery, PA-C  metFORMIN (GLUCOPHAGE) 500 MG tablet Take 1 tab po daily for the next 1 week, then 2  tabs PO daily if tolerate 04/25/15  Yes Thao P Le, DO  Multiple Vitamins-Minerals (MULTIVITAMIN WITH MINERALS) tablet Take 1 tablet by mouth daily.   Yes Historical Provider, MD  ondansetron (ZOFRAN-ODT) 8 MG disintegrating tablet Take 1 tablet (8 mg total) by mouth every 8 (eight) hours as needed for nausea. 05/07/15  Yes Jaynee Eagles, PA-C  sildenafil (VIAGRA) 100 MG tablet Take 0.5-1 tablets (50-100 mg total) by mouth daily as needed for erectile dysfunction. 02/03/14 02/02/15  Wendie Agreste, MD   Social History   Social History  . Marital Status: Married    Spouse Name: N/A  . Number of Children: N/A  . Years of Education: N/A   Occupational History  . Not on file.   Social History Main Topics  . Smoking status: Former Smoker -- 1.00 packs/day    Types: Cigarettes    Quit date: 02/25/2013  . Smokeless tobacco: Never Used  .  Alcohol Use: No  . Drug Use: No  . Sexual Activity: Yes   Other Topics Concern  . Not on file   Social History Narrative     Review of Systems 13 point ROS reviewed on patient health survey. Negative other than listed above or in nursing note. See nursing note.     Objective:   Physical Exam  Filed Vitals:   06/21/15 0957  BP: 132/80  Pulse: 72  Temp: 98.2 F (36.8 C)  TempSrc: Oral  Resp: 16  Height: 5' 11" (1.803 m)  Weight: 392 lb (177.81 kg)  SpO2: 98%    Results for orders placed or performed in visit on 06/21/15  POCT glucose (manual entry)  Result Value Ref Range   POC Glucose 122 (A) 70 - 99 mg/dl        Assessment & Plan:   CORTLAND CREHAN is a 50 y.o. male Annual physical exam  --anticipatory guidance as below in AVS, screening labs above. Health maintenance items as above in HPI discussed/recommended as applicable.   Controlled type 2 diabetes mellitus without complication, without long-term current use of insulin ([place]) - Plan: POCT glucose (manual entry), Microalbumin, urine, glucose blood test strip, Lancet Device MISC,  metFORMIN (GLUCOPHAGE) 500 MG tablet, DISCONTINUED: metFORMIN (GLUCOPHAGE) 500 MG tablet, CANCELED: POCT Glucose (Device for Home Use)  -Continue metformin 500 mg daily, diet changes as recommended by nutrition, and increase exercise/activity for weight loss. Recheck for A1c in the next 1-2 months.  Hyperlipidemia - Plan: Lipid panel, COMPLETE METABOLIC PANEL WITH GFR, atorvastatin (LIPITOR) 10 MG tablet  -Now back on Lipitor, lipid panel, CMP pending.  Essential hypertension - Plan: COMPLETE METABOLIC PANEL WITH GFR, amLODipine (NORVASC) 10 MG tablet, cloNIDine (CATAPRES) 0.2 MG tablet, hydrochlorothiazide (HYDRODIURIL) 25 MG tablet  -Stable, continue same regimen.  Elevated liver function tests - Plan: COMPLETE METABOLIC PANEL WITH GFR  - Single elevated LFT last visit. Repeat CMP.  Hyponatremia - Plan: COMPLETE METABOLIC PANEL WITH GFR  -Borderline previously, repeat CMP.  PVD (peripheral vascular disease) ([place]) - Plan: clopidogrel (PLAVIX) 75 MG tablet  - Asymptomatic. Reviewed last note with vascular. Will continue to work on weight and cholesterol treatment. Risk factor modification. Continue routine follow-up with vascular in the fall, continue Plavix 75 mg daily.  Screen for colon cancer - Plan: Ambulatory referral to Gastroenterology   Screening for prostate cancer - Plan: PSA  -We discussed pros and cons of prostate cancer screening, and after this discussion, he chose to have screening done. PSA obtained, and no concerning findings on DRE.   Morbid obesity, unspecified obesity type ([place])  - BMI of 54. Discussed intensive exercise, nutrition approach for the next 3-6 months, but may benefit from evaluation with bariatric surgeon if not seeing some results given his morbid obesity currently.  Erectile dysfunction, unspecified erectile dysfunction type - Plan: sildenafil (VIAGRA) 100 MG tablet  -viagra Rx given - use lowest effective dose. Side effects discussed (including but not  limited to headache/flushing, blue discoloration of vision, possible vascular steal and risk of cardiac effects if underlying unknown coronary artery disease, and permanent sensorineural hearing loss). Understanding expressed.   Meds ordered this encounter  Medications  . amLODipine (NORVASC) 10 MG tablet    Sig: TAKE ONE TABLET BY MOUTH ONCE DAILY "OV NEEDED FOR REFILLS"    Dispense:  90 tablet    Refill:  0  . atorvastatin (LIPITOR) 10 MG tablet    Sig: Take one tablet by mouth once daily.  Dispense:  90 tablet    Refill:  3  . cloNIDine (CATAPRES) 0.2 MG tablet    Sig: TAKE ONE TABLET BY MOUTH TWICE DAILY    Dispense:  180 tablet    Refill:  1  . hydrochlorothiazide (HYDRODIURIL) 25 MG tablet    Sig: TAKE ONE TABLET BY MOUTH ONCE DAILY. "OV NEEDED FOR REFILLS"    Dispense:  90 tablet    Refill:  0  . clopidogrel (PLAVIX) 75 MG tablet    Sig: Take 1 tablet (75 mg total) by mouth daily with breakfast.    Dispense:  90 tablet    Refill:  1  . DISCONTD: metFORMIN (GLUCOPHAGE) 500 MG tablet    Sig: Take 1 tab po daily for the next 1 week, then 2 tabs PO daily if tolerate    Dispense:  180 tablet    Refill:  0  . sildenafil (VIAGRA) 100 MG tablet    Sig: Take 0.5-1 tablets (50-100 mg total) by mouth daily as needed for erectile dysfunction.    Dispense:  5 tablet    Refill:  5  . glucose blood test strip    Sig: To test blood sugar once per day. Dx 250.00.  Brand per insurance coverage.    Dispense:  100 each    Refill:  12  . Lancet Device MISC    Sig: To test blood sugar once per day. Dx 250.00.  Brand per insurance coverage.    Dispense:  100 each    Refill:  6  . Blood Glucose Monitoring Suppl (GLUCOCOM BLOOD GLUCOSE MONITOR) DEVI    Sig: 1 kit by Does not apply route once.    Dispense:  1 each    Refill:  0    Brand per insurance coverage dx 250.00 to check blood sugar once daily  . metFORMIN (GLUCOPHAGE) 500 MG tablet    Sig: Take 1 tablet (500 mg total) by mouth  daily with breakfast.    Dispense:  90 tablet    Refill:  0   Patient Instructions       IF you received an x-ray today, you will receive an invoice from [facility]. Please contact [facility] at [phone] with questions or concerns regarding your invoice.   IF you received labwork today, you will receive an invoice from [employer]. Please contact [facility] at [phone] with questions or concerns regarding your invoice.   Our billing staff will not be able to assist you with questions regarding bills from these companies.  You will be contacted with the lab results as soon as they are available. The fastest way to get your results is to activate your My Chart account. Instructions are located on the last page of this paperwork. If you have not heard from [place] regarding the results in 2 weeks, please contact this office.    Work on increasing exercise as discussed and diet changes as discussed with nutritionist. Based on current body mass index, if weight is not symmetrically improving in the next 6 months or so, may need to meet with bariatric surgeon to discuss other options for weight loss, and continued evaluation with nutritionist.  Check your blood sugars once every day or 2 and can check different times. I did send a new glucose meter in for you.  I will have the staff check into the eye doctor appointment, but I also referred you to a gastroenterologist for colon cancer screening.  You are a  little too early to recheck the 3 month blood sugar average. Follow-up with me in the next 1-2 months to recheck that hemoglobin A1c. I refilled your medications today.   Keeping you healthy  Get these tests  Blood pressure- Have your blood pressure checked once a year by your healthcare provider.  Normal blood pressure is 120/80  Weight- Have your body mass index (BMI) calculated to screen for obesity.  BMI is a measure of body fat based  on height and weight. You can also calculate your own BMI at ViewBanking[employer]si.  Cholesterol- Have your cholesterol checked every year.  Diabetes- Have your blood sugar checked regularly if you have high blood pressure, high cholesterol, have a family history of diabetes or if you are overweight.  Screening for Colon Cancer- Colonoscopy starting at age 24.  Screening may begin sooner depending on your family history and other health conditions. Follow up colonoscopy as directed by your Gastroenterologist.  Screening for Prostate Cancer- Both blood work (PSA) and a rectal exam help screen for Prostate Cancer.  Screening begins at age 70 with African-American men and at age 79 with Caucasian men.  Screening may begin sooner depending on your family history.  Take these medicines  Aspirin- One aspirin daily can help prevent Heart disease and Stroke.  Flu shot- Every fall.  Tetanus- Every 10 years.  Zostavax- Once after the age of 70 to prevent Shingles.  Pneumonia shot- Once after the age of 48; if you are younger than 8, ask your healthcare provider if you need a Pneumonia shot.  Take these steps  Don't smoke- If you do smoke, talk to your doctor about quitting.  For tips on how to quit, go to www.smokefree.gov or call 1-800-QUIT-NOW.  Be physically active- Exercise 5 days a week for at least 30 minutes.  If you are not already physically active start slow and gradually work up to 30 minutes of moderate physical activity.  Examples of moderate activity include walking briskly, mowing the yard, dancing, swimming, bicycling, etc.  Eat a healthy diet- Eat a variety of healthy food such as fruits, vegetables, low fat milk, low fat cheese, yogurt, lean meant, poultry, fish, beans, tofu, etc. For more information go to www.thenutritionsource.org  Drink alcohol in moderation- Limit alcohol intake to less than two drinks a day. Never drink and drive.  Dentist- Brush and floss twice  daily; visit your dentist twice a year.  Depression- Your emotional health is as important as your physical health. If you're feeling down, or losing interest in things you would normally enjoy please talk to your healthcare provider.  Eye exam- Visit your eye doctor every year.  Safe sex- If you may be exposed to a sexually transmitted infection, use a condom.  Seat belts- Seat belts can save your life; always wear one.  Smoke/Carbon Monoxide detectors- These detectors need to be installed on the appropriate level of your home.  Replace batteries at least once a year.  Skin cancer- When out in the sun, cover up and use sunscreen 15 SPF or higher.  Violence- If anyone is threatening you, please tell your healthcare provider.  Living Will/ Health care power of attorney- Speak with your healthcare provider and family.      I personally performed the services described in this documentation, which was scribed in my presence. The recorded information has been reviewed and considered, and addended by me as needed.

## 2015-06-21 NOTE — Telephone Encounter (Signed)
-----   Message from Wendie Agreste, MD sent at 06/21/2015 11:26 AM EDT ----- Please check into status of optho referral - pt states he has not heard from optho from 03/2015 referral. Thx. -JG

## 2015-06-22 ENCOUNTER — Telehealth: Payer: Self-pay | Admitting: *Deleted

## 2015-06-22 LAB — PSA: PSA: 0.34 ng/mL (ref <=4.00)

## 2015-06-22 NOTE — Telephone Encounter (Signed)
I have refaxed the referral to [facility] [place].

## 2015-06-22 NOTE — Telephone Encounter (Signed)
Biometric screening form filled out and faxed. Confirmation received.  Lmom that paper is ready for pickup so he can come by to pickup.

## 2015-06-23 LAB — MICROALBUMIN, URINE: Microalb, Ur: 0.3 mg/dL

## 2015-06-24 ENCOUNTER — Encounter: Payer: Self-pay | Admitting: Gastroenterology

## 2015-07-17 ENCOUNTER — Encounter: Payer: Self-pay | Admitting: Gastroenterology

## 2015-07-22 ENCOUNTER — Ambulatory Visit: Payer: 59 | Admitting: Family Medicine

## 2015-08-05 ENCOUNTER — Telehealth: Payer: Self-pay

## 2015-08-05 ENCOUNTER — Encounter (INDEPENDENT_AMBULATORY_CARE_PROVIDER_SITE_OTHER): Payer: 59 | Admitting: Ophthalmology

## 2015-08-05 DIAGNOSIS — H2513 Age-related nuclear cataract, bilateral: Secondary | ICD-10-CM | POA: Diagnosis not present

## 2015-08-05 DIAGNOSIS — H43813 Vitreous degeneration, bilateral: Secondary | ICD-10-CM | POA: Diagnosis not present

## 2015-08-05 DIAGNOSIS — H35033 Hypertensive retinopathy, bilateral: Secondary | ICD-10-CM | POA: Diagnosis not present

## 2015-08-05 DIAGNOSIS — I10 Essential (primary) hypertension: Secondary | ICD-10-CM | POA: Diagnosis not present

## 2015-08-05 LAB — HM DIABETES EYE EXAM

## 2015-08-05 NOTE — Telephone Encounter (Signed)
Patient needs all his meds refilled at the [facility] [address] [place], [place] - [address] he stated that the pharmacy wont refill it because they say it is out of date. Not sure about this can someone check this.  His call back number is [phone]

## 2015-08-10 ENCOUNTER — Other Ambulatory Visit: Payer: Self-pay | Admitting: Family Medicine

## 2015-08-10 ENCOUNTER — Telehealth: Payer: Self-pay

## 2015-08-10 NOTE — Telephone Encounter (Signed)
Pt's wife called regarding prescription refills. Pt is saying [facility] will not refill prescriptions. I advised pt we sent in 3 month supply in March so pt should have 1 month left of refills. Pt's wife is going to call [facility] to discuss refills. Advised pt's wife to call back if any problems and we can resend prescriptions.

## 2015-08-18 ENCOUNTER — Ambulatory Visit: Payer: 59 | Admitting: Gastroenterology

## 2015-09-16 ENCOUNTER — Ambulatory Visit: Payer: 59 | Admitting: Family Medicine

## 2015-09-17 ENCOUNTER — Ambulatory Visit: Payer: 59 | Admitting: Family Medicine

## 2015-09-22 ENCOUNTER — Encounter: Payer: Self-pay | Admitting: Family Medicine

## 2015-09-24 ENCOUNTER — Encounter: Payer: Self-pay | Admitting: Family Medicine

## 2015-09-24 ENCOUNTER — Ambulatory Visit (INDEPENDENT_AMBULATORY_CARE_PROVIDER_SITE_OTHER): Payer: 59 | Admitting: Family Medicine

## 2015-09-24 VITALS — BP 138/88 | HR 107 | Temp 98.2°F | Resp 16 | Ht 72.0 in | Wt 385.2 lb

## 2015-09-24 DIAGNOSIS — E669 Obesity, unspecified: Secondary | ICD-10-CM

## 2015-09-24 DIAGNOSIS — E785 Hyperlipidemia, unspecified: Secondary | ICD-10-CM | POA: Diagnosis not present

## 2015-09-24 DIAGNOSIS — I739 Peripheral vascular disease, unspecified: Secondary | ICD-10-CM

## 2015-09-24 DIAGNOSIS — N529 Male erectile dysfunction, unspecified: Secondary | ICD-10-CM | POA: Diagnosis not present

## 2015-09-24 DIAGNOSIS — I10 Essential (primary) hypertension: Secondary | ICD-10-CM | POA: Diagnosis not present

## 2015-09-24 DIAGNOSIS — E119 Type 2 diabetes mellitus without complications: Secondary | ICD-10-CM

## 2015-09-24 LAB — GLUCOSE, POCT (MANUAL RESULT ENTRY): POC Glucose: 122 mg/dl — AB (ref 70–99)

## 2015-09-24 LAB — POCT GLYCOSYLATED HEMOGLOBIN (HGB A1C): Hemoglobin A1C: 6.3

## 2015-09-24 MED ORDER — SILDENAFIL CITRATE 100 MG PO TABS: 50.0000 mg | ORAL_TABLET | Freq: Every day | ORAL | Status: DC | PRN

## 2015-09-24 MED ORDER — AMLODIPINE BESYLATE 10 MG PO TABS: ORAL_TABLET | ORAL | Status: DC

## 2015-09-24 MED ORDER — ATORVASTATIN CALCIUM 10 MG PO TABS: ORAL_TABLET | ORAL | Status: DC

## 2015-09-24 MED ORDER — HYDROCHLOROTHIAZIDE 25 MG PO TABS: ORAL_TABLET | ORAL | Status: DC

## 2015-09-24 MED ORDER — CLONIDINE HCL 0.1 MG PO TABS: ORAL_TABLET | ORAL | Status: DC

## 2015-09-24 MED ORDER — CLOPIDOGREL BISULFATE 75 MG PO TABS: 75.0000 mg | ORAL_TABLET | Freq: Every day | ORAL | Status: DC

## 2015-09-24 MED ORDER — METFORMIN HCL 500 MG PO TABS: 500.0000 mg | ORAL_TABLET | Freq: Every day | ORAL | Status: DC

## 2015-09-24 NOTE — Patient Instructions (Addendum)
     IF you received an x-ray today, you will receive an invoice from [facility]. Please contact [facility] Radiology at [phone] with questions or concerns regarding your invoice.   IF you received labwork today, you will receive an invoice from [employer]. Please contact [facility] at [phone] with questions or concerns regarding your invoice.   Our billing staff will not be able to assist you with questions regarding bills from these companies.  You will be contacted with the lab results as soon as they are available. The fastest way to get your results is to activate your My Chart account. Instructions are located on the last page of this paperwork. If you have not heard from [place] regarding the results in 2 weeks, please contact this office.    Good job on the weight loss. Keep up the good work.  Increase exercise to activity most if not all days during the week, and continue to watch diet.  Decrease clonidine to 0.1mg  twice per day. If still feeling tired with that dose - stop taking it, but monitor blood pressure closely (at least once per day) and if remains over 140/90 - return to discuss medication changes. Goal for your blood pressure is 130/80 or below.  Call dermatologist if other ointment needs refilled, but can apply Eucerin or similar store brand to dry skin.  Recheck in 3 months.

## 2015-09-24 NOTE — Progress Notes (Signed)
Subjective:  By signing my name below, I, Moises Blood, attest that this documentation has been prepared under the direction and in the presence of [name], MD. Electronically Signed: Moises Blood, [employer]. 09/24/2015 , 5:24 PM .  Patient was seen in Room 25 .   Patient ID: Carlos Phillips, male    DOB: [birth year], 50 y.o.   MRN: [id number] Chief Complaint  Patient presents with  . Follow-up    diabetes  . Medication Refill    ALL RXs - see medication list with RF    HPI Carlos Phillips is a 50 y.o. male Here for follow up on diabetes and medication refill. His last meal was around 12:00~12:30PM today.   HTN He is taking clonidine, norvasc and HCTZ. He denies checking his BP at home.  He states only taking clonidine once a day at night because it makes him feel sleepy.   Lab Results  Component Value Date   CREATININE 1.18 06/21/2015    DM type 2 Lab Results  Component Value Date   HGBA1C 6.5 04/25/2015   Lab Results  Component Value Date   MICROALBUR 0.3 06/21/2015   He was recommended to continue exercise and activity for weight loss. He was also recommended nutrition changes at his visit in March. He is on metformin 544m qd. He denies any complications with metformin. He denies missing any doses of his medication.   Wt Readings from Last 3 Encounters:  09/24/15 385 lb 3.2 oz (174.726 kg)  06/21/15 392 lb (177.81 kg)  05/21/15 393 lb 3.2 oz (178.354 kg)   Exercise He's currently exercising about twice a week.   Diet He's improving his diet and cutting out some foods.   Peripheral vascular disease with angioplasty on left popliteal artery He is followed by Dr. FOneida Alar He is on plavix. Plan on follow up ABI's this summer. He is no longer smoking.   HLD Lab Results  Component Value Date   CHOL 173 06/21/2015   HDL 50 06/21/2015   LDLCALC 101 06/21/2015   TRIG 109 06/21/2015   CHOLHDL 3.5 06/21/2015   Lab Results  Component Value Date   ALT  18 06/21/2015   AST 18 06/21/2015   ALKPHOS 58 06/21/2015   BILITOT 0.4 06/21/2015    Erectile dysfunction He used viagra in the past without difficulty.   He still takes viagra half pill as needed. He denies chest pain, hearing loss, or dizziness when taking it.   Obesity See weight change above, discuss bariatric evaluation if difficulty with weight loss at last visit.   Patient Active Problem List   Diagnosis Date Noted  . Type 2 diabetes mellitus with diabetic dermatitis, without long-term current use of insulin ([place] 05/03/2015  . Embolism ([place] 03/07/2013  . Ischemia of extremity 02/25/2013  . HTN (hypertension) 05/07/2011  . Erectile dysfunction 05/07/2011  . Obesity 05/07/2011   Past Medical History  Diagnosis Date  . Hypertension   . D[place]) ([place]   . Hyperlipidemia   . Diabetes mellitus without complication ([facility]    Past Surgical History  Procedure Laterality Date  . Embolectomy Left 02/25/2013    Procedure: Left Popliteal EMBOLECTOMY Poss fasciotomy;  Surgeon: CElam Dutch MD;  [facility];  Service: Vascular;  Laterality: Left;  Left poplital and Tibial embolectomy with four compartment Fasciotomy with vein patch angioplasty left popliteal artery.  . Abdominal aortagram N/A 02/28/2013    Procedure: ABDOMINAL AMaxcine Ham  Surgeon: BJannette Fogo  Bridgett Larsson, MD;  Location: [facility] CATH LAB;  Service: Cardiovascular;  Laterality: N/A;   Allergies  Allergen Reactions  . Morphine And Related Nausea And Vomiting   Prior to Admission medications   Medication Sig Start Date End Date Taking? Authorizing Provider  amLODipine (NORVASC) 10 MG tablet TAKE ONE TABLET BY MOUTH ONCE DAILY "OV NEEDED FOR REFILLS" 06/21/15   Wendie Agreste, MD  atorvastatin (LIPITOR) 10 MG tablet Take one tablet by mouth once daily. 06/21/15   Wendie Agreste, MD  Blood Glucose Monitoring Suppl (GLUCOCOM BLOOD GLUCOSE MONITOR) DEVI 1 kit by Does not apply route once. 06/21/15   Wendie Agreste, MD  clobetasol (OLUX) 0.05 % topical foam APPLY TOPICALLY 2 TIMES DAILY 06/20/14   Thao P Le, DO  cloNIDine (CATAPRES) 0.2 MG tablet TAKE ONE TABLET BY MOUTH TWICE DAILY 06/21/15   Wendie Agreste, MD  clopidogrel (PLAVIX) 75 MG tablet Take 1 tablet (75 mg total) by mouth daily with breakfast. 06/21/15   Wendie Agreste, MD  glucose blood test strip To test blood sugar once per day. Dx 250.00.  Brand per insurance coverage. 06/21/15   Wendie Agreste, MD  hydrochlorothiazide (HYDRODIURIL) 25 MG tablet TAKE ONE TABLET BY MOUTH ONCE DAILY. "OV NEEDED FOR REFILLS" 06/21/15   Wendie Agreste, MD  Lancet Device MISC To test blood sugar once per day. Dx 250.00.  Brand per insurance coverage. 06/21/15   Wendie Agreste, MD  metFORMIN (GLUCOPHAGE) 500 MG tablet Take 1 tablet (500 mg total) by mouth daily with breakfast. 06/21/15   Wendie Agreste, MD  Multiple Vitamins-Minerals (MULTIVITAMIN WITH MINERALS) tablet Take 1 tablet by mouth daily.    Historical Provider, MD  sildenafil (VIAGRA) 100 MG tablet Take 0.5-1 tablets (50-100 mg total) by mouth daily as needed for erectile dysfunction. 06/21/15 06/19/16  Wendie Agreste, MD   Social History   Social History  . Marital Status: Married    Spouse Name: N/A  . Number of Children: N/A  . Years of Education: N/A   Occupational History  . Not on file.   Social History Main Topics  . Smoking status: Former Smoker -- 1.00 packs/day    Types: Cigarettes    Quit date: 02/25/2013  . Smokeless tobacco: Never Used  . Alcohol Use: No  . Drug Use: No  . Sexual Activity: Yes   Other Topics Concern  . Not on file   Social History Narrative   Review of Systems  Constitutional: Negative for fatigue and unexpected weight change.  HENT: Negative for hearing loss.   Eyes: Negative for visual disturbance.  Respiratory: Negative for cough, chest tightness and shortness of breath.   Cardiovascular: Negative for chest pain, palpitations and leg  swelling.  Gastrointestinal: Negative for abdominal pain and blood in stool.  Neurological: Negative for dizziness, light-headedness and headaches.       Objective:   Physical Exam  Constitutional: He is oriented to person, place, and time. He appears well-developed and well-nourished.  HENT:  Head: Normocephalic and atraumatic.  Eyes: EOM are normal. Pupils are equal, round, and reactive to light.  Neck: No JVD present. Carotid bruit is not present.  Cardiovascular: Normal rate, regular rhythm and normal heart sounds.   No murmur heard. Pulmonary/Chest: Effort normal and breath sounds normal. He has no rales.  Musculoskeletal:  Trace pedal edema in lower extremities, NVI distally  Neurological: He is alert and oriented to person, place, and time.  Skin: Skin  is warm and dry.  Hyperkeratotic skin with some hyperpigmentation on the left medial lower leg; dry skin over lower extremities  Psychiatric: He has a normal mood and affect.  Vitals reviewed.   Filed Vitals:   09/24/15 1624  BP: 138/88  Pulse: 107  Temp: 98.2 F (36.8 C)  TempSrc: Oral  Resp: 16  Height: 6' (1.829 m)  Weight: 385 lb 3.2 oz (174.726 kg)  SpO2: 97%   Results for orders placed or performed in visit on 09/24/15  POCT glycosylated hemoglobin (Hb A1C)  Result Value Ref Range   Hemoglobin A1C 6.3   POCT glucose (manual entry)  Result Value Ref Range   POC Glucose 122 (A) 70 - 99 mg/dl      Assessment & Plan:    Carlos Phillips is a 50 y.o. male Controlled type 2 diabetes mellitus without complication, without long-term current use of insulin ([place]) - Plan: POCT glycosylated hemoglobin (Hb A1C), POCT glucose (manual entry), metFORMIN (GLUCOPHAGE) 500 MG tablet  - improving control. Commended on weight loss efforts.   -refilled metformin at same dose, but as weight decreases may be able to try diet control.   Erectile dysfunction, unspecified erectile dysfunction type - Plan: sildenafil (VIAGRA)  100 MG tablet  -viagra Rx given - use lowest effective dose. Side effects discussed (including but not limited to headache/flushing, blue discoloration of vision, possible vascular steal and risk of cardiac effects if underlying unknown coronary artery disease, and permanent sensorineural hearing loss). Understanding expressed.  Essential hypertension - Plan: hydrochlorothiazide (HYDRODIURIL) 25 MG tablet, cloNIDine (CATAPRES) 0.1 MG tablet, amLODipine (NORVASC) 10 MG tablet  -borderline, but stable. Fatigue with 0.53m dose of clonidine and only taking at night.   -will try 0.157mBID, but if still fatigued at this dose, can hold clonidine, and monitor readings off meds. Continue other meds and rtc precautions discussed.   PVD (peripheral vascular disease) ([place]- Plan: clopidogrel (PLAVIX) 75 MG tablet  -continue Plavix, cont follow up with ABI's as planned this fall.   Hyperlipidemia - Plan: atorvastatin (LIPITOR) 10 MG tablet  - recent lipids ok. Tolerating Lipitor, continue same dose.  Obesity  - commended on weight loss. Increase activity, cont to work on diet and recheck in 3 months. Would still continue to consider bariatric procedure if difficulty with significant weight loss.  Recheck in 3 months.   Meds ordered this encounter  Medications  . sildenafil (VIAGRA) 100 MG tablet    Sig: Take 0.5-1 tablets (50-100 mg total) by mouth daily as needed for erectile dysfunction.    Dispense:  5 tablet    Refill:  5  . metFORMIN (GLUCOPHAGE) 500 MG tablet    Sig: Take 1 tablet (500 mg total) by mouth daily with breakfast.    Dispense:  30 tablet    Refill:  5  . hydrochlorothiazide (HYDRODIURIL) 25 MG tablet    Sig: TAKE ONE TABLET BY MOUTH ONCE DAILY.    Dispense:  30 tablet    Refill:  5  . clopidogrel (PLAVIX) 75 MG tablet    Sig: Take 1 tablet (75 mg total) by mouth daily with breakfast.    Dispense:  30 tablet    Refill:  5  . cloNIDine (CATAPRES) 0.1 MG tablet    Sig: TAKE ONE  TABLET BY MOUTH TWICE DAILY    Dispense:  60 tablet    Refill:  5  . atorvastatin (LIPITOR) 10 MG tablet    Sig: Take one tablet by  mouth once daily.    Dispense:  30 tablet    Refill:  5  . amLODipine (NORVASC) 10 MG tablet    Sig: TAKE ONE TABLET BY MOUTH ONCE DAILY    Dispense:  30 tablet    Refill:  5   Patient Instructions       IF you received an x-ray today, you will receive an invoice from [facility]. Please contact [facility] at [phone] with questions or concerns regarding your invoice.   IF you received labwork today, you will receive an invoice from [employer]. Please contact [facility] at [phone] with questions or concerns regarding your invoice.   Our billing staff will not be able to assist you with questions regarding bills from these companies.  You will be contacted with the lab results as soon as they are available. The fastest way to get your results is to activate your My Chart account. Instructions are located on the last page of this paperwork. If you have not heard from [place] regarding the results in 2 weeks, please contact this office.    Good job on the weight loss. Keep up the good work.  Increase exercise to activity most if not all days during the week, and continue to watch diet.  Decrease clonidine to 0.47m twice per day. If still feeling tired with that dose - stop taking it, but monitor blood pressure closely (at least once per day) and if remains over 140/90 - return to discuss medication changes. Goal for your blood pressure is 130/80 or below.  Call dermatologist if other ointment needs refilled, but can apply Eucerin or similar store brand to dry skin.  Recheck in 3 months.     I personally performed the services described in this documentation, which was scribed in my presence. The recorded information has been reviewed and considered, and addended by me as needed.   Signed,   JMerri Ray MD [facility].  09/24/2015 5:34 PM

## 2015-10-16 ENCOUNTER — Encounter: Payer: Self-pay | Admitting: *Deleted

## 2015-10-21 ENCOUNTER — Encounter: Payer: Self-pay | Admitting: Gastroenterology

## 2015-10-21 ENCOUNTER — Ambulatory Visit (INDEPENDENT_AMBULATORY_CARE_PROVIDER_SITE_OTHER): Payer: 59 | Admitting: Gastroenterology

## 2015-10-21 ENCOUNTER — Telehealth: Payer: Self-pay | Admitting: *Deleted

## 2015-10-21 VITALS — BP 110/70 | HR 86 | Ht 72.0 in | Wt 389.0 lb

## 2015-10-21 DIAGNOSIS — Z1211 Encounter for screening for malignant neoplasm of colon: Secondary | ICD-10-CM | POA: Diagnosis not present

## 2015-10-21 DIAGNOSIS — Z7901 Long term (current) use of anticoagulants: Secondary | ICD-10-CM

## 2015-10-21 MED ORDER — NA SULFATE-K SULFATE-MG SULF 17.5-3.13-1.6 GM/177ML PO SOLN: ORAL | 0 refills | Status: DC

## 2015-10-21 NOTE — Patient Instructions (Addendum)
You have been scheduled for a colonoscopy. Please follow written instructions given to you at your visit today.  Please pick up your prep supplies at the pharmacy within the next 1-3 days. If you use inhalers (even only as needed), please bring them with you on the day of your procedure. Your physician has requested that you go to www.startemmi.com and enter the access code given to you at your visit today. This web site gives a general overview about your procedure. However, you should still follow specific instructions given to you by our office regarding your preparation for the procedure.  You will be contacted by our office prior to your procedure for directions on holding your Plavix.  If you do not hear from our office 1 week prior to your scheduled procedure, please call [phone] to discuss.   If you are age 46 or older, your body mass index should be between 23-30. Your Body mass index is 52.76 kg/m. If this is out of the aforementioned range listed, please consider follow up with your Primary Care Provider.  If you are age 11 or younger, your body mass index should be between 19-25. Your Body mass index is 52.76 kg/m. If this is out of the aformentioned range listed, please consider follow up with your Primary Care Provider.

## 2015-10-21 NOTE — Telephone Encounter (Signed)
10/21/2015  RE: JERYL GALAZ DOB: 1965-04-01 MRN: [id number]  Dear Dr Oneida Alar,   We have scheduled the above patient for a colonoscopy procedure. Our records show that he is on anticoagulation therapy.   Please advise as to whether the patient may come off his therapy of Plavix 5 days prior to the procedure, which is scheduled for 12/25/15.  Please route your response to Dixon Boos, CMA.   Sincerely,  Dixon Boos

## 2015-10-21 NOTE — Progress Notes (Signed)
Subjective:    Patient ID: Carlos Phillips, male    DOB: 1965-12-23, 50 y.o.   MRN: [id number]  HPI Carlos Phillips is a pleasant 50 year old African-American male new to GI today referred by Dr. [name] for colon neoplasia screening. Patient has no current GI symptoms, and family history is negative for colon cancer as far as he is aware. He has not had any prior colon evaluation. Patient does have history of morbid obesity with BMI of 52 today, adult-onset diabetes mellitus on oral agents, hypertension, and peripheral vascular disease. He underwent a left  popliteal/tibial embolectomy and then a left external iliac stent in 2014 per Dr. Oneida Alar and has been on Plavix since.  Review of Systems Pertinent positive and negative review of systems were noted in the above HPI section.  All other review of systems was otherwise negative.  Outpatient Encounter Prescriptions as of 10/21/2015  Medication Sig  . amLODipine (NORVASC) 10 MG tablet TAKE ONE TABLET BY MOUTH ONCE DAILY  . atorvastatin (LIPITOR) 10 MG tablet Take one tablet by mouth once daily.  . Blood Glucose Monitoring Suppl (GLUCOCOM BLOOD GLUCOSE MONITOR) DEVI 1 kit by Does not apply route once.  . cloNIDine (CATAPRES) 0.1 MG tablet TAKE ONE TABLET BY MOUTH TWICE DAILY  . clopidogrel (PLAVIX) 75 MG tablet Take 1 tablet (75 mg total) by mouth daily with breakfast.  . glucose blood test strip To test blood sugar once per day. Dx 250.00.  Brand per insurance coverage.  . hydrochlorothiazide (HYDRODIURIL) 25 MG tablet TAKE ONE TABLET BY MOUTH ONCE DAILY.  Marland Kitchen Lancet Device MISC To test blood sugar once per day. Dx 250.00.  Brand per insurance coverage.  . metFORMIN (GLUCOPHAGE) 500 MG tablet Take 1 tablet (500 mg total) by mouth daily with breakfast.  . Multiple Vitamins-Minerals (MULTIVITAMIN WITH MINERALS) tablet Take 1 tablet by mouth daily.  . sildenafil (VIAGRA) 100 MG tablet Take 0.5-1 tablets (50-100 mg total) by mouth daily as needed  for erectile dysfunction.  . [DISCONTINUED] clobetasol (OLUX) 0.05 % topical foam APPLY TOPICALLY 2 TIMES DAILY   Facility-Administered Encounter Medications as of 10/21/2015  Medication  . Tdap (BOOSTRIX) injection 0.5 mL   Allergies  Allergen Reactions  . Morphine And Related Nausea And Vomiting   Patient Active Problem List   Diagnosis Date Noted  . Type 2 diabetes mellitus with diabetic dermatitis, without long-term current use of insulin ([place]) 05/03/2015  . Embolism ([place]) 03/07/2013  . Ischemia of extremity 02/25/2013  . HTN (hypertension) 05/07/2011  . Erectile dysfunction 05/07/2011  . Obesity 05/07/2011   Social History   Social History  . Marital status: Married    Spouse name: Ivin Booty  . Number of children: 2  . Years of education: N/A   Occupational History  . Truck driver    Social History Main Topics  . Smoking status: Former Smoker    Packs/day: 1.00    Years: 30.00    Types: Cigarettes    Quit date: 02/25/2013  . Smokeless tobacco: Never Used  . Alcohol use No     Comment: social  . Drug use: No  . Sexual activity: Yes    Partners: Female   Other Topics Concern  . Not on file   Social History Narrative  . No narrative on file    Carlos Phillips's family history includes Asthma in his son; Diabetes in his mother; Heart disease in his father.      Objective:    Vitals:  10/21/15 1530  BP: 110/70  Pulse: 86    Physical Exam  well-developed morbidly obese African-American male in no acute distress, very pleasant blood pressure 110/70 pulse 86, height 6 foot weight 389, BMI 52.7. HEENT; nontraumatic, cephalic EOMI PERRLA sclera anicteric, Cardia vascular; regular rate and rhythm with S1-S2 no murmur or gallop, Pulmonary ;clear bilaterally, Abdomen; large soft nontender nondistended bowel sounds are active, Rectal; exam not done, Neuropsych ;mood and affect appropriate       Assessment & Plan:   #3  50 year old African-American male referred  for screening colonoscopy. Patient is asymptomatic and of average risk. #2 chronic antiplatelet therapy-on Plavix #3 morbid obesity-BMI 97 #4 hypertension #5 adult-onset diabetes mellitus #6 history of peripheral vascular disease with previous embolectomy and left external iliac stenting 2014 (Dr. Oneida Alar)  Plan; Colonoscopy was reviewed in detail with patient. Patient will be scheduled for colonoscopy at [facility] due to BMI greater than 50. Procedure was discussed in detail including risks and  benefits and he is agreeable to proceed. We will communicate with patient's vascular surgeon Dr. Oneida Alar to assure that holding Plavix for 5-7 days prior to colonoscopy is acceptable for this patient.  History and exam documented by PA [facility] although I also evaluated the patient.  [place] Cellar, MD [facility] Pager [phone]  Cc: Wendie Agreste, MD

## 2015-10-23 NOTE — Telephone Encounter (Signed)
-----   Message ----- From: Elam Dutch, MD Sent: 10/23/2015  12:57 PM To: Larina Bras, CMA  Ok to stop plavix.  Please restart post procedure  Ruta Hinds, MD [facility] Office: [phone] Pager: [phone]

## 2015-10-23 NOTE — Telephone Encounter (Signed)
Patient has been advised that per Dr Oneida Alar, he may hold Plavix 5 days prior to his procedure and restart after procedure. Patient verbalizes understanding.

## 2015-12-18 ENCOUNTER — Encounter: Payer: Self-pay | Admitting: Family

## 2015-12-21 ENCOUNTER — Encounter (HOSPITAL_COMMUNITY): Payer: Self-pay | Admitting: *Deleted

## 2015-12-24 ENCOUNTER — Telehealth: Payer: Self-pay | Admitting: Gastroenterology

## 2015-12-24 ENCOUNTER — Ambulatory Visit: Payer: 59 | Admitting: Family

## 2015-12-24 ENCOUNTER — Ambulatory Visit (HOSPITAL_COMMUNITY): Payer: 59

## 2015-12-24 NOTE — Progress Notes (Signed)
Pt wife called does not know what time to start bowel prep, wife given dr arm bruster's phone number and call dr armbruster's office for instructions.

## 2015-12-24 NOTE — Telephone Encounter (Signed)
Spoke to patient's wife, he had lost his prep instructions for his colonoscopy tomorrow, 9/29. Patient has been on clear liquids today, and drank his first bottle of suprep at 1:00 p.m.  Instructed to keep him on clear liquids, she will come pick up the instructions. Did go over instructions verbally over the phone too. Understands that his next dose will be at 4:45 a.m on 9/29.

## 2015-12-24 NOTE — Telephone Encounter (Signed)
Thanks for the update and instruction for this patient

## 2015-12-25 ENCOUNTER — Encounter (HOSPITAL_COMMUNITY)
Admission: RE | Disposition: A | Discharge status: Home / Self Care | Payer: Self-pay | Source: Ambulatory Visit | Attending: Gastroenterology

## 2015-12-25 ENCOUNTER — Ambulatory Visit (HOSPITAL_COMMUNITY): Payer: 59 | Admitting: Anesthesiology

## 2015-12-25 ENCOUNTER — Ambulatory Visit (HOSPITAL_COMMUNITY): Admit: 2015-12-25 | Payer: Self-pay | Admitting: Gastroenterology

## 2015-12-25 ENCOUNTER — Ambulatory Visit (HOSPITAL_COMMUNITY)
Admission: RE | Admit: 2015-12-25 | Discharge: 2015-12-25 | Disposition: A | Discharge status: Home / Self Care | Payer: 59 | Source: Ambulatory Visit | Attending: Gastroenterology | Admitting: Gastroenterology

## 2015-12-25 ENCOUNTER — Encounter (HOSPITAL_COMMUNITY): Payer: Self-pay

## 2015-12-25 DIAGNOSIS — K5730 Diverticulosis of large intestine without perforation or abscess without bleeding: Secondary | ICD-10-CM | POA: Diagnosis not present

## 2015-12-25 DIAGNOSIS — Z7902 Long term (current) use of antithrombotics/antiplatelets: Secondary | ICD-10-CM | POA: Insufficient documentation

## 2015-12-25 DIAGNOSIS — D122 Benign neoplasm of ascending colon: Secondary | ICD-10-CM | POA: Insufficient documentation

## 2015-12-25 DIAGNOSIS — Z79899 Other long term (current) drug therapy: Secondary | ICD-10-CM | POA: Diagnosis not present

## 2015-12-25 DIAGNOSIS — D123 Benign neoplasm of transverse colon: Secondary | ICD-10-CM | POA: Diagnosis not present

## 2015-12-25 DIAGNOSIS — I10 Essential (primary) hypertension: Secondary | ICD-10-CM | POA: Diagnosis not present

## 2015-12-25 DIAGNOSIS — E785 Hyperlipidemia, unspecified: Secondary | ICD-10-CM | POA: Diagnosis not present

## 2015-12-25 DIAGNOSIS — Z1212 Encounter for screening for malignant neoplasm of rectum: Secondary | ICD-10-CM

## 2015-12-25 DIAGNOSIS — Z87891 Personal history of nicotine dependence: Secondary | ICD-10-CM | POA: Diagnosis not present

## 2015-12-25 DIAGNOSIS — Z6841 Body Mass Index (BMI) 40.0 and over, adult: Secondary | ICD-10-CM | POA: Insufficient documentation

## 2015-12-25 DIAGNOSIS — Z1211 Encounter for screening for malignant neoplasm of colon: Secondary | ICD-10-CM

## 2015-12-25 DIAGNOSIS — I739 Peripheral vascular disease, unspecified: Secondary | ICD-10-CM | POA: Diagnosis not present

## 2015-12-25 DIAGNOSIS — D124 Benign neoplasm of descending colon: Secondary | ICD-10-CM | POA: Insufficient documentation

## 2015-12-25 DIAGNOSIS — E119 Type 2 diabetes mellitus without complications: Secondary | ICD-10-CM | POA: Diagnosis not present

## 2015-12-25 HISTORY — PX: COLONOSCOPY WITH PROPOFOL: SHX5780

## 2015-12-25 LAB — GLUCOSE, CAPILLARY: Glucose-Capillary: 105 mg/dL — ABNORMAL HIGH (ref 65–99)

## 2015-12-25 SURGERY — COLONOSCOPY WITH PROPOFOL
Anesthesia: Monitor Anesthesia Care

## 2015-12-25 MED ORDER — LACTATED RINGERS IV SOLN
INTRAVENOUS | Status: DC
  Administered 2015-12-25: 1000 mL via INTRAVENOUS

## 2015-12-25 MED ORDER — PROPOFOL 10 MG/ML IV BOLUS
INTRAVENOUS | Status: DC | PRN
  Administered 2015-12-25: 20 mg via INTRAVENOUS
  Administered 2015-12-25: 50 mg via INTRAVENOUS
  Administered 2015-12-25 (×2): 20 mg via INTRAVENOUS

## 2015-12-25 MED ORDER — PROPOFOL 10 MG/ML IV BOLUS
INTRAVENOUS | Status: AC
Start: 2015-12-25 — End: 2015-12-25
  Filled 2015-12-25: qty 40

## 2015-12-25 MED ORDER — SODIUM CHLORIDE 0.9 % IV SOLN: INTRAVENOUS | Status: DC

## 2015-12-25 MED ORDER — PROPOFOL 10 MG/ML IV BOLUS
INTRAVENOUS | Status: AC
  Filled 2015-12-25: qty 20

## 2015-12-25 MED ORDER — PROPOFOL 500 MG/50ML IV EMUL
INTRAVENOUS | Status: DC | PRN
  Administered 2015-12-25: 100 ug/kg/min via INTRAVENOUS

## 2015-12-25 SURGICAL SUPPLY — 22 items

## 2015-12-25 NOTE — Transfer of Care (Signed)
Immediate Anesthesia Transfer of Care Note  Patient: Carlos Phillips  Procedure(s) Performed: Procedure(s): COLONOSCOPY WITH PROPOFOL (N/A)  Patient Location: PACU  Anesthesia Type:MAC  Level of Consciousness:  sedated, patient cooperative and responds to stimulation  Airway & Oxygen Therapy:Patient Spontanous Breathing and Patient connected to face mask oxgen  Post-op Assessment:  Report given to PACU RN and Post -op Vital signs reviewed and stable  Post vital signs:  Reviewed and stable  Last Vitals:  Vitals:   12/25/15 0922  BP: 123/80  Pulse: 64  Resp: 16  Temp: [id number] C    Complications: No apparent anesthesia complications

## 2015-12-25 NOTE — Anesthesia Postprocedure Evaluation (Signed)
Anesthesia Post Note  Patient: Carlos Phillips  Procedure(s) Performed: Procedure(s) (LRB): COLONOSCOPY WITH PROPOFOL (N/A)  Patient location during evaluation: PACU Anesthesia Type: MAC Level of consciousness: awake and alert Pain management: pain level controlled Vital Signs Assessment: post-procedure vital signs reviewed and stable Respiratory status: spontaneous breathing, nonlabored ventilation, respiratory function stable and patient connected to nasal cannula oxygen Cardiovascular status: stable and blood pressure returned to baseline Anesthetic complications: no    Last Vitals:  Vitals:   12/25/15 0922 12/25/15 1014  BP: 123/80 135/71  Pulse: 64 84  Resp: 16 16  Temp: 36.8 C 36.3 C    Last Pain:  Vitals:   12/25/15 1014  TempSrc: Axillary                 Zenaida Deed

## 2015-12-25 NOTE — H&P (Signed)
   HPI :  50 y/o male with morbid obesity here for first time colonoscopy. On plavix for PVD, held for the past 5 days.   Past Medical History:  Diagnosis Date  . Diabetes mellitus without complication ([place])   . DVT (deep venous thrombosis) ([place]) 02/2013  . Erectile dysfunction   . Hyperlipidemia   . Hypertension   . Obesity    BMI >52  . PVD (peripheral vascular disease) ([place])      Past Surgical History:  Procedure Laterality Date  . ABDOMINAL AORTAGRAM N/A 02/28/2013   Procedure: ABDOMINAL Maxcine Ham;  Surgeon: Conrad Offerle, MD;  [facility] CATH LAB;  Service: Cardiovascular;  Laterality: N/A;  . EMBOLECTOMY Left 02/25/2013   Procedure: Left Popliteal EMBOLECTOMY Poss fasciotomy;  Surgeon: Elam Dutch, MD;  Location: [facility] OR;  Service: Vascular;  Laterality: Left;  Left poplital and Tibial embolectomy with four compartment Fasciotomy with vein patch angioplasty left popliteal artery.   Family History  Problem Relation Age of Onset  . Diabetes Mother   . Heart disease Father   . Asthma Son   . Colon cancer Neg Hx    Social History  Substance Use Topics  . Smoking status: Former Smoker    Packs/day: 1.00    Years: 30.00    Types: Cigarettes    Quit date: 02/25/2013  . Smokeless tobacco: Never Used  . Alcohol use 0.0 oz/week     Comment: social- rare   Current Facility-Administered Medications  Medication Dose Route Frequency Provider Last Rate Last Dose  . 0.9 %  sodium chloride infusion   Intravenous Continuous Manus Gunning, MD      . 0.9 %  sodium chloride infusion   Intravenous Continuous Manus Gunning, MD      . lactated ringers infusion   Intravenous Continuous Manus Gunning, MD       Allergies  Allergen Reactions  . Morphine And Related Nausea And Vomiting     Review of Systems: All systems reviewed and negative except where noted in HPI.   Lab Results  Component Value Date   WBC 4.6 05/07/2015   HGB 17.3 05/07/2015   HCT  51.3 05/07/2015   MCV 85.2 05/07/2015   PLT 288 08/05/2013   Lab Results  Component Value Date   CREATININE 1.18 06/21/2015   BUN 11 06/21/2015   NA 142 06/21/2015   K 4.5 06/21/2015   CL 106 06/21/2015   CO2 27 06/21/2015    Lab Results  Component Value Date   INR 0.87 02/25/2013      Physical Exam: There were no vitals taken for this visit. Constitutional: Pleasant,well-developed, male in no acute distress.  Cardiovascular: Normal rate, regular rhythm.  Pulmonary/chest: Effort normal and breath sounds normal. No wheezing, rales or rhonchi. Abdominal: Soft, obese abdomen, nontender.  There are no masses palpable.  Extremities: no edema    ASSESSMENT AND PLAN: 50 y/o male here for first time screening for colon cancer. Discussed risks / benefits of colonoscopy and he wishes to proceed. Plavix held for the past 5 days.   Laurel Cellar, MD [facility] Pager [phone]

## 2015-12-25 NOTE — Anesthesia Preprocedure Evaluation (Signed)
Anesthesia Evaluation  Patient identified by MRN, date of birth, ID band Patient awake    Reviewed: Allergy & Precautions, H&P , NPO status , Patient's Chart, lab work & pertinent test results, reviewed documented beta blocker date and time   History of Anesthesia Complications Negative for: history of anesthetic complications  Airway Mallampati: I  TM Distance: >3 FB Neck ROM: Full    Dental  (+) Teeth Intact, Missing, Dental Advisory Given,    Pulmonary Current Smoker,    Pulmonary exam normal breath sounds clear to auscultation       Cardiovascular hypertension, Pt. on medications + Peripheral Vascular Disease  Normal cardiovascular exam Rhythm:Regular Rate:Normal     Neuro/Psych    GI/Hepatic negative GI ROS, Neg liver ROS,   Endo/Other  diabetesMorbid obesity  Renal/GU negative Renal ROS     Musculoskeletal   Abdominal (+) + obese,   Peds  Hematology negative hematology ROS (+)   Anesthesia Other Findings Super morbid obesity  Reproductive/Obstetrics                             Anesthesia Physical  Anesthesia Plan  ASA: III  Anesthesia Plan: MAC   Post-op Pain Management:    Induction: Intravenous  Airway Management Planned: Simple Face Mask  Additional Equipment:   Intra-op Plan:   Post-operative Plan: Extubation in OR  Informed Consent: I have reviewed the patients History and Physical, chart, labs and discussed the procedure including the risks, benefits and alternatives for the proposed anesthesia with the patient or authorized representative who has indicated his/her understanding and acceptance.   Dental advisory given  Plan Discussed with: CRNA and Surgeon  Anesthesia Plan Comments:         Anesthesia Quick Evaluation

## 2015-12-25 NOTE — Op Note (Signed)
[facility] Patient Name: Carlos Phillips Procedure Date: 12/25/2015 MRN: [id number] Attending MD: Carlota Raspberry. Havery Moros , MD Date of Birth: 1965/06/14 CSN: [id number] Age: 50 Admit Type: Outpatient Procedure:                Colonoscopy Indications:              Screening for malignant neoplasm in the colon, This                            is the patient's first colonoscopy, on Plavix Providers:                Carlota Raspberry. Havery Moros, MD, Cleda Daub, RN, Cherylynn Ridges, Technician, Delena Bali, CRNA Referring MD:              Medicines:                Monitored Anesthesia Care Complications:            No immediate complications. Estimated blood loss:                            Minimal. Estimated Blood Loss:     Estimated blood loss was minimal. Procedure:                Pre-Anesthesia Assessment:                           - Prior to the procedure, a History and Physical                            was performed, and patient medications and                            allergies were reviewed. The patient's tolerance of                            previous anesthesia was also reviewed. The risks                            and benefits of the procedure and the sedation                            options and risks were discussed with the patient.                            All questions were answered, and informed consent                            was obtained. Prior Anticoagulants: The patient has                            taken Plavix (clopidogrel), last dose was 5 days  prior to procedure. ASA Grade Assessment: III - A                            patient with severe systemic disease. After                            reviewing the risks and benefits, the patient was                            deemed in satisfactory condition to undergo the                            procedure.                           After  obtaining informed consent, the colonoscope                            was passed under direct vision. Throughout the                            procedure, the patient's blood pressure, pulse, and                            oxygen saturations were monitored continuously. The                            [id number]) scope was introduced through                            the anus and advanced to the the cecum, identified                            by appendiceal orifice and ileocecal valve. The                            colonoscopy was performed without difficulty. The                            patient tolerated the procedure well. The quality                            of the bowel preparation was good. The ileocecal                            valve, appendiceal orifice, and rectum were                            photographed. Scope In: 9:41:50 AM Scope Out: 10:06:38 AM Scope Withdrawal Time: 0 hours 21 minutes 58 seconds  Total Procedure Duration: 0 hours 24 minutes 48 seconds  Findings:      The perianal and digital rectal examinations were normal.      A 3 mm polyp was found in the ascending colon. The polyp was sessile.  The polyp was removed with a cold biopsy forceps. Resection and       retrieval were complete.      A 5 mm polyp was found in the ascending colon. The polyp was sessile.       The polyp was removed with a cold snare. Resection and retrieval were       complete.      A 6 mm polyp was found in the transverse colon. The polyp was sessile.       The polyp was removed with a cold snare. Resection and retrieval were       complete.      A 4 mm polyp was found in the descending colon. The polyp was sessile.       The polyp was removed with a cold snare. Resection and retrieval were       complete.      Scattered medium-mouthed diverticula were found in the transverse colon       and left colon.      The exam was otherwise without abnormality on direct and  retroflexion       views. Impression:               - One 3 mm polyp in the ascending colon, removed                            with a cold biopsy forceps. Resected and retrieved.                           - One 5 mm polyp in the ascending colon, removed                            with a cold snare. Resected and retrieved.                           - One 6 mm polyp in the transverse colon, removed                            with a cold snare. Resected and retrieved.                           - One 4 mm polyp in the descending colon, removed                            with a cold snare. Resected and retrieved.                           - Diverticulosis in the transverse colon and in the                            left colon.                           - The examination was otherwise normal on direct                            and retroflexion views. Moderate Sedation:  No moderate sedation, case performed with MAC Recommendation:           - Patient has a contact number available for                            emergencies. The signs and symptoms of potential                            delayed complications were discussed with the                            patient. Return to normal activities tomorrow.                            Written discharge instructions were provided to the                            patient.                           - Resume previous diet.                           - Continue present medications.                           - Resume Plavix (clopidogrel) at prior dose in 3                            days.                           - No ibuprofen, advil, or other NSAIDS for 2 weeks                           - Await pathology results.                           - Repeat colonoscopy is recommended for                            surveillance. The colonoscopy date will be                            determined after pathology results from today's                             exam become available for review. Procedure Code(s):        --- Professional ---                           [phone], Colonoscopy, flexible; with removal of                            tumor(s), polyp(s), or other lesion(s) by snare  technique                           [id number], 59, Colonoscopy, flexible; with biopsy,                            single or multiple Diagnosis Code(s):        --- Professional ---                           Z12.11, Encounter for screening for malignant                            neoplasm of colon                           D12.2, Benign neoplasm of ascending colon                           D12.3, Benign neoplasm of transverse colon (hepatic                            flexure or splenic flexure)                           K57.30, Diverticulosis of large intestine without                            perforation or abscess without bleeding CPT copyright 2016 American Medical Association. All rights reserved. The codes documented in this report are preliminary and upon coder review may  be revised to meet current compliance requirements. Remo Lipps P. Kyli Sorter, MD 12/25/2015 10:14:30 AM This report has been signed electronically. Number of Addenda: 0

## 2015-12-25 NOTE — Interval H&P Note (Signed)
History and Physical Interval Note:  12/25/2015 9:02 AM  Carlos Phillips  has presented today for surgery, with the diagnosis of screening colon  The various methods of treatment have been discussed with the patient and family. After consideration of risks, benefits and other options for treatment, the patient has consented to  Procedure(s): COLONOSCOPY WITH PROPOFOL (N/A) as a surgical intervention .  The patient's history has been reviewed, patient examined, no change in status, stable for surgery.  I have reviewed the patient's chart and labs.  Questions were answered to the patient's satisfaction.     Renelda Loma Armbruster

## 2015-12-25 NOTE — Discharge Instructions (Signed)
YOU HAD AN ENDOSCOPIC PROCEDURE TODAY: Refer to the procedure report and other information in the discharge instructions given to you for any specific questions about what was found during the examination. If this information does not answer your questions, please call [place] office at [phone] to clarify.  ° °YOU SHOULD EXPECT: Some feelings of bloating in the abdomen. Passage of more gas than usual. Walking can help get rid of the air that was put into your GI tract during the procedure and reduce the bloating. If you had a lower endoscopy (such as a colonoscopy or flexible sigmoidoscopy) you may notice spotting of blood in your stool or on the toilet paper. Some abdominal soreness may be present for a day or two, also. ° °DIET: Your first meal following the procedure should be a light meal and then it is ok to progress to your normal diet. A half-sandwich or bowl of soup is an example of a good first meal. Heavy or fried foods are harder to digest and may make you feel nauseous or bloated. Drink plenty of fluids but you should avoid alcoholic beverages for 24 hours. If you had a esophageal dilation, please see attached instructions for diet.   ° °ACTIVITY: Your care partner should take you home directly after the procedure. You should plan to take it easy, moving slowly for the rest of the day. You can resume normal activity the day after the procedure however YOU SHOULD NOT DRIVE, use power tools, machinery or perform tasks that involve climbing or major physical exertion for 24 hours (because of the sedation medicines used during the test).  ° °SYMPTOMS TO REPORT IMMEDIATELY: °A gastroenterologist can be reached at any hour. Please call [phone]  for any of the following symptoms:  °Following lower endoscopy (colonoscopy, flexible sigmoidoscopy) °Excessive amounts of blood in the stool  °Significant tenderness, worsening of abdominal pains  °Swelling of the abdomen that is new, acute  °Fever of 100° or  higher  °Following upper endoscopy (EGD, EUS, ERCP, esophageal dilation) °Vomiting of blood or coffee ground material  °New, significant abdominal pain  °New, significant chest pain or pain under the shoulder blades  °Painful or persistently difficult swallowing  °New shortness of breath  °Black, tarry-looking or red, bloody stools ° °FOLLOW UP:  °If any biopsies were taken you will be contacted by phone or by letter within the next 1-3 weeks. Call [phone]  if you have not heard about the biopsies in 3 weeks.  °Please also call with any specific questions about appointments or follow up tests. ° °

## 2015-12-28 ENCOUNTER — Encounter: Payer: Self-pay | Admitting: Gastroenterology

## 2015-12-29 NOTE — Progress Notes (Signed)
Letter mailed 12/29/15.jf 

## 2015-12-31 ENCOUNTER — Ambulatory Visit: Payer: Self-pay | Admitting: Family Medicine

## 2016-01-27 ENCOUNTER — Encounter: Payer: Self-pay | Admitting: Family

## 2016-01-28 ENCOUNTER — Encounter (HOSPITAL_COMMUNITY): Payer: Self-pay | Admitting: *Deleted

## 2016-01-28 ENCOUNTER — Ambulatory Visit (INDEPENDENT_AMBULATORY_CARE_PROVIDER_SITE_OTHER): Payer: 59

## 2016-01-28 ENCOUNTER — Inpatient Hospital Stay (HOSPITAL_COMMUNITY)
Admission: EM | Admit: 2016-01-28 | Discharge: 2016-02-01 | DRG: 872 | Disposition: A | Discharge status: Home / Self Care | Payer: 59 | Attending: Family Medicine | Admitting: Family Medicine

## 2016-01-28 ENCOUNTER — Ambulatory Visit (INDEPENDENT_AMBULATORY_CARE_PROVIDER_SITE_OTHER): Payer: 59 | Admitting: Family Medicine

## 2016-01-28 ENCOUNTER — Emergency Department (HOSPITAL_COMMUNITY): Payer: 59

## 2016-01-28 VITALS — BP 122/86 | HR 114 | Temp 100.6°F | Resp 17 | Ht 72.0 in | Wt 387.0 lb

## 2016-01-28 DIAGNOSIS — E11620 Type 2 diabetes mellitus with diabetic dermatitis: Secondary | ICD-10-CM | POA: Diagnosis present

## 2016-01-28 DIAGNOSIS — E785 Hyperlipidemia, unspecified: Secondary | ICD-10-CM | POA: Diagnosis present

## 2016-01-28 DIAGNOSIS — Z885 Allergy status to narcotic agent status: Secondary | ICD-10-CM | POA: Diagnosis not present

## 2016-01-28 DIAGNOSIS — Z7902 Long term (current) use of antithrombotics/antiplatelets: Secondary | ICD-10-CM

## 2016-01-28 DIAGNOSIS — K5732 Diverticulitis of large intestine without perforation or abscess without bleeding: Secondary | ICD-10-CM

## 2016-01-28 DIAGNOSIS — I10 Essential (primary) hypertension: Secondary | ICD-10-CM | POA: Diagnosis present

## 2016-01-28 DIAGNOSIS — N529 Male erectile dysfunction, unspecified: Secondary | ICD-10-CM | POA: Diagnosis present

## 2016-01-28 DIAGNOSIS — K5792 Diverticulitis of intestine, part unspecified, without perforation or abscess without bleeding: Secondary | ICD-10-CM | POA: Diagnosis present

## 2016-01-28 DIAGNOSIS — I739 Peripheral vascular disease, unspecified: Secondary | ICD-10-CM | POA: Diagnosis present

## 2016-01-28 DIAGNOSIS — K5720 Diverticulitis of large intestine with perforation and abscess without bleeding: Secondary | ICD-10-CM | POA: Diagnosis not present

## 2016-01-28 DIAGNOSIS — A419 Sepsis, unspecified organism: Secondary | ICD-10-CM | POA: Diagnosis present

## 2016-01-28 DIAGNOSIS — Z87891 Personal history of nicotine dependence: Secondary | ICD-10-CM

## 2016-01-28 DIAGNOSIS — E1151 Type 2 diabetes mellitus with diabetic peripheral angiopathy without gangrene: Secondary | ICD-10-CM | POA: Diagnosis present

## 2016-01-28 DIAGNOSIS — Z79899 Other long term (current) drug therapy: Secondary | ICD-10-CM | POA: Diagnosis not present

## 2016-01-28 DIAGNOSIS — R109 Unspecified abdominal pain: Secondary | ICD-10-CM | POA: Diagnosis not present

## 2016-01-28 DIAGNOSIS — Z794 Long term (current) use of insulin: Secondary | ICD-10-CM

## 2016-01-28 DIAGNOSIS — K5900 Constipation, unspecified: Secondary | ICD-10-CM | POA: Diagnosis present

## 2016-01-28 DIAGNOSIS — Z6841 Body Mass Index (BMI) 40.0 and over, adult: Secondary | ICD-10-CM | POA: Diagnosis not present

## 2016-01-28 DIAGNOSIS — K5780 Diverticulitis of intestine, part unspecified, with perforation and abscess without bleeding: Secondary | ICD-10-CM | POA: Diagnosis not present

## 2016-01-28 DIAGNOSIS — E876 Hypokalemia: Secondary | ICD-10-CM | POA: Diagnosis present

## 2016-01-28 LAB — POCT CBC
Granulocyte percent: 82.2 %G — AB (ref 37–80)
HCT, POC: 46.9 % (ref 43.5–53.7)
Hemoglobin: 15.8 g/dL (ref 14.1–18.1)
Lymph, poc: 2.5 (ref 0.6–3.4)
MCH, POC: 29.2 pg (ref 27–31.2)
MCHC: 33.8 g/dL (ref 31.8–35.4)
MCV: 86.3 fL (ref 80–97)
MID (cbc): 0.6 (ref 0–0.9)
MPV: 8.1 fL (ref 0–99.8)
POC Granulocyte: 14.3 — AB (ref 2–6.9)
POC LYMPH PERCENT: 14.6 %L (ref 10–50)
POC MID %: 3.2 %M (ref 0–12)
Platelet Count, POC: 248 10*3/uL (ref 142–424)
RBC: 5.43 M/uL (ref 4.69–6.13)
RDW, POC: 14.9 %
WBC: 17.4 10*3/uL — AB (ref 4.6–10.2)

## 2016-01-28 LAB — CBC WITH DIFFERENTIAL/PLATELET
Basophils Absolute: 0 10*3/uL (ref 0.0–0.1)
Basophils Relative: 0 %
Eosinophils Absolute: 0.1 10*3/uL (ref 0.0–0.7)
Eosinophils Relative: 1 %
HCT: 46.2 % (ref 39.0–52.0)
Hemoglobin: 15.8 g/dL (ref 13.0–17.0)
Lymphocytes Relative: 17 %
Lymphs Abs: 3.1 10*3/uL (ref 0.7–4.0)
MCH: 29.5 pg (ref 26.0–34.0)
MCHC: 34.2 g/dL (ref 30.0–36.0)
MCV: 86.4 fL (ref 78.0–100.0)
Monocytes Absolute: 1.6 10*3/uL — ABNORMAL HIGH (ref 0.1–1.0)
Monocytes Relative: 9 %
Neutro Abs: 13.4 10*3/uL — ABNORMAL HIGH (ref 1.7–7.7)
Neutrophils Relative %: 73 %
Platelets: 251 10*3/uL (ref 150–400)
RBC: 5.35 MIL/uL (ref 4.22–5.81)
RDW: 15.6 % — ABNORMAL HIGH (ref 11.5–15.5)
WBC: 18.2 10*3/uL — ABNORMAL HIGH (ref 4.0–10.5)

## 2016-01-28 LAB — COMPREHENSIVE METABOLIC PANEL
ALT: 16 U/L — ABNORMAL LOW (ref 17–63)
AST: 18 U/L (ref 15–41)
Albumin: 4 g/dL (ref 3.5–5.0)
Alkaline Phosphatase: 66 U/L (ref 38–126)
Anion gap: 11 (ref 5–15)
BUN: 13 mg/dL (ref 6–20)
CO2: 22 mmol/L (ref 22–32)
Calcium: 9.4 mg/dL (ref 8.9–10.3)
Chloride: 101 mmol/L (ref 101–111)
Creatinine, Ser: 1.28 mg/dL — ABNORMAL HIGH (ref 0.61–1.24)
GFR calc Af Amer: >60 mL/min (ref >60)
GFR calc non Af Amer: >60 mL/min (ref >60)
Glucose, Bld: 103 mg/dL — ABNORMAL HIGH (ref 65–99)
Potassium: 3.4 mmol/L — ABNORMAL LOW (ref 3.5–5.1)
Sodium: 134 mmol/L — ABNORMAL LOW (ref 135–145)
Total Bilirubin: 1.3 mg/dL — ABNORMAL HIGH (ref 0.3–1.2)
Total Protein: 7.3 g/dL (ref 6.5–8.1)

## 2016-01-28 LAB — URINALYSIS, ROUTINE W REFLEX MICROSCOPIC
Bilirubin Urine: NEGATIVE
Glucose, UA: NEGATIVE mg/dL
Hgb urine dipstick: NEGATIVE
Ketones, ur: NEGATIVE mg/dL
Leukocytes, UA: NEGATIVE
Nitrite: NEGATIVE
Protein, ur: 30 mg/dL — AB
Specific Gravity, Urine: 1.019 (ref 1.005–1.030)
pH: 8.5 — ABNORMAL HIGH (ref 5.0–8.0)

## 2016-01-28 LAB — POCT URINALYSIS DIP (MANUAL ENTRY)
Bilirubin, UA: NEGATIVE
Blood, UA: NEGATIVE
Glucose, UA: NEGATIVE
Ketones, POC UA: NEGATIVE
Leukocytes, UA: NEGATIVE
Nitrite, UA: NEGATIVE
Protein Ur, POC: NEGATIVE
Spec Grav, UA: 1.015
Urobilinogen, UA: 0.2
pH, UA: 8.5

## 2016-01-28 LAB — URINE MICROSCOPIC-ADD ON
Bacteria, UA: NONE SEEN
RBC / HPF: NONE SEEN RBC/hpf (ref 0–5)
Squamous Epithelial / LPF: NONE SEEN
WBC, UA: NONE SEEN WBC/hpf (ref 0–5)

## 2016-01-28 LAB — POC MICROSCOPIC URINALYSIS (UMFC): Mucus: ABSENT

## 2016-01-28 LAB — LIPASE, BLOOD: Lipase: 16 U/L (ref 11–51)

## 2016-01-28 LAB — I-STAT CG4 LACTIC ACID, ED: Lactic Acid, Venous: 1.38 mmol/L (ref 0.5–1.9)

## 2016-01-28 MED ORDER — IOPAMIDOL (ISOVUE-300) INJECTION 61%
100.0000 mL | Freq: Once | INTRAVENOUS | Status: AC | PRN
  Administered 2016-01-28: 100 mL via INTRAVENOUS

## 2016-01-28 MED ORDER — PIPERACILLIN-TAZOBACTAM 3.375 G IVPB 30 MIN
3.3750 g | Freq: Once | INTRAVENOUS | Status: AC
Start: 2016-01-28 — End: 2016-01-28
  Administered 2016-01-28: 3.375 g via INTRAVENOUS
  Filled 2016-01-28: qty 50

## 2016-01-28 MED ORDER — ONDANSETRON HCL 4 MG/2ML IJ SOLN
4.0000 mg | Freq: Once | INTRAMUSCULAR | Status: AC
  Administered 2016-01-28: 4 mg via INTRAVENOUS
  Filled 2016-01-28: qty 2

## 2016-01-28 MED ORDER — HYDROMORPHONE HCL 1 MG/ML IJ SOLN
0.5000 mg | Freq: Once | INTRAMUSCULAR | Status: AC
  Administered 2016-01-28: 0.5 mg via INTRAVENOUS
  Filled 2016-01-28: qty 1

## 2016-01-28 MED ORDER — SODIUM CHLORIDE 0.9 % IV BOLUS (SEPSIS)
1000.0000 mL | Freq: Once | INTRAVENOUS | Status: AC
  Administered 2016-01-28: 1000 mL via INTRAVENOUS

## 2016-01-28 NOTE — ED Notes (Signed)
Contacted lab about CMP, advised that sample hemolyzed.  Lab advised that has been recollected and will post results when available.

## 2016-01-28 NOTE — ED Notes (Signed)
Attempted x2 for culture.  Unable to retrieve.  Phlebotomy currently at bedside to attempt.

## 2016-01-28 NOTE — ED Triage Notes (Addendum)
Pt sent from [facility]. Pt complains of lower abdominal pain since last night. Pt had a bowel movement, which he states provided some relief. Pt tried an "herbal detox", which he states produced several loose stools. Pt had x-ray, cbc and urinalysis performed at UC; pt had WBC of 17.4 and negative x-ray and urinalysis. Pt had fever of 100.3 with EMS.

## 2016-01-28 NOTE — ED Provider Notes (Signed)
[facility] DEPT Provider Note   CSN: [id number] Arrival date & time: 01/28/16  1541     History   Chief Complaint Chief Complaint  Patient presents with  . Abdominal Pain    HPI Carlos Phillips is a 50 y.o. male.  50yo M w/ PMH including T2DM, HTN, PVD, HLD, DVT who p/w abdominal pain. PT began having lower abdominal pain yesterday that has worsened since it began. It is constant and currently severe, 10/10, worse w/ movement and better lying still. He has had associated nausea, no vomiting. Started running fevers today. He thought initially that he was constipated and he took an herbal cleansing regimen and had several non-bloody loose bowel movements without any relief of his pain. He saw his PCP today, where urinalysis and x-ray were unremarkable and he was sent here due to fever and WBC 17000. He has never had this pain before. No history of abdominal surgery.   The history is provided by the patient.    Past Medical History:  Diagnosis Date  . Diabetes mellitus without complication ([place])   . DVT (deep venous thrombosis) ([place]) 02/2013  . Erectile dysfunction   . Hyperlipidemia   . Hypertension   . Obesity    BMI >52  . PVD (peripheral vascular disease) [facility])     Patient Active Problem List   Diagnosis Date Noted  . Special screening for malignant neoplasms, colon   . Type 2 diabetes mellitus with diabetic dermatitis, without long-term current use of insulin ([place]) 05/03/2015  . Embolism ([place]) 03/07/2013  . Ischemia of extremity 02/25/2013  . HTN (hypertension) 05/07/2011  . Erectile dysfunction 05/07/2011  . Obesity 05/07/2011    Past Surgical History:  Procedure Laterality Date  . ABDOMINAL AORTAGRAM N/A 02/28/2013   Procedure: ABDOMINAL Maxcine Ham;  Surgeon: Conrad Trinity, MD;  Location: [facility] CATH LAB;  Service: Cardiovascular;  Laterality: N/A;  . COLONOSCOPY WITH PROPOFOL N/A 12/25/2015   Procedure: COLONOSCOPY WITH PROPOFOL;  Surgeon: Manus Gunning, MD;  Location: [facility] ENDOSCOPY;  Service: Gastroenterology;  Laterality: N/A;  . EMBOLECTOMY Left 02/25/2013   Procedure: Left Popliteal EMBOLECTOMY Poss fasciotomy;  Surgeon: Elam Dutch, MD;  Location: [facility] OR;  Service: Vascular;  Laterality: Left;  Left poplital and Tibial embolectomy with four compartment Fasciotomy with vein patch angioplasty left popliteal artery.       Home Medications    Prior to Admission medications   Medication Sig Start Date End Date Taking? Authorizing Provider  amLODipine (NORVASC) 10 MG tablet TAKE ONE TABLET BY MOUTH ONCE DAILY 09/24/15  Yes Wendie Agreste, MD  atorvastatin (LIPITOR) 10 MG tablet Take one tablet by mouth once daily. 09/24/15  Yes Wendie Agreste, MD  cloNIDine (CATAPRES) 0.1 MG tablet TAKE ONE TABLET BY MOUTH TWICE DAILY 09/24/15  Yes Wendie Agreste, MD  clopidogrel (PLAVIX) 75 MG tablet Take 1 tablet (75 mg total) by mouth daily with breakfast. 09/24/15  Yes Wendie Agreste, MD  hydrochlorothiazide (HYDRODIURIL) 25 MG tablet TAKE ONE TABLET BY MOUTH ONCE DAILY. 09/24/15  Yes Wendie Agreste, MD  metFORMIN (GLUCOPHAGE) 500 MG tablet Take 1 tablet (500 mg total) by mouth daily with breakfast. 09/24/15  Yes Wendie Agreste, MD  Multiple Vitamins-Minerals (MULTIVITAMIN WITH MINERALS) tablet Take 1 tablet by mouth daily.   Yes Historical Provider, MD  Blood Glucose Monitoring Suppl (GLUCOCOM BLOOD GLUCOSE MONITOR) DEVI 1 kit by Does not apply route once. 06/21/15   Wendie Agreste, MD  glucose  blood test strip To test blood sugar once per day. Dx 250.00.  Brand per insurance coverage. 06/21/15   Wendie Agreste, MD  Lancet Device MISC To test blood sugar once per day. Dx 250.00.  Brand per insurance coverage. 06/21/15   Wendie Agreste, MD  sildenafil (VIAGRA) 100 MG tablet Take 0.5-1 tablets (50-100 mg total) by mouth daily as needed for erectile dysfunction. 09/24/15 09/22/16  Wendie Agreste, MD    Family History Family History    Problem Relation Age of Onset  . Diabetes Mother   . Heart disease Father   . Asthma Son   . Colon cancer Neg Hx     Social History Social History  Substance Use Topics  . Smoking status: Former Smoker    Packs/day: 1.00    Years: 30.00    Types: Cigarettes    Quit date: 02/25/2013  . Smokeless tobacco: Never Used  . Alcohol use 0.0 oz/week     Comment: social- rare     Allergies   Morphine and related   Review of Systems Review of Systems 10 Systems reviewed and are negative for acute change except as noted in the HPI.  Physical Exam Updated Vital Signs BP 105/77 (BP Location: Left Arm)   Pulse 96   Temp 99.8 F (37.7 C) (Oral)   Resp 22   SpO2 99%   Physical Exam  Constitutional: He is oriented to person, place, and time. He appears well-developed and well-nourished. No distress.  Uncomfortable, laying on L side  HENT:  Head: Normocephalic and atraumatic.  Moist mucous membranes  Eyes: Conjunctivae are normal. Pupils are equal, round, and reactive to light.  Neck: Neck supple.  Cardiovascular: Normal rate, regular rhythm and normal heart sounds.   No murmur heard. Pulmonary/Chest: Effort normal and breath sounds normal.  Abdominal: Soft. Bowel sounds are normal. He exhibits no distension. There is no tenderness.  TTP RLQ and suprapubic abdomen, mild rebound, no guarding + Rovsing's sign  Musculoskeletal: He exhibits no edema.  Neurological: He is alert and oriented to person, place, and time.  Fluent speech  Skin: Skin is warm and dry.  Psychiatric: He has a normal mood and affect. Judgment normal.  Nursing note and vitals reviewed.    ED Treatments / Results  Labs (all labs ordered are listed, but only abnormal results are displayed) Labs Reviewed  URINE CULTURE  CULTURE, BLOOD (ROUTINE X 2)  CULTURE, BLOOD (ROUTINE X 2)  COMPREHENSIVE METABOLIC PANEL  CBC WITH DIFFERENTIAL/PLATELET  LIPASE, BLOOD  URINALYSIS, ROUTINE W REFLEX MICROSCOPIC  (NOT AT [facility])  I-STAT CG4 LACTIC ACID, ED    EKG  EKG Interpretation None       Radiology Dg Abd 1 View  Result Date: 01/28/2016 CLINICAL DATA:  50 year old male with abdominal pain and fever for 1 day. Initial encounter. EXAM: ABDOMEN - 1 VIEW COMPARISON:  CTA abdomen by femoral exam 02/25/2013. FINDINGS: Lung bases not included. The upper abdomen is not entirely included. Visualized bowel gas pattern is non obstructed. There is a left iliac artery vascular stent. Degenerative changes at both hips. No acute osseous abnormality identified. IMPRESSION: 1. Normal bowel gas pattern. 2. Left iliac artery vascular stent in place. Electronically Signed   By: Genevie Ann M.D.   On: 01/28/2016 14:19   Ct Abdomen Pelvis W Contrast  Result Date: 01/28/2016 CLINICAL DATA:  Acute onset of constipation followed by diarrhea. Lower abdominal pain and nausea. Initial encounter. EXAM: CT ABDOMEN AND PELVIS WITH  CONTRAST TECHNIQUE: Multidetector CT imaging of the abdomen and pelvis was performed using the standard protocol following bolus administration of intravenous contrast. CONTRAST:  135m ISOVUE-300 IOPAMIDOL (ISOVUE-300) INJECTION 61% COMPARISON:  CT of the abdomen and pelvis from 02/25/2013 FINDINGS: Lower chest: Minimal right basilar atelectasis is noted. The visualized portions of the mediastinum are unremarkable. Hepatobiliary: The liver is unremarkable in appearance. The gallbladder is unremarkable in appearance. The common bile duct remains normal in caliber. Pancreas: The pancreas is within normal limits. Spleen: The spleen is unremarkable in appearance. Adrenals/Urinary Tract: The adrenal glands are unremarkable in appearance. The kidneys are within normal limits. There is no evidence of hydronephrosis. No renal or ureteral stones are identified. No perinephric stranding is seen. Stomach/Bowel: The stomach is unremarkable in appearance. The small bowel is within normal limits. The appendix is normal in  caliber, without evidence of appendicitis. There is minimal diverticulosis at the mid sigmoid colon, with surrounding soft tissue inflammation and trace fluid, reflecting acute diverticulitis. A trace amount of air is seen extending along the mesentery, compatible with microperforation. There is no evidence of abscess formation at this time. Soft tissue inflammation extends proximally nearly to the level of the aortic bifurcation. Vascular/Lymphatic: Minimal calcification is seen at the distal abdominal aorta and its branches. Reproductive: The bladder is mildly distended and grossly unremarkable. The prostate remains normal in size. Other: No additional soft tissue abnormalities are seen. Musculoskeletal: No acute osseous abnormalities are identified. The visualized musculature is unremarkable in appearance. IMPRESSION: Acute diverticulitis at the mid sigmoid colon, with surrounding soft tissue inflammation and trace fluid. Trace air extending along the mesentery, compatible with microperforation. No evidence of abscess formation at this time. Soft tissue inflammation extends proximally nearly to the level of the aortic bifurcation. These results were called by telephone at the time of interpretation on 01/28/2016 at 8:05 pm to Dr. RTheotis Burrow who verbally acknowledged these results. Electronically Signed   By: JGarald BaldingM.D.   On: 01/28/2016 20:07    Procedures Procedures (including critical care time)  Medications Ordered in ED Medications  sodium chloride 0.9 % bolus 1,000 mL (not administered)     Initial Impression / Assessment and Plan / ED Course  I have reviewed the triage vital signs and the nursing notes.  Pertinent labs & imaging results that were available during my care of the patient were reviewed by me and considered in my medical decision making (see chart for details).  Clinical Course   Patient with 1 day of worsening abdominal pain associated with nausea and no fevers.  He was awake and alert, uncomfortable but in no acute distress at presentation. He had a fever at the urgent care of 100.6, the remainder of his vital signs were stable here. He had right lower quadrant tenderness as well as a positive Rovsing sign. Obtained above labs including blood and urine cultures, gave the patient Dilaudid and Zofran as well as IV fluid bolus. Obtained CT of the abdomen and pelvis to evaluate for acute process such as appendicitis or diverticulitis.  CT shows acute diverticulitis of the sigmoid colon with findings of microperforation, no evidence of abscess. Patient was well-appearing on reexamination. Gave a dose of Zosyn. He has of his fever, leukocytosis, and microperforation on CT, I recommended admission for antibiotics and observation to ensure improvement. Discussed w/ [facility] hospitalist, Dr. YLorin Mercy and pt admitted for further care. Final Clinical Impressions(s) / ED Diagnoses   Final diagnoses:  Diverticulitis of large intestine without bleeding,  unspecified complication status    New Prescriptions New Prescriptions   No medications on file     Sharlett Iles, MD 01/29/16 [phone]

## 2016-01-28 NOTE — H&P (Signed)
History and Physical    Carlos Phillips VQM:[id number] DOB: May 06, 1965 DOA: 01/28/2016  PCP: Wendie Agreste, MD Consultants:  None Patient coming from: Home - lives with wife; NOK: wife, [phone]  Chief Complaint: abdominal pain  HPI: Carlos Phillips is a 50 y.o. male with medical history significant of morbid obesity, PVD, HTN, HLD, DVT s/p embolectomy (2014) and DM presenting with abdominal pain and fever. Symptoms started lst night.  Felt like he was constipated, went to restroom and "it came out long and hard".  He took some "detox stuff" with some improvement but it worsened this AM when he went to work.  Fever to 101 at home, 100.6 at Urgent Care.  Suprapubic region pain, stabbing pain, very sharp.  Constant.  Dilaudid made it better, nothing makes it worse.  +nausea, no vomiting.  No h/o similar.  Dr. Mingo Amber saw him at Urgent Care today and sent him by EMS for concern about appendicitis, sepsis.     ED Course:  Per Dr. Rex Kras: Patient with 1 day of worsening abdominal pain associated with nausea and no fevers. He was awake and alert, uncomfortable but in no acute distress at presentation. He had a fever at the urgent care of 100.6, the remainder of his vital signs were stable here. He had right lower quadrant tenderness as well as a positive Rovsing sign. Obtained above labs including blood and urine cultures, gave the patient Dilaudid and Zofran as well as IV fluid bolus. Obtained CT of the abdomen and pelvis to evaluate for acute process such as appendicitis or diverticulitis.  Review of Systems: As per HPI; otherwise 10 point review of systems reviewed and negative.   Ambulatory Status:  Ambulates independently  Past Medical History:  Diagnosis Date  . Diabetes mellitus without complication ([place])   . DVT (deep venous thrombosis) ([place]) 02/2013  . Erectile dysfunction   . Hyperlipidemia   . Hypertension   . Obesity    BMI >52  . PVD (peripheral vascular disease) ([place])       Past Surgical History:  Procedure Laterality Date  . ABDOMINAL AORTAGRAM N/A 02/28/2013   Procedure: ABDOMINAL Maxcine Ham;  Surgeon: Conrad [place], MD;  Location[facility] C[facility] LAB;  Service: Cardiovascular;  Laterality: N/A;  . COLONOSCOPY WITH PROPOFOL N/A 12/25/2015   Procedure: COLONOSCOPY WITH PROPOFOL;  Surgeon: Manus Gunning, MD;  Location: [facility] ENDOSCOPY;  Service: Gastroenterology;  Laterality: N/A;  . EMBOLECTOMY Left 02/25/2013   Procedure: Left Popliteal EMBOLECTOMY Poss fasciotomy;  Surgeon: Elam Dutch, MD;  Location: [facility] OR;  Service: Vascular;  Laterality: Left;  Left poplital and Tibial embolectomy with four compartment Fasciotomy with vein patch angioplasty left popliteal artery.    Social History   Social History  . Marital status: Married    Spouse name: Ivin Booty  . Number of children: 2  . Years of education: N/A   Occupational History  . Truck driver    Social History Main Topics  . Smoking status: Former Smoker    Packs/day: 1.00    Years: 30.00    Types: Cigarettes    Quit date: 02/25/2013  . Smokeless tobacco: Never Used  . Alcohol use 0.0 oz/week     Comment: social- rare  . Drug use: No  . Sexual activity: Yes    Partners: Female   Other Topics Concern  . Not on file   Social History Narrative  . No narrative on file    Allergies  Allergen Reactions  . Morphine  And Related Nausea And Vomiting    Family History  Problem Relation Age of Onset  . Diabetes Mother   . Heart disease Father   . Asthma Son   . Colon cancer Neg Hx     Prior to Admission medications   Medication Sig Start Date End Date Taking? Authorizing Provider  amLODipine (NORVASC) 10 MG tablet TAKE ONE TABLET BY MOUTH ONCE DAILY 09/24/15  Yes Wendie Agreste, MD  atorvastatin (LIPITOR) 10 MG tablet Take one tablet by mouth once daily. 09/24/15  Yes Wendie Agreste, MD  cloNIDine (CATAPRES) 0.1 MG tablet TAKE ONE TABLET BY MOUTH TWICE DAILY 09/24/15  Yes Wendie Agreste, MD  clopidogrel (PLAVIX) 75 MG tablet Take 1 tablet (75 mg total) by mouth daily with breakfast. 09/24/15  Yes Wendie Agreste, MD  hydrochlorothiazide (HYDRODIURIL) 25 MG tablet TAKE ONE TABLET BY MOUTH ONCE DAILY. 09/24/15  Yes Wendie Agreste, MD  metFORMIN (GLUCOPHAGE) 500 MG tablet Take 1 tablet (500 mg total) by mouth daily with breakfast. 09/24/15  Yes Wendie Agreste, MD  Multiple Vitamins-Minerals (MULTIVITAMIN WITH MINERALS) tablet Take 1 tablet by mouth daily.   Yes Historical Provider, MD  Blood Glucose Monitoring Suppl (GLUCOCOM BLOOD GLUCOSE MONITOR) DEVI 1 kit by Does not apply route once. 06/21/15   Wendie Agreste, MD  glucose blood test strip To test blood sugar once per day. Dx 250.00.  Brand per insurance coverage. 06/21/15   Wendie Agreste, MD  Lancet Device MISC To test blood sugar once per day. Dx 250.00.  Brand per insurance coverage. 06/21/15   Wendie Agreste, MD  sildenafil (VIAGRA) 100 MG tablet Take 0.5-1 tablets (50-100 mg total) by mouth daily as needed for erectile dysfunction. 09/24/15 09/22/16  Wendie Agreste, MD    Physical Exam: Vitals:   01/28/16 1955 01/28/16 1959 01/28/16 2240 01/28/16 2350  BP: 104/68  126/77 137/87  Pulse: 91  88 83  Resp: '18  18 18  ' Temp:    98.6 F (37 C)  TempSrc:   Oral Oral  SpO2: 94% 96% 100% 99%  Weight:    (!) 172.4 kg (380 lb)  Height:    6' (1.829 m)     General:  Appears calm and comfortable and is NAD Eyes:  PERRL, EOMI, normal lids, iris ENT:  grossly normal hearing, lips & tongue, mmm Neck:  no LAD, masses or thyromegaly Cardiovascular:  RRR, no m/r/g. No LE edema.  Respiratory:  CTA bilaterally, no w/r/r. Normal respiratory effort. Abdomen:  soft, suprapubic TTP, nd, NABS Skin:  no rash or induration seen on limited exam Musculoskeletal:  grossly normal tone BUE/BLE, good ROM, no bony abnormality Psychiatric:  grossly normal mood and affect, speech fluent and appropriate, AOx3 Neurologic:  CN 2-12  grossly intact, moves all extremities in coordinated fashion, sensation intact  Labs on Admission: I have personally reviewed following labs and imaging studies  CBC:  Recent Labs Lab 01/28/16 1427 01/28/16 1658  WBC 17.4* 18.2*  NEUTROABS  --  13.4*  HGB 15.8 15.8  HCT 46.9 46.2  MCV 86.3 86.4  PLT  --  585   Basic Metabolic Panel:  Recent Labs Lab 01/28/16 1408 01/28/16 1710  NA 136 134*  K 4.1 3.4*  CL 99 101  CO2 24 22  GLUCOSE 113* 103*  BUN 13 13  CREATININE 1.44* 1.28*  CALCIUM 9.6 9.4   GFR: Estimated Creatinine Clearance: 111.5 mL/min (by C-G formula based on SCr  of 1.28 mg/dL (H)). Liver Function Tests:  Recent Labs Lab 01/28/16 1408 01/28/16 1710  AST 16 18  ALT 16 16*  ALKPHOS 69 66  BILITOT 0.8 1.3*  PROT 7.0 7.3  ALBUMIN 4.1 4.0    Recent Labs Lab 01/28/16 1710  LIPASE 16   No results for input(s): AMMONIA in the last 168 hours. Coagulation Profile:  Recent Labs Lab 01/29/16 0041  INR 1.11   Cardiac Enzymes: No results for input(s): CKTOTAL, CKMB, CKMBINDEX, TROPONINI in the last 168 hours. BNP (last 3 results) No results for input(s): PROBNP in the last 8760 hours. HbA1C: No results for input(s): HGBA1C in the last 72 hours. CBG:  Recent Labs Lab 01/29/16 0022  GLUCAP 103*   Lipid Profile: No results for input(s): CHOL, HDL, LDLCALC, TRIG, CHOLHDL, LDLDIRECT in the last 72 hours. Thyroid Function Tests: No results for input(s): TSH, T4TOTAL, FREET4, T3FREE, THYROIDAB in the last 72 hours. Anemia Panel: No results for input(s): VITAMINB12, FOLATE, FERRITIN, TIBC, IRON, RETICCTPCT in the last 72 hours. Urine analysis:    Component Value Date/Time   COLORURINE YELLOW 01/28/2016 [place] 01/28/2016 1704   LABSPEC 1.019 01/28/2016 1704   PHURINE 8.5 (H) 01/28/2016 1704   GLUCOSEU NEGATIVE 01/28/2016 1704   HGBUR NEGATIVE 01/28/2016 1704   BILIRUBINUR NEGATIVE 01/28/2016 1704   BILIRUBINUR negative  01/28/2016 1429   BILIRUBINUR NEG 05/07/2011 1055   KETONESUR NEGATIVE 01/28/2016 1704   PROTEINUR 30 (A) 01/28/2016 1704   UROBILINOGEN 0.2 01/28/2016 1429   NITRITE NEGATIVE 01/28/2016 1704   LEUKOCYTESUR NEGATIVE 01/28/2016 1704    Creatinine Clearance: Estimated Creatinine Clearance: 111.5 mL/min (by C-G formula based on SCr of 1.28 mg/dL (H)).  Sepsis Labs: '@LABRCNTIP' (procalcitonin:4,lacticidven:4) )No results found for this or any previous visit (from the past 240 hour(s)).   Radiological Exams on Admission: Dg Abd 1 View  Result Date: 01/28/2016 CLINICAL DATA:  50 year old male with abdominal pain and fever for 1 day. Initial encounter. EXAM: ABDOMEN - 1 VIEW COMPARISON:  CTA abdomen by femoral exam 02/25/2013. FINDINGS: Lung bases not included. The upper abdomen is not entirely included. Visualized bowel gas pattern is non obstructed. There is a left iliac artery vascular stent. Degenerative changes at both hips. No acute osseous abnormality identified. IMPRESSION: 1. Normal bowel gas pattern. 2. Left iliac artery vascular stent in place. Electronically Signed   By: Genevie Ann M.D.   On: 01/28/2016 14:19   Ct Abdomen Pelvis W Contrast  Result Date: 01/28/2016 CLINICAL DATA:  Acute onset of constipation followed by diarrhea. Lower abdominal pain and nausea. Initial encounter. EXAM: CT ABDOMEN AND PELVIS WITH CONTRAST TECHNIQUE: Multidetector CT imaging of the abdomen and pelvis was performed using the standard protocol following bolus administration of intravenous contrast. CONTRAST:  116m ISOVUE-300 IOPAMIDOL (ISOVUE-300) INJECTION 61% COMPARISON:  CT of the abdomen and pelvis from 02/25/2013 FINDINGS: Lower chest: Minimal right basilar atelectasis is noted. The visualized portions of the mediastinum are unremarkable. Hepatobiliary: The liver is unremarkable in appearance. The gallbladder is unremarkable in appearance. The common bile duct remains normal in caliber. Pancreas: The  pancreas is within normal limits. Spleen: The spleen is unremarkable in appearance. Adrenals/Urinary Tract: The adrenal glands are unremarkable in appearance. The kidneys are within normal limits. There is no evidence of hydronephrosis. No renal or ureteral stones are identified. No perinephric stranding is seen. Stomach/Bowel: The stomach is unremarkable in appearance. The small bowel is within normal limits. The appendix is normal in caliber, without evidence  of appendicitis. There is minimal diverticulosis at the mid sigmoid colon, with surrounding soft tissue inflammation and trace fluid, reflecting acute diverticulitis. A trace amount of air is seen extending along the mesentery, compatible with microperforation. There is no evidence of abscess formation at this time. Soft tissue inflammation extends proximally nearly to the level of the aortic bifurcation. Vascular/Lymphatic: Minimal calcification is seen at the distal abdominal aorta and its branches. Reproductive: The bladder is mildly distended and grossly unremarkable. The prostate remains normal in size. Other: No additional soft tissue abnormalities are seen. Musculoskeletal: No acute osseous abnormalities are identified. The visualized musculature is unremarkable in appearance. IMPRESSION: Acute diverticulitis at the mid sigmoid colon, with surrounding soft tissue inflammation and trace fluid. Trace air extending along the mesentery, compatible with microperforation. No evidence of abscess formation at this time. Soft tissue inflammation extends proximally nearly to the level of the aortic bifurcation. These results were called by telephone at the time of interpretation on 01/28/2016 at 8:05 pm to Dr. Theotis Burrow, who verbally acknowledged these results. Electronically Signed   By: Garald Balding M.D.   On: 01/28/2016 20:07    EKG: Not done  Assessment/Plan Principal Problem:   Sepsis ([place]) Active Problems:   HTN (hypertension)   Type 2  diabetes mellitus with diabetic dermatitis, without long-term current use of insulin (HCC)   Diverticulitis   PVD (peripheral vascular disease) ([place])   Sepsis from diverticulitis with microperforations --Elevated WBC count, fever, tachycardia with normal lactate -Sepsis protocol initiated -Diverticulitis as source -Blood and urine cultures pending -Will admit to med surg and continue to monitor -Treat with IV Zosyn for sepsis with intraabdominal infection as source -Will trend lactate to ensure improvement  PVD -Continue Plavix and Lipitor -Has left iliac artery vascular stent in place  HTN -Continue Norvasc, Catapres, HCTZ -Monitor BP  DM -Bariatric bed -Hold Glucophage, cover with SSI   DVT prophylaxis: Lovenox  Code Status:  Full - confirmed with patient/family Family Communication: Wife (works at [employer]) present at bedside throughout evaluation  Disposition Plan:  Home once clinically improved Consults called: None  Admission status: Admit - It is my clinical opinion that admission to INPATIENT is reasonable and necessary because this patient will require at least 2 midnights in the hospital to treat this condition based on the medical complexity of the problems presented.  Given the aforementioned information, the predictability of an adverse outcome is felt to be significant.     Karmen Bongo MD [facility]  If 7PM-7AM, please contact night-coverage [email or web address] Password [email or web address]  01/29/2016, 1:31 AM

## 2016-01-28 NOTE — Progress Notes (Signed)
Carlos Phillips is a 50 y.o. male who presents to [facility] today for abdominal pain and fever:  1.  Abd pain:  Actual pain started today.  Patient describes sensation of constipation yesterday, which he qualifies as needing to have a BM without being able to.  Wife gave him a type of "all natural cleanse" and be began having multiple episodes of loose, watery stool.  Last was 3 AM this morning.  No further BMs since then.  States he can't remember if he's had any flatus today.    Pain began this morning as sharp stabbing pain in lower abdomen. Has been fairly constant in nature. Worse with standing or walking. 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  Felt feverish at home.   With some nausea.  Unable to tolerate any food today, anorexic.  No urinary symptoms. No penile discharge. No scrotal swelling. No skin lesions. No rash. No cough. No upper respiratory symptoms. No dyspnea.  Family history: No family history of intra-abdominal infections. The sample history diabetes and high blood pressure and asthma in parents and son.  He has history of PAD, DVT, controlled dm2.    ROS as above.   PMH reviewed. Patient is a nonsmoker.   Past Medical History:  Diagnosis Date  . Diabetes mellitus without complication ([place])   . DVT (deep venous thrombosis) ([place]) 02/2013  . Erectile dysfunction   . Hyperlipidemia   . Hypertension   . Obesity    BMI >52  . PVD (peripheral vascular disease) ([place])    Past Surgical History:  Procedure Laterality Date  . ABDOMINAL AORTAGRAM N/A 02/28/2013   Procedure: ABDOMINAL Maxcine Ham;  Surgeon: Conrad Hillside, MD;  Location: [facility] CATH LAB;  Service: Cardiovascular;  Laterality: N/A;  . COLONOSCOPY WITH PROPOFOL N/A 12/25/2015   Procedure: COLONOSCOPY WITH PROPOFOL;  Surgeon: Manus Gunning, MD;  Location: [facility] ENDOSCOPY;  Service:  Gastroenterology;  Laterality: N/A;  . EMBOLECTOMY Left 02/25/2013   Procedure: Left Popliteal EMBOLECTOMY Poss fasciotomy;  Surgeon: Elam Dutch, MD;  Location[facility] OR;  Service: Vascular;  Laterality: Left;  Left poplital and Tibial embolectomy with four compartment Fasciotomy with vein patch angioplasty left popliteal artery.    Medications reviewed.   Physical Exam:  BP 122/86 (BP Location: Left Arm, Patient Position: Sitting, Cuff Size: Large)   Pulse (!) 114   Temp (!) 100.6 F (38.1 C) (Oral)   Resp 17   Ht 6' (1.829 m)   Wt (!) 387 lb (175.5 kg)   SpO2 99%   BMI 52.49 kg/m  Gen:  Alert, cooperative patient who appears stated age in no acute distress.  Vital signs reviewed.  Appears uncomfortable.  Occasional chills with rigors.   Head: [place]/AT.   Eyes:  EOMI, PERRL.   Ears:  External ears WNL, Bilateral TM's normal without retraction, redness or bulging. Nose:  Septum midline  Mouth:  MMM, tonsils non-erythematous, non-edematous.   Neck:  Supple, no thyromegaly. Pulm:  Clear to auscultation bilaterally  Cardiac:  Regular rate and rhythm without murmur auscultated.  Good S1/S2. Abd:  Soft/obese.  Nondistended.  TTP moderate to severe suprapubic area as well as RLQ.  Some voluntary guarding.  No rebound. Exts: Non edematous BL  LE, warm and well perfused.  Skin: No rash or lesions.  Neuro:  Alert and  oriented 4. No focal deficits noted. Psych:  Not depressed or anxious appearing.  Linear and coherent thought process as evidenced by speech pattern. Smiles spontaneously.    Results for orders placed or performed in visit on 01/28/16  POCT CBC  Result Value Ref Range   WBC 17.4 (A) 4.6 - 10.2 K/uL   Lymph, poc 2.5 0.6 - 3.4   POC LYMPH PERCENT 14.6 10 - 50 %L   MID (cbc) 0.6 0 - 0.9   POC MID % 3.2 0 - 12 %M   POC Granulocyte 14.3 (A) 2 - 6.9   Granulocyte percent 82.2 (A) 37 - 80 %G   RBC 5.43 4.69 - 6.13 M/uL   Hemoglobin 15.8 14.1 - 18.1 g/dL   HCT, POC 46.9  43.5 - 53.7 %   MCV 86.3 80 - 97 fL   MCH, POC 29.2 27 - 31.2 pg   MCHC 33.8 31.8 - 35.4 g/dL   RDW, POC 14.9 %   Platelet Count, POC 248 142 - 424 K/uL   MPV 8.1 0 - 99.8 fL  POCT Microscopic Urinalysis ([facility])  Result Value Ref Range   WBC,UR,HPF,POC None None WBC/hpf   RBC,UR,HPF,POC None None RBC/hpf   Bacteria None None, Too numerous to count   Mucus Absent Absent   Epithelial Cells, UR Per Microscopy None None, Too numerous to count cells/hpf  POCT urinalysis dipstick  Result Value Ref Range   Color, UA yellow yellow   Clarity, UA clear clear   Glucose, UA negative negative   Bilirubin, UA negative negative   Ketones, POC UA negative negative   Spec Grav, UA 1.015    Blood, UA negative negative   pH, UA 8.5    Protein Ur, POC negative negative   Urobilinogen, UA 0.2    Nitrite, UA Negative Negative   Leukocytes, UA Negative Negative     Assessment and Plan:  1.  Abdominal pain with fever: - concern for appendicitis.  He has no evidence of bowel obstruction on his KUB nor any evidence of bladder/urinary involvement or infection. - WBC 17. -At the very least he meets SIRS criteria with some intra-abdominal source, which would make him septic. Unable to elucidate exact etiology without further imaging. -Other possibilities are diverticulitis or colitis.   -Plan is to send him by EMS to [facility] emergency department for further workup including abdominal imaging.

## 2016-01-28 NOTE — ED Notes (Signed)
Unable to collect labs at this time MD is in the room with patient

## 2016-01-28 NOTE — Patient Instructions (Signed)
     IF you received an x-ray today, you will receive an invoice from [facility]. Please contact [facility] at [phone] with questions or concerns regarding your invoice.   IF you received labwork today, you will receive an invoice from [facility]. Please contact [facility] at [phone] with questions or concerns regarding your invoice.   Our billing staff will not be able to assist you with questions regarding bills from these companies.  You will be contacted with the lab results as soon as they are available. The fastest way to get your results is to activate your My Chart account. Instructions are located on the last page of this paperwork. If you have not heard from us regarding the results in 2 weeks, please contact this office.      

## 2016-01-29 ENCOUNTER — Ambulatory Visit: Payer: 59 | Admitting: Family

## 2016-01-29 ENCOUNTER — Encounter (HOSPITAL_COMMUNITY): Payer: 59

## 2016-01-29 DIAGNOSIS — E11620 Type 2 diabetes mellitus with diabetic dermatitis: Secondary | ICD-10-CM

## 2016-01-29 DIAGNOSIS — A419 Sepsis, unspecified organism: Secondary | ICD-10-CM | POA: Diagnosis present

## 2016-01-29 DIAGNOSIS — I10 Essential (primary) hypertension: Secondary | ICD-10-CM

## 2016-01-29 DIAGNOSIS — K5780 Diverticulitis of intestine, part unspecified, with perforation and abscess without bleeding: Secondary | ICD-10-CM

## 2016-01-29 DIAGNOSIS — I739 Peripheral vascular disease, unspecified: Secondary | ICD-10-CM

## 2016-01-29 LAB — CBC
HCT: 40.9 % (ref 39.0–52.0)
Hemoglobin: 13.6 g/dL (ref 13.0–17.0)
MCH: 28.9 pg (ref 26.0–34.0)
MCHC: 33.3 g/dL (ref 30.0–36.0)
MCV: 86.8 fL (ref 78.0–100.0)
Platelets: 216 10*3/uL (ref 150–400)
RBC: 4.71 MIL/uL (ref 4.22–5.81)
RDW: 14.6 % (ref 11.5–15.5)
WBC: 17.3 10*3/uL — ABNORMAL HIGH (ref 4.0–10.5)

## 2016-01-29 LAB — COMPREHENSIVE METABOLIC PANEL
ALT: 16 U/L (ref 9–46)
AST: 16 U/L (ref 10–35)
Albumin: 4.1 g/dL (ref 3.6–5.1)
Alkaline Phosphatase: 69 U/L (ref 40–115)
BUN: 13 mg/dL (ref 7–25)
CO2: 24 mmol/L (ref 20–31)
Calcium: 9.6 mg/dL (ref 8.6–10.3)
Chloride: 99 mmol/L (ref 98–110)
Creat: 1.44 mg/dL — ABNORMAL HIGH (ref 0.70–1.33)
Glucose, Bld: 113 mg/dL — ABNORMAL HIGH (ref 65–99)
Potassium: 4.1 mmol/L (ref 3.5–5.3)
Sodium: 136 mmol/L (ref 135–146)
Total Bilirubin: 0.8 mg/dL (ref 0.2–1.2)
Total Protein: 7 g/dL (ref 6.1–8.1)

## 2016-01-29 LAB — BASIC METABOLIC PANEL
Anion gap: 6 (ref 5–15)
BUN: 12 mg/dL (ref 6–20)
CO2: 27 mmol/L (ref 22–32)
Calcium: 8.5 mg/dL — ABNORMAL LOW (ref 8.9–10.3)
Chloride: 103 mmol/L (ref 101–111)
Creatinine, Ser: 1.36 mg/dL — ABNORMAL HIGH (ref 0.61–1.24)
GFR calc Af Amer: >60 mL/min (ref >60)
GFR calc non Af Amer: 59 mL/min — ABNORMAL LOW (ref >60)
Glucose, Bld: 130 mg/dL — ABNORMAL HIGH (ref 65–99)
Potassium: 3.5 mmol/L (ref 3.5–5.1)
Sodium: 136 mmol/L (ref 135–145)

## 2016-01-29 LAB — APTT: aPTT: 30 seconds (ref 24–36)

## 2016-01-29 LAB — LACTIC ACID, PLASMA
Lactic Acid, Venous: 0.9 mmol/L (ref 0.5–1.9)
Lactic Acid, Venous: 0.8 mmol/L (ref 0.5–1.9)

## 2016-01-29 LAB — GLUCOSE, CAPILLARY
Glucose-Capillary: 103 mg/dL — ABNORMAL HIGH (ref 65–99)
Glucose-Capillary: 103 mg/dL — ABNORMAL HIGH (ref 65–99)
Glucose-Capillary: 103 mg/dL — ABNORMAL HIGH (ref 65–99)
Glucose-Capillary: 111 mg/dL — ABNORMAL HIGH (ref 65–99)
Glucose-Capillary: 116 mg/dL — ABNORMAL HIGH (ref 65–99)
Glucose-Capillary: 119 mg/dL — ABNORMAL HIGH (ref 65–99)
Glucose-Capillary: 121 mg/dL — ABNORMAL HIGH (ref 65–99)

## 2016-01-29 LAB — PROTIME-INR
INR: 1.11
Prothrombin Time: 14.4 seconds (ref 11.4–15.2)

## 2016-01-29 LAB — PROCALCITONIN: Procalcitonin: 0.1 ng/mL

## 2016-01-29 MED ORDER — LACTATED RINGERS IV SOLN
INTRAVENOUS | Status: DC
  Administered 2016-01-29 (×2): via INTRAVENOUS
  Administered 2016-01-29: 125 mL via INTRAVENOUS
  Administered 2016-01-29 – 2016-01-31 (×4): via INTRAVENOUS

## 2016-01-29 MED ORDER — CLOPIDOGREL BISULFATE 75 MG PO TABS
75.0000 mg | ORAL_TABLET | Freq: Every day | ORAL | Status: DC
  Administered 2016-01-29 – 2016-02-01 (×4): 75 mg via ORAL
  Filled 2016-01-29 (×4): qty 1

## 2016-01-29 MED ORDER — CLONIDINE HCL 0.1 MG PO TABS
0.1000 mg | ORAL_TABLET | Freq: Two times a day (BID) | ORAL | Status: DC
  Administered 2016-01-29 – 2016-01-31 (×6): 0.1 mg via ORAL
  Filled 2016-01-29 (×6): qty 1

## 2016-01-29 MED ORDER — HYDROMORPHONE HCL 1 MG/ML IJ SOLN
0.5000 mg | INTRAMUSCULAR | Status: DC | PRN
  Administered 2016-01-29 – 2016-01-30 (×4): 0.5 mg via INTRAVENOUS
  Filled 2016-01-29 (×4): qty 1

## 2016-01-29 MED ORDER — ENOXAPARIN SODIUM 80 MG/0.8ML ~~LOC~~ SOLN
80.0000 mg | Freq: Every day | SUBCUTANEOUS | Status: DC
  Administered 2016-01-29 – 2016-01-31 (×3): 80 mg via SUBCUTANEOUS
  Filled 2016-01-29 (×3): qty 0.8

## 2016-01-29 MED ORDER — ACETAMINOPHEN 325 MG PO TABS
650.0000 mg | ORAL_TABLET | Freq: Four times a day (QID) | ORAL | Status: DC | PRN
  Administered 2016-01-29 – 2016-01-30 (×3): 650 mg via ORAL
  Filled 2016-01-29 (×3): qty 2

## 2016-01-29 MED ORDER — ONDANSETRON HCL 4 MG/2ML IJ SOLN: 4.0000 mg | Freq: Four times a day (QID) | INTRAMUSCULAR | Status: DC | PRN

## 2016-01-29 MED ORDER — ENOXAPARIN SODIUM 40 MG/0.4ML ~~LOC~~ SOLN: 40.0000 mg | SUBCUTANEOUS | Status: DC

## 2016-01-29 MED ORDER — INSULIN ASPART 100 UNIT/ML ~~LOC~~ SOLN: 0.0000 [IU] | SUBCUTANEOUS | Status: DC

## 2016-01-29 MED ORDER — ATORVASTATIN CALCIUM 10 MG PO TABS
10.0000 mg | ORAL_TABLET | Freq: Every day | ORAL | Status: DC
  Administered 2016-01-29 – 2016-01-31 (×4): 10 mg via ORAL
  Filled 2016-01-29 (×5): qty 1

## 2016-01-29 MED ORDER — ONDANSETRON HCL 4 MG PO TABS: 4.0000 mg | ORAL_TABLET | Freq: Four times a day (QID) | ORAL | Status: DC | PRN

## 2016-01-29 MED ORDER — HYDROCHLOROTHIAZIDE 25 MG PO TABS
25.0000 mg | ORAL_TABLET | Freq: Every day | ORAL | Status: DC
  Administered 2016-01-29 – 2016-01-31 (×3): 25 mg via ORAL
  Filled 2016-01-29 (×3): qty 1

## 2016-01-29 MED ORDER — ACETAMINOPHEN 650 MG RE SUPP: 650.0000 mg | Freq: Four times a day (QID) | RECTAL | Status: DC | PRN

## 2016-01-29 MED ORDER — PIPERACILLIN-TAZOBACTAM 3.375 G IVPB
3.3750 g | Freq: Three times a day (TID) | INTRAVENOUS | Status: DC
  Administered 2016-01-29 – 2016-02-01 (×10): 3.375 g via INTRAVENOUS
  Filled 2016-01-29 (×11): qty 50

## 2016-01-29 MED ORDER — AMLODIPINE BESYLATE 10 MG PO TABS
10.0000 mg | ORAL_TABLET | Freq: Every day | ORAL | Status: DC
  Administered 2016-01-29 – 2016-01-31 (×3): 10 mg via ORAL
  Filled 2016-01-29 (×3): qty 1

## 2016-01-29 NOTE — Progress Notes (Signed)
[facility]  PROGRESS NOTE  Carlos Phillips [id number] DOB: [birth year] DOA: 01/28/2016 PCP: Wendie Agreste, MD   Brief HPI:   50 year old male with medical history of morbid obesity, peripheral vascular disease, hypertension, hyperlipidemia, DVT status post embolectomy in 2014, diabetes mellitus came with abdominal pain and fever.   CT scan abdomen pelvis was obtained which showed acute diverticulitis at the mid sigmoid colon, with surrounding softtissue inflammation and trace fluid. Trace air extending along themesentery, compatible with microperforation. No evidence of abscessformation at this time. Soft tissue inflammation extends proximallynearly to the level of the aortic bifurcation. Patient started on IV Zosyn/    Subjective   This morning patient feels much better. Says the pain is almost gone. Still nothing by mouth    Assessment/Plan:     1. Sigmoid diverticulitis- CT scan showed acute diverticulitis at the mid sigmoid colon with surrounding soft tissue inflammation in place fluid. Trace air extending along the mesentery compatible with microperforation. Continue IV Zosyn. Patient will be kept nothing by mouth. Continue IV fluids Ringer lactate at 125 mL per hour. 2. Type 2 diabetes mellitus- blood glucose is well controlled, continue sliding scale insulin with NovoLog. 3. Hypertension-blood pressure is well controlled, continue amlodipine, Catapres, HCTZ. 4. Hyperlipidemia-continue atorvastatin 5. Peripheral vascular disease-continue Plavix, patient has left iliac artery stent in place.     DVT prophylaxis: Lovenox  Code Status: Full code  Family Communication: Discussed with patient's wife at bedside   Disposition Plan: Home when medically stable   Consultants:  None  Procedures:  None  Antibiotics:   Anti-infectives    Start     Dose/Rate Route Frequency Ordered Stop   01/29/16 0400  piperacillin-tazobactam (ZOSYN) IVPB 3.375 g     3.375  g 12.5 mL/hr over 240 Minutes Intravenous Every 8 hours 01/29/16 0017     01/28/16 2015  piperacillin-tazobactam (ZOSYN) IVPB 3.375 g     3.375 g 100 mL/hr over 30 Minutes Intravenous  Once 01/28/16 2008 01/28/16 2237       Objective   Vitals:   01/28/16 2240 01/28/16 2350 01/29/16 0544 01/29/16 1011  BP: 126/77 137/87 (!) 113/59 106/69  Pulse: 88 83 81   Resp: 18 18 16    Temp:  98.6 F (37 C) 98.1 F (36.7 C)   TempSrc: Oral Oral Oral   SpO2: 100% 99% 100%   Weight:  (!) 172.4 kg (380 lb)    Height:  6' (1.829 m)      Intake/Output Summary (Last 24 hours) at 01/29/16 1028 Last data filed at 01/29/16 0933  Gross per 24 hour  Intake           720.83 ml  Output              900 ml  Net          -179.17 ml   Filed Weights   01/28/16 2350  Weight: (!) 172.4 kg (380 lb)     Physical Examination:  General exam: Appears calm and comfortable. Respiratory system: Clear to auscultation. Respiratory effort normal. Cardiovascular system:  RRR. No  murmurs, rubs, gallops. No pedal edema. GI system: Abdomen is nondistended, soft Mild tenderness in left lower quadrant. No organomegaly.  Central nervous system. No focal neurological deficits. 5 x 5 power in all extremities. Skin: No rashes, lesions or ulcers. Psychiatry: Alert, oriented x 3.Judgement and insight appear normal. Affect normal.    Data Reviewed: I have personally reviewed following labs and imaging studies  CBG:  Recent Labs Lab 01/29/16 0022 01/29/16 0358 01/29/16 0750  GLUCAP 103* 119* 116*    CBC:  Recent Labs Lab 01/28/16 1427 01/28/16 1658 01/29/16 0324  WBC 17.4* 18.2* 17.3*  NEUTROABS  --  13.4*  --   HGB 15.8 15.8 13.6  HCT 46.9 46.2 40.9  MCV 86.3 86.4 86.8  PLT  --  251 [id number]    Basic Metabolic Panel:  Recent Labs Lab 01/28/16 1408 01/28/16 1710 01/29/16 0324  NA 136 134* 136  K 4.1 3.4* 3.5  CL 99 101 103  CO2 24 22 27   GLUCOSE 113* 103* 130*  BUN 13 13 12   CREATININE  1.44* 1.28* 1.36*  CALCIUM 9.6 9.4 8.5*    No results found for this or any previous visit (from the past 240 hour(s)).   Liver Function Tests:  Recent Labs Lab 01/28/16 1408 01/28/16 1710  AST 16 18  ALT 16 16*  ALKPHOS 69 66  BILITOT 0.8 1.3*  PROT 7.0 7.3  ALBUMIN 4.1 4.0    Recent Labs Lab 01/28/16 1710  LIPASE 16   No results for input(s): AMMONIA in the last 168 hours.  Cardiac Enzymes: No results for input(s): CKTOTAL, CKMB, CKMBINDEX, TROPONINI in the last 168 hours. BNP (last 3 results) No results for input(s): BNP in the last 8760 hours.  ProBNP (last 3 results) No results for input(s): PROBNP in the last 8760 hours.    Studies: Dg Abd 1 View  Result Date: 01/28/2016 CLINICAL DATA:  50 year old male with abdominal pain and fever for 1 day. Initial encounter. EXAM: ABDOMEN - 1 VIEW COMPARISON:  CTA abdomen by femoral exam 02/25/2013. FINDINGS: Lung bases not included. The upper abdomen is not entirely included. Visualized bowel gas pattern is non obstructed. There is a left iliac artery vascular stent. Degenerative changes at both hips. No acute osseous abnormality identified. IMPRESSION: 1. Normal bowel gas pattern. 2. Left iliac artery vascular stent in place. Electronically Signed   By: Genevie Ann M.D.   On: 01/28/2016 14:19   Ct Abdomen Pelvis W Contrast  Result Date: 01/28/2016 CLINICAL DATA:  Acute onset of constipation followed by diarrhea. Lower abdominal pain and nausea. Initial encounter. EXAM: CT ABDOMEN AND PELVIS WITH CONTRAST TECHNIQUE: Multidetector CT imaging of the abdomen and pelvis was performed using the standard protocol following bolus administration of intravenous contrast. CONTRAST:  163mL ISOVUE-300 IOPAMIDOL (ISOVUE-300) INJECTION 61% COMPARISON:  CT of the abdomen and pelvis from 02/25/2013 FINDINGS: Lower chest: Minimal right basilar atelectasis is noted. The visualized portions of the mediastinum are unremarkable. Hepatobiliary: The liver  is unremarkable in appearance. The gallbladder is unremarkable in appearance. The common bile duct remains normal in caliber. Pancreas: The pancreas is within normal limits. Spleen: The spleen is unremarkable in appearance. Adrenals/Urinary Tract: The adrenal glands are unremarkable in appearance. The kidneys are within normal limits. There is no evidence of hydronephrosis. No renal or ureteral stones are identified. No perinephric stranding is seen. Stomach/Bowel: The stomach is unremarkable in appearance. The small bowel is within normal limits. The appendix is normal in caliber, without evidence of appendicitis. There is minimal diverticulosis at the mid sigmoid colon, with surrounding soft tissue inflammation and trace fluid, reflecting acute diverticulitis. A trace amount of air is seen extending along the mesentery, compatible with microperforation. There is no evidence of abscess formation at this time. Soft tissue inflammation extends proximally nearly to the level of the aortic bifurcation. Vascular/Lymphatic: Minimal calcification is seen at the distal abdominal aorta and  its branches. Reproductive: The bladder is mildly distended and grossly unremarkable. The prostate remains normal in size. Other: No additional soft tissue abnormalities are seen. Musculoskeletal: No acute osseous abnormalities are identified. The visualized musculature is unremarkable in appearance. IMPRESSION: Acute diverticulitis at the mid sigmoid colon, with surrounding soft tissue inflammation and trace fluid. Trace air extending along the mesentery, compatible with microperforation. No evidence of abscess formation at this time. Soft tissue inflammation extends proximally nearly to the level of the aortic bifurcation. These results were called by telephone at the time of interpretation on 01/28/2016 at 8:05 pm to Dr. Theotis Burrow, who verbally acknowledged these results. Electronically Signed   By: Garald Balding M.D.   On:  01/28/2016 20:07    Scheduled Meds: . amLODipine  10 mg Oral Daily  . atorvastatin  10 mg Oral q1800  . cloNIDine  0.1 mg Oral BID  . clopidogrel  75 mg Oral Q breakfast  . enoxaparin (LOVENOX) injection  80 mg Subcutaneous Daily  . hydrochlorothiazide  25 mg Oral Daily  . insulin aspart  0-20 Units Subcutaneous Q4H  . piperacillin-tazobactam  3.375 g Intravenous Q8H    Continuous Infusions: . lactated ringers 125 mL (01/29/16 0807)    Time spent: 25 min  [facility] Pager [phone]. If 7PM-7AM, please contact night-coverage at [email or web address], Office  [phone]  password [email or web address] 01/29/2016, 10:28 AM  LOS: 1 day

## 2016-01-29 NOTE — Progress Notes (Signed)
Pharmacy Antibiotic Note  Carlos Phillips is a 50 y.o. male admitted on 01/28/2016 with intra-abdominal infection.  Pharmacy has been consulted for zosyn dosing.  Plan: Zosyn 3.375 Gm IV q8h EI  Height: 6' (182.9 cm) Weight: (!) 380 lb (172.4 kg) IBW/kg (Calculated) : 77.6  Temp (24hrs), Avg:99.7 F (37.6 C), Min:98.6 F (37 C), Max:100.6 F (38.1 C)   Recent Labs Lab 01/28/16 1408 01/28/16 1427 01/28/16 1658 01/28/16 1710 01/28/16 1717  WBC  --  17.4* 18.2*  --   --   CREATININE 1.44*  --   --  1.28*  --   LATICACIDVEN  --   --   --   --  1.38    Estimated Creatinine Clearance: 111.5 mL/min (by C-G formula based on SCr of 1.28 mg/dL (H)).    Allergies  Allergen Reactions  . Morphine And Related Nausea And Vomiting    Antimicrobials this admission: 11/3 zosyn >>    Dose adjustments this admission:   Microbiology results:  BCx:   UCx:    Sputum:    MRSA PCR:   Thank you for allowing pharmacy to be a part of this patient's care.  Dorrene German 01/29/2016 12:37 AM

## 2016-01-29 NOTE — Progress Notes (Signed)
Rx Brief note:  Lovenox  Wt=172 kg, CrCl~111 ml/min, BMI=51  Rx adjusted Lovenox to 80 mg daily in pt with BMI>30  Thanks Dorrene German 01/29/2016 12:32 AM

## 2016-01-30 LAB — CBC
HCT: 41.4 % (ref 39.0–52.0)
Hemoglobin: 14 g/dL (ref 13.0–17.0)
MCH: 29.3 pg (ref 26.0–34.0)
MCHC: 33.8 g/dL (ref 30.0–36.0)
MCV: 86.6 fL (ref 78.0–100.0)
Platelets: 218 10*3/uL (ref 150–400)
RBC: 4.78 MIL/uL (ref 4.22–5.81)
RDW: 14.2 % (ref 11.5–15.5)
WBC: 15.2 10*3/uL — ABNORMAL HIGH (ref 4.0–10.5)

## 2016-01-30 LAB — BASIC METABOLIC PANEL
Anion gap: 7 (ref 5–15)
BUN: 10 mg/dL (ref 6–20)
CO2: 28 mmol/L (ref 22–32)
Calcium: 8.7 mg/dL — ABNORMAL LOW (ref 8.9–10.3)
Chloride: 103 mmol/L (ref 101–111)
Creatinine, Ser: 1.24 mg/dL (ref 0.61–1.24)
GFR calc Af Amer: >60 mL/min (ref >60)
GFR calc non Af Amer: >60 mL/min (ref >60)
Glucose, Bld: 112 mg/dL — ABNORMAL HIGH (ref 65–99)
Potassium: 3.6 mmol/L (ref 3.5–5.1)
Sodium: 138 mmol/L (ref 135–145)

## 2016-01-30 LAB — URINE CULTURE: Culture: <10000 — AB

## 2016-01-30 LAB — GLUCOSE, CAPILLARY
Glucose-Capillary: 102 mg/dL — ABNORMAL HIGH (ref 65–99)
Glucose-Capillary: 110 mg/dL — ABNORMAL HIGH (ref 65–99)
Glucose-Capillary: 110 mg/dL — ABNORMAL HIGH (ref 65–99)
Glucose-Capillary: 111 mg/dL — ABNORMAL HIGH (ref 65–99)
Glucose-Capillary: 111 mg/dL — ABNORMAL HIGH (ref 65–99)
Glucose-Capillary: 121 mg/dL — ABNORMAL HIGH (ref 65–99)

## 2016-01-30 NOTE — Consult Note (Signed)
Reason for Consult: Complicated diverticulitis Referring Physician: Dr Gar Ponto Carlos Phillips is an 50 y.o. male.  HPI: Pt presented to the ED last night with worsening LLQ pain that began on Thur.  He noted some constipation prior to this, which is unusual for him.  He has never had this before.  He had a colonoscopy last month that showed diverticulosis and several polyps.    Past Medical History:  Diagnosis Date  . Diabetes mellitus without complication ([place])   . DVT (deep venous thrombosis) ([place]) 02/2013  . Erectile dysfunction   . Hyperlipidemia   . Hypertension   . Obesity    BMI >52  . PVD (peripheral vascular disease) ([place])     Past Surgical History:  Procedure Laterality Date  . ABDOMINAL AORTAGRAM N/A 02/28/2013   Procedure: ABDOMINAL Maxcine Ham;  Surgeon: Conrad Yellowstone, MD;  Location: [facility] CATH LAB;  Service: Cardiovascular;  Laterality: N/A;  . COLONOSCOPY WITH PROPOFOL N/A 12/25/2015   Procedure: COLONOSCOPY WITH PROPOFOL;  Surgeon: Manus Gunning, MD;  Location: [facility] ENDOSCOPY;  Service: Gastroenterology;  Laterality: N/A;  . EMBOLECTOMY Left 02/25/2013   Procedure: Left Popliteal EMBOLECTOMY Poss fasciotomy;  Surgeon: Elam Dutch, MD;  Location: [facility] OR;  Service: Vascular;  Laterality: Left;  Left poplital and Tibial embolectomy with four compartment Fasciotomy with vein patch angioplasty left popliteal artery.    Family History  Problem Relation Age of Onset  . Diabetes Mother   . Heart disease Father   . Asthma Son   . Colon cancer Neg Hx     Social History:  reports that he quit smoking about 2 years ago. His smoking use included Cigarettes. He has a 30.00 pack-year smoking history. He has never used smokeless tobacco. He reports that he drinks alcohol. He reports that he does not use drugs.  Allergies:  Allergies  Allergen Reactions  . Morphine And Related Nausea And Vomiting    Medications: I have reviewed the patient's current  medications.  Results for orders placed or performed during the hospital encounter of 01/28/16 (from the past 48 hour(s))  CBC with Differential     Status: Abnormal   Collection Time: 01/28/16  4:58 PM  Result Value Ref Range   WBC 18.2 (H) 4.0 - 10.5 K/uL   RBC 5.35 4.22 - 5.81 MIL/uL   Hemoglobin 15.8 13.0 - 17.0 g/dL   HCT 46.2 39.0 - 52.0 %   MCV 86.4 78.0 - 100.0 fL   MCH 29.5 26.0 - 34.0 pg   MCHC 34.2 30.0 - 36.0 g/dL   RDW 15.6 (H) 11.5 - 15.5 %   Platelets 251 150 - 400 K/uL   Neutrophils Relative % 73 %   Neutro Abs 13.4 (H) 1.7 - 7.7 K/uL   Lymphocytes Relative 17 %   Lymphs Abs 3.1 0.7 - 4.0 K/uL   Monocytes Relative 9 %   Monocytes Absolute 1.6 (H) 0.1 - 1.0 K/uL   Eosinophils Relative 1 %   Eosinophils Absolute 0.1 0.0 - 0.7 K/uL   Basophils Relative 0 %   Basophils Absolute 0.0 0.0 - 0.1 K/uL  Urine culture     Status: Abnormal   Collection Time: 01/28/16  5:00 PM  Result Value Ref Range   Specimen Description URINE, CLEAN CATCH    Special Requests NONE    Culture (A)     <10,000 COLONIES/mL INSIGNIFICANT GROWTH Performed at [facility]    Report Status 01/30/2016 FINAL   Urinalysis, Routine  w reflex microscopic     Status: Abnormal   Collection Time: 01/28/16  5:04 PM  Result Value Ref Range   Color, Urine YELLOW YELLOW   APPearance CLEAR CLEAR   Specific Gravity, Urine 1.019 1.005 - 1.030   pH 8.5 (H) 5.0 - 8.0   Glucose, UA NEGATIVE NEGATIVE mg/dL   Hgb urine dipstick NEGATIVE NEGATIVE   Bilirubin Urine NEGATIVE NEGATIVE   Ketones, ur NEGATIVE NEGATIVE mg/dL   Protein, ur 30 (A) NEGATIVE mg/dL   Nitrite NEGATIVE NEGATIVE   Leukocytes, UA NEGATIVE NEGATIVE  Urine microscopic-add on     Status: None   Collection Time: 01/28/16  5:04 PM  Result Value Ref Range   Squamous Epithelial / LPF NONE SEEN NONE SEEN   WBC, UA NONE SEEN 0 - 5 WBC/hpf   RBC / HPF NONE SEEN 0 - 5 RBC/hpf   Bacteria, UA NONE SEEN NONE SEEN  Comprehensive metabolic  panel     Status: Abnormal   Collection Time: 01/28/16  5:10 PM  Result Value Ref Range   Sodium 134 (L) 135 - 145 mmol/L   Potassium 3.4 (L) 3.5 - 5.1 mmol/L   Chloride 101 101 - 111 mmol/L   CO2 22 22 - 32 mmol/L   Glucose, Bld 103 (H) 65 - 99 mg/dL   BUN 13 6 - 20 mg/dL   Creatinine, Ser 1.28 (H) 0.61 - 1.24 mg/dL   Calcium 9.4 8.9 - 10.3 mg/dL   Total Protein 7.3 6.5 - 8.1 g/dL   Albumin 4.0 3.5 - 5.0 g/dL   AST 18 15 - 41 U/L   ALT 16 (L) 17 - 63 U/L   Alkaline Phosphatase 66 38 - 126 U/L   Total Bilirubin 1.3 (H) 0.3 - 1.2 mg/dL   GFR calc non Af Amer >60 >60 mL/min   GFR calc Af Amer >60 >60 mL/min    Comment: (NOTE) The eGFR has been calculated using the CKD EPI equation. This calculation has not been validated in all clinical situations. eGFR's persistently <60 mL/min signify possible Chronic Kidney Disease.    Anion gap 11 5 - 15  Lipase, blood     Status: None   Collection Time: 01/28/16  5:10 PM  Result Value Ref Range   Lipase 16 11 - 51 U/L  Culture, blood (routine x 2)     Status: None (Preliminary result)   Collection Time: 01/28/16  5:11 PM  Result Value Ref Range   Specimen Description BLOOD RIGHT ANTECUBITAL    Special Requests BOTTLES DRAWN AEROBIC AND ANAEROBIC 5 CC EA    Culture      NO GROWTH 2 DAYS Performed at [facility]    Report Status PENDING   I-Stat CG4 Lactic Acid, ED     Status: None   Collection Time: 01/28/16  5:17 PM  Result Value Ref Range   Lactic Acid, Venous 1.38 0.5 - 1.9 mmol/L  Glucose, capillary     Status: Abnormal   Collection Time: 01/29/16 12:22 AM  Result Value Ref Range   Glucose-Capillary 103 (H) 65 - 99 mg/dL  Culture, blood (routine x 2)     Status: None (Preliminary result)   Collection Time: 01/29/16 12:40 AM  Result Value Ref Range   Specimen Description BLOOD RIGHT ARM    Special Requests BOTTLES DRAWN AEROBIC AND ANAEROBIC 5CC EACH    Culture      NO GROWTH 1 DAY Performed at [facility]  Report Status PENDING   Lactic acid, plasma     Status: None   Collection Time: 01/29/16 12:40 AM  Result Value Ref Range   Lactic Acid, Venous 0.9 0.5 - 1.9 mmol/L  Procalcitonin     Status: None   Collection Time: 01/29/16 12:41 AM  Result Value Ref Range   Procalcitonin 0.10 ng/mL    Comment:        Interpretation: PCT (Procalcitonin) <= 0.5 ng/mL: Systemic infection (sepsis) is not likely. Local bacterial infection is possible. (NOTE)         ICU PCT Algorithm               Non ICU PCT Algorithm    ----------------------------     ------------------------------         PCT < 0.25 ng/mL                 PCT < 0.1 ng/mL     Stopping of antibiotics            Stopping of antibiotics       strongly encouraged.               strongly encouraged.    ----------------------------     ------------------------------       PCT level decrease by               PCT < 0.25 ng/mL       >= 80% from peak PCT       OR PCT 0.25 - 0.5 ng/mL          Stopping of antibiotics                                             encouraged.     Stopping of antibiotics           encouraged.    ----------------------------     ------------------------------       PCT level decrease by              PCT >= 0.25 ng/mL       < 80% from peak PCT        AND PCT >= 0.5 ng/mL            Continuin g antibiotics                                              encouraged.       Continuing antibiotics            encouraged.    ----------------------------     ------------------------------     PCT level increase compared          PCT > 0.5 ng/mL         with peak PCT AND          PCT >= 0.5 ng/mL             Escalation of antibiotics                                          strongly encouraged.      Escalation of antibiotics  strongly encouraged.   Protime-INR     Status: None   Collection Time: 01/29/16 12:41 AM  Result Value Ref Range   Prothrombin Time 14.4 11.4 - 15.2 seconds   INR 1.11   APTT      Status: None   Collection Time: 01/29/16 12:41 AM  Result Value Ref Range   aPTT 30 24 - 36 seconds  Basic metabolic panel     Status: Abnormal   Collection Time: 01/29/16  3:24 AM  Result Value Ref Range   Sodium 136 135 - 145 mmol/L   Potassium 3.5 3.5 - 5.1 mmol/L   Chloride 103 101 - 111 mmol/L   CO2 27 22 - 32 mmol/L   Glucose, Bld 130 (H) 65 - 99 mg/dL   BUN 12 6 - 20 mg/dL   Creatinine, Ser 1.36 (H) 0.61 - 1.24 mg/dL   Calcium 8.5 (L) 8.9 - 10.3 mg/dL   GFR calc non Af Amer 59 (L) >60 mL/min   GFR calc Af Amer >60 >60 mL/min    Comment: (NOTE) The eGFR has been calculated using the CKD EPI equation. This calculation has not been validated in all clinical situations. eGFR's persistently <60 mL/min signify possible Chronic Kidney Disease.    Anion gap 6 5 - 15  CBC     Status: Abnormal   Collection Time: 01/29/16  3:24 AM  Result Value Ref Range   WBC 17.3 (H) 4.0 - 10.5 K/uL   RBC 4.71 4.22 - 5.81 MIL/uL   Hemoglobin 13.6 13.0 - 17.0 g/dL   HCT 40.9 39.0 - 52.0 %   MCV 86.8 78.0 - 100.0 fL   MCH 28.9 26.0 - 34.0 pg   MCHC 33.3 30.0 - 36.0 g/dL   RDW 14.6 11.5 - 15.5 %   Platelets 216 150 - 400 K/uL  Lactic acid, plasma     Status: None   Collection Time: 01/29/16  3:24 AM  Result Value Ref Range   Lactic Acid, Venous 0.8 0.5 - 1.9 mmol/L  Glucose, capillary     Status: Abnormal   Collection Time: 01/29/16  3:58 AM  Result Value Ref Range   Glucose-Capillary 119 (H) 65 - 99 mg/dL  Glucose, capillary     Status: Abnormal   Collection Time: 01/29/16  7:50 AM  Result Value Ref Range   Glucose-Capillary 116 (H) 65 - 99 mg/dL  Glucose, capillary     Status: Abnormal   Collection Time: 01/29/16 11:40 AM  Result Value Ref Range   Glucose-Capillary 103 (H) 65 - 99 mg/dL  Glucose, capillary     Status: Abnormal   Collection Time: 01/29/16  4:34 PM  Result Value Ref Range   Glucose-Capillary 103 (H) 65 - 99 mg/dL  Glucose, capillary     Status: Abnormal    Collection Time: 01/29/16  7:23 PM  Result Value Ref Range   Glucose-Capillary 121 (H) 65 - 99 mg/dL  Glucose, capillary     Status: Abnormal   Collection Time: 01/29/16 11:48 PM  Result Value Ref Range   Glucose-Capillary 111 (H) 65 - 99 mg/dL  Glucose, capillary     Status: Abnormal   Collection Time: 01/30/16  3:43 AM  Result Value Ref Range   Glucose-Capillary 111 (H) 65 - 99 mg/dL  Glucose, capillary     Status: Abnormal   Collection Time: 01/30/16  8:08 AM  Result Value Ref Range   Glucose-Capillary 110 (H) 65 - 99 mg/dL  CBC  Status: Abnormal   Collection Time: 01/30/16  8:47 AM  Result Value Ref Range   WBC 15.2 (H) 4.0 - 10.5 K/uL   RBC 4.78 4.22 - 5.81 MIL/uL   Hemoglobin 14.0 13.0 - 17.0 g/dL   HCT 41.4 39.0 - 52.0 %   MCV 86.6 78.0 - 100.0 fL   MCH 29.3 26.0 - 34.0 pg   MCHC 33.8 30.0 - 36.0 g/dL   RDW 14.2 11.5 - 15.5 %   Platelets 218 150 - 400 K/uL  Basic metabolic panel     Status: Abnormal   Collection Time: 01/30/16  8:47 AM  Result Value Ref Range   Sodium 138 135 - 145 mmol/L   Potassium 3.6 3.5 - 5.1 mmol/L   Chloride 103 101 - 111 mmol/L   CO2 28 22 - 32 mmol/L   Glucose, Bld 112 (H) 65 - 99 mg/dL   BUN 10 6 - 20 mg/dL   Creatinine, Ser 1.24 0.61 - 1.24 mg/dL   Calcium 8.7 (L) 8.9 - 10.3 mg/dL   GFR calc non Af Amer >60 >60 mL/min   GFR calc Af Amer >60 >60 mL/min    Comment: (NOTE) The eGFR has been calculated using the CKD EPI equation. This calculation has not been validated in all clinical situations. eGFR's persistently <60 mL/min signify possible Chronic Kidney Disease.    Anion gap 7 5 - 15  Glucose, capillary     Status: Abnormal   Collection Time: 01/30/16 12:00 PM  Result Value Ref Range   Glucose-Capillary 110 (H) 65 - 99 mg/dL    Dg Abd 1 View  Result Date: 01/28/2016 CLINICAL DATA:  50 year old male with abdominal pain and fever for 1 day. Initial encounter. EXAM: ABDOMEN - 1 VIEW COMPARISON:  CTA abdomen by femoral exam  02/25/2013. FINDINGS: Lung bases not included. The upper abdomen is not entirely included. Visualized bowel gas pattern is non obstructed. There is a left iliac artery vascular stent. Degenerative changes at both hips. No acute osseous abnormality identified. IMPRESSION: 1. Normal bowel gas pattern. 2. Left iliac artery vascular stent in place. Electronically Signed   By: Genevie Ann M.D.   On: 01/28/2016 14:19   Ct Abdomen Pelvis W Contrast  Result Date: 01/28/2016 CLINICAL DATA:  Acute onset of constipation followed by diarrhea. Lower abdominal pain and nausea. Initial encounter. EXAM: CT ABDOMEN AND PELVIS WITH CONTRAST TECHNIQUE: Multidetector CT imaging of the abdomen and pelvis was performed using the standard protocol following bolus administration of intravenous contrast. CONTRAST:  171m ISOVUE-300 IOPAMIDOL (ISOVUE-300) INJECTION 61% COMPARISON:  CT of the abdomen and pelvis from 02/25/2013 FINDINGS: Lower chest: Minimal right basilar atelectasis is noted. The visualized portions of the mediastinum are unremarkable. Hepatobiliary: The liver is unremarkable in appearance. The gallbladder is unremarkable in appearance. The common bile duct remains normal in caliber. Pancreas: The pancreas is within normal limits. Spleen: The spleen is unremarkable in appearance. Adrenals/Urinary Tract: The adrenal glands are unremarkable in appearance. The kidneys are within normal limits. There is no evidence of hydronephrosis. No renal or ureteral stones are identified. No perinephric stranding is seen. Stomach/Bowel: The stomach is unremarkable in appearance. The small bowel is within normal limits. The appendix is normal in caliber, without evidence of appendicitis. There is minimal diverticulosis at the mid sigmoid colon, with surrounding soft tissue inflammation and trace fluid, reflecting acute diverticulitis. A trace amount of air is seen extending along the mesentery, compatible with microperforation. There is no  evidence of abscess  formation at this time. Soft tissue inflammation extends proximally nearly to the level of the aortic bifurcation. Vascular/Lymphatic: Minimal calcification is seen at the distal abdominal aorta and its branches. Reproductive: The bladder is mildly distended and grossly unremarkable. The prostate remains normal in size. Other: No additional soft tissue abnormalities are seen. Musculoskeletal: No acute osseous abnormalities are identified. The visualized musculature is unremarkable in appearance. IMPRESSION: Acute diverticulitis at the mid sigmoid colon, with surrounding soft tissue inflammation and trace fluid. Trace air extending along the mesentery, compatible with microperforation. No evidence of abscess formation at this time. Soft tissue inflammation extends proximally nearly to the level of the aortic bifurcation. These results were called by telephone at the time of interpretation on 01/28/2016 at 8:05 pm to Dr. Theotis Burrow, who verbally acknowledged these results. Electronically Signed   By: Garald Balding M.D.   On: 01/28/2016 20:07    Review of Systems  Constitutional: Positive for fever and malaise/fatigue. Negative for chills.  HENT: Negative for congestion.   Eyes: Negative for blurred vision and double vision.  Respiratory: Negative for cough and shortness of breath.   Cardiovascular: Negative for chest pain and palpitations.  Gastrointestinal: Positive for abdominal pain and constipation. Negative for nausea and vomiting.  Genitourinary: Negative for dysuria, frequency and urgency.  Skin: Negative for itching and rash.  Neurological: Negative for headaches.   Blood pressure (!) 141/71, pulse 85, temperature 99.1 F (37.3 C), temperature source Oral, resp. rate 18, height 6' (1.829 m), weight (!) 172.4 kg (380 lb), SpO2 98 %. Physical Exam  Constitutional: He is oriented to person, place, and time. He appears well-developed and well-nourished. No distress.  HENT:   Head: Normocephalic and atraumatic.  Eyes: Conjunctivae are normal. Pupils are equal, round, and reactive to light. No scleral icterus.  Neck: Normal range of motion. Neck supple.  Cardiovascular: Normal rate and regular rhythm.   Respiratory: Effort normal. No respiratory distress.  GI: Soft. He exhibits no distension. There is tenderness.  LLQ  Musculoskeletal: Normal range of motion. He exhibits no edema.  Neurological: He is alert and oriented to person, place, and time.  Skin: Skin is warm and dry. He is not diaphoretic.    Assessment/Plan: Pt's pain is improving on IV antibiotics.  Start clears today.  Recheck wbc in AM.  Cont IV antibiotics.  ,  C. 14/06/8183, 63:14 PM

## 2016-01-30 NOTE — Progress Notes (Signed)
[facility]  PROGRESS NOTE  Carlos Phillips [id number] DOB: 11-06-1965 DOA: 01/28/2016 PCP: Wendie Agreste, MD   Brief HPI:   50 year old male with medical history of morbid obesity, peripheral vascular disease, hypertension, hyperlipidemia, DVT status post embolectomy in 2014, diabetes mellitus came with abdominal pain and fever.   CT scan abdomen pelvis was obtained which showed acute diverticulitis at the mid sigmoid colon, with surrounding softtissue inflammation and trace fluid. Trace air extending along themesentery, compatible with microperforation. No evidence of abscessformation at this time. Soft tissue inflammation extends proximallynearly to the level of the aortic bifurcation. Patient started on IV Zosyn/    Subjective   This morning patient feels much better. Says the pain is almost gone.    Assessment/Plan:     1. Sigmoid diverticulitis- CT scan showed acute diverticulitis at the mid sigmoid colon with surrounding soft tissue inflammation in place fluid. Trace air extending along the mesentery compatible with microperforation. Continue IV Zosyn. Patient will be kept nothing by mouth. Continue IV fluids Ringer lactate at 125 mL per hour.  Called and discussed with general surgery, will start a liquid diet today. Surgery will follow the patient as outpatient.  2. Type 2 diabetes mellitus- blood glucose is well controlled, continue sliding scale insulin with NovoLog. 3. Hypertension-blood pressure is well controlled, continue amlodipine, Catapres, HCTZ. 4. Hyperlipidemia-continue atorvastatin 5. Peripheral vascular disease-continue Plavix, patient has left iliac artery stent in place.     DVT prophylaxis: Lovenox  Code Status: Full code  Family Communication: Discussed with patient's wife at bedside   Disposition Plan: Home when medically stable   Consultants:  None  Procedures:  None  Antibiotics:   Anti-infectives    Start     Dose/Rate  Route Frequency Ordered Stop   01/29/16 0400  piperacillin-tazobactam (ZOSYN) IVPB 3.375 g     3.375 g 12.5 mL/hr over 240 Minutes Intravenous Every 8 hours 01/29/16 0017     01/28/16 2015  piperacillin-tazobactam (ZOSYN) IVPB 3.375 g     3.375 g 100 mL/hr over 30 Minutes Intravenous  Once 01/28/16 2008 01/28/16 2237       Objective   Vitals:   01/29/16 1507 01/29/16 1900 01/29/16 2100 01/30/16 0524  BP: 137/65  (!) 106/52 (!) 141/71  Pulse: 82  80 85  Resp: 16  16 18   Temp: (!) 100.5 F (38.1 C) (!) 100.7 F (38.2 C) 98.8 F (37.1 C) 99.1 F (37.3 C)  TempSrc: Oral  Oral Oral  SpO2: 100%  96% 98%  Weight:      Height:        Intake/Output Summary (Last 24 hours) at 01/30/16 1440 Last data filed at 01/30/16 1100  Gross per 24 hour  Intake             2725 ml  Output             2200 ml  Net              525 ml   Filed Weights   01/28/16 2350  Weight: (!) 172.4 kg (380 lb)     Physical Examination:  General exam: Appears calm and comfortable. Respiratory system: Clear to auscultation. Respiratory effort normal. Cardiovascular system:  RRR. No  murmurs, rubs, gallops. No pedal edema. GI system: Abdomen is nondistended, soft Mild tenderness in left lower quadrant. No organomegaly.  Central nervous system. No focal neurological deficits. 5 x 5 power in all extremities. Skin: No rashes, lesions or ulcers. Psychiatry: Alert,  oriented x 3.Judgement and insight appear normal. Affect normal.    Data Reviewed: I have personally reviewed following labs and imaging studies  CBG:  Recent Labs Lab 01/29/16 1923 01/29/16 2348 01/30/16 0343 01/30/16 0808 01/30/16 1200  GLUCAP 121* 111* 111* 110* 110*    CBC:  Recent Labs Lab 01/28/16 1427 01/28/16 1658 01/29/16 0324 01/30/16 0847  WBC 17.4* 18.2* 17.3* 15.2*  NEUTROABS  --  13.4*  --   --   HGB 15.8 15.8 13.6 14.0  HCT 46.9 46.2 40.9 41.4  MCV 86.3 86.4 86.8 86.6  PLT  --  251 216 218    Basic  Metabolic Panel:  Recent Labs Lab 01/28/16 1408 01/28/16 1710 01/29/16 0324 01/30/16 0847  NA 136 134* 136 138  K 4.1 3.4* 3.5 3.6  CL 99 101 103 103  CO2 24 22 27 28   GLUCOSE 113* 103* 130* 112*  BUN 13 13 12 10   CREATININE 1.44* 1.28* 1.36* 1.24  CALCIUM 9.6 9.4 8.5* 8.7*    Recent Results (from the past 240 hour(s))  Urine culture     Status: Abnormal   Collection Time: 01/28/16  5:00 PM  Result Value Ref Range Status   Specimen Description URINE, CLEAN CATCH  Final   Special Requests NONE  Final   Culture (A)  Final    <10,000 COLONIES/mL INSIGNIFICANT GROWTH Performed at [facility]    Report Status 01/30/2016 FINAL  Final  Culture, blood (routine x 2)     Status: None (Preliminary result)   Collection Time: 01/28/16  5:11 PM  Result Value Ref Range Status   Specimen Description BLOOD RIGHT ANTECUBITAL  Final   Special Requests BOTTLES DRAWN AEROBIC AND ANAEROBIC 5 CC EA  Final   Culture   Final    NO GROWTH 2 DAYS Performed at [facility]    Report Status PENDING  Incomplete  Culture, blood (routine x 2)     Status: None (Preliminary result)   Collection Time: 01/29/16 12:40 AM  Result Value Ref Range Status   Specimen Description BLOOD RIGHT ARM  Final   Special Requests BOTTLES DRAWN AEROBIC AND ANAEROBIC 5CC EACH  Final   Culture   Final    NO GROWTH 1 DAY Performed at [facility]    Report Status PENDING  Incomplete     Liver Function Tests:  Recent Labs Lab 01/28/16 1408 01/28/16 1710  AST 16 18  ALT 16 16*  ALKPHOS 69 66  BILITOT 0.8 1.3*  PROT 7.0 7.3  ALBUMIN 4.1 4.0    Recent Labs Lab 01/28/16 1710  LIPASE 16   No results for input(s): AMMONIA in the last 168 hours.  Cardiac Enzymes: No results for input(s): CKTOTAL, CKMB, CKMBINDEX, TROPONINI in the last 168 hours. BNP (last 3 results) No results for input(s): BNP in the last 8760 hours.  ProBNP (last 3 results) No results for input(s): PROBNP in the  last 8760 hours.    Studies: Ct Abdomen Pelvis W Contrast  Result Date: 01/28/2016 CLINICAL DATA:  Acute onset of constipation followed by diarrhea. Lower abdominal pain and nausea. Initial encounter. EXAM: CT ABDOMEN AND PELVIS WITH CONTRAST TECHNIQUE: Multidetector CT imaging of the abdomen and pelvis was performed using the standard protocol following bolus administration of intravenous contrast. CONTRAST:  167mL ISOVUE-300 IOPAMIDOL (ISOVUE-300) INJECTION 61% COMPARISON:  CT of the abdomen and pelvis from 02/25/2013 FINDINGS: Lower chest: Minimal right basilar atelectasis is noted. The visualized portions of the mediastinum  are unremarkable. Hepatobiliary: The liver is unremarkable in appearance. The gallbladder is unremarkable in appearance. The common bile duct remains normal in caliber. Pancreas: The pancreas is within normal limits. Spleen: The spleen is unremarkable in appearance. Adrenals/Urinary Tract: The adrenal glands are unremarkable in appearance. The kidneys are within normal limits. There is no evidence of hydronephrosis. No renal or ureteral stones are identified. No perinephric stranding is seen. Stomach/Bowel: The stomach is unremarkable in appearance. The small bowel is within normal limits. The appendix is normal in caliber, without evidence of appendicitis. There is minimal diverticulosis at the mid sigmoid colon, with surrounding soft tissue inflammation and trace fluid, reflecting acute diverticulitis. A trace amount of air is seen extending along the mesentery, compatible with microperforation. There is no evidence of abscess formation at this time. Soft tissue inflammation extends proximally nearly to the level of the aortic bifurcation. Vascular/Lymphatic: Minimal calcification is seen at the distal abdominal aorta and its branches. Reproductive: The bladder is mildly distended and grossly unremarkable. The prostate remains normal in size. Other: No additional soft tissue  abnormalities are seen. Musculoskeletal: No acute osseous abnormalities are identified. The visualized musculature is unremarkable in appearance. IMPRESSION: Acute diverticulitis at the mid sigmoid colon, with surrounding soft tissue inflammation and trace fluid. Trace air extending along the mesentery, compatible with microperforation. No evidence of abscess formation at this time. Soft tissue inflammation extends proximally nearly to the level of the aortic bifurcation. These results were called by telephone at the time of interpretation on 01/28/2016 at 8:05 pm to Dr. Theotis Burrow, who verbally acknowledged these results. Electronically Signed   By: Garald Balding M.D.   On: 01/28/2016 20:07    Scheduled Meds: . amLODipine  10 mg Oral Daily  . atorvastatin  10 mg Oral q1800  . cloNIDine  0.1 mg Oral BID  . clopidogrel  75 mg Oral Q breakfast  . enoxaparin (LOVENOX) injection  80 mg Subcutaneous Daily  . hydrochlorothiazide  25 mg Oral Daily  . insulin aspart  0-20 Units Subcutaneous Q4H  . piperacillin-tazobactam  3.375 g Intravenous Q8H    Continuous Infusions: . lactated ringers 125 mL/hr at 01/30/16 0803    Time spent: 25 min  [facility] Pager [phone]. If 7PM-7AM, please contact night-coverage at [email or web address], Office  [phone]  password [email or web address] 01/30/2016, 2:40 PM  LOS: 2 days

## 2016-01-31 DIAGNOSIS — K5720 Diverticulitis of large intestine with perforation and abscess without bleeding: Secondary | ICD-10-CM

## 2016-01-31 DIAGNOSIS — A419 Sepsis, unspecified organism: Principal | ICD-10-CM

## 2016-01-31 LAB — COMPREHENSIVE METABOLIC PANEL
ALT: 17 U/L (ref 17–63)
AST: 18 U/L (ref 15–41)
Albumin: 3.3 g/dL — ABNORMAL LOW (ref 3.5–5.0)
Alkaline Phosphatase: 63 U/L (ref 38–126)
Anion gap: 9 (ref 5–15)
BUN: 9 mg/dL (ref 6–20)
CO2: 25 mmol/L (ref 22–32)
Calcium: 8.7 mg/dL — ABNORMAL LOW (ref 8.9–10.3)
Chloride: 104 mmol/L (ref 101–111)
Creatinine, Ser: 1.18 mg/dL (ref 0.61–1.24)
GFR calc Af Amer: >60 mL/min (ref >60)
GFR calc non Af Amer: >60 mL/min (ref >60)
Glucose, Bld: 114 mg/dL — ABNORMAL HIGH (ref 65–99)
Potassium: 3.3 mmol/L — ABNORMAL LOW (ref 3.5–5.1)
Sodium: 138 mmol/L (ref 135–145)
Total Bilirubin: 1.1 mg/dL (ref 0.3–1.2)
Total Protein: 6.6 g/dL (ref 6.5–8.1)

## 2016-01-31 LAB — CBC
HCT: 40.4 % (ref 39.0–52.0)
Hemoglobin: 13.4 g/dL (ref 13.0–17.0)
MCH: 28.6 pg (ref 26.0–34.0)
MCHC: 33.2 g/dL (ref 30.0–36.0)
MCV: 86.3 fL (ref 78.0–100.0)
Platelets: 249 10*3/uL (ref 150–400)
RBC: 4.68 MIL/uL (ref 4.22–5.81)
RDW: 14.1 % (ref 11.5–15.5)
WBC: 13.1 10*3/uL — ABNORMAL HIGH (ref 4.0–10.5)

## 2016-01-31 LAB — GLUCOSE, CAPILLARY
Glucose-Capillary: 107 mg/dL — ABNORMAL HIGH (ref 65–99)
Glucose-Capillary: 110 mg/dL — ABNORMAL HIGH (ref 65–99)
Glucose-Capillary: 115 mg/dL — ABNORMAL HIGH (ref 65–99)
Glucose-Capillary: 117 mg/dL — ABNORMAL HIGH (ref 65–99)
Glucose-Capillary: 124 mg/dL — ABNORMAL HIGH (ref 65–99)
Glucose-Capillary: 126 mg/dL — ABNORMAL HIGH (ref 65–99)
Glucose-Capillary: 140 mg/dL — ABNORMAL HIGH (ref 65–99)

## 2016-01-31 MED ORDER — INSULIN ASPART 100 UNIT/ML ~~LOC~~ SOLN: 0.0000 [IU] | Freq: Three times a day (TID) | SUBCUTANEOUS | Status: DC

## 2016-01-31 MED ORDER — POTASSIUM CHLORIDE CRYS ER 20 MEQ PO TBCR
40.0000 meq | EXTENDED_RELEASE_TABLET | Freq: Once | ORAL | Status: AC
  Administered 2016-01-31: 40 meq via ORAL
  Filled 2016-01-31: qty 2

## 2016-01-31 NOTE — Progress Notes (Signed)
[facility]  PROGRESS NOTE  Carlos Phillips [id number] DOB: 1966/02/08 DOA: 01/28/2016 PCP: Wendie Agreste, MD   Brief HPI:   50 year old male with medical history of morbid obesity, peripheral vascular disease, hypertension, hyperlipidemia, DVT status post embolectomy in 2014, diabetes mellitus came with abdominal pain and fever.   CT scan abdomen pelvis was obtained which showed acute diverticulitis at the mid sigmoid colon, with surrounding softtissue inflammation and trace fluid. Trace air extending along themesentery, compatible with microperforation. No evidence of abscessformation at this time. Soft tissue inflammation extends proximallynearly to the level of the aortic bifurcation. Patient started on IV Zosyn/    Subjective   This morning patient feels much better. Says the pain is almost gone. Patient was started on clear liquid diet yesterday, general surgery recommended stool change to soft diet   Assessment/Plan:     1. Sigmoid diverticulitis- CT scan showed acute diverticulitis at the mid sigmoid colon with surrounding soft tissue inflammation in place fluid. Trace air extending along the mesentery compatible with microperforation. Continue IV Zosyn. Patient will be kept nothing by mouth. Continue IV fluids Ringer lactate at 125 mL per hour.  Called and discussed with general surgery, will start Soft diet today. Surgery will follow the patient as outpatient. If WBC improved tomorrow, discharge patient home on total 2 weeks of antibiotics. 2. Type 2 diabetes mellitus- blood glucose is well controlled, continue sliding scale insulin with NovoLog. 3. Hypertension-blood pressure is well controlled, continue amlodipine, Catapres, HCTZ. 4. Hyperlipidemia-continue atorvastatin 5. Hypokalemia- replace potassium and check BMP in a.m. 6. Peripheral vascular disease-continue Plavix, patient has left iliac artery stent in place.     DVT prophylaxis: Lovenox  Code  Status: Full code  Family Communication: Discussed with patient's wife at bedside   Disposition Plan: Home when medically stable   Consultants:  None  Procedures:  None  Antibiotics:   Anti-infectives    Start     Dose/Rate Route Frequency Ordered Stop   01/29/16 0400  piperacillin-tazobactam (ZOSYN) IVPB 3.375 g     3.375 g 12.5 mL/hr over 240 Minutes Intravenous Every 8 hours 01/29/16 0017     01/28/16 2015  piperacillin-tazobactam (ZOSYN) IVPB 3.375 g     3.375 g 100 mL/hr over 30 Minutes Intravenous  Once 01/28/16 2008 01/28/16 2237       Objective   Vitals:   01/30/16 0524 01/30/16 2104 01/31/16 0520 01/31/16 0949  BP: (!) 141/71 120/73 134/78 136/89  Pulse: 85 77 80   Resp: 18 18 18    Temp: 99.1 F (37.3 C) 98.8 F (37.1 C) 98.7 F (37.1 C)   TempSrc: Oral Oral Oral   SpO2: 98% 98% 98%   Weight:      Height:        Intake/Output Summary (Last 24 hours) at 01/31/16 1245 Last data filed at 01/31/16 0603  Gross per 24 hour  Intake          3297.92 ml  Output             1450 ml  Net          1847.92 ml   Filed Weights   01/28/16 2350  Weight: (!) 172.4 kg (380 lb)     Physical Examination:  General exam: Appears calm and comfortable. Respiratory system: Clear to auscultation. Respiratory effort normal. Cardiovascular system:  RRR. No  murmurs, rubs, gallops. No pedal edema. GI system: Abdomen is nondistended, soft, Nontender to palpation. No organomegaly.  Central nervous system.  No focal neurological deficits. 5 x 5 power in all extremities. Skin: No rashes, lesions or ulcers. Psychiatry: Alert, oriented x 3.Judgement and insight appear normal. Affect normal.    Data Reviewed: I have personally reviewed following labs and imaging studies  CBG:  Recent Labs Lab 01/30/16 1955 01/30/16 2346 01/31/16 0518 01/31/16 0741 01/31/16 1209  GLUCAP 121* 102* 107* 140* 126*    CBC:  Recent Labs Lab 01/28/16 1427 01/28/16 1658  01/29/16 0324 01/30/16 0847 01/31/16 0433  WBC 17.4* 18.2* 17.3* 15.2* 13.1*  NEUTROABS  --  13.4*  --   --   --   HGB 15.8 15.8 13.6 14.0 13.4  HCT 46.9 46.2 40.9 41.4 40.4  MCV 86.3 86.4 86.8 86.6 86.3  PLT  --  251 216 218 0000000    Basic Metabolic Panel:  Recent Labs Lab 01/28/16 1408 01/28/16 1710 01/29/16 0324 01/30/16 0847 01/31/16 0433  NA 136 134* 136 138 138  K 4.1 3.4* 3.5 3.6 3.3*  CL 99 101 103 103 104  CO2 24 22 27 28 25   GLUCOSE 113* 103* 130* 112* 114*  BUN 13 13 12 10 9   CREATININE 1.44* 1.28* 1.36* 1.24 1.18  CALCIUM 9.6 9.4 8.5* 8.7* 8.7*    Recent Results (from the past 240 hour(s))  Urine culture     Status: Abnormal   Collection Time: 01/28/16  5:00 PM  Result Value Ref Range Status   Specimen Description URINE, CLEAN CATCH  Final   Special Requests NONE  Final   Culture (A)  Final    <10,000 COLONIES/mL INSIGNIFICANT GROWTH Performed at [facility]    Report Status 01/30/2016 FINAL  Final  Culture, blood (routine x 2)     Status: None (Preliminary result)   Collection Time: 01/28/16  5:11 PM  Result Value Ref Range Status   Specimen Description BLOOD RIGHT ANTECUBITAL  Final   Special Requests BOTTLES DRAWN AEROBIC AND ANAEROBIC 5 CC EA  Final   Culture   Final    NO GROWTH 3 DAYS Performed at [facility]    Report Status PENDING  Incomplete  Culture, blood (routine x 2)     Status: None (Preliminary result)   Collection Time: 01/29/16 12:40 AM  Result Value Ref Range Status   Specimen Description BLOOD RIGHT ARM  Final   Special Requests BOTTLES DRAWN AEROBIC AND ANAEROBIC 5CC EACH  Final   Culture   Final    NO GROWTH 2 DAYS Performed at [facility]    Report Status PENDING  Incomplete     Liver Function Tests:  Recent Labs Lab 01/28/16 1408 01/28/16 1710 01/31/16 0433  AST 16 18 18   ALT 16 16* 17  ALKPHOS 69 66 63  BILITOT 0.8 1.3* 1.1  PROT 7.0 7.3 6.6  ALBUMIN 4.1 4.0 3.3*    Recent  Labs Lab 01/28/16 1710  LIPASE 16   No results for input(s): AMMONIA in the last 168 hours.  Cardiac Enzymes: No results for input(s): CKTOTAL, CKMB, CKMBINDEX, TROPONINI in the last 168 hours. BNP (last 3 results) No results for input(s): BNP in the last 8760 hours.  ProBNP (last 3 results) No results for input(s): PROBNP in the last 8760 hours.    Studies: No results found.  Scheduled Meds: . amLODipine  10 mg Oral Daily  . atorvastatin  10 mg Oral q1800  . cloNIDine  0.1 mg Oral BID  . clopidogrel  75 mg Oral Q breakfast  .  enoxaparin (LOVENOX) injection  80 mg Subcutaneous Daily  . hydrochlorothiazide  25 mg Oral Daily  . insulin aspart  0-20 Units Subcutaneous Q4H  . piperacillin-tazobactam  3.375 g Intravenous Q8H    Continuous Infusions: . lactated ringers 100 mL/hr at 01/31/16 1015    Time spent: 25 min  [facility] Pager [phone]. If 7PM-7AM, please contact night-coverage at [email or web address], Office  [phone]  password [place] 01/31/2016, 12:45 PM  LOS: 3 days

## 2016-01-31 NOTE — Progress Notes (Signed)
Diverticulitis  Subjective: Feeling better, had a BM, tolerated clears  Objective: Vital signs in last 24 hours: Temp:  [98.7 F (37.1 C)-98.8 F (37.1 C)] 98.7 F (37.1 C) (11/05 0520) Pulse Rate:  [77-80] 80 (11/05 0520) Resp:  [18] 18 (11/05 0520) BP: (120-134)/(73-78) 134/78 (11/05 0520) SpO2:  [98 %] 98 % (11/05 0520) Last BM Date: 01/31/16  Intake/Output from previous day: 11/04 0701 - 11/05 0700 In: 3931.3 [P.O.:1600; I.V.:2131.3; IV Piggyback:200] Out: 1850 [Urine:1850] Intake/Output this shift: No intake/output data recorded.  General appearance: alert and cooperative GI: soft, non-distended   Lab Results:  Results for orders placed or performed during the hospital encounter of 01/28/16 (from the past 24 hour(s))  Glucose, capillary     Status: Abnormal   Collection Time: 01/30/16 12:00 PM  Result Value Ref Range   Glucose-Capillary 110 (H) 65 - 99 mg/dL  Glucose, capillary     Status: Abnormal   Collection Time: 01/30/16  4:54 PM  Result Value Ref Range   Glucose-Capillary 111 (H) 65 - 99 mg/dL  Glucose, capillary     Status: Abnormal   Collection Time: 01/30/16  7:55 PM  Result Value Ref Range   Glucose-Capillary 121 (H) 65 - 99 mg/dL  Glucose, capillary     Status: Abnormal   Collection Time: 01/30/16 11:46 PM  Result Value Ref Range   Glucose-Capillary 102 (H) 65 - 99 mg/dL  CBC     Status: Abnormal   Collection Time: 01/31/16  4:33 AM  Result Value Ref Range   WBC 13.1 (H) 4.0 - 10.5 K/uL   RBC 4.68 4.22 - 5.81 MIL/uL   Hemoglobin 13.4 13.0 - 17.0 g/dL   HCT 40.4 39.0 - 52.0 %   MCV 86.3 78.0 - 100.0 fL   MCH 28.6 26.0 - 34.0 pg   MCHC 33.2 30.0 - 36.0 g/dL   RDW 14.1 11.5 - 15.5 %   Platelets 249 150 - 400 K/uL  Comprehensive metabolic panel     Status: Abnormal   Collection Time: 01/31/16  4:33 AM  Result Value Ref Range   Sodium 138 135 - 145 mmol/L   Potassium 3.3 (L) 3.5 - 5.1 mmol/L   Chloride 104 101 - 111 mmol/L   CO2 25 22 - 32  mmol/L   Glucose, Bld 114 (H) 65 - 99 mg/dL   BUN 9 6 - 20 mg/dL   Creatinine, Ser 1.18 0.61 - 1.24 mg/dL   Calcium 8.7 (L) 8.9 - 10.3 mg/dL   Total Protein 6.6 6.5 - 8.1 g/dL   Albumin 3.3 (L) 3.5 - 5.0 g/dL   AST 18 15 - 41 U/L   ALT 17 17 - 63 U/L   Alkaline Phosphatase 63 38 - 126 U/L   Total Bilirubin 1.1 0.3 - 1.2 mg/dL   GFR calc non Af Amer >60 >60 mL/min   GFR calc Af Amer >60 >60 mL/min   Anion gap 9 5 - 15  Glucose, capillary     Status: Abnormal   Collection Time: 01/31/16  5:18 AM  Result Value Ref Range   Glucose-Capillary 107 (H) 65 - 99 mg/dL  Glucose, capillary     Status: Abnormal   Collection Time: 01/31/16  7:41 AM  Result Value Ref Range   Glucose-Capillary 140 (H) 65 - 99 mg/dL     Studies/Results Radiology     MEDS, Scheduled . amLODipine  10 mg Oral Daily  . atorvastatin  10 mg Oral q1800  . cloNIDine  0.1 mg Oral BID  . clopidogrel  75 mg Oral Q breakfast  . enoxaparin (LOVENOX) injection  80 mg Subcutaneous Daily  . hydrochlorothiazide  25 mg Oral Daily  . insulin aspart  0-20 Units Subcutaneous Q4H  . piperacillin-tazobactam  3.375 g Intravenous Q8H  . potassium chloride  40 mEq Oral Once     Assessment: Sepsis ([place]) Diverticulitis with microperf  Plan: Advance to soft diet Cont Abx, ok to switch to PO for a total of 2 weeks Recheck WBC in AM Follow up with me in 3-4 weeks.   LOS: 3 days    Rosario Adie, MD [facility], [place]   01/31/2016 9:39 AM

## 2016-02-01 ENCOUNTER — Encounter (INDEPENDENT_AMBULATORY_CARE_PROVIDER_SITE_OTHER): Payer: Self-pay | Admitting: General Surgery

## 2016-02-01 LAB — CBC
HCT: 41.9 % (ref 39.0–52.0)
Hemoglobin: 14.2 g/dL (ref 13.0–17.0)
MCH: 29 pg (ref 26.0–34.0)
MCHC: 33.9 g/dL (ref 30.0–36.0)
MCV: 85.5 fL (ref 78.0–100.0)
Platelets: 279 10*3/uL (ref 150–400)
RBC: 4.9 MIL/uL (ref 4.22–5.81)
RDW: 14 % (ref 11.5–15.5)
WBC: 10.9 10*3/uL — ABNORMAL HIGH (ref 4.0–10.5)

## 2016-02-01 LAB — BASIC METABOLIC PANEL
Anion gap: 8 (ref 5–15)
BUN: 10 mg/dL (ref 6–20)
CO2: 24 mmol/L (ref 22–32)
Calcium: 9 mg/dL (ref 8.9–10.3)
Chloride: 106 mmol/L (ref 101–111)
Creatinine, Ser: 1.18 mg/dL (ref 0.61–1.24)
GFR calc Af Amer: >60 mL/min (ref >60)
GFR calc non Af Amer: >60 mL/min (ref >60)
Glucose, Bld: 122 mg/dL — ABNORMAL HIGH (ref 65–99)
Potassium: 3.6 mmol/L (ref 3.5–5.1)
Sodium: 138 mmol/L (ref 135–145)

## 2016-02-01 LAB — GLUCOSE, CAPILLARY: Glucose-Capillary: 116 mg/dL — ABNORMAL HIGH (ref 65–99)

## 2016-02-01 MED ORDER — AMOXICILLIN-POT CLAVULANATE 875-125 MG PO TABS: 1.0000 | ORAL_TABLET | Freq: Two times a day (BID) | ORAL | 0 refills | Status: DC

## 2016-02-01 NOTE — Discharge Instructions (Signed)
Diverticulitis °Diverticulitis is inflammation or infection of small pouches in your colon that form when you have a condition called diverticulosis. The pouches in your colon are called diverticula. Your colon, or large intestine, is where water is absorbed and stool is formed. °Complications of diverticulitis can include: °· Bleeding. °· Severe infection. °· Severe pain. °· Perforation of your colon. °· Obstruction of your colon. °CAUSES  °Diverticulitis is caused by bacteria. °Diverticulitis happens when stool becomes trapped in diverticula. This allows bacteria to grow in the diverticula, which can lead to inflammation and infection. °RISK FACTORS °People with diverticulosis are at risk for diverticulitis. Eating a diet that does not include enough fiber from fruits and vegetables may make diverticulitis more likely to develop. °SYMPTOMS  °Symptoms of diverticulitis may include: °· Abdominal pain and tenderness. The pain is normally located on the left side of the abdomen, but may occur in other areas. °· Fever and chills. °· Bloating. °· Cramping. °· Nausea. °· Vomiting. °· Constipation. °· Diarrhea. °· Blood in your stool. °DIAGNOSIS  °Your health care provider will ask you about your medical history and do a physical exam. You may need to have tests done because many medical conditions can cause the same symptoms as diverticulitis. Tests may include: °· Blood tests. °· Urine tests. °· Imaging tests of the abdomen, including X-rays and CT scans. °When your condition is under control, your health care provider may recommend that you have a colonoscopy. A colonoscopy can show how severe your diverticula are and whether something else is causing your symptoms. °TREATMENT  °Most cases of diverticulitis are mild and can be treated at home. Treatment may include: °· Taking over-the-counter pain medicines. °· Following a clear liquid diet. °· Taking antibiotic medicines by mouth for 7-10 days. °More severe cases may  be treated at a hospital. Treatment may include: °· Not eating or drinking. °· Taking prescription pain medicine. °· Receiving antibiotic medicines through an IV tube. °· Receiving fluids and nutrition through an IV tube. °· Surgery. °HOME CARE INSTRUCTIONS  °· Follow your health care provider's instructions carefully. °· Follow a full liquid diet or other diet as directed by your health care provider. After your symptoms improve, your health care provider may tell you to change your diet. He or she may recommend you eat a high-fiber diet. Fruits and vegetables are good sources of fiber. Fiber makes it easier to pass stool. °· Take fiber supplements or probiotics as directed by your health care provider. °· Only take medicines as directed by your health care provider. °· Keep all your follow-up appointments. °SEEK MEDICAL CARE IF:  °· Your pain does not improve. °· You have a hard time eating food. °· Your bowel movements do not return to normal. °SEEK IMMEDIATE MEDICAL CARE IF:  °· Your pain becomes worse. °· Your symptoms do not get better. °· Your symptoms suddenly get worse. °· You have a fever. °· You have repeated vomiting. °· You have bloody or black, tarry stools. °MAKE SURE YOU:  °· Understand these instructions. °· Will watch your condition. °· Will get help right away if you are not doing well or get worse. °  °This information is not intended to replace advice given to you by your health care provider. Make sure you discuss any questions you have with your health care provider. °  °Document Released: 12/22/2004 Document Revised: 03/19/2013 Document Reviewed: 02/06/2013 °[employer] Interactive Patient Education ©2016 [employer] ° °

## 2016-02-01 NOTE — Progress Notes (Signed)
[facility] Progress Note     Subjective: Denies any abdominal pain. Tolerating soft diet. +flatus and having soft, non-bloody BMs. Denies nausea/vomiting. Ambulating in room.  Objective: Vital signs in last 24 hours: Temp:  [97.7 F (36.5 C)-99.4 F (37.4 C)] 97.7 F (36.5 C) (11/06 0434) Pulse Rate:  [74-78] 74 (11/06 0434) Resp:  [20] 20 (11/06 0434) BP: (125-136)/(79-93) 130/81 (11/06 0434) SpO2:  [100 %] 100 % (11/06 0434) Last BM Date: 01/31/16  Intake/Output from previous day: 11/05 0701 - 11/06 0700 In: 2606.3 [I.V.:2506.3; IV Piggyback:100] Out: 1450 [Urine:1450] Intake/Output this shift: No intake/output data recorded.  PE: Gen:  Alert, NAD, pleasant Card:  RRR, no M/G/R appreciated Pulm:  CTA, no W/R/R Abd: obese, Soft, NT/ND, +BS Ext:  No erythema, edema, or tenderness  Lab Results:   Recent Labs  01/31/16 0433 02/01/16 0438  WBC 13.1* 10.9*  HGB 13.4 14.2  HCT 40.4 41.9  PLT 249 279   BMET  Recent Labs  01/31/16 0433 02/01/16 0438  NA 138 138  K 3.3* 3.6  CL 104 106  CO2 25 24  GLUCOSE 114* 122*  BUN 9 10  CREATININE 1.18 1.18  CALCIUM 8.7* 9.0   CMP     Component Value Date/Time   NA 138 02/01/2016 0438   K 3.6 02/01/2016 0438   CL 106 02/01/2016 0438   CO2 24 02/01/2016 0438   GLUCOSE 122 (H) 02/01/2016 0438   BUN 10 02/01/2016 0438   CREATININE 1.18 02/01/2016 0438   CREATININE 1.44 (H) 01/28/2016 1408   CALCIUM 9.0 02/01/2016 0438   PROT 6.6 01/31/2016 0433   ALBUMIN 3.3 (L) 01/31/2016 0433   AST 18 01/31/2016 0433   ALT 17 01/31/2016 0433   ALKPHOS 63 01/31/2016 0433   BILITOT 1.1 01/31/2016 0433   GFRNONAA >60 02/01/2016 0438   GFRNONAA 72 06/21/2015 1143   GFRAA >60 02/01/2016 0438   GFRAA 83 06/21/2015 1143   Lipase     Component Value Date/Time   LIPASE 16 01/28/2016 1710   Anti-infectives: Anti-infectives    Start     Dose/Rate Route Frequency Ordered Stop   01/29/16 0400   piperacillin-tazobactam (ZOSYN) IVPB 3.375 g     3.375 g 12.5 mL/hr over 240 Minutes Intravenous Every 8 hours 01/29/16 0017     01/28/16 2015  piperacillin-tazobactam (ZOSYN) IVPB 3.375 g     3.375 g 100 mL/hr over 30 Minutes Intravenous  Once 01/28/16 2008 01/28/16 2237     Assessment/Plan Diverticulitis with microperf  Soft diet  WBC trending down - 10.9 from 13.1  PO Augmentin x 2 weeks  Follow up with Dr. Leighton Ruff in 3-4 weeks   Obesity PVD HTN HLD DVT s/p embolectomy 2014 DM  Dispo: stable for discharge from a surgical standpoint. Follow up as above.     LOS: 4 days    [facility] 02/01/2016, 8:29 AM Pager: [phone] Consults: [phone] Mon-Fri 7:00 am-4:30 pm Sat-Sun 7:00 am-11:30 am

## 2016-02-01 NOTE — Discharge Summary (Signed)
Physician Discharge Summary  Carlos Phillips BWI:[id number] DOB: 16-Apr-1965 DOA: 01/28/2016  PCP: Wendie Agreste, MD  Admit date: 01/28/2016 Discharge date: 02/01/2016  Time spent: 25 minutes  Recommendations for Outpatient Follow-up:  Follow up general surgery in 3 weeks Follow up PCP in  2 weeks  Discharge Diagnoses:  Principal Problem:   Sepsis ([place]) Active Problems:   HTN (hypertension)   Type 2 diabetes mellitus with diabetic dermatitis, without long-term current use of insulin (HCC)   Diverticulitis   PVD (peripheral vascular disease) ([place])   Discharge Condition: Stable  Diet recommendation: regular diet  Filed Weights   01/28/16 2350  Weight: (!) 172.4 kg (380 lb)    History of present illness:  50 year old male with medical history of morbid obesity, peripheral vascular disease, hypertension, hyperlipidemia, DVT status post embolectomy in 2014, diabetes mellitus came with abdominal pain and fever.   CT scan abdomen pelvis was obtained which showed acute diverticulitis at the mid sigmoid colon, with surrounding softtissue inflammation and trace fluid. Trace air extending along themesentery, compatible with microperforation. No evidence of abscessformation at this time. Soft tissue inflammation extends proximallynearly to the level of the aortic bifurcation. Patient started on IV Zosyn/  Hospital Course:  1. Sigmoid diverticulitis- CT scan showed acute diverticulitis at the mid sigmoid colon with surrounding soft tissue inflammation in place fluid. Trace air extending along the mesentery compatible with microperforation. Continue IV Zosyn. Patient was kept nothing by mouth. Continued on  IV fluids Ringer lactate at 125 mL per hour.  Was seen by general surgery, patient has improved, wbc is down to 10.9, tolerating diet well, no pain. Plan is to discharge home on  Augmentin 875 mg po bid for 2 weeks. Will follow up general surgery in 3 weeks as outpatient. 2. Type 2  diabetes mellitus- blood glucose is well controlled, continue Metformin 3. Hypertension-blood pressure is well controlled, continue amlodipine, Catapres, HCTZ. 4. Hyperlipidemia-continue atorvastatin 5. Hypokalemia- replaced 6. Peripheral vascular disease-continue Plavix, patient has left iliac artery stent in place.  Procedures:  None   Consultations:  Gen surgery  Discharge Exam: Vitals:   01/31/16 2011 02/01/16 0434  BP: (!) 127/93 130/81  Pulse: 77 74  Resp: 20 20  Temp: 99.4 F (37.4 C) 97.7 F (36.5 C)    General: Appears in no acute distress Cardiovascular: RRR, no murmur Respiratory: Clear bilaterally  Discharge Instructions   Discharge Instructions    Diet - low sodium heart healthy    Complete by:  As directed    Increase activity slowly    Complete by:  As directed      Current Discharge Medication List    START taking these medications   Details  amoxicillin-clavulanate (AUGMENTIN) 875-125 MG tablet Take 1 tablet by mouth 2 (two) times daily. Qty: 28 tablet, Refills: 0      CONTINUE these medications which have NOT CHANGED   Details  amLODipine (NORVASC) 10 MG tablet TAKE ONE TABLET BY MOUTH ONCE DAILY Qty: 30 tablet, Refills: 5   Associated Diagnoses: Essential hypertension    atorvastatin (LIPITOR) 10 MG tablet Take one tablet by mouth once daily. Qty: 30 tablet, Refills: 5   Associated Diagnoses: Hyperlipidemia    cloNIDine (CATAPRES) 0.1 MG tablet TAKE ONE TABLET BY MOUTH TWICE DAILY Qty: 60 tablet, Refills: 5   Associated Diagnoses: Essential hypertension    clopidogrel (PLAVIX) 75 MG tablet Take 1 tablet (75 mg total) by mouth daily with breakfast. Qty: 30 tablet, Refills: 5   Associated  Diagnoses: PVD (peripheral vascular disease) ([place])    hydrochlorothiazide (HYDRODIURIL) 25 MG tablet TAKE ONE TABLET BY MOUTH ONCE DAILY. Qty: 30 tablet, Refills: 5   Associated Diagnoses: Essential hypertension    metFORMIN (GLUCOPHAGE) 500 MG  tablet Take 1 tablet (500 mg total) by mouth daily with breakfast. Qty: 30 tablet, Refills: 5   Associated Diagnoses: Controlled type 2 diabetes mellitus without complication, without long-term current use of insulin (HCC)    Multiple Vitamins-Minerals (MULTIVITAMIN WITH MINERALS) tablet Take 1 tablet by mouth daily.    Blood Glucose Monitoring Suppl (GLUCOCOM BLOOD GLUCOSE MONITOR) DEVI 1 kit by Does not apply route once. Qty: 1 each, Refills: 0    glucose blood test strip To test blood sugar once per day. Dx 250.00.  Brand per insurance coverage. Qty: 100 each, Refills: 12   Associated Diagnoses: Controlled type 2 diabetes mellitus without complication, without long-term current use of insulin (HCC)    Lancet Device MISC To test blood sugar once per day. Dx 250.00.  Brand per insurance coverage. Qty: 100 each, Refills: 6   Associated Diagnoses: Controlled type 2 diabetes mellitus without complication, without long-term current use of insulin (HCC)    sildenafil (VIAGRA) 100 MG tablet Take 0.5-1 tablets (50-100 mg total) by mouth daily as needed for erectile dysfunction. Qty: 5 tablet, Refills: 5   Associated Diagnoses: Erectile dysfunction, unspecified erectile dysfunction type       Allergies  Allergen Reactions  . Morphine And Related Nausea And Vomiting   Follow-up Information    Rosario Adie., MD Follow up in 3 week(s).   Specialty:  General Surgery Contact information: [address] [place] [address] [phone]        Wendie Agreste, MD. Call in 2 week(s).   Specialties:  Family Medicine, Sports Medicine Contact information: [address] [place] [address] [phone]            The results of significant diagnostics from this hospitalization (including imaging, microbiology, ancillary and laboratory) are listed below for reference.    Significant Diagnostic Studies: Dg Abd 1 View  Result Date: 01/28/2016 CLINICAL DATA:   50 year old male with abdominal pain and fever for 1 day. Initial encounter. EXAM: ABDOMEN - 1 VIEW COMPARISON:  CTA abdomen by femoral exam 02/25/2013. FINDINGS: Lung bases not included. The upper abdomen is not entirely included. Visualized bowel gas pattern is non obstructed. There is a left iliac artery vascular stent. Degenerative changes at both hips. No acute osseous abnormality identified. IMPRESSION: 1. Normal bowel gas pattern. 2. Left iliac artery vascular stent in place. Electronically Signed   By: Genevie Ann M.D.   On: 01/28/2016 14:19   Ct Abdomen Pelvis W Contrast  Result Date: 01/28/2016 CLINICAL DATA:  Acute onset of constipation followed by diarrhea. Lower abdominal pain and nausea. Initial encounter. EXAM: CT ABDOMEN AND PELVIS WITH CONTRAST TECHNIQUE: Multidetector CT imaging of the abdomen and pelvis was performed using the standard protocol following bolus administration of intravenous contrast. CONTRAST:  141m ISOVUE-300 IOPAMIDOL (ISOVUE-300) INJECTION 61% COMPARISON:  CT of the abdomen and pelvis from 02/25/2013 FINDINGS: Lower chest: Minimal right basilar atelectasis is noted. The visualized portions of the mediastinum are unremarkable. Hepatobiliary: The liver is unremarkable in appearance. The gallbladder is unremarkable in appearance. The common bile duct remains normal in caliber. Pancreas: The pancreas is within normal limits. Spleen: The spleen is unremarkable in appearance. Adrenals/Urinary Tract: The adrenal glands are unremarkable in appearance. The kidneys are within normal  limits. There is no evidence of hydronephrosis. No renal or ureteral stones are identified. No perinephric stranding is seen. Stomach/Bowel: The stomach is unremarkable in appearance. The small bowel is within normal limits. The appendix is normal in caliber, without evidence of appendicitis. There is minimal diverticulosis at the mid sigmoid colon, with surrounding soft tissue inflammation and trace fluid,  reflecting acute diverticulitis. A trace amount of air is seen extending along the mesentery, compatible with microperforation. There is no evidence of abscess formation at this time. Soft tissue inflammation extends proximally nearly to the level of the aortic bifurcation. Vascular/Lymphatic: Minimal calcification is seen at the distal abdominal aorta and its branches. Reproductive: The bladder is mildly distended and grossly unremarkable. The prostate remains normal in size. Other: No additional soft tissue abnormalities are seen. Musculoskeletal: No acute osseous abnormalities are identified. The visualized musculature is unremarkable in appearance. IMPRESSION: Acute diverticulitis at the mid sigmoid colon, with surrounding soft tissue inflammation and trace fluid. Trace air extending along the mesentery, compatible with microperforation. No evidence of abscess formation at this time. Soft tissue inflammation extends proximally nearly to the level of the aortic bifurcation. These results were called by telephone at the time of interpretation on 01/28/2016 at 8:05 pm to Dr. Theotis Burrow, who verbally acknowledged these results. Electronically Signed   By: Garald Balding M.D.   On: 01/28/2016 20:07    Microbiology: Recent Results (from the past 240 hour(s))  Urine culture     Status: Abnormal   Collection Time: 01/28/16  5:00 PM  Result Value Ref Range Status   Specimen Description URINE, CLEAN CATCH  Final   Special Requests NONE  Final   Culture (A)  Final    <10,000 COLONIES/mL INSIGNIFICANT GROWTH Performed at [facility]    Report Status 01/30/2016 FINAL  Final  Culture, blood (routine x 2)     Status: None (Preliminary result)   Collection Time: 01/28/16  5:11 PM  Result Value Ref Range Status   Specimen Description BLOOD RIGHT ANTECUBITAL  Final   Special Requests BOTTLES DRAWN AEROBIC AND ANAEROBIC 5 CC EA  Final   Culture   Final    NO GROWTH 3 DAYS Performed at [facility]    Report Status PENDING  Incomplete  Culture, blood (routine x 2)     Status: None (Preliminary result)   Collection Time: 01/29/16 12:40 AM  Result Value Ref Range Status   Specimen Description BLOOD RIGHT ARM  Final   Special Requests BOTTLES DRAWN AEROBIC AND ANAEROBIC 5CC EACH  Final   Culture   Final    NO GROWTH 2 DAYS Performed at [facility]    Report Status PENDING  Incomplete     Labs: Basic Metabolic Panel:  Recent Labs Lab 01/28/16 1710 01/29/16 0324 01/30/16 0847 01/31/16 0433 02/01/16 0438  NA 134* 136 138 138 138  K 3.4* 3.5 3.6 3.3* 3.6  CL 101 103 103 104 106  CO2 _0 GLUCOSE 103* 130* 112* 114* 122*  BUN _1 CREATININE 1.28* 1.36* 1.24 1.18 1.18  CALCIUM 9.4 8.5* 8.7* 8.7* 9.0   Liver Function Tests:  Recent Labs Lab 01/28/16 1408 01/28/16 1710 01/31/16 0433  AST _2 ALT 16 16* 17  ALKPHOS 69 66 63  BILITOT 0.8 1.3* 1.1  PROT 7.0 7.3 6.6  ALBUMIN 4.1 4.0 3.3*    Recent Labs Lab 01/28/16 1710  LIPASE 16  No results for input(s): AMMONIA in the last 168 hours. CBC:  Recent Labs Lab 01/28/16 1658 01/29/16 0324 01/30/16 0847 01/31/16 0433 02/01/16 0438  WBC 18.2* 17.3* 15.2* 13.1* 10.9*  NEUTROABS 13.4*  --   --   --   --   HGB 15.8 13.6 14.0 13.4 14.2  HCT 46.2 40.9 41.4 40.4 41.9  MCV 86.4 86.8 86.6 86.3 85.5  PLT 251 216 218 249 279    CBG:  Recent Labs Lab 01/31/16 1707 01/31/16 1941 01/31/16 2007 01/31/16 2359 02/01/16 0725  GLUCAP 117* 124* 110* 115* 116*       Signed:  Oswald Hillock MD.  [facility] 02/01/2016, 9:20 AM

## 2016-02-01 NOTE — Progress Notes (Signed)
Discharge instructions discussed with patient and wife, verbalized agreement and understanding.  Work note given to patient.  Instructed patient to pick up antibiotic from pharmacy.

## 2016-02-02 LAB — CULTURE, BLOOD (ROUTINE X 2): Culture: NO GROWTH

## 2016-02-03 LAB — CULTURE, BLOOD (ROUTINE X 2): Culture: NO GROWTH

## 2016-04-30 ENCOUNTER — Ambulatory Visit (INDEPENDENT_AMBULATORY_CARE_PROVIDER_SITE_OTHER): Payer: Self-pay | Admitting: Family Medicine

## 2016-04-30 VITALS — BP 130/80 | HR 83 | Temp 98.4°F | Resp 18 | Ht 72.0 in | Wt 391.0 lb

## 2016-04-30 DIAGNOSIS — Z024 Encounter for examination for driving license: Secondary | ICD-10-CM

## 2016-04-30 DIAGNOSIS — Z0289 Encounter for other administrative examinations: Secondary | ICD-10-CM

## 2016-04-30 LAB — POCT URINALYSIS DIP (MANUAL ENTRY)
Bilirubin, UA: NEGATIVE
Blood, UA: NEGATIVE
Glucose, UA: NEGATIVE
Ketones, POC UA: NEGATIVE
Leukocytes, UA: NEGATIVE
Nitrite, UA: NEGATIVE
Protein Ur, POC: NEGATIVE
Spec Grav, UA: 1.02

## 2016-04-30 NOTE — Patient Instructions (Addendum)
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 Follow-up with me in the next 6 weeks for diabetes and blood pressure discussion.    IF you received an x-ray today, you will receive an invoice from [facility]. Please contact [facility] at [phone] with questions or concerns regarding your invoice.   IF you received labwork today, you will receive an invoice from [facility]. Please contact [facility] at [phone] with questions or concerns regarding your invoice.   Our billing staff will not be able to assist you with questions regarding bills from these companies.  You will be contacted with the lab results as soon as they are available. The fastest way to get your results is to activate your My Chart account. Instructions are located on the last page of this paperwork. If you have not heard from [place] regarding the results in 2 weeks, please contact this office.

## 2016-04-30 NOTE — Progress Notes (Signed)
Subjective:  By signing my name below, I, Raven Small, attest that this documentation has been prepared under the direction and in the presence of [name], MD.  Electronically Signed: Thea Alken, ED Scribe. 04/30/2016. 2:09 PM.   Patient ID: Carlos Phillips, male    DOB: Aug 13, 1965, 51 y.o.   MRN: [id number]  HPI Chief Complaint  Patient presents with  . Employment Physical    HPI Comments: Carlos Phillips is a 51 y.o. male who presents to the Primary [facility] at [facility] for a DOT physical. He has a hx of HTN and HLD.   Diabetes He was last seen for his diabetes in 08/2015. At that visit his A1C was 6.3. Option of diet control as weight improved.  Pt stopped taking medication 01/2016. States medication was causing diarrhea. He also stopped checking his blood sugars, last check 11/9, reading of 120 at that time.   Wt Readings from Last 3 Encounters:  04/30/16 (!) 391 lb (177.4 kg)  01/28/16 (!) 380 lb (172.4 kg)  01/28/16 (!) 387 lb (175.5 kg)    Hypertension Hx of PVD. Takes Plavix for PVD,Norvasc and catapres. Pt denies CP, chest tightness upon exertion.  Diverticulitis He was admitted for diverticulitis in 11/2015.  Sleep Pt denies trouble sleeping. Denies snoring and day time somnolence.   Vision  He denies trouble with vision and problem with glare.  Patient Active Problem List   Diagnosis Date Noted  . Sepsis ([place]) 01/29/2016  . PVD (peripheral vascular disease) ([place]) 01/29/2016  . Diverticulitis 01/28/2016  . Special screening for malignant neoplasms, colon   . Type 2 diabetes mellitus with diabetic dermatitis, without long-term current use of insulin ([place]) 05/03/2015  . Embolism ([place]) 03/07/2013  . Ischemia of extremity 02/25/2013  . HTN (hypertension) 05/07/2011  . Erectile dysfunction 05/07/2011  . Obesity 05/07/2011   Past Medical History:  Diagnosis Date  . Diabetes mellitus without complication ([place])   . DVT (deep venous thrombosis) ([place]) 02/2013   . Erectile dysfunction   . Hyperlipidemia   . Hypertension   . Obesity    BMI >52  . PVD (peripheral vascular disease) ([place])    Past Surgical History:  Procedure Laterality Date  . ABDOMINAL AORTAGRAM N/A 02/28/2013   Procedure: ABDOMINAL Maxcine Ham;  Surgeon: Conrad [place], MD;  Location: [facility] CATH LAB;  Service: Cardiovascular;  Laterality: N/A;  . COLONOSCOPY WITH PROPOFOL N/A 12/25/2015   Procedure: COLONOSCOPY WITH PROPOFOL;  Surgeon: Manus Gunning, MD;  Location: [facility] ENDOSCOPY;  Service: Gastroenterology;  Laterality: N/A;  . EMBOLECTOMY Left 02/25/2013   Procedure: Left Popliteal EMBOLECTOMY Poss fasciotomy;  Surgeon: Elam Dutch, MD;  Location: [facility] OR;  Service: Vascular;  Laterality: Left;  Left poplital and Tibial embolectomy with four compartment Fasciotomy with vein patch angioplasty left popliteal artery.   Allergies  Allergen Reactions  . Morphine And Related Nausea And Vomiting   Prior to Admission medications   Medication Sig Start Date End Date Taking? Authorizing Provider  amLODipine (NORVASC) 10 MG tablet TAKE ONE TABLET BY MOUTH ONCE DAILY 09/24/15  Yes Wendie Agreste, MD  atorvastatin (LIPITOR) 10 MG tablet Take one tablet by mouth once daily. 09/24/15  Yes Wendie Agreste, MD  cloNIDine (CATAPRES) 0.1 MG tablet TAKE ONE TABLET BY MOUTH TWICE DAILY 09/24/15  Yes Wendie Agreste, MD  clopidogrel (PLAVIX) 75 MG tablet Take 1 tablet (75 mg total) by mouth daily with breakfast. 09/24/15  Yes Wendie Agreste, MD  hydrochlorothiazide (  HYDRODIURIL) 25 MG tablet TAKE ONE TABLET BY MOUTH ONCE DAILY. 09/24/15  Yes Wendie Agreste, MD  Multiple Vitamins-Minerals (MULTIVITAMIN WITH MINERALS) tablet Take 1 tablet by mouth daily.   Yes Historical Provider, MD  sildenafil (VIAGRA) 100 MG tablet Take 0.5-1 tablets (50-100 mg total) by mouth daily as needed for erectile dysfunction. 09/24/15 09/22/16 Yes Wendie Agreste, MD  Blood Glucose Monitoring Suppl (GLUCOCOM BLOOD  GLUCOSE MONITOR) DEVI 1 kit by Does not apply route once. Patient not taking: Reported on 04/30/2016 06/21/15   Wendie Agreste, MD  glucose blood test strip To test blood sugar once per day. Dx 250.00.  Brand per insurance coverage. Patient not taking: Reported on 04/30/2016 06/21/15   Wendie Agreste, MD  Lancet Device MISC To test blood sugar once per day. Dx 250.00.  Brand per insurance coverage. Patient not taking: Reported on 04/30/2016 06/21/15   Wendie Agreste, MD  metFORMIN (GLUCOPHAGE) 500 MG tablet Take 1 tablet (500 mg total) by mouth daily with breakfast. Patient not taking: Reported on 04/30/2016 09/24/15   Wendie Agreste, MD   Social History   Social History  . Marital status: Married    Spouse name: Ivin Booty  . Number of children: 2  . Years of education: N/A   Occupational History  . Truck driver    Social History Main Topics  . Smoking status: Former Smoker    Packs/day: 1.00    Years: 30.00    Types: Cigarettes    Quit date: 02/25/2013  . Smokeless tobacco: Never Used  . Alcohol use 0.0 oz/week     Comment: social- rare  . Drug use: No  . Sexual activity: Yes    Partners: Female   Other Topics Concern  . Not on file   Social History Narrative  . No narrative on file    Review of Systems  Constitutional: Negative for fatigue and unexpected weight change.  Eyes: Negative for visual disturbance.  Respiratory: Negative for cough, chest tightness and shortness of breath.   Cardiovascular: Negative for chest pain, palpitations and leg swelling.  Gastrointestinal: Negative for abdominal pain and blood in stool.  Neurological: Negative for dizziness, light-headedness and headaches.   Objective:   Physical Exam  Constitutional: He is oriented to person, place, and time. He appears well-developed and well-nourished.  obese  HENT:  Head: Normocephalic and atraumatic.  Right Ear: External ear normal.  Left Ear: External ear normal.  Mouth/Throat: Oropharynx is  clear and moist.  Eyes: Conjunctivae and EOM are normal. Pupils are equal, round, and reactive to light.  Neck: Normal range of motion. Neck supple. No thyromegaly present.  Cardiovascular: Normal rate, regular rhythm, normal heart sounds and intact distal pulses.   Pulmonary/Chest: Effort normal and breath sounds normal. No respiratory distress. He has no wheezes.  Abdominal: Soft. He exhibits no distension. There is no tenderness. Hernia confirmed negative in the right inguinal area and confirmed negative in the left inguinal area.  Musculoskeletal: Normal range of motion. He exhibits no edema or tenderness.  Lymphadenopathy:    He has no cervical adenopathy.  Neurological: He is alert and oriented to person, place, and time. He has normal reflexes.  Skin: Skin is warm and dry.  Psychiatric: He has a normal mood and affect. His behavior is normal.  Vitals reviewed.   Vitals:   04/30/16 1348  BP: 130/80  Pulse: 83  Resp: 18  Temp: 98.4 F (36.9 C)  SpO2: 98%  Weight: Marland Kitchen)  391 lb (177.4 kg)  Height: 6' (1.829 m)     Visual Acuity Screening   Right eye Left eye Both eyes  Without correction: '20/20 20/20 20/20 '  With correction:     Hearing Screening Comments: Pass whisper 8 ft bilateral  Assessment & Plan:  Carlos Phillips is a 51 y.o. male Physical examination of employee - Plan: POCT urinalysis dipstick, CANCELED: Urinalysis, dipstick only  Encounter for commercial driver medical examination (CDME)  DOT physical, one year card given for hypertension and diabetes, recently diet-controlled. Recommended follow-up with PCP for updated A1c testing, but no glycosuria on in office urinalysis. History of obesity, but denies snoring or daytime somnolence. No other concerning findings on exam or history. See paperwork  No orders of the defined types were placed in this encounter.  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 Follow-up with me in the next 6 weeks for diabetes and  blood pressure discussion.    IF you received an x-ray today, you will receive an invoice from [facility]. Please contact [facility] at [phone] with questions or concerns regarding your invoice.   IF you received labwork today, you will receive an invoice from [facility]. Please contact [facility] at [phone] with questions or concerns regarding your invoice.   Our billing staff will not be able to assist you with questions regarding bills from these companies.  You will be contacted with the lab results as soon as they are available. The fastest way to get your results is to activate your My Chart account. Instructions are located on the last page of this paperwork. If you have not heard from [place] regarding the results in 2 weeks, please contact this office.      I personally performed the services described in this documentation, which was scribed in my presence. The recorded information has been reviewed and considered for accuracy and completeness, addended by me as needed, and agree with information above.  Signed,   [name], MD [facility] at [place].  04/30/16 2:55 PM

## 2016-05-05 ENCOUNTER — Other Ambulatory Visit: Payer: Self-pay | Admitting: *Deleted

## 2016-05-05 DIAGNOSIS — I739 Peripheral vascular disease, unspecified: Secondary | ICD-10-CM

## 2016-05-10 ENCOUNTER — Encounter: Payer: Self-pay | Admitting: Family

## 2016-05-19 ENCOUNTER — Encounter (HOSPITAL_COMMUNITY): Payer: 59

## 2016-05-19 ENCOUNTER — Ambulatory Visit: Payer: 59 | Admitting: Family

## 2016-06-09 ENCOUNTER — Encounter: Payer: 59 | Admitting: Family Medicine

## 2016-06-18 ENCOUNTER — Ambulatory Visit (INDEPENDENT_AMBULATORY_CARE_PROVIDER_SITE_OTHER): Payer: 59 | Admitting: Emergency Medicine

## 2016-06-18 VITALS — BP 133/90 | HR 87 | Temp 98.5°F | Resp 17 | Ht 71.5 in | Wt 389.0 lb

## 2016-06-18 DIAGNOSIS — Z0289 Encounter for other administrative examinations: Secondary | ICD-10-CM | POA: Diagnosis not present

## 2016-06-18 DIAGNOSIS — I10 Essential (primary) hypertension: Secondary | ICD-10-CM | POA: Diagnosis not present

## 2016-06-18 DIAGNOSIS — E119 Type 2 diabetes mellitus without complications: Secondary | ICD-10-CM | POA: Diagnosis not present

## 2016-06-18 NOTE — Progress Notes (Signed)
Carlos Phillips 51 y.o.   Chief Complaint  Patient presents with  . Employment Physical    HISTORY OF PRESENT ILLNESS: This is a 51 y.o. male here for employment physical and paperwork.  HPI   Prior to Admission medications   Medication Sig Start Date End Date Taking? Authorizing Provider  amLODipine (NORVASC) 10 MG tablet TAKE ONE TABLET BY MOUTH ONCE DAILY 09/24/15  Yes Carlos Agreste, MD  atorvastatin (LIPITOR) 10 MG tablet Take one tablet by mouth once daily. 09/24/15  Yes Carlos Agreste, MD  Blood Glucose Monitoring Suppl (GLUCOCOM BLOOD GLUCOSE MONITOR) DEVI 1 kit by Does not apply route once. 06/21/15  Yes Carlos Agreste, MD  cloNIDine (CATAPRES) 0.1 MG tablet TAKE ONE TABLET BY MOUTH TWICE DAILY 09/24/15  Yes Carlos Agreste, MD  clopidogrel (PLAVIX) 75 MG tablet Take 1 tablet (75 mg total) by mouth daily with breakfast. 09/24/15  Yes Carlos Agreste, MD  glucose blood test strip To test blood sugar once per day. Dx 250.00.  Brand per insurance coverage. 06/21/15  Yes Carlos Agreste, MD  hydrochlorothiazide (HYDRODIURIL) 25 MG tablet TAKE ONE TABLET BY MOUTH ONCE DAILY. 09/24/15  Yes Carlos Agreste, MD  Lancet Device MISC To test blood sugar once per day. Dx 250.00.  Brand per insurance coverage. 06/21/15  Yes Carlos Agreste, MD  metFORMIN (GLUCOPHAGE) 500 MG tablet Take 1 tablet (500 mg total) by mouth daily with breakfast. 09/24/15  Yes Carlos Agreste, MD  Multiple Vitamins-Minerals (MULTIVITAMIN WITH MINERALS) tablet Take 1 tablet by mouth daily.   Yes Historical Provider, MD  sildenafil (VIAGRA) 100 MG tablet Take 0.5-1 tablets (50-100 mg total) by mouth daily as needed for erectile dysfunction. 09/24/15 09/22/16 Yes Carlos Agreste, MD    Allergies  Allergen Reactions  . Morphine And Related Nausea And Vomiting    Patient Active Problem List   Diagnosis Date Noted  . Sepsis (Carlos Phillips) 01/29/2016  . PVD (peripheral vascular Phillips) (Carlos Phillips) 01/29/2016  .  Diverticulitis 01/28/2016  . Special screening for malignant neoplasms, colon   . Type 2 Carlos Phillips mellitus with diabetic dermatitis, without long-term current use of insulin (Carlos Phillips) 05/03/2015  . Embolism (Carlos Phillips) 03/07/2013  . Ischemia of extremity 02/25/2013  . HTN (hypertension) 05/07/2011  . Erectile dysfunction 05/07/2011  . Obesity 05/07/2011    Past Medical History:  Diagnosis Date  . Carlos Phillips mellitus without complication ([place])   . DVT (deep venous thrombosis) ([place]) 02/2013  . Erectile dysfunction   . Hyperlipidemia   . Hypertension   . Obesity    BMI >52  . PVD (peripheral vascular Phillips) (Carlos Phillips)     Past Surgical History:  Procedure Laterality Date  . ABDOMINAL AORTAGRAM N/A 02/28/2013   Procedure: ABDOMINAL Carlos Phillips;  Surgeon: Carlos [place], MD;  Location: [facility];  Service: Cardiovascular;  Laterality: N/A;  . COLONOSCOPY WITH PROPOFOL N/A 12/25/2015   Procedure: COLONOSCOPY WITH PROPOFOL;  Surgeon: Carlos Gunning, MD;  Location: [facility];  Service: Gastroenterology;  Laterality: N/A;  . EMBOLECTOMY Left 02/25/2013   Procedure: Left Popliteal EMBOLECTOMY Poss fasciotomy;  Surgeon: Carlos Dutch, MD;  Location: [facility];  Service: Vascular;  Laterality: Left;  Left poplital and Tibial embolectomy with four compartment Fasciotomy with vein patch angioplasty left popliteal artery.    Social History   Social History  . Marital status: Married    Spouse name: Carlos Phillips  . Number of children: 2  . Years of education: N/A  Occupational History  . Truck driver    Social History Main Topics  . Smoking status: Former Smoker    Packs/day: 1.00    Years: 30.00    Types: Cigarettes    Quit date: 02/25/2013  . Smokeless tobacco: Never Used  . Alcohol use 0.0 oz/week     Comment: social- rare  . Drug use: No  . Sexual activity: Yes    Partners: Female   Other Topics Concern  . Not on file   Social History Narrative  . No narrative on file    Family  History  Problem Relation Age of Onset  . Carlos Phillips Carlos Phillips   . Carlos Phillips Carlos Phillips   . Carlos Phillips Carlos Phillips   . Colon cancer Neg Hx      Review of Systems  Constitutional: Negative for chills, fever and weight loss.  HENT: Negative.   Eyes: Negative.   Respiratory: Negative for cough and shortness of breath.   Cardiovascular: Negative for chest pain and leg swelling.  Gastrointestinal: Negative for abdominal pain, diarrhea, nausea and vomiting.  Genitourinary: Negative for dysuria and hematuria.  Musculoskeletal: Negative for back pain, myalgias and neck pain.  Skin: Negative for rash.  Neurological: Negative for dizziness, sensory change, focal weakness and headaches.  Endo/Heme/Allergies: Negative.   All other systems reviewed and are negative.  Vitals:   06/18/16 0959  BP: 133/90  Pulse: 87  Resp: 17  Temp: 98.5 F (36.9 C)     Physical Exam  Constitutional: He is oriented to person, place, and time. He appears well-developed and well-nourished.  HENT:  Head: Normocephalic and atraumatic.  Nose: Nose normal.  Mouth/Throat: Oropharynx is clear and moist. No oropharyngeal exudate.  Eyes: Conjunctivae and EOM are normal. Pupils are equal, round, and reactive to light.  Neck: Normal range of motion. Neck supple. No JVD present. No thyromegaly present.  Cardiovascular: Normal rate, regular rhythm and normal Carlos sounds.   Pulmonary/Chest: Effort normal and breath sounds normal.  Abdominal: Soft. Bowel sounds are normal. He exhibits no distension. There is no tenderness.  Musculoskeletal: Normal range of motion.  Lymphadenopathy:    He has no cervical adenopathy.  Neurological: He is alert and oriented to person, place, and time. No sensory deficit. He exhibits normal muscle tone. Coordination normal.  Skin: Skin is warm and dry. Capillary refill takes less than 2 seconds.  Psychiatric: He has a normal mood and affect. His behavior is normal.  Vitals reviewed.    ASSESSMENT  & PLAN: Carlos Phillips was seen today for employment physical.  Diagnoses and all orders for this visit:  Physical examination of employee -     Comprehensive metabolic panel -     CBC with Differential/Platelet -     Lipid panel  Controlled type 2 Carlos Phillips mellitus without complication, without long-term current use of insulin ([place])  Essential hypertension   Paperwork/forms filled out.   Agustina Caroli, MD [facility]

## 2016-06-18 NOTE — Patient Instructions (Signed)
     IF you received an x-ray today, you will receive an invoice from [facility]. Please contact [facility] at [phone] with questions or concerns regarding your invoice.   IF you received labwork today, you will receive an invoice from [facility]. Please contact [facility] at [phone] with questions or concerns regarding your invoice.   Our billing staff will not be able to assist you with questions regarding bills from these companies.  You will be contacted with the lab results as soon as they are available. The fastest way to get your results is to activate your My Chart account. Instructions are located on the last page of this paperwork. If you have not heard from us regarding the results in 2 weeks, please contact this office.     

## 2016-06-19 LAB — COMPREHENSIVE METABOLIC PANEL
ALT: 25 IU/L (ref 0–44)
AST: 20 IU/L (ref 0–40)
Albumin/Globulin Ratio: 1.5 (ref 1.2–2.2)
Albumin: 4 g/dL (ref 3.5–5.5)
Alkaline Phosphatase: 77 IU/L (ref 39–117)
BUN/Creatinine Ratio: 9 (ref 9–20)
BUN: 10 mg/dL (ref 6–24)
Bilirubin Total: 0.3 mg/dL (ref 0.0–1.2)
CO2: 22 mmol/L (ref 18–29)
Calcium: 9.7 mg/dL (ref 8.7–10.2)
Chloride: 101 mmol/L (ref 96–106)
Creatinine, Ser: 1.08 mg/dL (ref 0.76–1.27)
GFR calc Af Amer: 91 mL/min/{1.73_m2} (ref >59)
GFR calc non Af Amer: 79 mL/min/{1.73_m2} (ref >59)
Globulin, Total: 2.7 g/dL (ref 1.5–4.5)
Glucose: 105 mg/dL — ABNORMAL HIGH (ref 65–99)
Potassium: 3.8 mmol/L (ref 3.5–5.2)
Sodium: 139 mmol/L (ref 134–144)
Total Protein: 6.7 g/dL (ref 6.0–8.5)

## 2016-06-19 LAB — CBC WITH DIFFERENTIAL/PLATELET
Basophils Absolute: 0 10*3/uL (ref 0.0–0.2)
Basos: 0 %
EOS (ABSOLUTE): 1.4 10*3/uL — ABNORMAL HIGH (ref 0.0–0.4)
Eos: 16 %
Hematocrit: 44.3 % (ref 37.5–51.0)
Hemoglobin: 15.2 g/dL (ref 13.0–17.7)
Immature Grans (Abs): 0 10*3/uL (ref 0.0–0.1)
Immature Granulocytes: 0 %
Lymphocytes Absolute: 2.7 10*3/uL (ref 0.7–3.1)
Lymphs: 30 %
MCH: 29.3 pg (ref 26.6–33.0)
MCHC: 34.3 g/dL (ref 31.5–35.7)
MCV: 86 fL (ref 79–97)
Monocytes Absolute: 0.6 10*3/uL (ref 0.1–0.9)
Monocytes: 6 %
Neutrophils Absolute: 4.3 10*3/uL (ref 1.4–7.0)
Neutrophils: 48 %
Platelets: 298 10*3/uL (ref 150–379)
RBC: 5.18 x10E6/uL (ref 4.14–5.80)
RDW: 14.5 % (ref 12.3–15.4)
WBC: 9 10*3/uL (ref 3.4–10.8)

## 2016-06-19 LAB — LIPID PANEL
Chol/HDL Ratio: 4.2 ratio units (ref 0.0–5.0)
Cholesterol, Total: 203 mg/dL — ABNORMAL HIGH (ref 100–199)
HDL: 48 mg/dL (ref >39)
LDL Calculated: 135 mg/dL — ABNORMAL HIGH (ref 0–99)
Triglycerides: 102 mg/dL (ref 0–149)
VLDL Cholesterol Cal: 20 mg/dL (ref 5–40)

## 2016-06-24 ENCOUNTER — Telehealth: Payer: Self-pay | Admitting: Emergency Medicine

## 2016-06-24 NOTE — Telephone Encounter (Signed)
Pt left a message in medical records to check on his CPE form for work. He had a physical this past Saturday with [facility] and he is wondering if we had faxed that form over to his employer yet.  Please advise Pt phone: [phone] Employer fax: [phone]

## 2016-06-25 NOTE — Telephone Encounter (Signed)
Do you recall this form?

## 2016-10-31 ENCOUNTER — Other Ambulatory Visit: Payer: Self-pay | Admitting: Family Medicine

## 2016-10-31 DIAGNOSIS — I739 Peripheral vascular disease, unspecified: Secondary | ICD-10-CM

## 2016-10-31 DIAGNOSIS — I10 Essential (primary) hypertension: Secondary | ICD-10-CM

## 2016-11-04 ENCOUNTER — Telehealth: Payer: Self-pay | Admitting: Family Medicine

## 2016-11-04 ENCOUNTER — Other Ambulatory Visit: Payer: Self-pay | Admitting: Emergency Medicine

## 2016-11-04 DIAGNOSIS — I10 Essential (primary) hypertension: Secondary | ICD-10-CM

## 2016-11-04 DIAGNOSIS — I739 Peripheral vascular disease, unspecified: Secondary | ICD-10-CM

## 2016-11-04 MED ORDER — AMLODIPINE BESYLATE 10 MG PO TABS: ORAL_TABLET | ORAL | 0 refills | Status: DC

## 2016-11-04 MED ORDER — CLOPIDOGREL BISULFATE 75 MG PO TABS: 75.0000 mg | ORAL_TABLET | Freq: Every day | ORAL | 0 refills | Status: DC

## 2016-11-04 MED ORDER — HYDROCHLOROTHIAZIDE 25 MG PO TABS: ORAL_TABLET | ORAL | 0 refills | Status: DC

## 2016-11-04 NOTE — Telephone Encounter (Signed)
30 day courtesy supply given until seen by Dr. Carlota Raspberry 11/14/16

## 2016-11-04 NOTE — Telephone Encounter (Signed)
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 He does have an appt on that day with Dr Carlota Raspberry for med refills but he is completely out of his medications now and needs enough to last him until the 20th when he can get back in town.   He needs his Clopidogrel Bisulfate 75 MG, HydroCHLOROthiazide 25 MG , and AmLODIPine Besylate 10 MG   [facility] [address].

## 2016-11-14 ENCOUNTER — Ambulatory Visit: Payer: 59 | Admitting: Family Medicine

## 2016-12-13 ENCOUNTER — Other Ambulatory Visit: Payer: Self-pay | Admitting: Family Medicine

## 2016-12-13 DIAGNOSIS — I739 Peripheral vascular disease, unspecified: Secondary | ICD-10-CM

## 2016-12-13 DIAGNOSIS — I10 Essential (primary) hypertension: Secondary | ICD-10-CM

## 2016-12-17 ENCOUNTER — Ambulatory Visit: Payer: 59 | Admitting: Family Medicine

## 2016-12-17 NOTE — Progress Notes (Deleted)
  No chief complaint on file.   HPI  Past Medical History:  Diagnosis Date  . Diabetes mellitus without complication ([place])   . DVT (deep venous thrombosis) ([place]) 02/2013  . Erectile dysfunction   . Hyperlipidemia   . Hypertension   . Obesity    BMI >52  . PVD (peripheral vascular disease) ([place])     Current Outpatient Prescriptions  Medication Sig Dispense Refill  . amLODipine (NORVASC) 10 MG tablet TAKE ONE TABLET BY MOUTH ONCE DAILY 30 tablet 0  . atorvastatin (LIPITOR) 10 MG tablet Take one tablet by mouth once daily. 30 tablet 5  . Blood Glucose Monitoring Suppl (GLUCOCOM BLOOD GLUCOSE MONITOR) DEVI 1 kit by Does not apply route once. 1 each 0  . cloNIDine (CATAPRES) 0.1 MG tablet TAKE ONE TABLET BY MOUTH TWICE DAILY 60 tablet 5  . clopidogrel (PLAVIX) 75 MG tablet Take 1 tablet (75 mg total) by mouth daily with breakfast. 30 tablet 0  . glucose blood test strip To test blood sugar once per day. Dx 250.00.  Brand per insurance coverage. 100 each 12  . hydrochlorothiazide (HYDRODIURIL) 25 MG tablet TAKE ONE TABLET BY MOUTH ONCE DAILY. 30 tablet 0  . Lancet Device MISC To test blood sugar once per day. Dx 250.00.  Brand per insurance coverage. 100 each 6  . metFORMIN (GLUCOPHAGE) 500 MG tablet Take 1 tablet (500 mg total) by mouth daily with breakfast. 30 tablet 5  . Multiple Vitamins-Minerals (MULTIVITAMIN WITH MINERALS) tablet Take 1 tablet by mouth daily.    . sildenafil (VIAGRA) 100 MG tablet Take 0.5-1 tablets (50-100 mg total) by mouth daily as needed for erectile dysfunction. 5 tablet 5   No current facility-administered medications for this visit.     Allergies:  Allergies  Allergen Reactions  . Morphine And Related Nausea And Vomiting    Past Surgical History:  Procedure Laterality Date  . ABDOMINAL AORTAGRAM N/A 02/28/2013   Procedure: ABDOMINAL Maxcine Ham;  Surgeon: Conrad [place], MD;  [facility];  Service: Cardiovascular;  Laterality: N/A;  .  COLONOSCOPY WITH PROPOFOL N/A 12/25/2015   Procedure: COLONOSCOPY WITH PROPOFOL;  Surgeon: Manus Gunning, MD;  Location: [facility] ENDOSCOPY;  Service: Gastroenterology;  Laterality: N/A;  . EMBOLECTOMY Left 02/25/2013   Procedure: Left Popliteal EMBOLECTOMY Poss fasciotomy;  Surgeon: Elam Dutch, MD;  Location: [facility] OR;  Service: Vascular;  Laterality: Left;  Left poplital and Tibial embolectomy with four compartment Fasciotomy with vein patch angioplasty left popliteal artery.    Social History   Social History  . Marital status: Married    Spouse name: Ivin Booty  . Number of children: 2  . Years of education: N/A   Occupational History  . Truck driver    Social History Main Topics  . Smoking status: Former Smoker    Packs/day: 1.00    Years: 30.00    Types: Cigarettes    Quit date: 02/25/2013  . Smokeless tobacco: Never Used  . Alcohol use 0.0 oz/week     Comment: social- rare  . Drug use: No  . Sexual activity: Yes    Partners: Female   Other Topics Concern  . Not on file   Social History Narrative  . No narrative on file    ROS  Objective: There were no vitals filed for this visit.  Physical Exam  Assessment and Plan There are no diagnoses linked to this encounter.   Delores P [facility]

## 2016-12-23 ENCOUNTER — Ambulatory Visit: Payer: 59 | Admitting: Family Medicine

## 2016-12-31 ENCOUNTER — Ambulatory Visit (INDEPENDENT_AMBULATORY_CARE_PROVIDER_SITE_OTHER): Payer: 59 | Admitting: Physician Assistant

## 2016-12-31 ENCOUNTER — Encounter: Payer: Self-pay | Admitting: Physician Assistant

## 2016-12-31 VITALS — BP 124/88 | HR 94 | Temp 98.1°F | Resp 18 | Ht 71.89 in | Wt 390.4 lb

## 2016-12-31 DIAGNOSIS — Z86718 Personal history of other venous thrombosis and embolism: Secondary | ICD-10-CM | POA: Diagnosis not present

## 2016-12-31 DIAGNOSIS — E782 Mixed hyperlipidemia: Secondary | ICD-10-CM | POA: Diagnosis not present

## 2016-12-31 DIAGNOSIS — N529 Male erectile dysfunction, unspecified: Secondary | ICD-10-CM

## 2016-12-31 DIAGNOSIS — I10 Essential (primary) hypertension: Secondary | ICD-10-CM | POA: Diagnosis not present

## 2016-12-31 DIAGNOSIS — B353 Tinea pedis: Secondary | ICD-10-CM

## 2016-12-31 DIAGNOSIS — E785 Hyperlipidemia, unspecified: Secondary | ICD-10-CM

## 2016-12-31 DIAGNOSIS — I739 Peripheral vascular disease, unspecified: Secondary | ICD-10-CM

## 2016-12-31 DIAGNOSIS — E11620 Type 2 diabetes mellitus with diabetic dermatitis: Secondary | ICD-10-CM | POA: Diagnosis not present

## 2016-12-31 DIAGNOSIS — E119 Type 2 diabetes mellitus without complications: Secondary | ICD-10-CM | POA: Diagnosis not present

## 2016-12-31 DIAGNOSIS — Z6841 Body Mass Index (BMI) 40.0 and over, adult: Secondary | ICD-10-CM

## 2016-12-31 MED ORDER — CLOPIDOGREL BISULFATE 75 MG PO TABS: 75.0000 mg | ORAL_TABLET | Freq: Every day | ORAL | 0 refills | Status: DC

## 2016-12-31 MED ORDER — SILDENAFIL CITRATE 100 MG PO TABS: 50.0000 mg | ORAL_TABLET | Freq: Every day | ORAL | 5 refills | Status: DC | PRN

## 2016-12-31 MED ORDER — AMLODIPINE BESYLATE 10 MG PO TABS: ORAL_TABLET | ORAL | 0 refills | Status: DC

## 2016-12-31 MED ORDER — KETOCONAZOLE 2 % EX CREA: 1.0000 "application " | TOPICAL_CREAM | Freq: Every day | CUTANEOUS | 3 refills | Status: DC

## 2016-12-31 MED ORDER — CLONIDINE HCL 0.1 MG PO TABS: ORAL_TABLET | ORAL | 5 refills | Status: DC

## 2016-12-31 MED ORDER — HYDROCHLOROTHIAZIDE 25 MG PO TABS: ORAL_TABLET | ORAL | 0 refills | Status: DC

## 2016-12-31 NOTE — Patient Instructions (Addendum)
     IF you received an x-ray today, you will receive an invoice from [facility]. Please contact [facility] at [phone] with questions or concerns regarding your invoice.   IF you received labwork today, you will receive an invoice from [place]. Please contact [facility] at [phone] with questions or concerns regarding your invoice.   Our billing staff will not be able to assist you with questions regarding bills from these companies.  You will be contacted with the lab results as soon as they are available. The fastest way to get your results is to activate your My Chart account. Instructions are located on the last page of this paperwork. If you have not heard from [place] regarding the results in 2 weeks, please contact this office.    Please download the APP called mychart - then use the text to activate this APP - this will allow you to look at your labs and contact me as well as make appointments to see me in the future.  I will contact you with your lab results as soon as they are available.   If you have not heard from me in 2 weeks, please contact me.  The fastest way to get your results is to register for My Chart (see the instructions on this printout).

## 2016-12-31 NOTE — Progress Notes (Addendum)
Carlos Phillips  MRN: [id number] DOB: June 15, 1965  PCP: Wendie Agreste, MD  Chief Complaint  Patient presents with  . Medication Refill    Norvasc, lipitor, clonidine, hydrochlorthiazide    Subjective:  Pt presents to clinic for medication refill.  He does not check his BP at home.  Home glucose low 100s.  He takes his medications daily without problems.  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  He drinks water and a soda once in a while.   He tries to walk - some days - he will try to walk around his truck a few times 1-2 times a week.    History is obtained by patient.  Review of Systems  Constitutional: Negative for chills and fever.  Eyes: Negative for visual disturbance.  Respiratory: Negative for cough and shortness of breath.   Cardiovascular: Negative for chest pain, palpitations and leg swelling.  Skin: Positive for rash (scaling feet).  Neurological: Negative for dizziness, light-headedness, numbness and headaches.       No nerve pain in feet    Patient Active Problem List   Diagnosis Date Noted  . Hyperlipidemia 12/31/2016  . PVD (peripheral vascular disease) ([place]) 01/29/2016  . Diverticulitis 01/28/2016  . Special screening for malignant neoplasms, colon   . Type 2 diabetes mellitus with diabetic dermatitis, without long-term current use of insulin ([place]) 05/03/2015  . History of deep vein thrombosis (DVT) of lower extremity - no problems recently 03/07/2013  . Ischemia of extremity 02/25/2013  . HTN (hypertension) 05/07/2011  . Erectile dysfunction - medication helps 05/07/2011  . Obesity 05/07/2011    Current Outpatient Prescriptions on File Prior to Visit  Medication Sig Dispense Refill  . atorvastatin (LIPITOR) 10 MG tablet Take one tablet by mouth once daily. 30 tablet 5  . Blood Glucose Monitoring Suppl (GLUCOCOM BLOOD GLUCOSE MONITOR) DEVI 1 kit by Does not apply route once. 1 each 0  . glucose blood  test strip To test blood sugar once per day. Dx 250.00.  Brand per insurance coverage. 100 each 12  . Lancet Device MISC To test blood sugar once per day. Dx 250.00.  Brand per insurance coverage. 100 each 6  . metFORMIN (GLUCOPHAGE) 500 MG tablet Take 1 tablet (500 mg total) by mouth daily with breakfast. 30 tablet 5  . Multiple Vitamins-Minerals (MULTIVITAMIN WITH MINERALS) tablet Take 1 tablet by mouth daily.     No current facility-administered medications on file prior to visit.     Allergies  Allergen Reactions  . Morphine And Related Nausea And Vomiting    Past Medical History:  Diagnosis Date  . Diabetes mellitus without complication ([place])   . DVT (deep venous thrombosis) ([place]) 02/2013  . Erectile dysfunction   . Hyperlipidemia   . Hypertension   . Obesity    BMI >52  . PVD (peripheral vascular disease) [facility])    Social History   Social History Narrative  . No narrative on file   Social History  Substance Use Topics  . Smoking status: Former Smoker    Packs/day: 1.00    Years: 30.00    Types: Cigarettes    Quit date: 02/25/2013  . Smokeless tobacco: Never Used  . Alcohol use 0.0 oz/week     Comment: social- rare   family history includes Asthma in his son; Diabetes in his mother; Heart disease in his father.     Objective:  BP 124/88 (BP Location: Left Arm, Patient Position: Sitting, Cuff Size: Large)   Pulse 94   Temp 98.1 F (36.7 C) (Oral)   Resp 18   Ht 5' 11.89" (1.826 m)   Wt (!) 390 lb 6.4 oz (177.1 kg)   SpO2 97%   BMI 53.11 kg/m  Body mass index is 53.11 kg/m.  Physical Exam  Constitutional: He is oriented to person, place, and time and well-developed, well-nourished, and in no distress.  HENT:  Head: Normocephalic and atraumatic.  Right Ear: External ear normal.  Left Ear: External ear normal.  Eyes: Conjunctivae are normal.  Neck: Normal range of motion.  Cardiovascular: Normal rate, regular rhythm, normal heart sounds and intact  distal pulses.   Pulmonary/Chest: Effort normal and breath sounds normal. He has no wheezes.  Musculoskeletal:       Right lower leg: He exhibits no edema.       Left lower leg: He exhibits no edema.  Neurological: He is alert and oriented to person, place, and time. Gait normal.  Skin: Skin is warm and dry. Rash (dry scaling skin with erythematous base) noted.  Psychiatric: Mood, memory, affect and judgment normal.    Wt Readings from Last 3 Encounters:  12/31/16 (!) 390 lb 6.4 oz (177.1 kg)  06/18/16 (!) 389 lb (176.4 kg)  04/30/16 (!) 391 lb (177.4 kg)    Assessment and Plan :  Essential hypertension - Plan: CMP14+EGFR, hydrochlorothiazide (HYDRODIURIL) 25 MG tablet, amLODipine (NORVASC) 10 MG tablet, cloNIDine (CATAPRES) 0.1 MG tablet - controlled at today's visit  Class 3 severe obesity due to excess calories with serious comorbidity and body mass index (BMI) of 50.0 to 59.9 in adult ([place]) - weight is stable - d/w pt that   Hyperlipidemia, unspecified hyperlipidemia type - Plan: Lipid panel - check labs adjust medication if needed - uncontrolled cholesterol esp with DM diagnosis - increase lipitor 55m  Type 2 diabetes mellitus with diabetic dermatitis, without long-term current use of insulin ([place] - Plan: HM Diabetes Foot Exam, Hemoglobin A1c, CMP14+EGFR - get labs and adjust medications as needed - d/w pt at length ways to help with lifestyle changes - labs returbned - increase dose to metformin 5086mbid.  History of deep vein thrombosis (DVT) of lower extremity  Tinea pedis of both feet - Plan: ketoconazole (NIZORAL) 2 % cream - try to keep feet dry  PVD [place]peripheral vascular disease) ([place]- Plan: clopidogrel (PLAVIX) 75 MG tablet - continue current medications  Erectile dysfunction, unspecified erectile dysfunction type - Plan: sildenafil (VIAGRA) 100 MG tablet - this medication helps and refill was given  SaWindell HummingbirdA-C  [facility]0/08/2016 3:28 PM

## 2017-01-01 LAB — HEMOGLOBIN A1C
Est. average glucose Bld gHb Est-mCnc: 140 mg/dL
Hgb A1c MFr Bld: 6.5 % — ABNORMAL HIGH (ref 4.8–5.6)

## 2017-01-01 LAB — CMP14+EGFR
ALT: 21 IU/L (ref 0–44)
AST: 17 IU/L (ref 0–40)
Albumin/Globulin Ratio: 1.7 (ref 1.2–2.2)
Albumin: 4.3 g/dL (ref 3.5–5.5)
Alkaline Phosphatase: 89 IU/L (ref 39–117)
BUN/Creatinine Ratio: 14 (ref 9–20)
BUN: 19 mg/dL (ref 6–24)
Bilirubin Total: 0.4 mg/dL (ref 0.0–1.2)
CO2: 24 mmol/L (ref 20–29)
Calcium: 9.9 mg/dL (ref 8.7–10.2)
Chloride: 100 mmol/L (ref 96–106)
Creatinine, Ser: 1.38 mg/dL — ABNORMAL HIGH (ref 0.76–1.27)
GFR calc Af Amer: 67 mL/min/{1.73_m2} (ref >59)
GFR calc non Af Amer: 58 mL/min/{1.73_m2} — ABNORMAL LOW (ref >59)
Globulin, Total: 2.6 g/dL (ref 1.5–4.5)
Glucose: 100 mg/dL — ABNORMAL HIGH (ref 65–99)
Potassium: 4.2 mmol/L (ref 3.5–5.2)
Sodium: 139 mmol/L (ref 134–144)
Total Protein: 6.9 g/dL (ref 6.0–8.5)

## 2017-01-01 LAB — LIPID PANEL
Chol/HDL Ratio: 4.7 ratio (ref 0.0–5.0)
Cholesterol, Total: 211 mg/dL — ABNORMAL HIGH (ref 100–199)
HDL: 45 mg/dL (ref >39)
LDL Calculated: 137 mg/dL — ABNORMAL HIGH (ref 0–99)
Triglycerides: 147 mg/dL (ref 0–149)
VLDL Cholesterol Cal: 29 mg/dL (ref 5–40)

## 2017-01-03 MED ORDER — ATORVASTATIN CALCIUM 20 MG PO TABS: 20.0000 mg | ORAL_TABLET | ORAL | 0 refills | Status: DC

## 2017-01-03 MED ORDER — METFORMIN HCL 500 MG PO TABS: 500.0000 mg | ORAL_TABLET | Freq: Two times a day (BID) | ORAL | 0 refills | Status: DC

## 2017-01-03 NOTE — Addendum Note (Signed)
Addended by: Mancel Bale on: 01/03/2017 04:51 PM   Modules accepted: Orders

## 2017-01-05 ENCOUNTER — Encounter: Payer: Self-pay | Admitting: Radiology

## 2017-02-17 ENCOUNTER — Other Ambulatory Visit: Payer: Self-pay | Admitting: Physician Assistant

## 2017-02-17 DIAGNOSIS — I10 Essential (primary) hypertension: Secondary | ICD-10-CM

## 2017-02-17 DIAGNOSIS — I739 Peripheral vascular disease, unspecified: Secondary | ICD-10-CM

## 2017-04-26 ENCOUNTER — Encounter: Payer: Self-pay | Admitting: Physician Assistant

## 2017-04-26 ENCOUNTER — Ambulatory Visit (INDEPENDENT_AMBULATORY_CARE_PROVIDER_SITE_OTHER): Payer: Self-pay | Admitting: Physician Assistant

## 2017-04-26 VITALS — BP 120/82 | HR 88 | Temp 97.6°F | Resp 18 | Ht 72.05 in | Wt 393.8 lb

## 2017-04-26 DIAGNOSIS — Z0289 Encounter for other administrative examinations: Secondary | ICD-10-CM

## 2017-04-26 NOTE — Patient Instructions (Signed)
     IF you received an x-ray today, you will receive an invoice from [facility]. Please contact [facility] at [phone] with questions or concerns regarding your invoice.   IF you received labwork today, you will receive an invoice from [facility]. Please contact [facility] at [phone] with questions or concerns regarding your invoice.   Our billing staff will not be able to assist you with questions regarding bills from these companies.  You will be contacted with the lab results as soon as they are available. The fastest way to get your results is to activate your My Chart account. Instructions are located on the last page of this paperwork. If you have not heard from us regarding the results in 2 weeks, please contact this office.     

## 2017-04-26 NOTE — Progress Notes (Signed)
Commercial Driver Medical Examination   Carlos Phillips is a 52 y.o. male with a pertinent medical history of well-controlled diabetes, well-controlled hypertension, dyslipidemia on medication, who presents today for a commercial driver fitness determination physical exam.  He does not take insulin.  The patient reports no problems today. In the past the patient reports receiving 1 year certificates. He denies focal neurological deficits, vision and hearing changes. He denies the habitual use of benzodiazepines, opioids, amphetamines and denies illicit drug use.   Current medications, family history, allergies, social history reviewed by me and exist elsewhere in the encounter.   Review of Systems  Constitutional: Negative for chills, diaphoresis and fever.  Eyes: Negative.   Respiratory: Negative for cough, hemoptysis, sputum production, shortness of breath and wheezing.   Cardiovascular: Negative for chest pain, orthopnea and leg swelling.  Gastrointestinal: Negative for abdominal pain, blood in stool, constipation, diarrhea, heartburn, melena, nausea and vomiting.  Genitourinary: Negative for dysuria, flank pain, frequency, hematuria and urgency.  Skin: Negative for rash.  Neurological: Negative for dizziness, sensory change, speech change, focal weakness and headaches.    Objective:     Vision/hearing:  Visual Acuity Screening   Right eye Left eye Both eyes  Without correction: 20/15 20/13 20/15   With correction:     Comments: Color Vision Screen: Passed Peripheral Vision Screen  R 85  L 85  Hearing Screening Comments: Whisper Test  R 10 ft  L 10 ft  Applicant can recognize and distinguish among traffic control signals and devices showing standard red, green, and amber colors.  Corrective lenses required: No  Monocular Vision?: No  Hearing aid requirement: No  Physical Exam  Constitutional: He is oriented to person, place, and time. He appears well-developed. He  is active and cooperative.  Non-toxic appearance.  Morbidly obese  HENT:  Right Ear: Hearing, tympanic membrane, external ear and ear canal normal.  Left Ear: Hearing, tympanic membrane, external ear and ear canal normal.  Nose: Nose normal. Right sinus exhibits no maxillary sinus tenderness and no frontal sinus tenderness. Left sinus exhibits no maxillary sinus tenderness and no frontal sinus tenderness.  Mouth/Throat: Uvula is midline, oropharynx is clear and moist and mucous membranes are normal. No oropharyngeal exudate, posterior oropharyngeal edema or tonsillar abscesses.  Eyes: Conjunctivae and EOM are normal. Pupils are equal, round, and reactive to light.  Cardiovascular: Normal rate, regular rhythm, S1 normal, S2 normal, normal heart sounds, intact distal pulses and normal pulses. Exam reveals no gallop and no friction rub.  No murmur heard. Pulmonary/Chest: Effort normal. No stridor. No tachypnea. No respiratory distress. He has no wheezes. He has no rales.  Abdominal: Soft. Normal appearance and bowel sounds are normal. He exhibits no distension and no mass. There is no tenderness. There is no rigidity, no rebound, no guarding and no CVA tenderness. No hernia.  Musculoskeletal: He exhibits no edema.  Lymphadenopathy:       Head (right side): No submandibular and no tonsillar adenopathy present.       Head (left side): No submandibular and no tonsillar adenopathy present.    He has no cervical adenopathy.  Neurological: He is alert and oriented to person, place, and time. He has normal strength and normal reflexes. He is not disoriented. No cranial nerve deficit or sensory deficit. He exhibits normal muscle tone. Coordination and gait normal.  Skin: Skin is warm and dry. He is not diaphoretic. No pallor.  Psychiatric: His behavior is normal.  Vitals reviewed.  BP 120/82 (BP Location: Right Arm, Patient Position: Sitting, Cuff Size: Large)   Pulse 88   Temp 97.6 F (36.4 C)  (Oral)   Resp 18   Ht 6' 0.05" (1.83 m)   Wt (!) 393 lb 12.8 oz (178.6 kg)   SpO2 98%   BMI 53.34 kg/m   Labs:     Lab Results  Component Value Date   HGBA1C 6.5 (H) 12/31/2016   Lab Results  Component Value Date   CREATININE 1.38 (H) 12/31/2016   BUN 19 12/31/2016   NA 139 12/31/2016   K 4.2 12/31/2016   CL 100 12/31/2016   CO2 24 12/31/2016   Lab Results  Component Value Date   ALT 21 12/31/2016   AST 17 12/31/2016   ALKPHOS 89 12/31/2016   BILITOT 0.4 12/31/2016   Lab Results  Component Value Date   CHOL 211 (H) 12/31/2016   HDL 45 12/31/2016   LDLCALC 137 (H) 12/31/2016   TRIG 147 12/31/2016   CHOLHDL 4.7 12/31/2016      Assessment:      Meets standards, but periodic monitoring required due to Multiple risk factors for heart disease, hypertension controlled on medication, diabetes controlled on medication.  Driver qualified only for 1 year.    Plan:    Medical examiners certificate completed and printed. Return as needed.    Philis Fendt, MS, PA-C 4:52 PM, 04/26/2017

## 2017-06-17 ENCOUNTER — Ambulatory Visit (INDEPENDENT_AMBULATORY_CARE_PROVIDER_SITE_OTHER): Payer: 59 | Admitting: Emergency Medicine

## 2017-06-17 ENCOUNTER — Encounter: Payer: Self-pay | Admitting: Emergency Medicine

## 2017-06-17 ENCOUNTER — Other Ambulatory Visit: Payer: Self-pay

## 2017-06-17 VITALS — BP 125/78 | HR 68 | Temp 98.4°F | Resp 16 | Ht 72.0 in | Wt 396.8 lb

## 2017-06-17 DIAGNOSIS — Z0000 Encounter for general adult medical examination without abnormal findings: Secondary | ICD-10-CM | POA: Diagnosis not present

## 2017-06-17 DIAGNOSIS — Z6841 Body Mass Index (BMI) 40.0 and over, adult: Secondary | ICD-10-CM

## 2017-06-17 DIAGNOSIS — E11620 Type 2 diabetes mellitus with diabetic dermatitis: Secondary | ICD-10-CM

## 2017-06-17 DIAGNOSIS — I10 Essential (primary) hypertension: Secondary | ICD-10-CM | POA: Diagnosis not present

## 2017-06-17 NOTE — Patient Instructions (Addendum)
   IF you received an x-ray today, you will receive an invoice from [facility]. Please contact  Radiology at [phone] with questions or concerns regarding your invoice.   IF you received labwork today, you will receive an invoice from [facility]. Please contact [facility] at [phone] with questions or concerns regarding your invoice.   Our billing staff will not be able to assist you with questions regarding bills from these companies.  You will be contacted with the lab results as soon as they are available. The fastest way to get your results is to activate your My Chart account. Instructions are located on the last page of this paperwork. If you have not heard from us regarding the results in 2 weeks, please contact this office.      Health Maintenance, Male A healthy lifestyle and preventive care is important for your health and wellness. Ask your health care provider about what schedule of regular examinations is right for you. What should I know about weight and diet? Eat a Healthy Diet  Eat plenty of vegetables, fruits, whole grains, low-fat dairy products, and lean protein.  Do not eat a lot of foods high in solid fats, added sugars, or salt.  Maintain a Healthy Weight Regular exercise can help you achieve or maintain a healthy weight. You should:  Do at least 150 minutes of exercise each week. The exercise should increase your heart rate and make you sweat (moderate-intensity exercise).  Do strength-training exercises at least twice a week.  Watch Your Levels of Cholesterol and Blood Lipids  Have your blood tested for lipids and cholesterol every 5 years starting at 53 years of age. If you are at high risk for heart disease, you should start having your blood tested when you are 53 years old. You may need to have your cholesterol levels checked more often if: ? Your lipid or cholesterol levels are high. ? You are older than 53 years of age. ? You  are at high risk for heart disease.  What should I know about cancer screening? Many types of cancers can be detected early and may often be prevented. Lung Cancer  You should be screened every year for lung cancer if: ? You are a current smoker who has smoked for at least 30 years. ? You are a former smoker who has quit within the past 15 years.  Talk to your health care provider about your screening options, when you should start screening, and how often you should be screened.  Colorectal Cancer  Routine colorectal cancer screening usually begins at 53 years of age and should be repeated every 5-10 years until you are 53 years old. You may need to be screened more often if early forms of precancerous polyps or small growths are found. Your health care provider may recommend screening at an earlier age if you have risk factors for colon cancer.  Your health care provider may recommend using home test kits to check for hidden blood in the stool.  A small camera at the end of a tube can be used to examine your colon (sigmoidoscopy or colonoscopy). This checks for the earliest forms of colorectal cancer.  Prostate and Testicular Cancer  Depending on your age and overall health, your health care provider may do certain tests to screen for prostate and testicular cancer.  Talk to your health care provider about any symptoms or concerns you have about testicular or prostate cancer.  Skin Cancer  Check your skin   from head to toe regularly.  Tell your health care provider about any new moles or changes in moles, especially if: ? There is a change in a mole's size, shape, or color. ? You have a mole that is larger than a pencil eraser.  Always use sunscreen. Apply sunscreen liberally and repeat throughout the day.  Protect yourself by wearing long sleeves, pants, a wide-brimmed hat, and sunglasses when outside.  What should I know about heart disease, diabetes, and high blood  pressure?  If you are 18-39 years of age, have your blood pressure checked every 3-5 years. If you are 40 years of age or older, have your blood pressure checked every year. You should have your blood pressure measured twice-once when you are at a hospital or clinic, and once when you are not at a hospital or clinic. Record the average of the two measurements. To check your blood pressure when you are not at a hospital or clinic, you can use: ? An automated blood pressure machine at a pharmacy. ? A home blood pressure monitor.  Talk to your health care provider about your target blood pressure.  If you are between 45-79 years old, ask your health care provider if you should take aspirin to prevent heart disease.  Have regular diabetes screenings by checking your fasting blood sugar level. ? If you are at a normal weight and have a low risk for diabetes, have this test once every three years after the age of 45. ? If you are overweight and have a high risk for diabetes, consider being tested at a younger age or more often.  A one-time screening for abdominal aortic aneurysm (AAA) by ultrasound is recommended for men aged 65-75 years who are current or former smokers. What should I know about preventing infection? Hepatitis B If you have a higher risk for hepatitis B, you should be screened for this virus. Talk with your health care provider to find out if you are at risk for hepatitis B infection. Hepatitis C Blood testing is recommended for:  Everyone born from 1945 through 1965.  Anyone with known risk factors for hepatitis C.  Sexually Transmitted Diseases (STDs)  You should be screened each year for STDs including gonorrhea and chlamydia if: ? You are sexually active and are younger than 53 years of age. ? You are older than 53 years of age and your health care provider tells you that you are at risk for this type of infection. ? Your sexual activity has changed since you were last  screened and you are at an increased risk for chlamydia or gonorrhea. Ask your health care provider if you are at risk.  Talk with your health care provider about whether you are at high risk of being infected with HIV. Your health care provider may recommend a prescription medicine to help prevent HIV infection.  What else can I do?  Schedule regular health, dental, and eye exams.  Stay current with your vaccines (immunizations).  Do not use any tobacco products, such as cigarettes, chewing tobacco, and e-cigarettes. If you need help quitting, ask your health care provider.  Limit alcohol intake to no more than 2 drinks per day. One drink equals 12 ounces of beer, 5 ounces of wine, or 1 ounces of hard liquor.  Do not use street drugs.  Do not share needles.  Ask your health care provider for help if you need support or information about quitting drugs.  Tell your health care   provider if you often feel depressed.  Tell your health care provider if you have ever been abused or do not feel safe at home. This information is not intended to replace advice given to you by your health care provider. Make sure you discuss any questions you have with your health care provider. Document Released: 09/10/2007 Document Revised: 11/11/2015 Document Reviewed: 12/16/2014 Elsevier Interactive Patient Education  2018 [place] (AHA) Exercise Recommendation  Being physically active is important to prevent heart disease and stroke, the nation's No. 1and No. 5killers. To improve overall cardiovascular health, we suggest at least 150 minutes per week of moderate exercise or 75 minutes per week of vigorous exercise (or a combination of moderate and vigorous activity). Thirty minutes a day, five times a week is an easy goal to remember. You will also experience benefits even if you divide your time into two or three segments of 10 to 15 minutes per day.  For people who would  benefit from lowering their blood pressure or cholesterol, we recommend 40 minutes of aerobic exercise of moderate to vigorous intensity three to four times a week to lower the risk for heart attack and stroke.  Physical activity is anything that makes you move your body and burn calories.  This includes things like climbing stairs or playing sports. Aerobic exercises benefit your heart, and include walking, jogging, swimming or biking. Strength and stretching exercises are best for overall stamina and flexibility.  The simplest, positive change you can make to effectively improve your heart health is to start walking. It's enjoyable, free, easy, social and great exercise. A walking program is flexible and boasts high success rates because people can stick with it. It's easy for walking to become a regular and satisfying part of life.   For Overall Cardiovascular Health:  At least 30 minutes of moderate-intensity aerobic activity at least 5 days per week for a total of 150  OR   At least 25 minutes of vigorous aerobic activity at least 3 days per week for a total of 75 minutes; or a combination of moderate- and vigorous-intensity aerobic activity  AND   Moderate- to high-intensity muscle-strengthening activity at least 2 days per week for additional health benefits.  For Lowering Blood Pressure and Cholesterol  An average 40 minutes of moderate- to vigorous-intensity aerobic activity 3 or 4 times per week  What if I can't make it to the time goal? Something is always better than nothing! And everyone has to start somewhere. Even if you've been sedentary for years, today is the day you can begin to make healthy changes in your life. If you don't think you'll make it for 30 or 40 minutes, set a reachable goal for today. You can work up toward your overall goal by increasing your time as you get stronger. Don't let all-or-nothing thinking rob you of doing what you can every day.   Source:http://www.heart.org

## 2017-06-17 NOTE — Progress Notes (Signed)
Carlos Phillips 52 y.o.   Chief Complaint  Patient presents with  . Annual Exam    for insurance to be kept at a low rate, (form to fill out)    HISTORY OF PRESENT ILLNESS: This is a 52 y.o. male complaining of Here for annual exam; no complaints and no medical concerns.   HPI   Prior to Admission medications   Medication Sig Start Date End Date Taking? Authorizing Provider  amLODipine (NORVASC) 10 MG tablet TAKE 1 TABLET BY MOUTH ONCE DAILY 02/17/17  Yes Wardell Honour, MD  atorvastatin (LIPITOR) 20 MG tablet Take 1 tablet (20 mg total) by mouth every morning. Take one tablet by mouth once daily. 01/03/17  Yes Weber, Sarah L, PA-C  Blood Glucose Monitoring Suppl (GLUCOCOM BLOOD GLUCOSE MONITOR) DEVI 1 kit by Does not apply route once. 06/21/15  Yes Wendie Agreste, MD  cloNIDine (CATAPRES) 0.1 MG tablet TAKE ONE TABLET BY MOUTH TWICE DAILY 12/31/16  Yes Weber, Damaris Hippo, PA-C  clopidogrel (PLAVIX) 75 MG tablet TAKE 1 TABLET BY MOUTH DAILY WITH BREAKFAST 02/17/17  Yes Wardell Honour, MD  glucose blood test strip To test blood sugar once per day. Dx 250.00.  Brand per insurance coverage. 06/21/15  Yes Wendie Agreste, MD  hydrochlorothiazide (HYDRODIURIL) 25 MG tablet TAKE 1 TABLET BY MOUTH EVERY DAY 02/17/17  Yes Wardell Honour, MD  ketoconazole (NIZORAL) 2 % cream Apply 1 application topically daily. 12/31/16  Yes Weber, Damaris Hippo, PA-C  Lancet Device MISC To test blood sugar once per day. Dx 250.00.  Brand per insurance coverage. 06/21/15  Yes Wendie Agreste, MD  metFORMIN (GLUCOPHAGE) 500 MG tablet Take 1 tablet (500 mg total) by mouth 2 (two) times daily with a meal. 01/03/17  Yes Weber, Damaris Hippo, PA-C  Multiple Vitamins-Minerals (MULTIVITAMIN WITH MINERALS) tablet Take 1 tablet by mouth daily.   Yes [provider]  sildenafil (VIAGRA) 100 MG tablet Take 0.5-1 tablets (50-100 mg total) by mouth daily as needed for erectile dysfunction. 12/31/16 12/30/17 Yes Weber, Damaris Hippo,  PA-C    Allergies  Allergen Reactions  . Morphine And Related Nausea And Vomiting    Patient Active Problem List   Diagnosis Date Noted  . Hyperlipidemia 12/31/2016  . PVD (peripheral vascular disease) ([place]) 01/29/2016  . Diverticulitis 01/28/2016  . Special screening for malignant neoplasms, colon   . Type 2 diabetes mellitus with diabetic dermatitis, without long-term current use of insulin ([place]) 05/03/2015  . History of deep vein thrombosis (DVT) of lower extremity 03/07/2013  . Ischemia of extremity 02/25/2013  . HTN (hypertension) 05/07/2011  . Erectile dysfunction 05/07/2011  . Obesity 05/07/2011    Past Medical History:  Diagnosis Date  . Diabetes mellitus without complication ([place])   . DVT (deep venous thrombosis) ([place]) 02/2013  . Erectile dysfunction   . Hyperlipidemia   . Hypertension   . Obesity    BMI >52  . PVD (peripheral vascular disease) ([place])     Past Surgical History:  Procedure Laterality Date  . ABDOMINAL AORTAGRAM N/A 02/28/2013   Procedure: ABDOMINAL Maxcine Ham;  Surgeon: Conrad [place], MD;  Location: [facility] CATH LAB;  Service: Cardiovascular;  Laterality: N/A;  . COLONOSCOPY WITH PROPOFOL N/A 12/25/2015   Procedure: COLONOSCOPY WITH PROPOFOL;  Surgeon: Manus Gunning, MD;  Location: [facility] ENDOSCOPY;  Service: Gastroenterology;  Laterality: N/A;  . EMBOLECTOMY Left 02/25/2013   Procedure: Left Popliteal EMBOLECTOMY Poss fasciotomy;  Surgeon: Elam Dutch, MD;  Location: [facility] OR;  Service: Vascular;  Laterality: Left;  Left poplital and Tibial embolectomy with four compartment Fasciotomy with vein patch angioplasty left popliteal artery.    Social History   Socioeconomic History  . Marital status: Married    Spouse name: Ivin Booty  . Number of children: 2  . Years of education: Not on file  . Highest education level: Not on file  Occupational History  . 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 Financial resource strain: Not on file  . Food  insecurity:    Worry: Not on file    Inability: Not on file  . Transportation needs:    Medical: Not on file    Non-medical: Not on file  Tobacco Use  . Smoking status: Former Smoker    Packs/day: 1.00    Years: 30.00    Pack years: 30.00    Types: Cigarettes    Last attempt to quit: 02/25/2013    Years since quitting: 4.3  . Smokeless tobacco: Never Used  Substance and Sexual Activity  . Alcohol use: Yes    Alcohol/week: 2.4 oz    Types: 4 Standard drinks or equivalent per week    Comment: social- rare  . Drug use: No  . Sexual activity: Yes    Partners: Female  Lifestyle  . Physical activity:    Days per week: Not on file    Minutes per session: Not on file  . Stress: Not on file  Relationships  . Social connections:    Talks on phone: Not on file    Gets together: Not on file    Attends religious service: Not on file    Active member of club or organization: Not on file    Attends meetings of clubs or organizations: Not on file    Relationship status: Not on file  . Intimate partner violence:    Fear of current or ex partner: Not on file    Emotionally abused: Not on file    Physically abused: Not on file    Forced sexual activity: Not on file  Other Topics Concern  . Not on file  Social History Narrative  . Not on file    Family History  Problem Relation Age of Onset  . Diabetes Mother   . Heart disease Father   . Asthma Son   . Colon cancer Neg Hx      Review of Systems  Constitutional: Negative.  Negative for chills and fever.  HENT: Negative.  Negative for congestion, nosebleeds, sinus pain and sore throat.   Eyes: Negative.  Negative for blurred vision and double vision.  Respiratory: Negative.  Negative for cough and shortness of breath.   Cardiovascular: Negative.  Negative for chest pain, palpitations and leg swelling.  Gastrointestinal: Negative.  Negative for abdominal pain, diarrhea, nausea and vomiting.  Genitourinary: Negative.  Negative  for dysuria and hematuria.  Musculoskeletal: Negative.  Negative for back pain, myalgias and neck pain.  Skin: Negative.  Negative for rash.  Neurological: Negative.  Negative for dizziness and headaches.  Endo/Heme/Allergies: Negative.   All other systems reviewed and are negative.   Vitals:   06/17/17 1323  BP: 125/78  Pulse: 68  Resp: 16  Temp: 98.4 F (36.9 C)  SpO2: 99%    Physical Exam  Constitutional: He is oriented to person, place, and time. He appears well-developed.  Morbidly obese  HENT:  Head: Normocephalic and atraumatic.  Right Ear: External ear normal.  Left Ear:  External ear normal.  Nose: Nose normal.  Mouth/Throat: Oropharynx is clear and moist. No oropharyngeal exudate.  Eyes: Pupils are equal, round, and reactive to light. Conjunctivae and EOM are normal.  Neck: Normal range of motion. Neck supple.  Cardiovascular: Normal rate, regular rhythm and normal heart sounds.  Pulmonary/Chest: Effort normal and breath sounds normal.  Abdominal: Soft. There is no tenderness.  Musculoskeletal: Normal range of motion.  Lymphadenopathy:    He has no cervical adenopathy.  Neurological: He is alert and oriented to person, place, and time. No sensory deficit. He exhibits normal muscle tone.  Skin: Skin is warm and dry. Capillary refill takes less than 2 seconds. No rash noted.  Psychiatric: He has a normal mood and affect. His behavior is normal.  Vitals reviewed.    ASSESSMENT & PLAN: Jaz was seen today for annual exam.  Diagnoses and all orders for this visit:  Routine general medical examination at a health care facility -     HIV antibody -     Hemoglobin A1c -     Lipid panel -     Comprehensive metabolic panel -     CBC with Differential  Essential hypertension  Class 3 severe obesity due to excess calories with serious comorbidity and body mass index (BMI) of 50.0 to 59.9 in adult [facility])  Type 2 diabetes mellitus with diabetic dermatitis,  without long-term current use of insulin [facility])    Patient Instructions       IF you received an x-ray today, you will receive an invoice from [facility]. Please contact [facility] at [phone] with questions or concerns regarding your invoice.   IF you received labwork today, you will receive an invoice from [facility]. Please contact [facility] at [phone] with questions or concerns regarding your invoice.   Our billing staff will not be able to assist you with questions regarding bills from these companies.  You will be contacted with the lab results as soon as they are available. The fastest way to get your results is to activate your My Chart account. Instructions are located on the last page of this paperwork. If you have not heard from [place] regarding the results in 2 weeks, please contact this office.      Health Maintenance, Male A healthy lifestyle and preventive care is important for your health and wellness. Ask your health care provider about what schedule of regular examinations is right for you. What should I know about weight and diet? Eat a Healthy Diet  Eat plenty of vegetables, fruits, whole grains, low-fat dairy products, and lean protein.  Do not eat a lot of foods high in solid fats, added sugars, or salt.  Maintain a Healthy Weight Regular exercise can help you achieve or maintain a healthy weight. You should:  Do at least 150 minutes of exercise each week. The exercise should increase your heart rate and make you sweat (moderate-intensity exercise).  Do strength-training exercises at least twice a week.  Watch Your Levels of Cholesterol and Blood Lipids  Have your blood tested for lipids and cholesterol every 5 years starting at 53 years of age. If you are at high risk for heart disease, you should start having your blood tested when you are 53 years old. You may need to have your cholesterol levels checked more often if: ? Your lipid  or cholesterol levels are high. ? You are older than 53 years of age. ? You are at high risk for heart disease.  What should I know about cancer screening? Many types of cancers can be detected early and may often be prevented. Lung Cancer  You should be screened every year for lung cancer if: ? You are a current smoker who has smoked for at least 30 years. ? You are a former smoker who has quit within the past 15 years.  Talk to your health care provider about your screening options, when you should start screening, and how often you should be screened.  Colorectal Cancer  Routine colorectal cancer screening usually begins at 53 years of age and should be repeated every 5-10 years until you are 53 years old. You may need to be screened more often if early forms of precancerous polyps or small growths are found. Your health care provider may recommend screening at an earlier age if you have risk factors for colon cancer.  Your health care provider may recommend using home test kits to check for hidden blood in the stool.  A small camera at the end of a tube can be used to examine your colon (sigmoidoscopy or colonoscopy). This checks for the earliest forms of colorectal cancer.  Prostate and Testicular Cancer  Depending on your age and overall health, your health care provider may do certain tests to screen for prostate and testicular cancer.  Talk to your health care provider about any symptoms or concerns you have about testicular or prostate cancer.  Skin Cancer  Check your skin from head to toe regularly.  Tell your health care provider about any new moles or changes in moles, especially if: ? There is a change in a mole's size, shape, or color. ? You have a mole that is larger than a pencil eraser.  Always use sunscreen. Apply sunscreen liberally and repeat throughout the day.  Protect yourself by wearing long sleeves, pants, a wide-brimmed hat, and sunglasses when  outside.  What should I know about heart disease, diabetes, and high blood pressure?  If you are 73-42 years of age, have your blood pressure checked every 3-5 years. If you are 62 years of age or older, have your blood pressure checked every year. You should have your blood pressure measured twice-once when you are at a hospital or clinic, and once when you are not at a hospital or clinic. Record the average of the two measurements. To check your blood pressure when you are not at a hospital or clinic, you can use: ? An automated blood pressure machine at a pharmacy. ? A home blood pressure monitor.  Talk to your health care provider about your target blood pressure.  If you are between 63-73 years old, ask your health care provider if you should take aspirin to prevent heart disease.  Have regular diabetes screenings by checking your fasting blood sugar level. ? If you are at a normal weight and have a low risk for diabetes, have this test once every three years after the age of 64. ? If you are overweight and have a high risk for diabetes, consider being tested at a younger age or more often.  A one-time screening for abdominal aortic aneurysm (AAA) by ultrasound is recommended for men aged 3-75 years who are current or former smokers. What should I know about preventing infection? Hepatitis B If you have a higher risk for hepatitis B, you should be screened for this virus. Talk with your health care provider to find out if you are at risk for hepatitis B infection. Hepatitis C  Blood testing is recommended for:  Everyone born from [birth year] through 1966.  Anyone with known risk factors for hepatitis C.  Sexually Transmitted Diseases (STDs)  You should be screened each year for STDs including gonorrhea and chlamydia if: ? You are sexually active and are younger than 53 years of age. ? You are older than 54 years of age and your health care provider tells you that you are at risk for this  type of infection. ? Your sexual activity has changed since you were last screened and you are at an increased risk for chlamydia or gonorrhea. Ask your health care provider if you are at risk.  Talk with your health care provider about whether you are at high risk of being infected with HIV. Your health care provider may recommend a prescription medicine to help prevent HIV infection.  What else can I do?  Schedule regular health, dental, and eye exams.  Stay current with your vaccines (immunizations).  Do not use any tobacco products, such as cigarettes, chewing tobacco, and e-cigarettes. If you need help quitting, ask your health care provider.  Limit alcohol intake to no more than 2 drinks per day. One drink equals 12 ounces of beer, 5 ounces of wine, or 1 ounces of hard liquor.  Do not use street drugs.  Do not share needles.  Ask your health care provider for help if you need support or information about quitting drugs.  Tell your health care provider if you often feel depressed.  Tell your health care provider if you have ever been abused or do not feel safe at home. This information is not intended to replace advice given to you by your health care provider. Make sure you discuss any questions you have with your health care provider. Document Released: 09/10/2007 Document Revised: 11/11/2015 Document Reviewed: 12/16/2014 Elsevier Interactive Patient Education  2018 [place] (AHA) Exercise Recommendation  Being physically active is important to prevent heart disease and stroke, the nation's No. 1and No. 5killers. To improve overall cardiovascular health, we suggest at least 150 minutes per week of moderate exercise or 75 minutes per week of vigorous exercise (or a combination of moderate and vigorous activity). Thirty minutes a day, five times a week is an easy goal to remember. You will also experience benefits even if you divide your time into  two or three segments of 10 to 15 minutes per day.  For people who would benefit from lowering their blood pressure or cholesterol, we recommend 40 minutes of aerobic exercise of moderate to vigorous intensity three to four times a week to lower the risk for heart attack and stroke.  Physical activity is anything that makes you move your body and burn calories.  This includes things like climbing stairs or playing sports. Aerobic exercises benefit your heart, and include walking, jogging, swimming or biking. Strength and stretching exercises are best for overall stamina and flexibility.  The simplest, positive change you can make to effectively improve your heart health is to start walking. It's enjoyable, free, easy, social and great exercise. A walking program is flexible and boasts high success rates because people can stick with it. It's easy for walking to become a regular and satisfying part of life.   For Overall Cardiovascular Health:  At least 30 minutes of moderate-intensity aerobic activity at least 5 days per week for a total of 150  OR   At least 25 minutes of vigorous aerobic activity at least  3 days per week for a total of 75 minutes; or a combination of moderate- and vigorous-intensity aerobic activity  AND   Moderate- to high-intensity muscle-strengthening activity at least 2 days per week for additional health benefits.  For Lowering Blood Pressure and Cholesterol  An average 40 minutes of moderate- to vigorous-intensity aerobic activity 3 or 4 times per week  What if I can't make it to the time goal? Something is always better than nothing! And everyone has to start somewhere. Even if you've been sedentary for years, today is the day you can begin to make healthy changes in your life. If you don't think you'll make it for 30 or 40 minutes, set a reachable goal for today. You can work up toward your overall goal by increasing your time as you get stronger. Don't let  all-or-nothing thinking rob you of doing what you can every day.  Source:http://www.heart.Burnadette Pop, MD [facility]

## 2017-06-19 ENCOUNTER — Encounter: Payer: Self-pay | Admitting: *Deleted

## 2017-06-19 LAB — CBC WITH DIFFERENTIAL/PLATELET
Basophils Absolute: 0 10*3/uL (ref 0.0–0.2)
Basos: 0 %
EOS (ABSOLUTE): 1.6 10*3/uL — ABNORMAL HIGH (ref 0.0–0.4)
Eos: 18 %
Hematocrit: 45.8 % (ref 37.5–51.0)
Hemoglobin: 15.1 g/dL (ref 13.0–17.7)
Immature Grans (Abs): 0 10*3/uL (ref 0.0–0.1)
Immature Granulocytes: 0 %
Lymphocytes Absolute: 2.7 10*3/uL (ref 0.7–3.1)
Lymphs: 31 %
MCH: 28.7 pg (ref 26.6–33.0)
MCHC: 33 g/dL (ref 31.5–35.7)
MCV: 87 fL (ref 79–97)
Monocytes Absolute: 0.7 10*3/uL (ref 0.1–0.9)
Monocytes: 8 %
Neutrophils Absolute: 3.9 10*3/uL (ref 1.4–7.0)
Neutrophils: 43 %
Platelets: 290 10*3/uL (ref 150–379)
RBC: 5.26 x10E6/uL (ref 4.14–5.80)
RDW: 15.3 % (ref 12.3–15.4)
WBC: 8.9 10*3/uL (ref 3.4–10.8)

## 2017-06-19 LAB — COMPREHENSIVE METABOLIC PANEL
ALT: 17 IU/L (ref 0–44)
AST: 13 IU/L (ref 0–40)
Albumin/Globulin Ratio: 1.6 (ref 1.2–2.2)
Albumin: 3.9 g/dL (ref 3.5–5.5)
Alkaline Phosphatase: 78 IU/L (ref 39–117)
BUN/Creatinine Ratio: 12 (ref 9–20)
BUN: 14 mg/dL (ref 6–24)
Bilirubin Total: 0.3 mg/dL (ref 0.0–1.2)
CO2: 23 mmol/L (ref 20–29)
Calcium: 9.9 mg/dL (ref 8.7–10.2)
Chloride: 105 mmol/L (ref 96–106)
Creatinine, Ser: 1.18 mg/dL (ref 0.76–1.27)
GFR calc Af Amer: 82 mL/min/{1.73_m2} (ref >59)
GFR calc non Af Amer: 71 mL/min/{1.73_m2} (ref >59)
Globulin, Total: 2.5 g/dL (ref 1.5–4.5)
Glucose: 94 mg/dL (ref 65–99)
Potassium: 4.4 mmol/L (ref 3.5–5.2)
Sodium: 143 mmol/L (ref 134–144)
Total Protein: 6.4 g/dL (ref 6.0–8.5)

## 2017-06-19 LAB — LIPID PANEL
Chol/HDL Ratio: 3.4 ratio (ref 0.0–5.0)
Cholesterol, Total: 153 mg/dL (ref 100–199)
HDL: 45 mg/dL (ref >39)
LDL Calculated: 83 mg/dL (ref 0–99)
Triglycerides: 127 mg/dL (ref 0–149)
VLDL Cholesterol Cal: 25 mg/dL (ref 5–40)

## 2017-06-19 LAB — HEMOGLOBIN A1C
Est. average glucose Bld gHb Est-mCnc: 143 mg/dL
Hgb A1c MFr Bld: 6.6 % — ABNORMAL HIGH (ref 4.8–5.6)

## 2017-06-19 LAB — HIV ANTIBODY (ROUTINE TESTING W REFLEX): HIV Screen 4th Generation wRfx: NONREACTIVE

## 2017-06-21 ENCOUNTER — Telehealth: Payer: Self-pay | Admitting: *Deleted

## 2017-06-21 NOTE — Telephone Encounter (Signed)
Spoke to patient he will come to the office to sign completed form, then it should be faxed to [employer].

## 2017-08-07 ENCOUNTER — Other Ambulatory Visit: Payer: Self-pay | Admitting: Physician Assistant

## 2017-08-07 ENCOUNTER — Other Ambulatory Visit: Payer: Self-pay | Admitting: Family Medicine

## 2017-08-07 DIAGNOSIS — I10 Essential (primary) hypertension: Secondary | ICD-10-CM

## 2017-08-07 DIAGNOSIS — E782 Mixed hyperlipidemia: Secondary | ICD-10-CM

## 2017-08-07 NOTE — Telephone Encounter (Signed)
PCP is listed as Dr. Carlota Raspberry / Cassell Clement 06-17-17 with Dr. Mitchel Honour for a annual exam / Refill request for atorvastatin / Last written by Windell Hummingbird atorvastatin (LIPITOR) 20 MG tablet 90 tablet 0 01/03/2017   / Please refill medication is appropriate by Dr. Mitchel Honour

## 2017-08-14 ENCOUNTER — Other Ambulatory Visit: Payer: Self-pay

## 2017-08-14 ENCOUNTER — Emergency Department (HOSPITAL_COMMUNITY): Payer: 59

## 2017-08-14 ENCOUNTER — Emergency Department (HOSPITAL_COMMUNITY)
Admission: EM | Admit: 2017-08-14 | Discharge: 2017-08-14 | Disposition: A | Discharge status: Home / Self Care | Payer: 59 | Attending: Emergency Medicine | Admitting: Emergency Medicine

## 2017-08-14 ENCOUNTER — Encounter (HOSPITAL_COMMUNITY): Payer: Self-pay

## 2017-08-14 DIAGNOSIS — I10 Essential (primary) hypertension: Secondary | ICD-10-CM | POA: Insufficient documentation

## 2017-08-14 DIAGNOSIS — Z87891 Personal history of nicotine dependence: Secondary | ICD-10-CM | POA: Insufficient documentation

## 2017-08-14 DIAGNOSIS — Z79899 Other long term (current) drug therapy: Secondary | ICD-10-CM | POA: Insufficient documentation

## 2017-08-14 DIAGNOSIS — Z7902 Long term (current) use of antithrombotics/antiplatelets: Secondary | ICD-10-CM | POA: Insufficient documentation

## 2017-08-14 DIAGNOSIS — E119 Type 2 diabetes mellitus without complications: Secondary | ICD-10-CM | POA: Diagnosis not present

## 2017-08-14 DIAGNOSIS — M436 Torticollis: Secondary | ICD-10-CM | POA: Insufficient documentation

## 2017-08-14 DIAGNOSIS — Z7984 Long term (current) use of oral hypoglycemic drugs: Secondary | ICD-10-CM | POA: Insufficient documentation

## 2017-08-14 DIAGNOSIS — M542 Cervicalgia: Secondary | ICD-10-CM | POA: Diagnosis not present

## 2017-08-14 MED ORDER — TRAMADOL HCL 50 MG PO TABS: 50.0000 mg | ORAL_TABLET | Freq: Four times a day (QID) | ORAL | 0 refills | Status: DC | PRN

## 2017-08-14 MED ORDER — CYCLOBENZAPRINE HCL 10 MG PO TABS: 10.0000 mg | ORAL_TABLET | Freq: Two times a day (BID) | ORAL | 0 refills | Status: DC | PRN

## 2017-08-14 MED ORDER — IBUPROFEN 200 MG PO TABS
600.0000 mg | ORAL_TABLET | Freq: Once | ORAL | Status: AC
  Administered 2017-08-14: 600 mg via ORAL
  Filled 2017-08-14: qty 3

## 2017-08-14 NOTE — Discharge Instructions (Signed)
Take the medications as needed for pain, you can also take over-the-counter ibuprofen or naproxen.  Consider physical therapy, massage or chiropractic treatment.  Return for fever, acute numbness, weakness or other concerning symptoms

## 2017-08-14 NOTE — ED Provider Notes (Signed)
[facility] DEPT Provider Note   CSN: [id number] Arrival date & time: 08/14/17  0709     History   Chief Complaint Chief Complaint  Patient presents with  . Neck Pain    HPI Carlos Phillips is a 52 y.o. male.  The history is provided by the patient.  Neck Pain   This is a new problem. The current episode started more than 2 days ago. The problem occurs constantly. The problem has been gradually worsening. The pain is associated with nothing. There has been no fever. The pain is present in the generalized neck. Quality: sharp, aching. The pain does not radiate. The pain is moderate. The symptoms are aggravated by twisting and swallowing. Pertinent negatives include no photophobia, no visual change, no chest pain, no syncope, no numbness, no weight loss, no headaches, no bowel incontinence, no bladder incontinence, no leg pain, no paresis, no tingling and no weakness. He has tried analgesics for the symptoms. The treatment provided mild relief.  Patient states that he woke up a few days ago with pain in his neck.  He started noticed some improvement on Saturday after taking NSAIDs however Sunday morning he felt like his symptoms were severe again.  His neck feels stiff and any movement exacerbates the pain.  Past Medical History:  Diagnosis Date  . Diabetes mellitus without complication ([place])   . DVT (deep venous thrombosis) ([place]) 02/2013  . Erectile dysfunction   . Hyperlipidemia   . Hypertension   . Obesity    BMI >52  . PVD (peripheral vascular disease) [facility])     Patient Active Problem List   Diagnosis Date Noted  . Hyperlipidemia 12/31/2016  . PVD (peripheral vascular disease) ([place]) 01/29/2016  . Diverticulitis 01/28/2016  . Special screening for malignant neoplasms, colon   . Type 2 diabetes mellitus with diabetic dermatitis, without long-term current use of insulin ([place]) 05/03/2015  . History of deep vein thrombosis (DVT) of lower  extremity 03/07/2013  . Ischemia of extremity 02/25/2013  . HTN (hypertension) 05/07/2011  . Erectile dysfunction 05/07/2011  . Obesity 05/07/2011    Past Surgical History:  Procedure Laterality Date  . ABDOMINAL AORTAGRAM N/A 02/28/2013   Procedure: ABDOMINAL Maxcine Ham;  Surgeon: Conrad Anchorage, MD;  Location: [facility] CATH LAB;  Service: Cardiovascular;  Laterality: N/A;  . COLONOSCOPY WITH PROPOFOL N/A 12/25/2015   Procedure: COLONOSCOPY WITH PROPOFOL;  Surgeon: Manus Gunning, MD;  Location: [facility] ENDOSCOPY;  Service: Gastroenterology;  Laterality: N/A;  . EMBOLECTOMY Left 02/25/2013   Procedure: Left Popliteal EMBOLECTOMY Poss fasciotomy;  Surgeon: Elam Dutch, MD;  Location: [facility] OR;  Service: Vascular;  Laterality: Left;  Left poplital and Tibial embolectomy with four compartment Fasciotomy with vein patch angioplasty left popliteal artery.        Home Medications    Prior to Admission medications   Medication Sig Start Date End Date Taking? Authorizing Provider  amLODipine (NORVASC) 10 MG tablet TAKE 1 TABLET BY MOUTH ONCE DAILY 02/17/17  Yes Wardell Honour, MD  atorvastatin (LIPITOR) 20 MG tablet TAKE 1 TABLET(20 MG) BY MOUTH EVERY MORNING 08/10/17  Yes Sagardia, Ines Bloomer, MD  cloNIDine (CATAPRES) 0.1 MG tablet TAKE ONE TABLET BY MOUTH TWICE DAILY 12/31/16  Yes Weber, Damaris Hippo, PA-C  clopidogrel (PLAVIX) 75 MG tablet TAKE 1 TABLET BY MOUTH DAILY WITH BREAKFAST 02/17/17  Yes Wardell Honour, MD  hydrochlorothiazide (HYDRODIURIL) 25 MG tablet TAKE 1 TABLET BY MOUTH EVERY DAY 08/08/17  Yes Carlota Raspberry,  Ranell Patrick, MD  metFORMIN (GLUCOPHAGE) 500 MG tablet Take 1 tablet (500 mg total) by mouth 2 (two) times daily with a meal. 01/03/17  Yes Weber, Sarah L, PA-C  Multiple Vitamins-Minerals (MULTIVITAMIN WITH MINERALS) tablet Take 1 tablet by mouth daily.   Yes [provider]  sildenafil (VIAGRA) 100 MG tablet Take 0.5-1 tablets (50-100 mg total) by mouth daily as needed for erectile  dysfunction. 12/31/16 12/30/17 Yes Weber, Sarah L, PA-C  Blood Glucose Monitoring Suppl (GLUCOCOM BLOOD GLUCOSE MONITOR) DEVI 1 kit by Does not apply route once. 06/21/15   Wendie Agreste, MD  cyclobenzaprine (FLEXERIL) 10 MG tablet Take 1 tablet (10 mg total) by mouth 2 (two) times daily as needed for muscle spasms. 08/14/17   Dorie Rank, MD  glucose blood test strip To test blood sugar once per day. Dx 250.00.  Brand per insurance coverage. 06/21/15   Wendie Agreste, MD  ketoconazole (NIZORAL) 2 % cream Apply 1 application topically daily. Patient not taking: Reported on 08/14/2017 12/31/16   Mancel Bale, PA-C  Lancet Device MISC To test blood sugar once per day. Dx 250.00.  Brand per insurance coverage. 06/21/15   Wendie Agreste, MD  traMADol (ULTRAM) 50 MG tablet Take 1 tablet (50 mg total) by mouth every 6 (six) hours as needed. 08/14/17   Dorie Rank, MD    Family History Family History  Problem Relation Age of Onset  . Diabetes Mother   . Heart disease Father   . Asthma Son   . Colon cancer Neg Hx     Social History Social History   Tobacco Use  . Smoking status: Former Smoker    Packs/day: 1.00    Years: 30.00    Pack years: 30.00    Types: Cigarettes    Last attempt to quit: 02/25/2013    Years since quitting: 4.4  . Smokeless tobacco: Never Used  Substance Use Topics  . Alcohol use: Yes    Alcohol/week: 2.4 oz    Types: 4 Standard drinks or equivalent per week    Comment: social- rare  . Drug use: No     Allergies   Morphine and related   Review of Systems Review of Systems  Constitutional: Negative for fever and weight loss.  Eyes: Negative for photophobia.  Respiratory: Negative for shortness of breath.   Cardiovascular: Negative for chest pain and syncope.  Gastrointestinal: Negative for bowel incontinence.  Genitourinary: Negative for bladder incontinence.  Musculoskeletal: Positive for neck pain.  Neurological: Negative for tingling, weakness,  numbness and headaches.     Physical Exam Updated Vital Signs BP 119/86   Pulse 71   Temp 97.8 F (36.6 C) (Oral)   Resp 16   Ht 1.829 m (6')   Wt (!) 179.2 kg (395 lb)   SpO2 99%   BMI 53.57 kg/m   Physical Exam  Constitutional: He appears well-developed and well-nourished. No distress.  HENT:  Head: Normocephalic and atraumatic.  Right Ear: External ear normal.  Left Ear: External ear normal.  Mouth/Throat: No oral lesions. No trismus in the jaw.  Eyes: Conjunctivae are normal. Right eye exhibits no discharge. Left eye exhibits no discharge. No scleral icterus.  Neck: Neck supple. No spinous process tenderness present. No neck rigidity. Decreased range of motion present. No tracheal deviation and no erythema present.  Cardiovascular: Normal rate, regular rhythm and intact distal pulses.  Pulmonary/Chest: Effort normal and breath sounds normal. No stridor. No respiratory distress. He has no  wheezes. He has no rales.  Abdominal: Soft. Bowel sounds are normal. He exhibits no distension. There is no tenderness. There is no rebound and no guarding.  Musculoskeletal: He exhibits no edema or tenderness.  Lymphadenopathy:    He has no cervical adenopathy.  Neurological: He is alert. He has normal strength. No cranial nerve deficit (no facial droop, extraocular movements intact, no slurred speech) or sensory deficit. He exhibits normal muscle tone. He displays no seizure activity. Coordination normal.  Skin: Skin is warm and dry. No rash noted.  Psychiatric: He has a normal mood and affect.  Nursing note and vitals reviewed.    ED Treatments / Results   Radiology Dg Cervical Spine Complete  Result Date: 08/14/2017 CLINICAL DATA:  Cervicalgia EXAM: CERVICAL SPINE - COMPLETE 4+ VIEW COMPARISON:  None. FINDINGS: Frontal, lateral, open-mouth odontoid, and bilateral oblique views were obtained. There is no fracture or spondylolisthesis. Prevertebral soft tissues and predental space  regions are normal. The disc spaces appear unremarkable. There is no appreciable exit foraminal narrowing on the oblique views. Lung apices are clear. IMPRESSION: No fracture or spondylolisthesis. No appreciable arthropathic change. Electronically Signed   By: Lowella Grip III M.D.   On: 08/14/2017 08:28    Procedures Procedures (including critical care time)  Medications Ordered in ED Medications  ibuprofen (ADVIL,MOTRIN) tablet 600 mg (600 mg Oral Given 08/14/17 0803)     Initial Impression / Assessment and Plan / ED Course  I have reviewed the triage vital signs and the nursing notes.  Pertinent labs & imaging results that were available during my care of the patient were reviewed by me and considered in my medical decision making (see chart for details).  Clinical Course as of Aug 14 853  Mon Aug 14, 2017  9675 No acute findings noted on x-ray   [JK]    Clinical Course User Index [JK] Dorie Rank, MD   Patient symptoms are suggestive of torticollis.  He has muscular tenderness on exam.  Patient does not have any fevers or chills to suggest an infectious etiology.  He does not have any numbness or weakness or radicular symptoms.  Plan on discharge home with pain medications and muscle relaxants.  Discussed outpatient follow-up, warning signs and precautions. Final Clinical Impressions(s) / ED Diagnoses   Final diagnoses:  Torticollis    ED Discharge Orders        Ordered    traMADol (ULTRAM) 50 MG tablet  Every 6 hours PRN     08/14/17 0853    cyclobenzaprine (FLEXERIL) 10 MG tablet  2 times daily PRN     08/14/17 9163       Dorie Rank, MD 08/14/17 [id number]

## 2017-08-14 NOTE — ED Triage Notes (Signed)
Patient c/o right posterior neck pain x3 days. Patient denies any numbness or tingling of extremities.

## 2017-08-14 NOTE — ED Notes (Signed)
Pt  has been gowned, urine is in a cup in the room waiting for orders.

## 2017-08-21 ENCOUNTER — Encounter: Payer: Self-pay | Admitting: Family Medicine

## 2017-09-08 ENCOUNTER — Other Ambulatory Visit: Payer: Self-pay | Admitting: Family Medicine

## 2017-09-08 DIAGNOSIS — I10 Essential (primary) hypertension: Secondary | ICD-10-CM

## 2017-09-09 NOTE — Telephone Encounter (Signed)
Had physical with blood work in March, amlodipine refill granted.

## 2017-10-13 ENCOUNTER — Other Ambulatory Visit: Payer: Self-pay | Admitting: Family Medicine

## 2017-10-13 DIAGNOSIS — I10 Essential (primary) hypertension: Secondary | ICD-10-CM

## 2017-10-13 DIAGNOSIS — I739 Peripheral vascular disease, unspecified: Secondary | ICD-10-CM

## 2017-10-13 NOTE — Telephone Encounter (Signed)
Plavix 75 mg refill request  LOV 06/17/17 with Dr. Carlota Raspberry.  Last refill:  02/17/17  #30   Refills:  4  [facility] [place], [place]

## 2017-10-15 NOTE — Telephone Encounter (Signed)
Refilled but please schedule follow up with me as I have not seen him since 2018. Has seen other providers for OV an CPE in March.  Schedule appointment in next 2 months.

## 2017-11-19 ENCOUNTER — Other Ambulatory Visit: Payer: Self-pay | Admitting: Emergency Medicine

## 2017-11-19 ENCOUNTER — Other Ambulatory Visit: Payer: Self-pay | Admitting: Family Medicine

## 2017-11-19 DIAGNOSIS — I10 Essential (primary) hypertension: Secondary | ICD-10-CM

## 2017-11-19 DIAGNOSIS — E782 Mixed hyperlipidemia: Secondary | ICD-10-CM

## 2017-12-21 ENCOUNTER — Telehealth: Payer: Self-pay | Admitting: *Deleted

## 2017-12-21 ENCOUNTER — Other Ambulatory Visit: Payer: Self-pay | Admitting: Family Medicine

## 2017-12-21 DIAGNOSIS — E782 Mixed hyperlipidemia: Secondary | ICD-10-CM

## 2017-12-21 DIAGNOSIS — I10 Essential (primary) hypertension: Secondary | ICD-10-CM

## 2017-12-21 NOTE — Telephone Encounter (Signed)
Left message 30 day supply given but will need an appointment before refills run out.

## 2017-12-31 ENCOUNTER — Other Ambulatory Visit: Payer: Self-pay | Admitting: Family Medicine

## 2017-12-31 DIAGNOSIS — I10 Essential (primary) hypertension: Secondary | ICD-10-CM

## 2018-01-01 NOTE — Telephone Encounter (Signed)
Attempted to contact pt regarding prescription refill request; pt last seen in office 06/17/17; no upcoming appointments noted; left message on voicemail [phone] for pt to call office; when pt calls back please make appointment.   Requested Prescriptions  Pending Prescriptions Disp Refills  . hydrochlorothiazide (HYDRODIURIL) 25 MG tablet [Pharmacy Med Name: HYDROCHLOROTHIAZIDE 25MG  TABLETS] 30 tablet 0    Sig: TAKE 1 TABLET BY MOUTH EVERY DAY     Cardiovascular: Diuretics - Thiazide Failed - 12/31/2017 12:51 PM      Failed - Valid encounter within last 6 months    Recent Outpatient Visits          6 months ago Routine general medical examination at a health care facility   [facility], Ines Bloomer, MD   8 months ago Encounter for examination required by Department of Transportation (DOT)   [facility], Audrie Lia, PA-C   1 year ago Essential hypertension   [facility], Damaris Hippo, PA-C   1 year ago Physical examination of employee   [facility] [place], Ines Bloomer, MD   1 year ago Physical examination of employee   [facility], Ranell Patrick, MD             Passed - Ca in normal range and within 360 days    Calcium  Date Value Ref Range Status  06/17/2017 9.9 8.7 - 10.2 mg/dL Final         Passed - Cr in normal range and within 360 days    Creat  Date Value Ref Range Status  01/28/2016 1.44 (H) 0.70 - 1.33 mg/dL Final    Comment:      For patients > or = 53 years of age: The upper reference limit for Creatinine is approximately 13% higher for people identified as African-American.      Creatinine, Ser  Date Value Ref Range Status  06/17/2017 1.18 0.76 - 1.27 mg/dL Final         Passed - K in normal range and within 360 days    Potassium  Date Value Ref Range Status  06/17/2017 4.4 3.5 - 5.2 mmol/L Final         Passed - Na in normal range and within 360 days    Sodium  Date Value  Ref Range Status  06/17/2017 143 134 - 144 mmol/L Final         Passed - Last BP in normal range    BP Readings from Last 1 Encounters:  08/14/17 119/86

## 2018-02-11 ENCOUNTER — Other Ambulatory Visit: Payer: Self-pay | Admitting: Emergency Medicine

## 2018-02-11 DIAGNOSIS — I10 Essential (primary) hypertension: Secondary | ICD-10-CM

## 2018-02-12 NOTE — Telephone Encounter (Signed)
Courtesy refill of 6 tablets given until his appointment on 02/17/18. LOV  06/17/17 Requested Prescriptions  Pending Prescriptions Disp Refills  . hydrochlorothiazide (HYDRODIURIL) 25 MG tablet [Pharmacy Med Name: HYDROCHLOROTHIAZIDE 25MG  TABLETS] 6 tablet 0    Sig: TAKE 1 TABLET BY MOUTH EVERY DAY     Cardiovascular: Diuretics - Thiazide Failed - 02/11/2018  3:47 AM      Failed - Valid encounter within last 6 months    Recent Outpatient Visits          8 months ago Routine general medical examination at a health care facility   [facility] at [facility], Ines Bloomer, MD   9 months ago Encounter for examination required by Department of Transportation (DOT)   [facility], Audrie Lia, PA-C   1 year ago Essential hypertension   [facility], Damaris Hippo, PA-C   1 year ago Physical examination of employee   [facility] [place], Ines Bloomer, MD   1 year ago Physical examination of employee   [facility], Ranell Patrick, MD      Future Appointments            In 5 days Emeterio Reeve, DO [facility] at [place], [place] in normal range and within 360 days    Calcium  Date Value Ref Range Status  06/17/2017 9.9 8.7 - 10.2 mg/dL Final         Passed - Cr in normal range and within 360 days    Creat  Date Value Ref Range Status  01/28/2016 1.44 (H) 0.70 - 1.33 mg/dL Final    Comment:      For patients > or = 53 years of age: The upper reference limit for Creatinine is approximately 13% higher for people identified as African-American.      Creatinine, Ser  Date Value Ref Range Status  06/17/2017 1.18 0.76 - 1.27 mg/dL Final         Passed - K in normal range and within 360 days    Potassium  Date Value Ref Range Status  06/17/2017 4.4 3.5 - 5.2 mmol/L Final         Passed - Na in normal range and within 360 days    Sodium  Date Value Ref Range Status  06/17/2017 143 134 - 144 mmol/L  Final         Passed - Last BP in normal range    BP Readings from Last 1 Encounters:  08/14/17 119/86

## 2018-02-15 ENCOUNTER — Other Ambulatory Visit: Payer: Self-pay | Admitting: Emergency Medicine

## 2018-02-15 DIAGNOSIS — I10 Essential (primary) hypertension: Secondary | ICD-10-CM

## 2018-02-17 ENCOUNTER — Ambulatory Visit (INDEPENDENT_AMBULATORY_CARE_PROVIDER_SITE_OTHER): Payer: 59 | Admitting: Osteopathic Medicine

## 2018-02-17 ENCOUNTER — Other Ambulatory Visit: Payer: Self-pay

## 2018-02-17 ENCOUNTER — Encounter: Payer: Self-pay | Admitting: Osteopathic Medicine

## 2018-02-17 VITALS — BP 129/85 | HR 68 | Temp 98.5°F | Resp 20 | Ht 71.93 in | Wt 394.6 lb

## 2018-02-17 DIAGNOSIS — E11620 Type 2 diabetes mellitus with diabetic dermatitis: Secondary | ICD-10-CM

## 2018-02-17 DIAGNOSIS — N529 Male erectile dysfunction, unspecified: Secondary | ICD-10-CM

## 2018-02-17 DIAGNOSIS — I739 Peripheral vascular disease, unspecified: Secondary | ICD-10-CM | POA: Diagnosis not present

## 2018-02-17 DIAGNOSIS — E119 Type 2 diabetes mellitus without complications: Secondary | ICD-10-CM

## 2018-02-17 DIAGNOSIS — I10 Essential (primary) hypertension: Secondary | ICD-10-CM | POA: Diagnosis not present

## 2018-02-17 DIAGNOSIS — E782 Mixed hyperlipidemia: Secondary | ICD-10-CM

## 2018-02-17 MED ORDER — AMLODIPINE BESYLATE 10 MG PO TABS: 10.0000 mg | ORAL_TABLET | Freq: Every day | ORAL | 0 refills | Status: DC

## 2018-02-17 MED ORDER — GLUCOSE BLOOD VI STRP: ORAL_STRIP | 12 refills | Status: DC

## 2018-02-17 MED ORDER — LANCET DEVICE MISC: 6 refills | Status: DC

## 2018-02-17 MED ORDER — METFORMIN HCL 500 MG PO TABS: 500.0000 mg | ORAL_TABLET | Freq: Two times a day (BID) | ORAL | 0 refills | Status: DC

## 2018-02-17 MED ORDER — CLOPIDOGREL BISULFATE 75 MG PO TABS: 75.0000 mg | ORAL_TABLET | Freq: Every day | ORAL | 0 refills | Status: DC

## 2018-02-17 MED ORDER — HYDROCHLOROTHIAZIDE 25 MG PO TABS: 25.0000 mg | ORAL_TABLET | Freq: Every day | ORAL | 0 refills | Status: DC

## 2018-02-17 MED ORDER — CLONIDINE HCL 0.1 MG PO TABS: ORAL_TABLET | ORAL | 0 refills | Status: DC

## 2018-02-17 MED ORDER — ATORVASTATIN CALCIUM 20 MG PO TABS: 20.0000 mg | ORAL_TABLET | Freq: Every morning | ORAL | 0 refills | Status: DC

## 2018-02-17 MED ORDER — SILDENAFIL CITRATE 100 MG PO TABS
50.0000 mg | ORAL_TABLET | Freq: Every day | ORAL | 0 refills | Status: DC | PRN
Start: 2018-02-17 — End: 2019-06-12

## 2018-02-17 NOTE — Patient Instructions (Addendum)
Maintenance medications refilled today  For any chronic pain management, please follow-up with your primary care doctor  It is important that you schedule regular follow-ups with your primary care provider to monitor your chronic medical conditions such as diabetes, high blood pressure. Before you leave the office today, please schedule an appointment to follow-up in the next 3 months with Dr. Carlota Raspberry.        If you have lab work done today you will be contacted with your lab results within the next 2 weeks.  If you have not heard from [place] then please contact us. The fastest way to get your results is to register for My Chart.   IF you received an x-ray today, you will receive an invoice from [facility]. Please contact [facility] at [phone] with questions or concerns regarding your invoice.   IF you received labwork today, you will receive an invoice from [place]. Please contact [facility] at [phone] with questions or concerns regarding your invoice.   Our billing staff will not be able to assist you with questions regarding bills from these companies.  You will be contacted with the lab results as soon as they are available. The fastest way to get your results is to activate your My Chart account. Instructions are located on the last page of this paperwork. If you have not heard from [place] regarding the results in 2 weeks, please contact this office.

## 2018-02-17 NOTE — Progress Notes (Signed)
HPI: Carlos Phillips is a 52 y.o. male who  has a past medical history of Diabetes mellitus without complication ([place]), DVT (deep venous thrombosis) ([place]) (02/2013), Erectile dysfunction, Hyperlipidemia, Hypertension, Obesity, and PVD (peripheral vascular disease) ([place]).  he presents to [place] at [facility] today, 02/17/18,  for chief complaint of: Med Refills -see headings below  Hypertension: Well-controlled on current no chest pain, pressure, shortness of breath.  History of peripheral vascular disease and DVT, stent.  Patient is taking Plavix, no easy bleeding in the stool.  Diabetes: Consistent about checking home sugars but these typically run in the 120s.  Patient is on metformin 500 bid  HLD - needs refills on statin   ED - needs refills on viagra, requests higher quantity to dispense, last time was only given #3    Past medical, surgical, social and family history reviewed:  Patient Active Problem List   Diagnosis Date Noted  . Hyperlipidemia 12/31/2016  . PVD (peripheral vascular disease) ([place]) 01/29/2016  . Diverticulitis 01/28/2016  . Special screening for malignant neoplasms, colon   . Type 2 diabetes mellitus with diabetic dermatitis, without long-term current use of insulin ([place]) 05/03/2015  . History of deep vein thrombosis (DVT) of lower extremity 03/07/2013  . Ischemia of extremity 02/25/2013  . HTN (hypertension) 05/07/2011  . Erectile dysfunction 05/07/2011  . Obesity 05/07/2011    Past Surgical History:  Procedure Laterality Date  . ABDOMINAL AORTAGRAM N/A 02/28/2013   Procedure: ABDOMINAL Maxcine Ham;  Surgeon: Conrad St. Albans, MD;  Location: [facility] CATH LAB;  Service: Cardiovascular;  Laterality: N/A;  . COLONOSCOPY WITH PROPOFOL N/A 12/25/2015   Procedure: COLONOSCOPY WITH PROPOFOL;  Surgeon: Manus Gunning, MD;  Location: [facility] ENDOSCOPY;  Service: Gastroenterology;  Laterality: N/A;  . EMBOLECTOMY Left 02/25/2013   Procedure: Left Popliteal  EMBOLECTOMY Poss fasciotomy;  Surgeon: Elam Dutch, MD;  Location: [facility] OR;  Service: Vascular;  Laterality: Left;  Left poplital and Tibial embolectomy with four compartment Fasciotomy with vein patch angioplasty left popliteal artery.    Social History   Tobacco Use  . Smoking status: Former Smoker    Packs/day: 1.00    Years: 30.00    Pack years: 30.00    Types: Cigarettes    Last attempt to quit: 02/25/2013    Years since quitting: 4.9  . Smokeless tobacco: Never Used  Substance Use Topics  . Alcohol use: Yes    Alcohol/week: 4.0 standard drinks    Types: 4 Standard drinks or equivalent per week    Comment: social- rare    Family History  Problem Relation Age of Onset  . Diabetes Mother   . Heart disease Father   . Asthma Son   . Colon cancer Neg Hx      Current medication list and allergy/intolerance information reviewed:    Current Outpatient Medications  Medication Sig Dispense Refill  . amLODipine (NORVASC) 10 MG tablet TAKE 1 TABLET BY MOUTH EVERY DAY 30 tablet 0  . atorvastatin (LIPITOR) 20 MG tablet TAKE 1 TABLET BY MOUTH EVERY MORNING 30 tablet 0  . Blood Glucose Monitoring Suppl (GLUCOCOM BLOOD GLUCOSE MONITOR) DEVI 1 kit by Does not apply route once. 1 each 0  . cloNIDine (CATAPRES) 0.1 MG tablet TAKE ONE TABLET BY MOUTH TWICE DAILY 60 tablet 5  . clopidogrel (PLAVIX) 75 MG tablet TAKE 1 TABLET BY MOUTH DAILY WITH BREAKFAST 90 tablet 0  . cyclobenzaprine (FLEXERIL) 10 MG tablet Take 1 tablet (10 mg total) by  mouth 2 (two) times daily as needed for muscle spasms. 20 tablet 0  . glucose blood test strip To test blood sugar once per day. Dx 250.00.  Brand per insurance coverage. 100 each 12  . hydrochlorothiazide (HYDRODIURIL) 25 MG tablet TAKE 1 TABLET BY MOUTH EVERY DAY 6 tablet 0  . Lancet Device MISC To test blood sugar once per day. Dx 250.00.  Brand per insurance coverage. 100 each 6  . metFORMIN (GLUCOPHAGE) 500 MG tablet Take 1 tablet (500 mg total) by  mouth 2 (two) times daily with a meal. 180 tablet 0  . Multiple Vitamins-Minerals (MULTIVITAMIN WITH MINERALS) tablet Take 1 tablet by mouth daily.    . traMADol (ULTRAM) 50 MG tablet Take 1 tablet (50 mg total) by mouth every 6 (six) hours as needed. 16 tablet 0  . ketoconazole (NIZORAL) 2 % cream Apply 1 application topically daily. (Patient not taking: Reported on 08/14/2017) 45 g 3  . sildenafil (VIAGRA) 100 MG tablet Take 0.5-1 tablets (50-100 mg total) by mouth daily as needed for erectile dysfunction. 5 tablet 5   No current facility-administered medications for this visit.     Allergies  Allergen Reactions  . Morphine And Related Nausea And Vomiting      Review of Systems:  Constitutional:  No  fever, no chills, No recent illness,   HEENT: No  headache  Cardiac: No  chest pain, No  pressure, No palpitations, No  Orthopnea  Respiratory:  No  shortness of breath. No  Cough  Gastrointestinal: No  abdominal pain, No  nausea, No  vomiting,  No  blood in stool  Musculoskeletal: No new myalgia/arthralgia  Hem/Onc: No  easy bruising/bleeding  Endocrine: No cold intolerance,  No heat intolerance. No polyuria/polydipsia/polyphagia   Neurologic: No  weakness, No  dizziness  Psychiatric: No  concerns with depression, No  concerns with anxiety  Exam:  BP 129/85   Pulse 68   Temp 98.5 F (36.9 C) (Oral)   Resp 20   Ht 5' 11.93" (1.827 m)   Wt (!) 394 lb 9.6 oz (179 kg)   SpO2 98%   BMI 53.62 kg/m   Constitutional: VS see above. General Appearance: alert, well-developed, well-nourished, NAD  Eyes: Normal lids and conjunctive, non-icteric sclera  Ears, Nose, Mouth, Throat: MMM, Normal external inspection ears/nares/mouth/lips/gums.   Respiratory: Normal respiratory effort. no wheeze, no rhonchi, no rales  Cardiovascular: S1/S2 normal, no murmur, no rub/gallop auscultated. RRR. No lower extremity edema.   Musculoskeletal: Gait normal.  Neurological: Normal  balance/coordination. No tremor.   Skin: warm, dry, intact. No rash/ulcer.   Psychiatric: Normal judgment/insight. Normal mood and affect. Oriented x3.      ASSESSMENT/PLAN: The primary encounter diagnosis was Type 2 diabetes mellitus with diabetic dermatitis, without long-term current use of insulin ([place]). Diagnoses of Essential hypertension, PVD (peripheral vascular disease) ([place]), Mixed hyperlipidemia, Controlled type 2 diabetes mellitus without complication, without long-term current use of insulin ([place]), and Erectile dysfunction, unspecified erectile dysfunction type were also pertinent to this visit.    Maintenance medications refilled.  Patient advised to follow-up with PCP, see patient instructions   Meds ordered this encounter  Medications  . amLODipine (NORVASC) 10 MG tablet    Sig: Take 1 tablet (10 mg total) by mouth daily.    Dispense:  90 tablet    Refill:  0    OFFICE VISIT NEEDED FOR ADDITIONAL REFILLS  . atorvastatin (LIPITOR) 20 MG tablet    Sig: Take 1 tablet (20  mg total) by mouth every morning.    Dispense:  90 tablet    Refill:  0    NEEDS OFFICE VISIT FOR ADDITIONAL REFILLS  . cloNIDine (CATAPRES) 0.1 MG tablet    Sig: TAKE ONE TABLET BY MOUTH TWICE DAILY    Dispense:  180 tablet    Refill:  0  . clopidogrel (PLAVIX) 75 MG tablet    Sig: Take 1 tablet (75 mg total) by mouth daily with breakfast.    Dispense:  90 tablet    Refill:  0  . hydrochlorothiazide (HYDRODIURIL) 25 MG tablet    Sig: Take 1 tablet (25 mg total) by mouth daily.    Dispense:  90 tablet    Refill:  0  . metFORMIN (GLUCOPHAGE) 500 MG tablet    Sig: Take 1 tablet (500 mg total) by mouth 2 (two) times daily with a meal.    Dispense:  180 tablet    Refill:  0  . sildenafil (VIAGRA) 100 MG tablet    Sig: Take 0.5-1 tablets (50-100 mg total) by mouth daily as needed for erectile dysfunction.    Dispense:  30 tablet    Refill:  0  . Lancet Device MISC    Sig: To test blood sugar  once per day. Dx 250.00.  Brand per insurance coverage.    Dispense:  100 each    Refill:  6  . glucose blood test strip    Sig: To test blood sugar once per day. Dx 250.00.  Brand per insurance coverage.    Dispense:  100 each    Refill:  12   Orders Placed This Encounter  Procedures  . CMP14+EGFR  . Lipid panel  . Hemoglobin A1c  . Microalbumin / creatinine urine ratio  . CBC      Patient Instructions   Maintenance medications refilled today  For any chronic pain management, please follow-up with your primary care doctor  It is important that you schedule regular follow-ups with your primary care provider to monitor your chronic medical conditions such as diabetes, high blood pressure. Before you leave the office today, please schedule an appointment to follow-up in the next 3 months with Dr. Carlota Raspberry.        If you have lab work done today you will be contacted with your lab results within the next 2 weeks.  If you have not heard from [place] then please contact us. The fastest way to get your results is to register for My Chart.   IF you received an x-ray today, you will receive an invoice from [facility]. Please contact [facility] at [phone] with questions or concerns regarding your invoice.   IF you received labwork today, you will receive an invoice from [facility]. Please contact [facility] at [phone] with questions or concerns regarding your invoice.   Our billing staff will not be able to assist you with questions regarding bills from these companies.  You will be contacted with the lab results as soon as they are available. The fastest way to get your results is to activate your My Chart account. Instructions are located on the last page of this paperwork. If you have not heard from [place] regarding the results in 2 weeks, please contact this office.        Visit summary with medication list and pertinent instructions was printed for patient to  review. All questions at time of visit were answered - patient instructed to contact office with any additional  concerns. ER/RTC precautions were reviewed with the patient.   Follow-up plan: Return in about 3 months (around 05/20/2018) for appointment with Dr Carlota Raspberry to monitor chronic medical issues and sugars - sooner if needed.     Please note: voice recognition software was used to produce this document, and typos may escape review. Please contact Dr. Sheppard Coil for any needed clarifications.

## 2018-02-18 LAB — CMP14+EGFR
ALT: 18 IU/L (ref 0–44)
AST: 17 IU/L (ref 0–40)
Albumin/Globulin Ratio: 1.5 (ref 1.2–2.2)
Albumin: 3.7 g/dL (ref 3.5–5.5)
Alkaline Phosphatase: 76 IU/L (ref 39–117)
BUN/Creatinine Ratio: 11 (ref 9–20)
BUN: 14 mg/dL (ref 6–24)
Bilirubin Total: 0.4 mg/dL (ref 0.0–1.2)
CO2: 21 mmol/L (ref 20–29)
Calcium: 9.2 mg/dL (ref 8.7–10.2)
Chloride: 102 mmol/L (ref 96–106)
Creatinine, Ser: 1.3 mg/dL — ABNORMAL HIGH (ref 0.76–1.27)
GFR calc Af Amer: 72 mL/min/{1.73_m2} (ref >59)
GFR calc non Af Amer: 62 mL/min/{1.73_m2} (ref >59)
Globulin, Total: 2.5 g/dL (ref 1.5–4.5)
Glucose: 120 mg/dL — ABNORMAL HIGH (ref 65–99)
Potassium: 4 mmol/L (ref 3.5–5.2)
Sodium: 140 mmol/L (ref 134–144)
Total Protein: 6.2 g/dL (ref 6.0–8.5)

## 2018-02-18 LAB — CBC
Hematocrit: 45.1 % (ref 37.5–51.0)
Hemoglobin: 14.9 g/dL (ref 13.0–17.7)
MCH: 28.1 pg (ref 26.6–33.0)
MCHC: 33 g/dL (ref 31.5–35.7)
MCV: 85 fL (ref 79–97)
Platelets: 283 10*3/uL (ref 150–450)
RBC: 5.3 x10E6/uL (ref 4.14–5.80)
RDW: 14.3 % (ref 12.3–15.4)
WBC: 8.5 10*3/uL (ref 3.4–10.8)

## 2018-02-18 LAB — LIPID PANEL
Chol/HDL Ratio: 2.8 ratio (ref 0.0–5.0)
Cholesterol, Total: 147 mg/dL (ref 100–199)
HDL: 52 mg/dL (ref >39)
LDL Calculated: 77 mg/dL (ref 0–99)
Triglycerides: 90 mg/dL (ref 0–149)
VLDL Cholesterol Cal: 18 mg/dL (ref 5–40)

## 2018-02-18 LAB — HEMOGLOBIN A1C
Est. average glucose Bld gHb Est-mCnc: 143 mg/dL
Hgb A1c MFr Bld: 6.6 % — ABNORMAL HIGH (ref 4.8–5.6)

## 2018-02-18 LAB — MICROALBUMIN / CREATININE URINE RATIO
Creatinine, Urine: 168.4 mg/dL
Microalb/Creat Ratio: <1.8 mg/g creat (ref 0.0–30.0)
Microalbumin, Urine: <3 ug/mL

## 2018-02-19 ENCOUNTER — Encounter: Payer: Self-pay | Admitting: *Deleted

## 2018-04-19 ENCOUNTER — Telehealth: Payer: Self-pay | Admitting: Family Medicine

## 2018-04-19 NOTE — Telephone Encounter (Signed)
Called and spoke with pt regarding their appt scheduled with Dr. Brigitte Pulse on 04/24/18. I was able to get them rescheduled for 04/25/18 at 3:00 PM with Dr. Mitchel Honour. I advised of time, building number and late policy. Pt acknowledged

## 2018-04-20 ENCOUNTER — Ambulatory Visit: Payer: 59 | Admitting: Family Medicine

## 2018-04-25 ENCOUNTER — Encounter: Payer: Self-pay | Admitting: Emergency Medicine

## 2018-04-25 ENCOUNTER — Other Ambulatory Visit: Payer: Self-pay

## 2018-04-25 ENCOUNTER — Ambulatory Visit: Payer: Self-pay | Admitting: Emergency Medicine

## 2018-04-25 ENCOUNTER — Ambulatory Visit: Payer: 59 | Admitting: Family Medicine

## 2018-04-25 VITALS — BP 131/92 | HR 82 | Temp 98.3°F | Resp 16 | Ht 71.5 in | Wt 391.2 lb

## 2018-04-25 DIAGNOSIS — Z024 Encounter for examination for driving license: Secondary | ICD-10-CM

## 2018-04-25 NOTE — Patient Instructions (Addendum)

## 2018-04-25 NOTE — Progress Notes (Signed)
This patient presents for DOT examination for fitness for duty.   Medical History:  1. Head/Brain Injuries, disorders or illnesses no  2. Seizures, epilepsy no  3. Eye disorders or impaired vision (except corrective lenses) no  4. Ear disorders, loss of hearing or balance no  5. Heart disease or heart attack, other cardiovascular condition no  6. Heart surgery (valve replacement/bypass, angioplasty, pacemaker/defribrillator) no  7. High blood pressure yes  8. High cholesterol yes  9. Chronic cough, shortness of breath or other breathing problems no  10. Lung disease (emphysema, asthma or chronic bronchitis) no  11. Kidney disease, dialysis no  12. Digestive problems  no  13. Diabetes or elevated blood sugar yes                      If yes to #13, Insulin use no  14. Nervious or psychiatric disorders, e.g., severe depression no  15. Fainting or syncope no  16. Dizziness, headaches, numbness, tingling or memory loss no  17. Unexplained weight loss no  18. Stroke, TIA or paralysis no  19. Missing or impaired hand, arm, foot, leg, finger, toe no  20. Spinal injury or disease no  21. Bone, muscles or nerve problems no  22. Blood clots or bleeding bleeding disorders yes  23. Cancer no  24. Chronic infection or other chronic diseases no  25. Sleep disorders, pauses in breathing while asleep, daytime sleepiness, loud snoring no  26. Have you ever had a sleep test? yes  27.  Have you ever spent a night in the hospital? yes  28. Have you ever had a broken bone? no  29. Have you or or do you use tobacco products? no  30. Regular, frequent alcohol use no  31. Illegal substance use within the past 2 years no  32.  Have you ever failed a drug test or been dependent on an illegal substance? no   Current Medications: Prior to Admission medications   Medication Sig Start Date End Date Taking? Authorizing Provider  amLODipine (NORVASC) 10 MG tablet Take 1 tablet (10 mg total) by mouth  daily. 02/17/18  Yes Emeterio Reeve, DO  atorvastatin (LIPITOR) 20 MG tablet Take 1 tablet (20 mg total) by mouth every morning. 02/17/18  Yes Emeterio Reeve, DO  Blood Glucose Monitoring Suppl (GLUCOCOM BLOOD GLUCOSE MONITOR) DEVI 1 kit by Does not apply route once. 06/21/15  Yes Wendie Agreste, MD  cloNIDine (CATAPRES) 0.1 MG tablet TAKE ONE TABLET BY MOUTH TWICE DAILY 02/17/18  Yes Emeterio Reeve, DO  clopidogrel (PLAVIX) 75 MG tablet Take 1 tablet (75 mg total) by mouth daily with breakfast. 02/17/18  Yes Emeterio Reeve, DO  glucose blood test strip To test blood sugar once per day. Dx 250.00.  Brand per insurance coverage. 02/17/18  Yes Emeterio Reeve, DO  hydrochlorothiazide (HYDRODIURIL) 25 MG tablet Take 1 tablet (25 mg total) by mouth daily. 02/17/18  Yes Emeterio Reeve, DO  Lancet Device MISC To test blood sugar once per day. Dx 250.00.  Brand per insurance coverage. 02/17/18  Yes Emeterio Reeve, DO  metFORMIN (GLUCOPHAGE) 500 MG tablet Take 1 tablet (500 mg total) by mouth 2 (two) times daily with a meal. 02/17/18  Yes Emeterio Reeve, DO  Multiple Vitamins-Minerals (MULTIVITAMIN WITH MINERALS) tablet Take 1 tablet by mouth daily.   Yes [provider]  sildenafil (VIAGRA) 100 MG tablet Take 0.5-1 tablets (50-100 mg total) by mouth daily as needed for erectile  dysfunction. 02/17/18 02/16/19 Yes Emeterio Reeve, DO  cyclobenzaprine (FLEXERIL) 10 MG tablet Take 1 tablet (10 mg total) by mouth 2 (two) times daily as needed for muscle spasms. Patient not taking: Reported on 04/25/2018 08/14/17   Dorie Rank, MD  traMADol (ULTRAM) 50 MG tablet Take 1 tablet (50 mg total) by mouth every 6 (six) hours as needed. Patient not taking: Reported on 04/25/2018 08/14/17   Dorie Rank, MD    Medical Examiner's Comments on Health History:  Stable well-controlled chronic medical problems.  Not taking tramadol.   TESTING:   Visual Acuity Screening   Right eye  Left eye Both eyes  Without correction: 20/25 20/20 20/20  With correction:     Comments: Colors: 6/6 Periph: R eye -85 degrees  L eye - 85 degrees  Hearing Screening Comments: Whisper:  R ear 10 ft  L ear 10 ft  Monocular Vision: No.  Hearing Aid used for test: No. Hearing Aid required to to meet standard: No.  BP (!) 131/92 (BP Location: Right Arm)   Pulse 82   Temp 98.3 F (36.8 C) (Oral)   Resp 16   Ht 5' 11.5" (1.816 m)   Wt (!) 391 lb 3.2 oz (177.4 kg)   SpO2 94%   BMI 53.80 kg/m  Pulse rate is regular  Comments: U/A: protein-negative, blood-negative, spg-1.020, glucose-negative  PHYSICAL EXAMINATION:  General Appearance Not markedly obese. No tremor, signs of alcoholism, problem drinking or drug abuse.   Skin Warm, dry and intact.  Eyes Pupils are equal, round and reactive to light and accommodation, extraocular movements are intact. No exophthalmos, no nystagmus.  Ears Normal external ears. External canal without occlusion. No scarring of the TM. No perforation of the TM.  Mouth and Throat Clear and moist. No irremedial deformities likely to interfere with breathing or swallowing.  Heart No murmurs, extra sounds, evidence of cardiomegaly. No pacemaker. No implantable defibrillator.  Lungs and Chest (excluding breasts) Normal chest expansion, respiratory rate, breath sounds. No cyanosis.  Abdomen and Viscera No liver enlargement. No splenic enlargement. No masses, bruits, hernias or significant abdominal wall weakness.  Genitourinary  No inguinal or femoral hernia.  Spine and other musculoskeletal No tenderness, no limitation of motion, no deformities. No evidence of previous surgery.  Extremities No loss or impairment of leg, foot, toe, arm, hand, finger. No perceptible limp, deformities, atrophy, weakness, paralysis, clubbing, edema, hypotonia. Patient has sufficient grasp and prehension to maintain steering wheel grip. Patient has sufficient mobility and strength in  the lower limbs to operate pedals properly.  Neurologic Normal equilibrium, coordination, speech pattern. No paresthesia, asymmetry of deep tendon reflexes, sensory or positional abnormalities. No abnormality of patellar or Babinski's reflexes.  Gait Not antalgic or ataxic  Vascular Normal pulses. No carotid or arterial bruits. No varicose veins.     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

## 2018-07-23 ENCOUNTER — Other Ambulatory Visit: Payer: Self-pay | Admitting: Osteopathic Medicine

## 2018-07-23 DIAGNOSIS — E119 Type 2 diabetes mellitus without complications: Secondary | ICD-10-CM

## 2018-07-23 NOTE — Telephone Encounter (Signed)
Please advise 

## 2018-08-03 ENCOUNTER — Encounter: Payer: Self-pay | Admitting: Family Medicine

## 2018-08-03 ENCOUNTER — Other Ambulatory Visit: Payer: Self-pay

## 2018-08-03 ENCOUNTER — Ambulatory Visit (INDEPENDENT_AMBULATORY_CARE_PROVIDER_SITE_OTHER): Payer: BLUE CROSS/BLUE SHIELD | Admitting: Family Medicine

## 2018-08-03 VITALS — BP 148/90 | HR 91 | Temp 98.9°F | Ht 72.0 in | Wt 391.8 lb

## 2018-08-03 DIAGNOSIS — Z23 Encounter for immunization: Secondary | ICD-10-CM

## 2018-08-03 DIAGNOSIS — I10 Essential (primary) hypertension: Secondary | ICD-10-CM | POA: Diagnosis not present

## 2018-08-03 DIAGNOSIS — Z0000 Encounter for general adult medical examination without abnormal findings: Secondary | ICD-10-CM | POA: Diagnosis not present

## 2018-08-03 DIAGNOSIS — Z125 Encounter for screening for malignant neoplasm of prostate: Secondary | ICD-10-CM | POA: Diagnosis not present

## 2018-08-03 DIAGNOSIS — Z6841 Body Mass Index (BMI) 40.0 and over, adult: Secondary | ICD-10-CM

## 2018-08-03 DIAGNOSIS — E782 Mixed hyperlipidemia: Secondary | ICD-10-CM | POA: Diagnosis not present

## 2018-08-03 DIAGNOSIS — E11620 Type 2 diabetes mellitus with diabetic dermatitis: Secondary | ICD-10-CM | POA: Diagnosis not present

## 2018-08-03 MED ORDER — LISINOPRIL 5 MG PO TABS
5.0000 mg | ORAL_TABLET | Freq: Every day | ORAL | 1 refills | Status: DC
Start: 2018-08-03 — End: 2018-11-21

## 2018-08-03 MED ORDER — ZOSTER VAC RECOMB ADJUVANTED 50 MCG/0.5ML IM SUSR: 0.5000 mL | Freq: Once | INTRAMUSCULAR | 1 refills | Status: AC

## 2018-08-03 NOTE — Progress Notes (Signed)
Subjective:    Patient ID: Carlos Phillips, male    DOB: [birth year], 53 y.o.   MRN: [id number]  HPI Carlos Phillips is a 53 y.o. male Presents today for: Chief Complaint  Patient presents with  . Annual Exam    CPE...not fasting  Last visit being in February 2018, has seen some of my colleagues since that time.  Hypertension: BP Readings from Last 3 Encounters:  08/03/18 (!) 148/90  04/25/18 (!) 131/92  02/17/18 129/85   Lab Results  Component Value Date   CREATININE 1.30 (H) 02/17/2018  Has been treated with clonidine 0.1 mg twice daily, amlodipine 10 mg daily, HCTZ 25 mg daily.  Taking all 3 meds - denies missed doses.  Alcohol - 3-6 liquor drinks on Saturdays only. Only supplement - MVI.  Creatinine nl in March 2019 - 1.18.  More fast food since quarantine.   Hyperlipidemia:  Lab Results  Component Value Date   CHOL 147 02/17/2018   HDL 52 02/17/2018   LDLCALC 77 02/17/2018   TRIG 90 02/17/2018   CHOLHDL 2.8 02/17/2018   Lab Results  Component Value Date   ALT 18 02/17/2018   AST 17 02/17/2018   ALKPHOS 76 02/17/2018   BILITOT 0.4 02/17/2018  lipitor 63m qd - no new side effects.  No new myalgias.   Obesity: Wt Readings from Last 3 Encounters:  08/03/18 (!) 391 lb 12.8 oz (177.7 kg)  04/25/18 (!) 391 lb 3.2 oz (177.4 kg)  02/17/18 (!) 394 lb 9.6 oz (179 kg)   Body mass index is 53.14 kg/m. Fast food as above.  Rare soda. No sweet tea.  No regular exercise.  Wants to try to watch diet and increase exercise for now.   PVD with history of extremity ischemia on left.  Has taken Plavix, amlodipine, statin with Lipitor.  Diabetes: Off medications in 2017 due to diarrhea. Treated with diet control, then restarted on metformin, currently taking 500 mg twice daily.  Due for pneumonia vaccination, due for ophtho-exam. Lab Results  Component Value Date   HGBA1C 6.6 (H) 02/17/2018  Normal microalbumin creatinine ratio last November. Not currently  on ACE inhibitor, does take statin.  Cancer screening: Colonoscopy in September 2017 No recent PSA.  Declines DRE, but would like PSA.   Immunization History  Administered Date(s) Administered  . Tdap 08/05/2013  SHingles vaccine: has not had.    [facility] 08/03/2018 04/25/2018 02/17/2018 06/17/2017 12/31/2016  Decreased Interest 0 0 0 0 0  Down, Depressed, Hopeless 0 0 0 0 0  PHQ - 2 Score 0 0 0 0 0    Visual Acuity Screening   Right eye Left eye Both eyes  Without correction: '20/15 20/15 20/15 '  With correction:     overdue for screening.   Dental: no recent eval, plans to schedule.   Exercise: No regular exercise.   ED:  has taken sildenafil in past.  No vision or hearing change. No HA or flushing feeling, no CP with exertion.   Patient Active Problem List   Diagnosis Date Noted  . Hyperlipidemia 12/31/2016  . PVD (peripheral vascular disease) ([place] 01/29/2016  . Diverticulitis 01/28/2016  . Special screening for malignant neoplasms, colon   . Type 2 diabetes mellitus with diabetic dermatitis, without long-term current use of insulin ([place] 05/03/2015  . History of deep vein thrombosis (DVT) of lower extremity 03/07/2013  . Ischemia of extremity 02/25/2013  . HTN (hypertension) 05/07/2011  . Erectile dysfunction 05/07/2011  .  Obesity 05/07/2011   Past Medical History:  Diagnosis Date  . Diabetes mellitus without complication ([place])   . DVT (deep venous thrombosis) ([place]) 02/2013  . Erectile dysfunction   . Hyperlipidemia   . Hypertension   . Obesity    BMI >52  . PVD (peripheral vascular disease) ([place])    Past Surgical History:  Procedure Laterality Date  . ABDOMINAL AORTAGRAM N/A 02/28/2013   Procedure: ABDOMINAL Maxcine Ham;  Surgeon: Conrad [place], MD;  Location: [facility] CATH LAB;  Service: Cardiovascular;  Laterality: N/A;  . COLONOSCOPY WITH PROPOFOL N/A 12/25/2015   Procedure: COLONOSCOPY WITH PROPOFOL;  Surgeon: Manus Gunning, MD;   Location: [facility] ENDOSCOPY;  Service: Gastroenterology;  Laterality: N/A;  . EMBOLECTOMY Left 02/25/2013   Procedure: Left Popliteal EMBOLECTOMY Poss fasciotomy;  Surgeon: Elam Dutch, MD;  Location: [facility] OR;  Service: Vascular;  Laterality: Left;  Left poplital and Tibial embolectomy with four compartment Fasciotomy with vein patch angioplasty left popliteal artery.   Allergies  Allergen Reactions  . Morphine And Related Nausea And Vomiting   Prior to Admission medications   Medication Sig Start Date End Date Taking? Authorizing Provider  amLODipine (NORVASC) 10 MG tablet Take 1 tablet (10 mg total) by mouth daily. 02/17/18  Yes Emeterio Reeve, DO  atorvastatin (LIPITOR) 20 MG tablet Take 1 tablet (20 mg total) by mouth every morning. 02/17/18  Yes Emeterio Reeve, DO  Blood Glucose Monitoring Suppl (GLUCOCOM BLOOD GLUCOSE MONITOR) DEVI 1 kit by Does not apply route once. 06/21/15  Yes Wendie Agreste, MD  cloNIDine (CATAPRES) 0.1 MG tablet TAKE ONE TABLET BY MOUTH TWICE DAILY 02/17/18  Yes Emeterio Reeve, DO  clopidogrel (PLAVIX) 75 MG tablet Take 1 tablet (75 mg total) by mouth daily with breakfast. 02/17/18  Yes Emeterio Reeve, DO  glucose blood test strip To test blood sugar once per day. Dx 250.00.  Brand per insurance coverage. 02/17/18  Yes Emeterio Reeve, DO  hydrochlorothiazide (HYDRODIURIL) 25 MG tablet Take 1 tablet (25 mg total) by mouth daily. 02/17/18  Yes Emeterio Reeve, DO  Lancet Device MISC To test blood sugar once per day. Dx 250.00.  Brand per insurance coverage. 02/17/18  Yes Emeterio Reeve, DO  metFORMIN (GLUCOPHAGE) 500 MG tablet Take 1 tablet (500 mg total) by mouth 2 (two) times daily with a meal. 02/17/18  Yes Emeterio Reeve, DO  Multiple Vitamins-Minerals (MULTIVITAMIN WITH MINERALS) tablet Take 1 tablet by mouth daily.   Yes [provider]  sildenafil (VIAGRA) 100 MG tablet Take 0.5-1 tablets (50-100 mg total) by mouth daily as  needed for erectile dysfunction. 02/17/18 02/16/19 Yes Emeterio Reeve, DO   Social History   Socioeconomic History  . Marital status: Married    Spouse name: Ivin Booty  . Number of children: 2  . Years of education: Not on file  . Highest education level: Not on file  Occupational History  . 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 Financial resource strain: Not on file  . Food insecurity:    Worry: Not on file    Inability: Not on file  . Transportation needs:    Medical: Not on file    Non-medical: Not on file  Tobacco Use  . Smoking status: Former Smoker    Packs/day: 1.00    Years: 30.00    Pack years: 30.00    Types: Cigarettes    Last attempt to quit: 02/25/2013    Years since quitting: 5.4  . Smokeless tobacco: Never Used  Substance and Sexual Activity  . Alcohol use: Yes    Alcohol/week: 4.0 standard drinks    Types: 4 Standard drinks or equivalent per week    Comment: social- rare  . Drug use: No  . Sexual activity: Yes    Partners: Female  Lifestyle  . Physical activity:    Days per week: Not on file    Minutes per session: Not on file  . Stress: Not on file  Relationships  . Social connections:    Talks on phone: Not on file    Gets together: Not on file    Attends religious service: Not on file    Active member of club or organization: Not on file    Attends meetings of clubs or organizations: Not on file    Relationship status: Not on file  . Intimate partner violence:    Fear of current or ex partner: Not on file    Emotionally abused: Not on file    Physically abused: Not on file    Forced sexual activity: Not on file  Other Topics Concern  . Not on file  Social History Narrative  . Not on file    Review of Systems  Constitutional: Negative for fatigue and unexpected weight change.  Eyes: Negative for visual disturbance.  Respiratory: Negative for cough, chest tightness and shortness of breath.   Cardiovascular: Negative for chest pain,  palpitations and leg swelling.  Gastrointestinal: Negative for abdominal pain and blood in stool.  Neurological: Negative for dizziness, light-headedness and headaches.       Objective:   Physical Exam Vitals signs reviewed.  Constitutional:      Appearance: He is well-developed.  HENT:     Head: Normocephalic and atraumatic.     Right Ear: External ear normal.     Left Ear: External ear normal.  Eyes:     Conjunctiva/sclera: Conjunctivae normal.     Pupils: Pupils are equal, round, and reactive to light.  Neck:     Musculoskeletal: Normal range of motion and neck supple.     Thyroid: No thyromegaly.  Cardiovascular:     Rate and Rhythm: Normal rate and regular rhythm.     Heart sounds: Normal heart sounds.  Pulmonary:     Effort: Pulmonary effort is normal. No respiratory distress.     Breath sounds: Normal breath sounds. No wheezing.  Abdominal:     General: There is no distension.     Palpations: Abdomen is soft.     Tenderness: There is no abdominal tenderness.  Musculoskeletal: Normal range of motion.        General: No tenderness.  Lymphadenopathy:     Cervical: No cervical adenopathy.  Skin:    General: Skin is warm and dry.  Neurological:     Mental Status: He is alert and oriented to person, place, and time.     Deep Tendon Reflexes: Reflexes are normal and symmetric.  Psychiatric:        Behavior: Behavior normal.        Assessment & Plan:   Carlos Phillips is a 53 y.o. male Type 2 diabetes mellitus with diabetic dermatitis, without long-term current use of insulin ([place]) - Plan: Hemoglobin A1c, Ambulatory referral to Ophthalmology, CANCELED: Microalbumin, urine  -Weight loss/exercise discussed for obesity as well as options including medical bariatrics or surgical bariatrics.  Would like to start with weight loss and diet approaches.  -Check A1c, continue metformin same dose for right now  -Add  low-dose ACE inhibitor which is also for blood pressure.   Ophthalmology referral placed.  Annual physical exam - Plan: Hemoglobin A1c, CMP14+EGFR, Lipid panel, CANCELED: Microalbumin, urine  - -anticipatory guidance as below in AVS, screening labs above. Health maintenance items as above in HPI discussed/recommended as applicable.   Mixed hyperlipidemia - Plan: CMP14+EGFR, Lipid panel  -Tolerating Lipitor, continue same, labs pending  Essential hypertension - Plan: lisinopril (ZESTRIL) 5 MG tablet  -Improved on recheck, but still elevated.  Add 5 mg lisinopril, watch for side effects including hypotension.  Recheck next few weeks.  Need for shingles vaccine - Plan: Zoster Vaccine Adjuvanted [facility]) injection  - sent to pharmacy.   Class 3 severe obesity with serious comorbidity and body mass index (BMI) of 50.0 to 59.9 in adult, unspecified obesity type ([place])  - as above.   Screening for prostate cancer - Plan: PSA  - We discussed pros and cons of prostate cancer screening, and after this discussion, he chose to have screening done with PSA only. Limitations discussed.    Meds ordered this encounter  Medications  . lisinopril (ZESTRIL) 5 MG tablet    Sig: Take 1 tablet (5 mg total) by mouth daily.    Dispense:  90 tablet    Refill:  1  . Zoster Vaccine Adjuvanted [facility]) injection    Sig: Inject 0.5 mLs into the muscle once for 1 dose. Repeat in 2-6 months.    Dispense:  0.5 mL    Refill:  1   Patient Instructions    Cut back on fast food and other foods that are high in sodium. Start lisinopril once per day and recheck blood pressure in next 2 weeks. Keep a record of your blood pressures outside of the office if possible for next visit.   Walking for exercise to start.   No other changes for now.   Return to the clinic or go to the nearest emergency room if any of your symptoms worsen or new symptoms occur.  Keeping you healthy  Get these tests  Blood pressure- Have your blood pressure checked once a year by your  healthcare provider.  Normal blood pressure is 120/80  Weight- Have your body mass index (BMI) calculated to screen for obesity.  BMI is a measure of body fat based on height and weight. You can also calculate your own BMI at [email or web address].  Cholesterol- Have your cholesterol checked every year.  Diabetes- Have your blood sugar checked regularly if you have high blood pressure, high cholesterol, have a family history of diabetes or if you are overweight.  Screening for Colon Cancer- Colonoscopy starting at age 45.  Screening may begin sooner depending on your family history and other health conditions. Follow up colonoscopy as directed by your Gastroenterologist.  Screening for Prostate Cancer- Both blood work (PSA) and a rectal exam help screen for Prostate Cancer.  Screening begins at age 53 with African-American men and at age 64 with Caucasian men.  Screening may begin sooner depending on your family history.  Take these medicines  Aspirin- One aspirin daily can help prevent Heart disease and Stroke.  Flu shot- Every fall.  Tetanus- Every 10 years.  Zostavax- Once after the age of 72 to prevent Shingles.  Pneumonia shot- Once after the age of 74; if you are younger than 26, ask your healthcare provider if you need a Pneumonia shot.  Take these steps  Don't smoke- If you do smoke, talk to your doctor about quitting.  For tips on how to quit, go to www.smokefree.gov or call 1-800-QUIT-NOW.  Be physically active- Exercise 5 days a week for at least 30 minutes.  If you are not already physically active start slow and gradually work up to 30 minutes of moderate physical activity.  Examples of moderate activity include walking briskly, mowing the yard, dancing, swimming, bicycling, etc.  Eat a healthy diet- Eat a variety of healthy food such as fruits, vegetables, low fat milk, low fat cheese, yogurt, lean meant, poultry, fish, beans, tofu, etc. For more information go to  www.thenutritionsource.org  Drink alcohol in moderation- Limit alcohol intake to less than two drinks a day. Never drink and drive.  Dentist- Brush and floss twice daily; visit your dentist twice a year.  Depression- Your emotional health is as important as your physical health. If you're feeling down, or losing interest in things you would normally enjoy please talk to your healthcare provider.  Eye exam- Visit your eye doctor every year.  Safe sex- If you may be exposed to a sexually transmitted infection, use a condom.  Seat belts- Seat belts can save your life; always wear one.  Smoke/Carbon Monoxide detectors- These detectors need to be installed on the appropriate level of your home.  Replace batteries at least once a year.  Skin cancer- When out in the sun, cover up and use sunscreen 15 SPF or higher.  Violence- If anyone is threatening you, please tell your healthcare provider.  Living Will/ Health care power of attorney- Speak with your healthcare provider and family.   If you have lab work done today you will be contacted with your lab results within the next 2 weeks.  If you have not heard from [place] then please contact us. The fastest way to get your results is to register for My Chart.   IF you received an x-ray today, you will receive an invoice from [facility]. Please contact [facility] at [phone] with questions or concerns regarding your invoice.   IF you received labwork today, you will receive an invoice from [place]. Please contact [facility] at [phone] with questions or concerns regarding your invoice.   Our billing staff will not be able to assist you with questions regarding bills from these companies.  You will be contacted with the lab results as soon as they are available. The fastest way to get your results is to activate your My Chart account. Instructions are located on the last page of this paperwork. If you have not heard from [place]  regarding the results in 2 weeks, please contact this office.       Signed,   [name], MD [facility] [place].  08/04/18 2:36 PM

## 2018-08-03 NOTE — Patient Instructions (Addendum)
Cut back on fast food and other foods that are high in sodium. Start lisinopril once per day and recheck blood pressure in next 2 weeks. Keep a record of your blood pressures outside of the office if possible for next visit.   Walking for exercise to start.   No other changes for now.   Return to the clinic or go to the nearest emergency room if any of your symptoms worsen or new symptoms occur.  Keeping you healthy  Get these tests  Blood pressure- Have your blood pressure checked once a year by your healthcare provider.  Normal blood pressure is 120/80  Weight- Have your body mass index (BMI) calculated to screen for obesity.  BMI is a measure of body fat based on height and weight. You can also calculate your own BMI at [employer]Banking[employer][email or web address].  Cholesterol- Have your cholesterol checked every year.  Diabetes- Have your blood sugar checked regularly if you have high blood pressure, high cholesterol, have a family history of diabetes or if you are overweight.  Screening for Colon Cancer- Colonoscopy starting at age 67.  Screening may begin sooner depending on your family history and other health conditions. Follow up colonoscopy as directed by your Gastroenterologist.  Screening for Prostate Cancer- Both blood work (PSA) and a rectal exam help screen for Prostate Cancer.  Screening begins at age 80 with African-American men and at age 72 with Caucasian men.  Screening may begin sooner depending on your family history.  Take these medicines  Aspirin- One aspirin daily can help prevent Heart disease and Stroke.  Flu shot- Every fall.  Tetanus- Every 10 years.  Zostavax- Once after the age of 71 to prevent Shingles.  Pneumonia shot- Once after the age of 73; if you are younger than 55, ask your healthcare provider if you need a Pneumonia shot.  Take these steps  Don't smoke- If you do smoke, talk to your doctor about quitting.  For tips on how to quit, go to www.smokefree.gov  or call 1-800-QUIT-NOW.  Be physically active- Exercise 5 days a week for at least 30 minutes.  If you are not already physically active start slow and gradually work up to 30 minutes of moderate physical activity.  Examples of moderate activity include walking briskly, mowing the yard, dancing, swimming, bicycling, etc.  Eat a healthy diet- Eat a variety of healthy food such as fruits, vegetables, low fat milk, low fat cheese, yogurt, lean meant, poultry, fish, beans, tofu, etc. For more information go to www.thenutritionsource.org  Drink alcohol in moderation- Limit alcohol intake to less than two drinks a day. Never drink and drive.  Dentist- Brush and floss twice daily; visit your dentist twice a year.  Depression- Your emotional health is as important as your physical health. If you're feeling down, or losing interest in things you would normally enjoy please talk to your healthcare provider.  Eye exam- Visit your eye doctor every year.  Safe sex- If you may be exposed to a sexually transmitted infection, use a condom.  Seat belts- Seat belts can save your life; always wear one.  Smoke/Carbon Monoxide detectors- These detectors need to be installed on the appropriate level of your home.  Replace batteries at least once a year.  Skin cancer- When out in the sun, cover up and use sunscreen 15 SPF or higher.  Violence- If anyone is threatening you, please tell your healthcare provider.  Living Will/ Health care power of attorney- Speak with your healthcare provider and  family.   If you have lab work done today you will be contacted with your lab results within the next 2 weeks.  If you have not heard from [place] then please contact us. The fastest way to get your results is to register for My Chart.   IF you received an x-ray today, you will receive an invoice from [facility]. Please contact [facility] at [phone] with questions or concerns regarding your invoice.    IF you received labwork today, you will receive an invoice from [place]. Please contact [facility] at [phone] with questions or concerns regarding your invoice.   Our billing staff will not be able to assist you with questions regarding bills from these companies.  You will be contacted with the lab results as soon as they are available. The fastest way to get your results is to activate your My Chart account. Instructions are located on the last page of this paperwork. If you have not heard from [place] regarding the results in 2 weeks, please contact this office.

## 2018-08-04 ENCOUNTER — Encounter: Payer: Self-pay | Admitting: Family Medicine

## 2018-08-04 LAB — CMP14+EGFR
ALT: 35 IU/L (ref 0–44)
AST: 20 IU/L (ref 0–40)
Albumin/Globulin Ratio: 1.4 (ref 1.2–2.2)
Albumin: 3.8 g/dL (ref 3.8–4.9)
Alkaline Phosphatase: 95 IU/L (ref 39–117)
BUN/Creatinine Ratio: 13 (ref 9–20)
BUN: 16 mg/dL (ref 6–24)
Bilirubin Total: 0.3 mg/dL (ref 0.0–1.2)
CO2: 21 mmol/L (ref 20–29)
Calcium: 9.9 mg/dL (ref 8.7–10.2)
Chloride: 104 mmol/L (ref 96–106)
Creatinine, Ser: 1.21 mg/dL (ref 0.76–1.27)
GFR calc Af Amer: 79 mL/min/{1.73_m2} (ref >59)
GFR calc non Af Amer: 68 mL/min/{1.73_m2} (ref >59)
Globulin, Total: 2.8 g/dL (ref 1.5–4.5)
Glucose: 96 mg/dL (ref 65–99)
Potassium: 4.4 mmol/L (ref 3.5–5.2)
Sodium: 141 mmol/L (ref 134–144)
Total Protein: 6.6 g/dL (ref 6.0–8.5)

## 2018-08-04 LAB — LIPID PANEL
Chol/HDL Ratio: 4.8 ratio (ref 0.0–5.0)
Cholesterol, Total: 208 mg/dL — ABNORMAL HIGH (ref 100–199)
HDL: 43 mg/dL (ref >39)
LDL Calculated: 117 mg/dL — ABNORMAL HIGH (ref 0–99)
Triglycerides: 240 mg/dL — ABNORMAL HIGH (ref 0–149)
VLDL Cholesterol Cal: 48 mg/dL — ABNORMAL HIGH (ref 5–40)

## 2018-08-04 LAB — HEMOGLOBIN A1C
Est. average glucose Bld gHb Est-mCnc: 146 mg/dL
Hgb A1c MFr Bld: 6.7 % — ABNORMAL HIGH (ref 4.8–5.6)

## 2018-08-04 LAB — MICROALBUMIN, URINE: Microalbumin, Urine: 3.1 ug/mL

## 2018-08-05 LAB — PSA: Prostate Specific Ag, Serum: 0.3 ng/mL (ref 0.0–4.0)

## 2018-08-17 ENCOUNTER — Other Ambulatory Visit: Payer: Self-pay

## 2018-08-17 ENCOUNTER — Ambulatory Visit: Payer: BLUE CROSS/BLUE SHIELD | Admitting: Family Medicine

## 2018-08-17 ENCOUNTER — Encounter: Payer: Self-pay | Admitting: Family Medicine

## 2018-08-17 VITALS — BP 114/78 | HR 78 | Temp 98.8°F | Resp 20 | Ht 71.81 in | Wt 392.2 lb

## 2018-08-17 DIAGNOSIS — I10 Essential (primary) hypertension: Secondary | ICD-10-CM

## 2018-08-17 DIAGNOSIS — E785 Hyperlipidemia, unspecified: Secondary | ICD-10-CM | POA: Diagnosis not present

## 2018-08-17 NOTE — Progress Notes (Signed)
Subjective:    Patient ID: Carlos Phillips, male    DOB: 1965-12-29, 53 y.o.   MRN: [id number]  Carlos Phillips is a 53 y.o. male Presents today for: Chief Complaint  Patient presents with  . Hypertension    2 week f/u  Office visit May 8.  Elevated blood pressure at that time.Added lisinopril 5 mg daily.  Of note urine microalbumin was normal May 8.  A1c was also stable at that time.  No new side effects on new regimen.    BP Readings from Last 3 Encounters:  08/17/18 114/78  08/03/18 (!) 148/90  04/25/18 (!) 131/92   Lab Results  Component Value Date   CREATININE 1.21 08/03/2018   Hyperlipidemia:  Lab Results  Component Value Date   CHOL 208 (H) 08/03/2018   HDL 43 08/03/2018   LDLCALC 117 (H) 08/03/2018   TRIG 240 (H) 08/03/2018   CHOLHDL 4.8 08/03/2018   Lab Results  Component Value Date   ALT 35 08/03/2018   AST 20 08/03/2018   ALKPHOS 95 08/03/2018   BILITOT 0.3 08/03/2018   Plan for diet/exercise. Would like to remain on same dose of lipitor with repeat test in next 3-6 months.     Patient Active Problem List   Diagnosis Date Noted  . Hyperlipidemia 12/31/2016  . PVD (peripheral vascular disease) ([place]) 01/29/2016  . Diverticulitis 01/28/2016  . Special screening for malignant neoplasms, colon   . Type 2 diabetes mellitus with diabetic dermatitis, without long-term current use of insulin ([place]) 05/03/2015  . History of deep vein thrombosis (DVT) of lower extremity 03/07/2013  . Ischemia of extremity 02/25/2013  . HTN (hypertension) 05/07/2011  . Erectile dysfunction 05/07/2011  . Obesity 05/07/2011   Past Medical History:  Diagnosis Date  . Diabetes mellitus without complication ([place])   . DVT (deep venous thrombosis) ([place]) 02/2013  . Erectile dysfunction   . Hyperlipidemia   . Hypertension   . Obesity    BMI >52  . PVD (peripheral vascular disease) ([place])    Past Surgical History:  Procedure Laterality Date  . ABDOMINAL AORTAGRAM  N/A 02/28/2013   Procedure: ABDOMINAL Maxcine Ham;  Surgeon: Conrad Forest, MD;  Location: [facility] CATH LAB;  Service: Cardiovascular;  Laterality: N/A;  . COLONOSCOPY WITH PROPOFOL N/A 12/25/2015   Procedure: COLONOSCOPY WITH PROPOFOL;  Surgeon: Manus Gunning, MD;  Location: [facility] ENDOSCOPY;  Service: Gastroenterology;  Laterality: N/A;  . EMBOLECTOMY Left 02/25/2013   Procedure: Left Popliteal EMBOLECTOMY Poss fasciotomy;  Surgeon: Elam Dutch, MD;  Location: [facility] OR;  Service: Vascular;  Laterality: Left;  Left poplital and Tibial embolectomy with four compartment Fasciotomy with vein patch angioplasty left popliteal artery.   Allergies  Allergen Reactions  . Morphine And Related Nausea And Vomiting   Prior to Admission medications   Medication Sig Start Date End Date Taking? Authorizing Provider  amLODipine (NORVASC) 10 MG tablet Take 1 tablet (10 mg total) by mouth daily. 02/17/18  Yes Emeterio Reeve, DO  atorvastatin (LIPITOR) 20 MG tablet Take 1 tablet (20 mg total) by mouth every morning. 02/17/18  Yes Emeterio Reeve, DO  Blood Glucose Monitoring Suppl (GLUCOCOM BLOOD GLUCOSE MONITOR) DEVI 1 kit by Does not apply route once. 06/21/15  Yes Wendie Agreste, MD  cloNIDine (CATAPRES) 0.1 MG tablet TAKE ONE TABLET BY MOUTH TWICE DAILY 02/17/18  Yes Emeterio Reeve, DO  clopidogrel (PLAVIX) 75 MG tablet Take 1 tablet (75 mg total) by mouth daily with breakfast. 02/17/18  Yes Emeterio Reeve, DO  glucose blood test strip To test blood sugar once per day. Dx 250.00.  Brand per insurance coverage. 02/17/18  Yes Emeterio Reeve, DO  hydrochlorothiazide (HYDRODIURIL) 25 MG tablet Take 1 tablet (25 mg total) by mouth daily. 02/17/18  Yes Emeterio Reeve, DO  Lancet Device MISC To test blood sugar once per day. Dx 250.00.  Brand per insurance coverage. 02/17/18  Yes Emeterio Reeve, DO  lisinopril (ZESTRIL) 5 MG tablet Take 1 tablet (5 mg total) by mouth daily. 08/03/18  Yes  Wendie Agreste, MD  metFORMIN (GLUCOPHAGE) 500 MG tablet Take 1 tablet (500 mg total) by mouth 2 (two) times daily with a meal. 02/17/18  Yes Emeterio Reeve, DO  Multiple Vitamins-Minerals (MULTIVITAMIN WITH MINERALS) tablet Take 1 tablet by mouth daily.   Yes [provider]  sildenafil (VIAGRA) 100 MG tablet Take 0.5-1 tablets (50-100 mg total) by mouth daily as needed for erectile dysfunction. 02/17/18 02/16/19 Yes Emeterio Reeve, DO   Social History   Socioeconomic History  . Marital status: Married    Spouse name: Ivin Booty  . Number of children: 2  . Years of education: Not on file  . Highest education level: Not on file  Occupational History  . 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 Financial resource strain: Not on file  . Food insecurity:    Worry: Not on file    Inability: Not on file  . Transportation needs:    Medical: Not on file    Non-medical: Not on file  Tobacco Use  . Smoking status: Former Smoker    Packs/day: 1.00    Years: 30.00    Pack years: 30.00    Types: Cigarettes    Last attempt to quit: 02/25/2013    Years since quitting: 5.4  . Smokeless tobacco: Never Used  Substance and Sexual Activity  . Alcohol use: Yes    Alcohol/week: 4.0 standard drinks    Types: 4 Standard drinks or equivalent per week    Comment: social- rare  . Drug use: No  . Sexual activity: Yes    Partners: Female  Lifestyle  . Physical activity:    Days per week: Not on file    Minutes per session: Not on file  . Stress: Not on file  Relationships  . Social connections:    Talks on phone: Not on file    Gets together: Not on file    Attends religious service: Not on file    Active member of club or organization: Not on file    Attends meetings of clubs or organizations: Not on file    Relationship status: Not on file  . Intimate partner violence:    Fear of current or ex partner: Not on file    Emotionally abused: Not on file    Physically abused:  Not on file    Forced sexual activity: Not on file  Other Topics Concern  . Not on file  Social History Narrative  . Not on file    Review of Systems  Constitutional: Negative for fatigue and unexpected weight change.  Eyes: Negative for visual disturbance.  Respiratory: Negative for cough, chest tightness and shortness of breath.   Cardiovascular: Negative for chest pain, palpitations and leg swelling.  Gastrointestinal: Negative for abdominal pain and blood in stool.  Neurological: Negative for dizziness, light-headedness and headaches.       Objective:   Physical Exam Vitals signs reviewed.  Constitutional:  Appearance: He is well-developed.  HENT:     Head: Normocephalic and atraumatic.  Eyes:     Pupils: Pupils are equal, round, and reactive to light.  Neck:     Vascular: No carotid bruit or JVD.  Cardiovascular:     Rate and Rhythm: Normal rate and regular rhythm.     Heart sounds: Normal heart sounds. No murmur.  Pulmonary:     Effort: Pulmonary effort is normal.     Breath sounds: Normal breath sounds. No rales.  Skin:    General: Skin is warm and dry.  Neurological:     Mental Status: He is alert and oriented to person, place, and time.    Vitals:   08/17/18 1442  BP: 114/78  Pulse: 78  Resp: 20  Temp: 98.8 F (37.1 C)  TempSrc: Oral  SpO2: 98%  Weight: (!) 392 lb 3.2 oz (177.9 kg)  Height: 5' 11.81" (1.824 m)      Assessment & Plan:  CYLAN BORUM is a 53 y.o. male Essential hypertension  -Improved control, continue same regimen.  Plan on recheck labs in the next 3 months  Hyperlipidemia, unspecified hyperlipidemia type  -Goal LDL significantly lower, but would like to try diet/exercise approach for now and continue simvastatin.  Recheck 3 to 6 months.   No orders of the defined types were placed in this encounter.  Patient Instructions       If you have lab work done today you will be contacted with your lab results within the  next 2 weeks.  If you have not heard from [place] then please contact us. The fastest way to get your results is to register for My Chart.   IF you received an x-ray today, you will receive an invoice from [facility] Rad[facility]. Please contact [facility] at [phone] with questions or concerns regarding your invoice.   IF you received labwork today, you will receive an invoice from [place]. Please contact [facility] at [phone] with questions or concerns regarding your invoice.   Our billing staff will not be able to assist you with questions regarding bills from these companies.  You will be contacted with the lab results as soon as they are available. The fastest way to get your results is to activate your My Chart account. Instructions are located on the last page of this paperwork. If you have not heard from [place] regarding the results in 2 weeks, please contact this office.      Signed,   [name], MD [facility].  08/17/18 3:25 PM

## 2018-08-17 NOTE — Patient Instructions (Signed)
° ° ° °  If you have lab work done today you will be contacted with your lab results within the next 2 weeks.  If you have not heard from us then please contact us. The fastest way to get your results is to register for My Chart. ° ° °IF you received an x-ray today, you will receive an invoice from [facility]. Please contact [facility] at [phone] with questions or concerns regarding your invoice.  ° °IF you received labwork today, you will receive an invoice from [facility]. Please contact [facility] at [phone] with questions or concerns regarding your invoice.  ° °Our billing staff will not be able to assist you with questions regarding bills from these companies. ° °You will be contacted with the lab results as soon as they are available. The fastest way to get your results is to activate your My Chart account. Instructions are located on the last page of this paperwork. If you have not heard from us regarding the results in 2 weeks, please contact this office. °  ° ° ° °

## 2018-09-11 ENCOUNTER — Other Ambulatory Visit: Payer: Self-pay | Admitting: Osteopathic Medicine

## 2018-09-11 DIAGNOSIS — I10 Essential (primary) hypertension: Secondary | ICD-10-CM

## 2018-09-11 DIAGNOSIS — I739 Peripheral vascular disease, unspecified: Secondary | ICD-10-CM

## 2018-09-11 NOTE — Telephone Encounter (Signed)
Please advise 

## 2018-09-24 ENCOUNTER — Other Ambulatory Visit: Payer: Self-pay | Admitting: Family Medicine

## 2018-09-24 ENCOUNTER — Other Ambulatory Visit: Payer: Self-pay | Admitting: Osteopathic Medicine

## 2018-09-24 DIAGNOSIS — E782 Mixed hyperlipidemia: Secondary | ICD-10-CM

## 2018-09-24 DIAGNOSIS — I739 Peripheral vascular disease, unspecified: Secondary | ICD-10-CM

## 2018-09-24 DIAGNOSIS — I10 Essential (primary) hypertension: Secondary | ICD-10-CM

## 2018-09-24 NOTE — Telephone Encounter (Signed)
Would Dr. Carlota Raspberry like to take over these rxs

## 2018-09-24 NOTE — Telephone Encounter (Signed)
Would Dr. Carlota Raspberry like to take over this rx

## 2018-10-01 ENCOUNTER — Telehealth: Payer: Self-pay | Admitting: Family Medicine

## 2018-10-01 ENCOUNTER — Other Ambulatory Visit: Payer: Self-pay | Admitting: Osteopathic Medicine

## 2018-10-01 DIAGNOSIS — I10 Essential (primary) hypertension: Secondary | ICD-10-CM

## 2018-10-01 NOTE — Telephone Encounter (Signed)
Medication Refill - Medication: clopidogrel (PLAVIX) 75 MG tablet hydrochlorothiazide (HYDRODIURIL) 25 MG tablet  Has the patient contacted their pharmacy? yes (Agent: If no, request that the patient contact the pharmacy for the refill.) (Agent: If yes, when and what did the pharmacy advise?)  Preferred Pharmacy (with phone number or street name):  [facility] [place], [place] [phone] (Phone) [phone] (Fax)    Agent: Please be advised that RX refills may take up to 3 business days. We ask that you follow-up with your pharmacy.

## 2018-10-05 ENCOUNTER — Other Ambulatory Visit: Payer: Self-pay

## 2018-10-05 DIAGNOSIS — I10 Essential (primary) hypertension: Secondary | ICD-10-CM

## 2018-10-05 MED ORDER — HYDROCHLOROTHIAZIDE 25 MG PO TABS: 25.0000 mg | ORAL_TABLET | Freq: Every day | ORAL | 0 refills | Status: DC

## 2018-10-05 NOTE — Telephone Encounter (Signed)
Rx was sent to pharmacy. 

## 2018-11-21 ENCOUNTER — Encounter: Payer: Self-pay | Admitting: Family Medicine

## 2018-11-21 ENCOUNTER — Ambulatory Visit: Payer: BLUE CROSS/BLUE SHIELD | Admitting: Family Medicine

## 2018-11-21 ENCOUNTER — Other Ambulatory Visit: Payer: Self-pay

## 2018-11-21 VITALS — BP 135/80 | HR 93 | Temp 98.3°F | Resp 16 | Wt 390.6 lb

## 2018-11-21 DIAGNOSIS — I10 Essential (primary) hypertension: Secondary | ICD-10-CM

## 2018-11-21 DIAGNOSIS — E1159 Type 2 diabetes mellitus with other circulatory complications: Secondary | ICD-10-CM

## 2018-11-21 DIAGNOSIS — Z6841 Body Mass Index (BMI) 40.0 and over, adult: Secondary | ICD-10-CM

## 2018-11-21 DIAGNOSIS — E782 Mixed hyperlipidemia: Secondary | ICD-10-CM | POA: Diagnosis not present

## 2018-11-21 DIAGNOSIS — I739 Peripheral vascular disease, unspecified: Secondary | ICD-10-CM | POA: Diagnosis not present

## 2018-11-21 MED ORDER — ATORVASTATIN CALCIUM 20 MG PO TABS: ORAL_TABLET | ORAL | 1 refills | Status: DC

## 2018-11-21 MED ORDER — HYDROCHLOROTHIAZIDE 25 MG PO TABS
25.0000 mg | ORAL_TABLET | Freq: Every day | ORAL | 1 refills | Status: DC
Start: 2018-11-21 — End: 2020-01-30

## 2018-11-21 MED ORDER — AMLODIPINE BESYLATE 10 MG PO TABS: ORAL_TABLET | ORAL | 1 refills | Status: DC

## 2018-11-21 MED ORDER — CLOPIDOGREL BISULFATE 75 MG PO TABS: 75.0000 mg | ORAL_TABLET | Freq: Every day | ORAL | 1 refills | Status: DC

## 2018-11-21 MED ORDER — METFORMIN HCL 500 MG PO TABS
500.0000 mg | ORAL_TABLET | Freq: Two times a day (BID) | ORAL | 1 refills | Status: DC
Start: 2018-11-21 — End: 2020-04-02

## 2018-11-21 MED ORDER — LISINOPRIL 5 MG PO TABS: 5.0000 mg | ORAL_TABLET | Freq: Every day | ORAL | 1 refills | Status: DC

## 2018-11-21 NOTE — Patient Instructions (Addendum)
   Try to increase walking to most days per week, with 147min exercise per week. Watch portion sizes - especially at dinner, and avoid late night meals. . Goal of 1 pound per week weight loss initially - 12-15 pounds by next visit. Let me know if you want to meet with weight loss specialist.   I do recommend flu and pneumonia vaccines when you are ready. [email or web address]  We will recheck cholesterol at follow up visit.   Thanks for coming in today. Stay safe.    If you have lab work done today you will be contacted with your lab results within the next 2 weeks.  If you have not heard from [place] then please contact us. The fastest way to get your results is to register for My Chart.   IF you received an x-ray today, you will receive an invoice from [facility]. Please contact [facility] at [phone] with questions or concerns regarding your invoice.   IF you received labwork today, you will receive an invoice from [place]. Please contact [facility] at [phone] with questions or concerns regarding your invoice.   Our billing staff will not be able to assist you with questions regarding bills from these companies.  You will be contacted with the lab results as soon as they are available. The fastest way to get your results is to activate your My Chart account. Instructions are located on the last page of this paperwork. If you have not heard from [place] regarding the results in 2 weeks, please contact this office.

## 2018-11-21 NOTE — Progress Notes (Signed)
Subjective:    Patient ID: Carlos Phillips, male    DOB: 26-Oct-1965, 53 y.o.   MRN: [id number]  HPI . Carlos Phillips is a 53 y.o. male Presents today for: Chief Complaint  Patient presents with  . Hypertension     3 month f/u. Patient need refills on pended medication   Hypertension: BP Readings from Last 3 Encounters:  11/21/18 135/80  08/17/18 114/78  08/03/18 (!) 148/90   Lab Results  Component Value Date   CREATININE 1.21 08/03/2018  Improved in May after addition of 5 mg lisinopril daily.  Continued amlodipine 10 mg daily, clonidine 0.1 mg twice daily, hydrochlorothiazide 25 mg daily. No new side effects.  No home readings.  No added salt to diet - avoiding fast food.     Hyperlipidemia:  Lab Results  Component Value Date   CHOL 208 (H) 08/03/2018   HDL 43 08/03/2018   LDLCALC 117 (H) 08/03/2018   TRIG 240 (H) 08/03/2018   CHOLHDL 4.8 08/03/2018   Lab Results  Component Value Date   ALT 35 08/03/2018   AST 20 08/03/2018   ALKPHOS 95 08/03/2018   BILITOT 0.3 08/03/2018  Currently on Lipitor 20 mg daily.  We discussed elevated readings in May, option of higher dosing of meds, plan to increase his diet/exercise approaches with repeat testing in 3 to 6 months.   Obesity: Body mass index is 53.25 kg/m. walking on occasion - 2 days per week. Still trying to lose weight on own - declined medical or surgical bariatric specialist eval. Eating breakfast No soda/sweet tea. 2 alcoholic drinks on weekends.  Some late night meals.    Wt Readings from Last 3 Encounters:  11/21/18 (!) 390 lb 9.6 oz (177.2 kg)  08/17/18 (!) 392 lb 3.2 oz (177.9 kg)  08/03/18 (!) 391 lb 12.8 oz (177.7 kg)    Diabetes: Complicated by peripheral vascular disease, obesity. Metformin 500 mg twice daily.  He is on ACE inhibitor as well as statin as above. Plavix for history of peripheral vascular disease. Microalbumin: Normal on 08/03/2018.   Home readings - 120, no symptomatic  lows. No 200's.  Optho, foot exam, pneumovax: Up-to-date ophthalmology exam, foot exam  due for flu vaccine, Pneumovax vaccine- refuses today.   Lab Results  Component Value Date   HGBA1C 6.7 (H) 08/03/2018   HGBA1C 6.6 (H) 02/17/2018   HGBA1C 6.6 (H) 06/17/2017   Lab Results  Component Value Date   MICROALBUR 0.3 06/21/2015   LDLCALC 117 (H) 08/03/2018   CREATININE 1.21 08/03/2018       Patient Active Problem List   Diagnosis Date Noted  . Hyperlipidemia 12/31/2016  . PVD (peripheral vascular disease) ([place]) 01/29/2016  . Diverticulitis 01/28/2016  . Special screening for malignant neoplasms, colon   . Type 2 diabetes mellitus with diabetic dermatitis, without long-term current use of insulin ([place]) 05/03/2015  . History of deep vein thrombosis (DVT) of lower extremity 03/07/2013  . Ischemia of extremity 02/25/2013  . HTN (hypertension) 05/07/2011  . Erectile dysfunction 05/07/2011  . Obesity 05/07/2011   Past Medical History:  Diagnosis Date  . Diabetes mellitus without complication ([place])   . DVT (deep venous thrombosis) ([place]) 02/2013  . Erectile dysfunction   . Hyperlipidemia   . Hypertension   . Obesity    BMI >52  . PVD (peripheral vascular disease) ([place])    Past Surgical History:  Procedure Laterality Date  . ABDOMINAL AORTAGRAM N/A 02/28/2013   Procedure: ABDOMINAL AORTAGRAM;  Surgeon: Conrad Stein[place], MD;  Location: [facility] CATH LAB;  Service: Cardiovascular;  Laterality: N/A;  . COLONOSCOPY WITH PROPOFOL N/A 12/25/2015   Procedure: COLONOSCOPY WITH PROPOFOL;  Surgeon: Manus Gunning, MD;  Location: [facility] ENDOSCOPY;  Service: Gastroenterology;  Laterality: N/A;  . EMBOLECTOMY Left 02/25/2013   Procedure: Left Popliteal EMBOLECTOMY Poss fasciotomy;  Surgeon: Elam Dutch, MD;  Location: [facility] OR;  Service: Vascular;  Laterality: Left;  Left poplital and Tibial embolectomy with four compartment Fasciotomy with vein patch angioplasty left popliteal artery.    Allergies  Allergen Reactions  . Morphine And Related Nausea And Vomiting   Prior to Admission medications   Medication Sig Start Date End Date Taking? Authorizing Provider  amLODipine (NORVASC) 10 MG tablet TAKE 1 TABLET(10 MG) BY MOUTH DAILY 09/25/18  Yes Wendie Agreste, MD  atorvastatin (LIPITOR) 20 MG tablet TAKE 1 TABLET(20 MG) BY MOUTH EVERY MORNING 09/25/18  Yes Wendie Agreste, MD  Blood Glucose Monitoring Suppl (GLUCOCOM BLOOD GLUCOSE MONITOR) DEVI 1 kit by Does not apply route once. 06/21/15  Yes Wendie Agreste, MD  cloNIDine (CATAPRES) 0.1 MG tablet TAKE ONE TABLET BY MOUTH TWICE DAILY 02/17/18  Yes Emeterio Reeve, DO  clopidogrel (PLAVIX) 75 MG tablet TAKE 1 TABLET BY MOUTH DAILY WITH BREAKFAST 10/04/18  Yes Wendie Agreste, MD  glucose blood test strip To test blood sugar once per day. Dx 250.00.  Brand per insurance coverage. 02/17/18  Yes Emeterio Reeve, DO  hydrochlorothiazide (HYDRODIURIL) 25 MG tablet Take 1 tablet (25 mg total) by mouth daily. 10/05/18  Yes Wendie Agreste, MD  Lancet Device MISC To test blood sugar once per day. Dx 250.00.  Brand per insurance coverage. 02/17/18  Yes Emeterio Reeve, DO  lisinopril (ZESTRIL) 5 MG tablet Take 1 tablet (5 mg total) by mouth daily. 08/03/18  Yes Wendie Agreste, MD  metFORMIN (GLUCOPHAGE) 500 MG tablet Take 1 tablet (500 mg total) by mouth 2 (two) times daily with a meal. 02/17/18  Yes Emeterio Reeve, DO  Multiple Vitamins-Minerals (MULTIVITAMIN WITH MINERALS) tablet Take 1 tablet by mouth daily.   Yes [provider]  sildenafil (VIAGRA) 100 MG tablet Take 0.5-1 tablets (50-100 mg total) by mouth daily as needed for erectile dysfunction. 02/17/18 02/16/19 Yes Emeterio Reeve, DO   Social History   Socioeconomic History  . Marital status: Married    Spouse name: Ivin Booty  . Number of children: 2  . Years of education: Not on file  . Highest education level: Not on file  Occupational History   . 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 Financial resource strain: Not on file  . Food insecurity    Worry: Not on file    Inability: Not on file  . Transportation needs    Medical: Not on file    Non-medical: Not on file  Tobacco Use  . Smoking status: Former Smoker    Packs/day: 1.00    Years: 30.00    Pack years: 30.00    Types: Cigarettes    Quit date: 02/25/2013    Years since quitting: 5.7  . Smokeless tobacco: Never Used  Substance and Sexual Activity  . Alcohol use: Yes    Alcohol/week: 4.0 standard drinks    Types: 4 Standard drinks or equivalent per week    Comment: social- rare  . Drug use: No  . Sexual activity: Yes    Partners: Female  Lifestyle  . Physical activity    Days per  week: Not on file    Minutes per session: Not on file  . Stress: Not on file  Relationships  . Social Herbalist on phone: Not on file    Gets together: Not on file    Attends religious service: Not on file    Active member of club or organization: Not on file    Attends meetings of clubs or organizations: Not on file    Relationship status: Not on file  . Intimate partner violence    Fear of current or ex partner: Not on file    Emotionally abused: Not on file    Physically abused: Not on file    Forced sexual activity: Not on file  Other Topics Concern  . Not on file  Social History Narrative  . Not on file    Review of Systems  Constitutional: Negative for fatigue and unexpected weight change.  Eyes: Negative for visual disturbance.  Respiratory: Negative for cough, chest tightness and shortness of breath.   Cardiovascular: Negative for chest pain, palpitations and leg swelling.  Gastrointestinal: Negative for abdominal pain and blood in stool.  Neurological: Negative for dizziness, light-headedness and headaches.       Objective:   Physical Exam Vitals signs reviewed.  Constitutional:      Appearance: He is well-developed. He is obese.  HENT:      Head: Normocephalic and atraumatic.  Eyes:     Pupils: Pupils are equal, round, and reactive to light.  Neck:     Vascular: No carotid bruit or JVD.  Cardiovascular:     Rate and Rhythm: Normal rate and regular rhythm.     Heart sounds: Normal heart sounds. No murmur.  Pulmonary:     Effort: Pulmonary effort is normal.     Breath sounds: Normal breath sounds. No rales.  Skin:    General: Skin is warm and dry.  Neurological:     Mental Status: He is alert and oriented to person, place, and time.    Vitals:   11/21/18 1449 11/21/18 1508  BP: 139/90 135/80  Pulse: 93   Resp: 16   Temp: 98.3 F (36.8 C)   TempSrc: Oral   SpO2: 98%   Weight: (!) 390 lb 9.6 oz (177.2 kg)        Assessment & Plan:   Carlos Phillips is a 53 y.o. male Controlled type 2 diabetes mellitus with other circulatory complication, without long-term current use of insulin ([place]) - Plan: metFORMIN (GLUCOPHAGE) 500 MG tablet, Hemoglobin A1c  -Repeat A1c, continue metformin same dose for now.  -Declines flu/pneumonia vaccinations at this time.  Essential hypertension - Plan: amLODipine (NORVASC) 10 MG tablet, hydrochlorothiazide (HYDRODIURIL) 25 MG tablet, lisinopril (ZESTRIL) 5 MG tablet  -Stable, continue same regimen.  Mixed hyperlipidemia - Plan: atorvastatin (LIPITOR) 20 MG tablet  -Discussed higher dose of statin, would like to try diet and exercise approach further - plan for recheck in 3 months.  PVD (peripheral vascular disease) ([place]) - Plan: clopidogrel (PLAVIX) 75 MG tablet  -Stable, continue Plavix, no new bleeding  BMI 50.0-59.9, adult ([place])  -Weight loss options again discussed with meeting with medical bariatric specialist or surgical bariatric specialist..  He would like to try diet and exercise approach on his own initially with goal of 12 to 15 pound weight loss over the next 3 months.  Meds ordered this encounter  Medications  . amLODipine (NORVASC) 10 MG tablet    Sig: TAKE 1  TABLET(10 MG) BY MOUTH DAILY    Dispense:  90 tablet    Refill:  1  . atorvastatin (LIPITOR) 20 MG tablet    Sig: TAKE 1 TABLET(20 MG) BY MOUTH EVERY MORNING    Dispense:  90 tablet    Refill:  1  . clopidogrel (PLAVIX) 75 MG tablet    Sig: Take 1 tablet (75 mg total) by mouth daily with breakfast.    Dispense:  90 tablet    Refill:  1  . hydrochlorothiazide (HYDRODIURIL) 25 MG tablet    Sig: Take 1 tablet (25 mg total) by mouth daily.    Dispense:  90 tablet    Refill:  1  . lisinopril (ZESTRIL) 5 MG tablet    Sig: Take 1 tablet (5 mg total) by mouth daily.    Dispense:  90 tablet    Refill:  1  . metFORMIN (GLUCOPHAGE) 500 MG tablet    Sig: Take 1 tablet (500 mg total) by mouth 2 (two) times daily with a meal.    Dispense:  180 tablet    Refill:  1   Patient Instructions     Try to increase walking to most days per week, with 162mn exercise per week. Watch portion sizes - especially at dinner, and avoid late night meals. . Goal of 1 pound per week weight loss initially - 12-15 pounds by next visit. Let me know if you want to meet with weight loss specialist.   I do recommend flu and pneumonia vaccines when you are ready. [email or web address] We will recheck cholesterol at follow up visit.   Thanks for coming in today. Stay safe.    If you have lab work done today you will be contacted with your lab results within the next 2 weeks.  If you have not heard from [place] please contact [place] The fastest way to get your results is to register for My Chart.   IF you received an x-ray today, you will receive an invoice from [facility]. Please contact [facility] at [phone] questions or concerns regarding your invoice.   IF you received labwork today, you will receive an invoice from [place] Please contact [facility] at [phone] questions or concerns regarding your invoice.   Our billing staff will not be able to  assist you with questions regarding bills from these companies.  You will be contacted with the lab results as soon as they are available. The fastest way to get your results is to activate your My Chart account. Instructions are located on the last page of this paperwork. If you have not heard from [place] the results in 2 weeks, please contact this office.

## 2018-11-22 ENCOUNTER — Encounter: Payer: Self-pay | Admitting: Family Medicine

## 2018-11-22 LAB — HEMOGLOBIN A1C
Est. average glucose Bld gHb Est-mCnc: 143 mg/dL
Hgb A1c MFr Bld: 6.6 % — ABNORMAL HIGH (ref 4.8–5.6)

## 2018-12-04 ENCOUNTER — Encounter: Payer: Self-pay | Admitting: Radiology

## 2019-01-01 ENCOUNTER — Encounter: Payer: Self-pay | Admitting: Gastroenterology

## 2019-01-09 ENCOUNTER — Telehealth: Payer: Self-pay | Admitting: Family Medicine

## 2019-02-12 ENCOUNTER — Telehealth: Payer: Self-pay | Admitting: Family Medicine

## 2019-02-12 NOTE — Telephone Encounter (Signed)
Pt has brought his Wellness Program form to be filled out. I have placed in Dr. Vonna Kotyk cma's box. He is wanting this by Thursday for his job. Please advise when ready-his wife will come and pick up. [phone]

## 2019-02-22 ENCOUNTER — Ambulatory Visit: Payer: BC Managed Care – PPO | Admitting: Family Medicine

## 2019-04-24 ENCOUNTER — Ambulatory Visit: Payer: BC Managed Care – PPO

## 2019-04-24 ENCOUNTER — Other Ambulatory Visit: Payer: Self-pay | Admitting: Family Medicine

## 2019-04-24 ENCOUNTER — Encounter: Payer: Self-pay | Admitting: Family Medicine

## 2019-04-24 ENCOUNTER — Other Ambulatory Visit: Payer: Self-pay

## 2019-04-24 ENCOUNTER — Ambulatory Visit (INDEPENDENT_AMBULATORY_CARE_PROVIDER_SITE_OTHER): Payer: Self-pay | Admitting: Family Medicine

## 2019-04-24 VITALS — BP 138/88 | HR 80 | Temp 98.9°F | Ht 72.0 in | Wt 394.0 lb

## 2019-04-24 DIAGNOSIS — E782 Mixed hyperlipidemia: Secondary | ICD-10-CM

## 2019-04-24 DIAGNOSIS — E1159 Type 2 diabetes mellitus with other circulatory complications: Secondary | ICD-10-CM | POA: Diagnosis not present

## 2019-04-24 DIAGNOSIS — I739 Peripheral vascular disease, unspecified: Secondary | ICD-10-CM

## 2019-04-24 DIAGNOSIS — I10 Essential (primary) hypertension: Secondary | ICD-10-CM

## 2019-04-24 DIAGNOSIS — Z0289 Encounter for other administrative examinations: Secondary | ICD-10-CM

## 2019-04-24 DIAGNOSIS — Z024 Encounter for examination for driving license: Secondary | ICD-10-CM

## 2019-04-24 NOTE — Progress Notes (Signed)
Labs for upcoming appt 04/26/19.

## 2019-04-24 NOTE — Progress Notes (Signed)
Subjective:  Patient ID: Carlos Phillips, male    DOB: September 25, 1965  Age: 54 y.o. MRN: [id number]  CC: No chief complaint on file.   HPI Carlos Phillips presents for   DOT physical.  Last physical April 17, 2018, 1 year card with hypertension, diabetes.  Diabetes control based on last A1c was 6.6 in August 2020.  He is not on insulin.  Denies side effects from medications. No symptomatic lows.  No neuropathy known.   History of hypertension, hyperlipidemia, denies side effects of medications.  Denies history of obstructive sleep apnea, daytime somnolence, rare snoring, no pauses with breathing at night. Well rested in am and no naps during day. No caffeine needed.  History of peripheral vascular disease that has been stable on Plavix without new bleeding.  Denies loss of use of hand/foot or other msk weakness.   No history of glaucoma/aphakia or other eye disorders known.  Reading glasses only.    Lab Results  Component Value Date   HGBA1C 6.6 (H) 11/21/2018     History Patient Active Problem List   Diagnosis Date Noted  . Hyperlipidemia 12/31/2016  . PVD (peripheral vascular disease) ([place]) 01/29/2016  . Diverticulitis 01/28/2016  . Special screening for malignant neoplasms, colon   . Type 2 diabetes mellitus with diabetic dermatitis, without long-term current use of insulin ([place]) 05/03/2015  . History of deep vein thrombosis (DVT) of lower extremity 03/07/2013  . Ischemia of extremity 02/25/2013  . HTN (hypertension) 05/07/2011  . Erectile dysfunction 05/07/2011  . Obesity 05/07/2011   Past Medical History:  Diagnosis Date  . Diabetes mellitus without complication ([place])   . DVT (deep venous thrombosis) ([place]) 02/2013  . Erectile dysfunction   . Hyperlipidemia   . Hypertension   . Obesity    BMI >52  . PVD (peripheral vascular disease) ([place])    Past Surgical History:  Procedure Laterality Date  . ABDOMINAL AORTAGRAM N/A 02/28/2013   Procedure: ABDOMINAL  Maxcine Ham;  Surgeon: Conrad Clifton, MD;  Location: [facility] CATH LAB;  Service: Cardiovascular;  Laterality: N/A;  . COLONOSCOPY WITH PROPOFOL N/A 12/25/2015   Procedure: COLONOSCOPY WITH PROPOFOL;  Surgeon: Manus Gunning, MD;  Location: [facility] ENDOSCOPY;  Service: Gastroenterology;  Laterality: N/A;  . EMBOLECTOMY Left 02/25/2013   Procedure: Left Popliteal EMBOLECTOMY Poss fasciotomy;  Surgeon: Elam Dutch, MD;  Location[facility] OR;  Service: Vascular;  Laterality: Left;  Left poplital and Tibial embolectomy with four compartment Fasciotomy with vein patch angioplasty left popliteal artery.   Allergies  Allergen Reactions  . Morphine And Related Nausea And Vomiting   Prior to Admission medications   Medication Sig Start Date End Date Taking? Authorizing Provider  amLODipine (NORVASC) 10 MG tablet TAKE 1 TABLET(10 MG) BY MOUTH DAILY 11/21/18   Wendie Agreste, MD  atorvastatin (LIPITOR) 20 MG tablet TAKE 1 TABLET(20 MG) BY MOUTH EVERY MORNING 11/21/18   Wendie Agreste, MD  Blood Glucose Monitoring Suppl (GLUCOCOM BLOOD GLUCOSE MONITOR) DEVI 1 kit by Does not apply route once. 06/21/15   Wendie Agreste, MD  cloNIDine (CATAPRES) 0.1 MG tablet TAKE ONE TABLET BY MOUTH TWICE DAILY 02/17/18   Emeterio Reeve, DO  clopidogrel (PLAVIX) 75 MG tablet Take 1 tablet (75 mg total) by mouth daily with breakfast. 11/21/18   Wendie Agreste, MD  glucose blood test strip To test blood sugar once per day. Dx 250.00.  Brand per insurance coverage. 02/17/18   Emeterio Reeve, DO  hydrochlorothiazide (HYDRODIURIL)  25 MG tablet Take 1 tablet (25 mg total) by mouth daily. 11/21/18   Wendie Agreste, MD  Lancet Device MISC To test blood sugar once per day. Dx 250.00.  Brand per insurance coverage. 02/17/18   Emeterio Reeve, DO  lisinopril (ZESTRIL) 5 MG tablet Take 1 tablet (5 mg total) by mouth daily. 11/21/18   Wendie Agreste, MD  metFORMIN (GLUCOPHAGE) 500 MG tablet Take 1 tablet (500 mg total) by  mouth 2 (two) times daily with a meal. 11/21/18   Wendie Agreste, MD  Multiple Vitamins-Minerals (MULTIVITAMIN WITH MINERALS) tablet Take 1 tablet by mouth daily.    [provider]  sildenafil (VIAGRA) 100 MG tablet Take 0.5-1 tablets (50-100 mg total) by mouth daily as needed for erectile dysfunction. 02/17/18 02/16/19  Emeterio Reeve, DO   Social History   Socioeconomic History  . Marital status: Married    Spouse name: Ivin Booty  . Number of children: 2  . Years of education: Not on file  . Highest education level: Not on file  Occupational History  . Occupation: Truck Geophysicist/field seismologist  Tobacco Use  . Smoking status: Former Smoker    Packs/day: 1.00    Years: 30.00    Pack years: 30.00    Types: Cigarettes    Quit date: 02/25/2013    Years since quitting: 6.1  . Smokeless tobacco: Never Used  Substance and Sexual Activity  . Alcohol use: Yes    Alcohol/week: 4.0 standard drinks    Types: 4 Standard drinks or equivalent per week    Comment: social- rare  . Drug use: No  . Sexual activity: Yes    Partners: Female  Other Topics Concern  . Not on file  Social History Narrative  . Not on file   Social Determinants of Health   Financial Resource Strain:   . Difficulty of Paying Living Expenses: Not on file  Food Insecurity:   . Worried About Charity fundraiser in the Last Year: Not on file  . Ran Out of Food in the Last Year: Not on file  Transportation Needs:   . Lack of Transportation (Medical): Not on file  . Lack of Transportation (Non-Medical): Not on file  Physical Activity:   . Days of Exercise per Week: Not on file  . Minutes of Exercise per Session: Not on file  Stress:   . Feeling of Stress : Not on file  Social Connections:   . Frequency of Communication with Friends and Family: Not on file  . Frequency of Social Gatherings with Friends and Family: Not on file  . Attends Religious Services: Not on file  . Active Member of Clubs or Organizations: Not  on file  . Attends Archivist Meetings: Not on file  . Marital Status: Not on file  Intimate Partner Violence:   . Fear of Current or Ex-Partner: Not on file  . Emotionally Abused: Not on file  . Physically Abused: Not on file  . Sexually Abused: Not on file    Review of Systems  review of systems per driver survey noted.  Negative other than as indicated above or in HPI.    Objective:   Vitals:   04/24/19 1624  BP: 138/88  Pulse: 80  Temp: 98.9 F (37.2 C)  TempSrc: Temporal  SpO2: 99%  Weight: (!) 394 lb (178.7 kg)  Height: 6' (1.829 m)    Physical Exam Vitals reviewed.  Constitutional:      Appearance: He is  well-developed.  HENT:     Head: Normocephalic and atraumatic.     Right Ear: External ear normal.     Left Ear: External ear normal.  Eyes:     Conjunctiva/sclera: Conjunctivae normal.     Pupils: Pupils are equal, round, and reactive to light.  Neck:     Thyroid: No thyromegaly.  Cardiovascular:     Rate and Rhythm: Normal rate and regular rhythm.     Heart sounds: Normal heart sounds.  Pulmonary:     Effort: Pulmonary effort is normal. No respiratory distress.     Breath sounds: Normal breath sounds. No wheezing.  Abdominal:     General: There is no distension.     Palpations: Abdomen is soft.     Tenderness: There is no abdominal tenderness.     Hernia: There is no hernia in the left inguinal area or right inguinal area.  Musculoskeletal:        General: No tenderness. Normal range of motion.     Cervical back: Normal range of motion and neck supple.  Lymphadenopathy:     Cervical: No cervical adenopathy.  Skin:    General: Skin is warm and dry.  Neurological:     Mental Status: He is alert and oriented to person, place, and time.     Deep Tendon Reflexes: Reflexes are normal and symmetric.  Psychiatric:        Mood and Affect: Mood normal.        Behavior: Behavior normal.        Assessment & Plan:  Carlos Phillips is a  54 y.o. male . Encounter for examination required by Department of Transportation (DOT)  1 year card provided with history of hypertension, diabetes, no concerns on exam or history.  Follow-up with primary care provider as scheduled.  See paperwork.    No orders of the defined types were placed in this encounter.  There are no Patient Instructions on file for this visit.    Signed, [name], MD [facility]

## 2019-04-24 NOTE — Patient Instructions (Signed)
1 year card for DOT. Follow up for primary care visit as planned. Labs were ordered to discuss at that visit.      If you have lab work done today you will be contacted with your lab results within the next 2 weeks.  If you have not heard from [place] then please contact us. The fastest way to get your results is to register for My Chart.   IF you received an x-ray today, you will receive an invoice from [facility]. Please contact [facility] at [phone] with questions or concerns regarding your invoice.   IF you received labwork today, you will receive an invoice from [place]. Please contact [facility] at [phone] with questions or concerns regarding your invoice.   Our billing staff will not be able to assist you with questions regarding bills from these companies.  You will be contacted with the lab results as soon as they are available. The fastest way to get your results is to activate your My Chart account. Instructions are located on the last page of this paperwork. If you have not heard from [place] regarding the results in 2 weeks, please contact this office.

## 2019-04-25 LAB — HEMOGLOBIN A1C
Est. average glucose Bld gHb Est-mCnc: 148 mg/dL
Hgb A1c MFr Bld: 6.8 % — ABNORMAL HIGH (ref 4.8–5.6)

## 2019-04-25 LAB — COMPREHENSIVE METABOLIC PANEL
ALT: 19 IU/L (ref 0–44)
AST: 20 IU/L (ref 0–40)
Albumin/Globulin Ratio: 1.6 (ref 1.2–2.2)
Albumin: 4.1 g/dL (ref 3.8–4.9)
Alkaline Phosphatase: 85 IU/L (ref 39–117)
BUN/Creatinine Ratio: 11 (ref 9–20)
BUN: 14 mg/dL (ref 6–24)
Bilirubin Total: 0.3 mg/dL (ref 0.0–1.2)
CO2: 25 mmol/L (ref 20–29)
Calcium: 10 mg/dL (ref 8.7–10.2)
Chloride: 102 mmol/L (ref 96–106)
Creatinine, Ser: 1.26 mg/dL (ref 0.76–1.27)
GFR calc Af Amer: 74 mL/min/{1.73_m2} (ref >59)
GFR calc non Af Amer: 64 mL/min/{1.73_m2} (ref >59)
Globulin, Total: 2.6 g/dL (ref 1.5–4.5)
Glucose: 125 mg/dL — ABNORMAL HIGH (ref 65–99)
Potassium: 4.3 mmol/L (ref 3.5–5.2)
Sodium: 141 mmol/L (ref 134–144)
Total Protein: 6.7 g/dL (ref 6.0–8.5)

## 2019-04-25 LAB — LIPID PANEL
Chol/HDL Ratio: 3.3 ratio (ref 0.0–5.0)
Cholesterol, Total: 180 mg/dL (ref 100–199)
HDL: 55 mg/dL (ref >39)
LDL Chol Calc (NIH): 97 mg/dL (ref 0–99)
Triglycerides: 164 mg/dL — ABNORMAL HIGH (ref 0–149)
VLDL Cholesterol Cal: 28 mg/dL (ref 5–40)

## 2019-04-26 ENCOUNTER — Ambulatory Visit: Payer: BC Managed Care – PPO | Admitting: Family Medicine

## 2019-04-29 ENCOUNTER — Encounter: Payer: Self-pay | Admitting: Radiology

## 2019-06-12 ENCOUNTER — Other Ambulatory Visit: Payer: Self-pay

## 2019-06-12 DIAGNOSIS — N529 Male erectile dysfunction, unspecified: Secondary | ICD-10-CM

## 2019-06-12 MED ORDER — SILDENAFIL CITRATE 100 MG PO TABS: 50.0000 mg | ORAL_TABLET | Freq: Every day | ORAL | 0 refills | Status: DC | PRN

## 2020-01-30 ENCOUNTER — Other Ambulatory Visit: Payer: Self-pay | Admitting: Family Medicine

## 2020-01-30 DIAGNOSIS — E782 Mixed hyperlipidemia: Secondary | ICD-10-CM

## 2020-01-30 DIAGNOSIS — I10 Essential (primary) hypertension: Secondary | ICD-10-CM

## 2020-01-30 NOTE — Telephone Encounter (Signed)
Requested medication (s) are due for refill today: Yes  Requested medication (s) are on the active medication list: Yes  Last refill:  11/21/18  Future visit scheduled: No  Notes to clinic:  Prescriptions have expired.    Requested Prescriptions  Pending Prescriptions Disp Refills   atorvastatin (LIPITOR) 20 MG tablet [Pharmacy Med Name: ATORVASTATIN 20 MG TABLET] 30 tablet 2    Sig: TAKE 1 TABLET BY MOUTH EVERY MORNING      Cardiovascular:  Antilipid - Statins Failed - 01/30/2020  3:16 PM      Failed - LDL in normal range and within 360 days    LDL Chol Calc (NIH)  Date Value Ref Range Status  04/24/2019 97 0 - 99 mg/dL Final          Failed - Triglycerides in normal range and within 360 days    Triglycerides  Date Value Ref Range Status  04/24/2019 164 (H) 0 - 149 mg/dL Final          Passed - Total Cholesterol in normal range and within 360 days    Cholesterol, Total  Date Value Ref Range Status  04/24/2019 180 100 - 199 mg/dL Final          Passed - HDL in normal range and within 360 days    HDL  Date Value Ref Range Status  04/24/2019 55 >39 mg/dL Final          Passed - Patient is not pregnant      Passed - Valid encounter within last 12 months    Recent Outpatient Visits           9 months ago Encounter for examination required by Department of Transportation (DOT)   [facility], Ranell Patrick, MD   1 year ago Controlled type 2 diabetes mellitus with other circulatory complication, without long-term current use of insulin [facility])   [facility], Ranell Patrick, MD   1 year ago Essential hypertension   [facility], Ranell Patrick, MD   1 year ago Type 2 diabetes mellitus with diabetic dermatitis, without long-term current use of insulin [facility])   [facility], Ranell Patrick, MD   1 year ago 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   [facility] [place], [place], MD                hydrochlorothiazide (HYDRODIURIL) 25 MG tablet [Pharmacy Med Name: HYDROCHLOROTHIAZIDE 25 MG TAB] 30 tablet 1    Sig: TAKE 1 TABLET BY MOUTH EVERY DAY      Cardiovascular: Diuretics - Thiazide Failed - 01/30/2020  3:16 PM      Failed - Valid encounter within last 6 months    Recent Outpatient Visits           9 months ago Encounter for examination required by Department of Transportation (DOT)   [facility], Ranell Patrick, MD   1 year ago Controlled type 2 diabetes mellitus with other circulatory complication, without long-term current use of insulin [facility])   [facility], Ranell Patrick, MD   1 year ago Essential hypertension   [facility], Ranell Patrick, MD   1 year ago Type 2 diabetes mellitus with diabetic dermatitis, without long-term current use of insulin [facility])   [facility], Ranell Patrick, MD   1 year ago Encounter for  Department of Transportation (DOT) examination for trucking [facility] [place], [place], MD              [place] in normal range and within 360 days    Calcium  Date Value Ref Range Status  04/24/2019 10.0 8.7 - 10.2 mg/dL Final          Passed - Cr in normal range and within 360 days    Creat  Date Value Ref Range Status  01/28/2016 1.44 (H) 0.70 - 1.33 mg/dL Final    Comment:      For patients > or = 55 years of age: The upper reference limit for Creatinine is approximately 13% higher for people identified as African-American.      Creatinine, Ser  Date Value Ref Range Status  04/24/2019 1.26 0.76 - 1.27 mg/dL Final          Passed - K in normal range and within 360 days    Potassium  Date Value Ref Range Status  04/24/2019 4.3 3.5 - 5.2 mmol/L Final          Passed - Na in normal range and within 360 days    Sodium  Date Value Ref Range Status  04/24/2019 141 134 - 144 mmol/L Final          Passed  - Last BP in normal range    BP Readings from Last 1 Encounters:  04/24/19 138/88            amLODipine (NORVASC) 10 MG tablet [Pharmacy Med Name: AMLODIPINE BESYLATE 10 MG TAB] 30 tablet 1    Sig: TAKE 1 TABLET BY MOUTH EVERY DAY      Cardiovascular:  Calcium Channel Blockers Failed - 01/30/2020  3:16 PM      Failed - Valid encounter within last 6 months    Recent Outpatient Visits           9 months ago Encounter for examination required by Department of Transportation (DOT)   [facility], Ranell Patrick, MD   1 year ago Controlled type 2 diabetes mellitus with other circulatory complication, without long-term current use of insulin [facility])   [facility], Ranell Patrick, MD   1 year ago Essential hypertension   [facility], Ranell Patrick, MD   1 year ago Type 2 diabetes mellitus with diabetic dermatitis, without long-term current use of insulin [facility])   [facility], Ranell Patrick, MD   1 year ago 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

## 2020-02-29 ENCOUNTER — Other Ambulatory Visit: Payer: Self-pay | Admitting: Family Medicine

## 2020-02-29 DIAGNOSIS — I10 Essential (primary) hypertension: Secondary | ICD-10-CM

## 2020-03-24 ENCOUNTER — Other Ambulatory Visit: Payer: Self-pay

## 2020-03-24 ENCOUNTER — Emergency Department (HOSPITAL_COMMUNITY): Payer: BC Managed Care – PPO

## 2020-03-24 ENCOUNTER — Inpatient Hospital Stay (HOSPITAL_COMMUNITY): Payer: BC Managed Care – PPO

## 2020-03-24 ENCOUNTER — Encounter (HOSPITAL_COMMUNITY): Payer: Self-pay | Admitting: Emergency Medicine

## 2020-03-24 ENCOUNTER — Ambulatory Visit (HOSPITAL_COMMUNITY)
Admission: EM | Admit: 2020-03-24 | Discharge: 2020-03-24 | Payer: BC Managed Care – PPO | Attending: Internal Medicine | Admitting: Internal Medicine

## 2020-03-24 ENCOUNTER — Inpatient Hospital Stay (HOSPITAL_COMMUNITY)
Admission: EM | Admit: 2020-03-24 | Discharge: 2020-03-26 | DRG: 065 | Disposition: A | Discharge status: Home / Self Care | Payer: BC Managed Care – PPO | Source: Ambulatory Visit | Attending: Family Medicine | Admitting: Family Medicine

## 2020-03-24 DIAGNOSIS — E1151 Type 2 diabetes mellitus with diabetic peripheral angiopathy without gangrene: Secondary | ICD-10-CM | POA: Diagnosis not present

## 2020-03-24 DIAGNOSIS — I6381 Other cerebral infarction due to occlusion or stenosis of small artery: Principal | ICD-10-CM | POA: Diagnosis present

## 2020-03-24 DIAGNOSIS — Z885 Allergy status to narcotic agent status: Secondary | ICD-10-CM | POA: Diagnosis not present

## 2020-03-24 DIAGNOSIS — H5121 Internuclear ophthalmoplegia, right eye: Secondary | ICD-10-CM | POA: Diagnosis not present

## 2020-03-24 DIAGNOSIS — Z825 Family history of asthma and other chronic lower respiratory diseases: Secondary | ICD-10-CM

## 2020-03-24 DIAGNOSIS — N182 Chronic kidney disease, stage 2 (mild): Secondary | ICD-10-CM | POA: Diagnosis not present

## 2020-03-24 DIAGNOSIS — E1122 Type 2 diabetes mellitus with diabetic chronic kidney disease: Secondary | ICD-10-CM | POA: Diagnosis not present

## 2020-03-24 DIAGNOSIS — Z20822 Contact with and (suspected) exposure to covid-19: Secondary | ICD-10-CM | POA: Diagnosis present

## 2020-03-24 DIAGNOSIS — Z87891 Personal history of nicotine dependence: Secondary | ICD-10-CM | POA: Diagnosis not present

## 2020-03-24 DIAGNOSIS — I639 Cerebral infarction, unspecified: Secondary | ICD-10-CM

## 2020-03-24 DIAGNOSIS — H4911 Fourth [trochlear] nerve palsy, right eye: Secondary | ICD-10-CM | POA: Diagnosis present

## 2020-03-24 DIAGNOSIS — Z79899 Other long term (current) drug therapy: Secondary | ICD-10-CM

## 2020-03-24 DIAGNOSIS — H509 Unspecified strabismus: Secondary | ICD-10-CM

## 2020-03-24 DIAGNOSIS — N529 Male erectile dysfunction, unspecified: Secondary | ICD-10-CM | POA: Diagnosis not present

## 2020-03-24 DIAGNOSIS — Z7984 Long term (current) use of oral hypoglycemic drugs: Secondary | ICD-10-CM | POA: Diagnosis not present

## 2020-03-24 DIAGNOSIS — E785 Hyperlipidemia, unspecified: Secondary | ICD-10-CM | POA: Diagnosis present

## 2020-03-24 DIAGNOSIS — Z95820 Peripheral vascular angioplasty status with implants and grafts: Secondary | ICD-10-CM

## 2020-03-24 DIAGNOSIS — H532 Diplopia: Secondary | ICD-10-CM

## 2020-03-24 DIAGNOSIS — E11620 Type 2 diabetes mellitus with diabetic dermatitis: Secondary | ICD-10-CM | POA: Diagnosis present

## 2020-03-24 DIAGNOSIS — D721 Eosinophilia, unspecified: Secondary | ICD-10-CM | POA: Diagnosis present

## 2020-03-24 DIAGNOSIS — Z6841 Body Mass Index (BMI) 40.0 and over, adult: Secondary | ICD-10-CM | POA: Diagnosis not present

## 2020-03-24 DIAGNOSIS — Z86718 Personal history of other venous thrombosis and embolism: Secondary | ICD-10-CM | POA: Diagnosis not present

## 2020-03-24 DIAGNOSIS — I129 Hypertensive chronic kidney disease with stage 1 through stage 4 chronic kidney disease, or unspecified chronic kidney disease: Secondary | ICD-10-CM | POA: Diagnosis present

## 2020-03-24 DIAGNOSIS — Z833 Family history of diabetes mellitus: Secondary | ICD-10-CM | POA: Diagnosis not present

## 2020-03-24 DIAGNOSIS — H5010 Unspecified exotropia: Secondary | ICD-10-CM | POA: Diagnosis not present

## 2020-03-24 DIAGNOSIS — I10 Essential (primary) hypertension: Secondary | ICD-10-CM

## 2020-03-24 DIAGNOSIS — I739 Peripheral vascular disease, unspecified: Secondary | ICD-10-CM | POA: Diagnosis present

## 2020-03-24 DIAGNOSIS — R29701 NIHSS score 1: Secondary | ICD-10-CM | POA: Diagnosis present

## 2020-03-24 DIAGNOSIS — H499 Unspecified paralytic strabismus: Secondary | ICD-10-CM

## 2020-03-24 DIAGNOSIS — Z7902 Long term (current) use of antithrombotics/antiplatelets: Secondary | ICD-10-CM | POA: Diagnosis not present

## 2020-03-24 DIAGNOSIS — I6389 Other cerebral infarction: Secondary | ICD-10-CM | POA: Diagnosis not present

## 2020-03-24 DIAGNOSIS — E7800 Pure hypercholesterolemia, unspecified: Secondary | ICD-10-CM | POA: Diagnosis not present

## 2020-03-24 DIAGNOSIS — Z8249 Family history of ischemic heart disease and other diseases of the circulatory system: Secondary | ICD-10-CM

## 2020-03-24 DIAGNOSIS — E669 Obesity, unspecified: Secondary | ICD-10-CM | POA: Diagnosis not present

## 2020-03-24 LAB — COMPREHENSIVE METABOLIC PANEL
ALT: 19 U/L (ref 0–44)
AST: 20 U/L (ref 15–41)
Albumin: 3.4 g/dL — ABNORMAL LOW (ref 3.5–5.0)
Alkaline Phosphatase: 64 U/L (ref 38–126)
Anion gap: 9 (ref 5–15)
BUN: 11 mg/dL (ref 6–20)
CO2: 26 mmol/L (ref 22–32)
Calcium: 9.1 mg/dL (ref 8.9–10.3)
Chloride: 104 mmol/L (ref 98–111)
Creatinine, Ser: 1.25 mg/dL — ABNORMAL HIGH (ref 0.61–1.24)
GFR, Estimated: >60 mL/min (ref >60)
Glucose, Bld: 121 mg/dL — ABNORMAL HIGH (ref 70–99)
Potassium: 3.6 mmol/L (ref 3.5–5.1)
Sodium: 139 mmol/L (ref 135–145)
Total Bilirubin: 0.3 mg/dL (ref 0.3–1.2)
Total Protein: 6.1 g/dL — ABNORMAL LOW (ref 6.5–8.1)

## 2020-03-24 LAB — DIFFERENTIAL
Abs Immature Granulocytes: 0.04 10*3/uL (ref 0.00–0.07)
Basophils Absolute: 0.1 10*3/uL (ref 0.0–0.1)
Basophils Relative: 1 %
Eosinophils Absolute: 0.9 10*3/uL — ABNORMAL HIGH (ref 0.0–0.5)
Eosinophils Relative: 10 %
Immature Granulocytes: 1 %
Lymphocytes Relative: 26 %
Lymphs Abs: 2.3 10*3/uL (ref 0.7–4.0)
Monocytes Absolute: 0.7 10*3/uL (ref 0.1–1.0)
Monocytes Relative: 8 %
Neutro Abs: 4.8 10*3/uL (ref 1.7–7.7)
Neutrophils Relative %: 54 %

## 2020-03-24 LAB — CBC
HCT: 47.8 % (ref 39.0–52.0)
Hemoglobin: 15.3 g/dL (ref 13.0–17.0)
MCH: 28.9 pg (ref 26.0–34.0)
MCHC: 32 g/dL (ref 30.0–36.0)
MCV: 90.2 fL (ref 80.0–100.0)
Platelets: 241 10*3/uL (ref 150–400)
RBC: 5.3 MIL/uL (ref 4.22–5.81)
RDW: 14.5 % (ref 11.5–15.5)
WBC: 8.8 10*3/uL (ref 4.0–10.5)
nRBC: 0 % (ref 0.0–0.2)

## 2020-03-24 LAB — PROTIME-INR
INR: 1 (ref 0.8–1.2)
Prothrombin Time: 12.8 seconds (ref 11.4–15.2)

## 2020-03-24 LAB — APTT: aPTT: 30 seconds (ref 24–36)

## 2020-03-24 LAB — SARS CORONAVIRUS 2 (TAT 6-24 HRS): SARS Coronavirus 2: NEGATIVE

## 2020-03-24 MED ORDER — ENOXAPARIN SODIUM 100 MG/ML ~~LOC~~ SOLN
90.0000 mg | SUBCUTANEOUS | Status: DC
  Administered 2020-03-24 – 2020-03-25 (×2): 90 mg via SUBCUTANEOUS
  Filled 2020-03-24: qty 1
  Filled 2020-03-24 (×2): qty 0.9

## 2020-03-24 MED ORDER — ATORVASTATIN CALCIUM 40 MG PO TABS
40.0000 mg | ORAL_TABLET | Freq: Every day | ORAL | Status: DC
  Administered 2020-03-25 – 2020-03-26 (×2): 40 mg via ORAL
  Filled 2020-03-24 (×2): qty 1

## 2020-03-24 MED ORDER — ASPIRIN EC 81 MG PO TBEC
81.0000 mg | DELAYED_RELEASE_TABLET | Freq: Every day | ORAL | Status: DC
  Administered 2020-03-24 – 2020-03-26 (×3): 81 mg via ORAL
  Filled 2020-03-24 (×3): qty 1

## 2020-03-24 MED ORDER — ACETAMINOPHEN 325 MG PO TABS: 650.0000 mg | ORAL_TABLET | ORAL | Status: DC | PRN

## 2020-03-24 MED ORDER — ACETAMINOPHEN 160 MG/5ML PO SOLN: 650.0000 mg | ORAL | Status: DC | PRN

## 2020-03-24 MED ORDER — GADOBUTROL 1 MMOL/ML IV SOLN
10.0000 mL | Freq: Once | INTRAVENOUS | Status: AC | PRN
  Administered 2020-03-24: 10 mL via INTRAVENOUS

## 2020-03-24 MED ORDER — ACETAMINOPHEN 650 MG RE SUPP: 650.0000 mg | Freq: Four times a day (QID) | RECTAL | Status: DC | PRN

## 2020-03-24 MED ORDER — AMLODIPINE BESYLATE 10 MG PO TABS
10.0000 mg | ORAL_TABLET | Freq: Every day | ORAL | Status: DC
  Administered 2020-03-25 – 2020-03-26 (×2): 10 mg via ORAL
  Filled 2020-03-24: qty 2
  Filled 2020-03-24: qty 1

## 2020-03-24 MED ORDER — ACETAMINOPHEN 325 MG PO TABS: 650.0000 mg | ORAL_TABLET | Freq: Four times a day (QID) | ORAL | Status: DC | PRN

## 2020-03-24 MED ORDER — IOHEXOL 350 MG/ML SOLN
75.0000 mL | Freq: Once | INTRAVENOUS | Status: AC | PRN
  Administered 2020-03-24: 75 mL via INTRAVENOUS

## 2020-03-24 MED ORDER — CLOPIDOGREL BISULFATE 75 MG PO TABS
75.0000 mg | ORAL_TABLET | Freq: Every day | ORAL | Status: DC
  Administered 2020-03-25 – 2020-03-26 (×2): 75 mg via ORAL
  Filled 2020-03-24 (×2): qty 1

## 2020-03-24 MED ORDER — ACETAMINOPHEN 650 MG RE SUPP: 650.0000 mg | RECTAL | Status: DC | PRN

## 2020-03-24 MED ORDER — STROKE: EARLY STAGES OF RECOVERY BOOK
Freq: Once | Status: AC
  Filled 2020-03-24: qty 1

## 2020-03-24 MED ORDER — HYDROCHLOROTHIAZIDE 25 MG PO TABS
25.0000 mg | ORAL_TABLET | Freq: Every day | ORAL | Status: DC
  Administered 2020-03-24 – 2020-03-26 (×3): 25 mg via ORAL
  Filled 2020-03-24 (×3): qty 1

## 2020-03-24 MED ORDER — SODIUM CHLORIDE 0.9 % IV SOLN: INTRAVENOUS | Status: DC

## 2020-03-24 NOTE — ED Provider Notes (Signed)
[place]    CSN: [id number] Arrival date & time: 03/24/20  0809      History   Chief Complaint Chief Complaint  Patient presents with  . Eye Problem    HPI Carlos Phillips is a 54 y.o. male presenting with right eye problem. History of diabetes, DVT, ED, hyperlipidemia, hypertension, obesity, PVD. At baseline wears reading glasses but endorses good distance vision. He states that 1 day ago, he abruptly noticed his right eye vision was double. The room also looks distorted. States that the eye looks a bit askew to him. Denies changes in left eye. Feeling well otherwise, denies headaches, chest pain, shortness of breath, URI sx, nipple discharge, weakness or sensation changes in arms/legs. Denies discharge, photophobia, foreign body sensation. Denies injury.   HPI  Past Medical History:  Diagnosis Date  . Diabetes mellitus without complication ([place])   . DVT (deep venous thrombosis) ([place]) 02/2013  . Erectile dysfunction   . Hyperlipidemia   . Hypertension   . Obesity    BMI >52  . PVD (peripheral vascular disease) [facility])     Patient Active Problem List   Diagnosis Date Noted  . Hyperlipidemia 12/31/2016  . PVD (peripheral vascular disease) ([place]) 01/29/2016  . Diverticulitis 01/28/2016  . Special screening for malignant neoplasms, colon   . Type 2 diabetes mellitus with diabetic dermatitis, without long-term current use of insulin ([place]) 05/03/2015  . History of deep vein thrombosis (DVT) of lower extremity 03/07/2013  . Ischemia of extremity 02/25/2013  . HTN (hypertension) 05/07/2011  . Erectile dysfunction 05/07/2011  . Obesity 05/07/2011    Past Surgical History:  Procedure Laterality Date  . ABDOMINAL AORTAGRAM N/A 02/28/2013   Procedure: ABDOMINAL Maxcine Ham;  Surgeon: Conrad Milford, MD;  Location: [facility] CATH LAB;  Service: Cardiovascular;  Laterality: N/A;  . COLONOSCOPY WITH PROPOFOL N/A 12/25/2015   Procedure: COLONOSCOPY WITH PROPOFOL;  Surgeon:  Manus Gunning, MD;  Location: [facility] ENDOSCOPY;  Service: Gastroenterology;  Laterality: N/A;  . EMBOLECTOMY Left 02/25/2013   Procedure: Left Popliteal EMBOLECTOMY Poss fasciotomy;  Surgeon: Elam Dutch, MD;  Location: [facility] OR;  Service: Vascular;  Laterality: Left;  Left poplital and Tibial embolectomy with four compartment Fasciotomy with vein patch angioplasty left popliteal artery.       Home Medications    Prior to Admission medications   Medication Sig Start Date End Date Taking? Authorizing Provider  amLODipine (NORVASC) 10 MG tablet TAKE 1 TABLET BY MOUTH EVERY DAY 01/30/20  Yes Wendie Agreste, MD  atorvastatin (LIPITOR) 20 MG tablet TAKE 1 TABLET BY MOUTH EVERY MORNING 01/30/20  Yes Wendie Agreste, MD  cloNIDine (CATAPRES) 0.1 MG tablet TAKE ONE TABLET BY MOUTH TWICE DAILY 02/17/18  Yes Emeterio Reeve, DO  clopidogrel (PLAVIX) 75 MG tablet Take 1 tablet (75 mg total) by mouth daily with breakfast. 11/21/18  Yes Wendie Agreste, MD  hydrochlorothiazide (HYDRODIURIL) 25 MG tablet TAKE 1 TABLET BY MOUTH EVERY DAY 01/30/20  Yes Wendie Agreste, MD  Lancet Device MISC To test blood sugar once per day. Dx 250.00.  Brand per insurance coverage. 02/17/18  Yes Emeterio Reeve, DO  lisinopril (ZESTRIL) 5 MG tablet Take 1 tablet (5 mg total) by mouth daily. 11/21/18  Yes Wendie Agreste, MD  Multiple Vitamins-Minerals (MULTIVITAMIN WITH MINERALS) tablet Take 1 tablet by mouth daily.   Yes [provider]  sildenafil (VIAGRA) 100 MG tablet Take 0.5-1 tablets (50-100 mg total) by mouth daily as needed  for erectile dysfunction. 06/12/19 06/10/20 Yes Wendie Agreste, MD  metFORMIN (GLUCOPHAGE) 500 MG tablet Take 1 tablet (500 mg total) by mouth 2 (two) times daily with a meal. 11/21/18   Wendie Agreste, MD    Family History Family History  Problem Relation Age of Onset  . Diabetes Mother   . Heart disease Father   . Asthma Son   . Colon cancer Neg Hx      Social History Social History   Tobacco Use  . Smoking status: Former Smoker    Packs/day: 1.00    Years: 30.00    Pack years: 30.00    Types: Cigarettes    Quit date: 02/25/2013    Years since quitting: 7.0  . Smokeless tobacco: Never Used  Vaping Use  . Vaping Use: Never used  Substance Use Topics  . Alcohol use: Yes    Alcohol/week: 4.0 standard drinks    Types: 4 Standard drinks or equivalent per week    Comment: social- rare  . Drug use: No     Allergies   Morphine and related   Review of Systems Review of Systems  Eyes: Positive for visual disturbance.  All other systems reviewed and are negative.    Physical Exam Triage Vital Signs ED Triage Vitals  Enc Vitals Group     BP 03/24/20 0834 (!) 167/100     Pulse Rate 03/24/20 0834 87     Resp 03/24/20 0834 18     Temp 03/24/20 0834 98.6 F (37 C)     Temp Source 03/24/20 0834 Oral     SpO2 03/24/20 0834 98 %     Weight --      Height 03/24/20 0836 6' (1.829 m)     Head Circumference --      Peak Flow --      Pain Score 03/24/20 0836 0     Pain Loc --      Pain Edu? --      Excl. in [place]? --    No data found.  Updated Vital Signs BP (!) 167/100 (BP Location: Left Arm)   Pulse 87   Temp 98.6 F (37 C) (Oral)   Resp 18   Ht 6' (1.829 m)   SpO2 98%   BMI 53.44 kg/m   Visual Acuity Right Eye Distance: 20/25 Left Eye Distance: 20/15 Bilateral Distance: 20/100  Right Eye Near:   Left Eye Near:    Bilateral Near:     Physical Exam Vitals reviewed.  Constitutional:      General: He is not in acute distress.    Appearance: Normal appearance. He is not ill-appearing.  HENT:     Head: Normocephalic and atraumatic.     Right Ear: Hearing, tympanic membrane, ear canal and external ear normal. No tenderness. No middle ear effusion. There is no impacted cerumen. No mastoid tenderness. Tympanic membrane is not perforated, erythematous, retracted or bulging.     Left Ear: Hearing, tympanic  membrane, ear canal and external ear normal. No tenderness.  No middle ear effusion. There is no impacted cerumen. No mastoid tenderness. Tympanic membrane is not perforated, erythematous, retracted or bulging.     Mouth/Throat:     Pharynx: No posterior oropharyngeal erythema.     Comments: Palate raises symmetrically, uvula midline  Eyes:     General: Lids are normal. Lids are everted, no foreign bodies appreciated. No visual field deficit.       Right eye: No foreign body,  discharge or hordeolum.        Left eye: No foreign body, discharge or hordeolum.     Extraocular Movements:     Right eye: Nystagmus present.     Left eye: No nystagmus.     Conjunctiva/sclera:     Right eye: Right conjunctiva is not injected. No chemosis, exudate or hemorrhage.    Left eye: Left conjunctiva is not injected. No chemosis, exudate or hemorrhage.    Pupils: Pupils are equal, round, and reactive to light. Pupils are equal.     Visual Fields: Right eye visual fields normal and left eye visual fields normal.     Comments:  Acute onset of R exotropia, EOMI but with nystagmus, difficulty with accomodation, and double vision. Vision 20/100 R eye, 20/15 left eye. L eye exam benign, neuro exam otherwise benign.   No pain with movement, no proptosis, no injury or foreign body appreciated   Cardiovascular:     Rate and Rhythm: Normal rate and regular rhythm.     Heart sounds: Normal heart sounds.  Pulmonary:     Effort: Pulmonary effort is normal.     Breath sounds: Normal breath sounds and air entry.  Lymphadenopathy:     Cervical: No cervical adenopathy.  Neurological:     General: No focal deficit present.     Mental Status: He is alert and oriented to person, place, and time.  Psychiatric:        Attention and Perception: Attention and perception normal.        Mood and Affect: Mood and affect normal.      UC Treatments / Results  Labs (all labs ordered are listed, but only abnormal results are  displayed) Labs Reviewed - No data to display  EKG   Radiology No results found.  Procedures Procedures (including critical care time)  Medications Ordered in UC Medications - No data to display  Initial Impression / Assessment and Plan / UC Course  I have reviewed the triage vital signs and the nursing notes.  Pertinent labs & imaging results that were available during my care of the patient were reviewed by me and considered in my medical decision making (see chart for details).     Acute onset of R exotropia, EOMI but with nystagmus, difficulty with accomodation, and double vision. Vision 20/100 R eye, 20/15 left eye. L eye exam benign, neuro exam otherwise benign.   BP initially 167/100, 167/108 on recheck. Denies headaches, chest pain, shortness of breath, urinary symptoms.  Called Ophthalmology on Call Dr. Vonna Kotyk to discuss this case. Pt sent to [facility] ED for further workup. He is stable to transport via vehicle driven by wife. He understands to head to ED immediately.  Final Clinical Impressions(s) / UC Diagnoses   Final diagnoses:  Strabismus     Discharge Instructions     Head straight to [facility] ED.    ED Prescriptions    None     PDMP not reviewed this encounter.   Rhys Martini, PA-C 03/24/20 1220

## 2020-03-24 NOTE — ED Notes (Signed)
Patient to CT.

## 2020-03-24 NOTE — ED Notes (Signed)
Patient transported to MRI 

## 2020-03-24 NOTE — Discharge Instructions (Addendum)
Head straight to [place] ED 

## 2020-03-24 NOTE — ED Triage Notes (Signed)
Pt reports his vision has changed in Rt eye . Pt reports some  loss of vision . Pt reports feels like his eyes are crossing.

## 2020-03-24 NOTE — ED Provider Notes (Signed)
[facility] EMERGENCY DEPARTMENT Provider Note   CSN: [id number] Arrival date & time: 03/24/20  [id number]     History Chief Complaint  Patient presents with  . Blurred Vision    Carlos Phillips is a 54 y.o. male.  Patient with history of diabetes, hypertension, hyperlipidemia, obesity, peripheral vascular disease status post stenting in his left leg and history of DVT on Plavix --presents the emergency department today for evaluation of diplopia.  Patient states that around 1 PM yesterday he was driving in his truck and all of a sudden had double vision.  That he states was in his right eye.  When he closes either eye, the double vision improves.  Reports "slightly blurry" vision through the right eye, but no difficulty with the left.  He denies loss of vision or black spots in the vision.  Patient denies other signs of stroke including: facial droop, slurred speech, aphasia, weakness/numbness in extremities, imbalance/trouble walking.  No chest pain or shortness of breath.  No injuries to the eye or history of eye surgeries.  He does admit to missing several doses of his medications over the holiday.         Past Medical History:  Diagnosis Date  . Diabetes mellitus without complication ([place])   . DVT (deep venous thrombosis) ([place]) 02/2013  . Erectile dysfunction   . Hyperlipidemia   . Hypertension   . Obesity    BMI >52  . PVD (peripheral vascular disease) [facility])     Patient Active Problem List   Diagnosis Date Noted  . Hyperlipidemia 12/31/2016  . PVD (peripheral vascular disease) ([place]) 01/29/2016  . Diverticulitis 01/28/2016  . Special screening for malignant neoplasms, colon   . Type 2 diabetes mellitus with diabetic dermatitis, without long-term current use of insulin ([place]) 05/03/2015  . History of deep vein thrombosis (DVT) of lower extremity 03/07/2013  . Ischemia of extremity 02/25/2013  . HTN (hypertension) 05/07/2011  . Erectile dysfunction  05/07/2011  . Obesity 05/07/2011    Past Surgical History:  Procedure Laterality Date  . ABDOMINAL AORTAGRAM N/A 02/28/2013   Procedure: ABDOMINAL Maxcine Ham;  Surgeon: Conrad Fayette, MD;  Location: [facility] CATH LAB;  Service: Cardiovascular;  Laterality: N/A;  . COLONOSCOPY WITH PROPOFOL N/A 12/25/2015   Procedure: COLONOSCOPY WITH PROPOFOL;  Surgeon: Manus Gunning, MD;  Location: [facility] ENDOSCOPY;  Service: Gastroenterology;  Laterality: N/A;  . EMBOLECTOMY Left 02/25/2013   Procedure: Left Popliteal EMBOLECTOMY Poss fasciotomy;  Surgeon: Elam Dutch, MD;  Location: [facility] OR;  Service: Vascular;  Laterality: Left;  Left poplital and Tibial embolectomy with four compartment Fasciotomy with vein patch angioplasty left popliteal artery.       Family History  Problem Relation Age of Onset  . Diabetes Mother   . Heart disease Father   . Asthma Son   . Colon cancer Neg Hx     Social History   Tobacco Use  . Smoking status: Former Smoker    Packs/day: 1.00    Years: 30.00    Pack years: 30.00    Types: Cigarettes    Quit date: 02/25/2013    Years since quitting: 7.0  . Smokeless tobacco: Never Used  Vaping Use  . Vaping Use: Never used  Substance Use Topics  . Alcohol use: Yes    Alcohol/week: 4.0 standard drinks    Types: 4 Standard drinks or equivalent per week    Comment: social- rare  . Drug use: No    Home  Medications Prior to Admission medications   Medication Sig Start Date End Date Taking? Authorizing Provider  amLODipine (NORVASC) 10 MG tablet TAKE 1 TABLET BY MOUTH EVERY DAY 01/30/20   Wendie Agreste, MD  atorvastatin (LIPITOR) 20 MG tablet TAKE 1 TABLET BY MOUTH EVERY MORNING 01/30/20   Wendie Agreste, MD  cloNIDine (CATAPRES) 0.1 MG tablet TAKE ONE TABLET BY MOUTH TWICE DAILY 02/17/18   Emeterio Reeve, DO  clopidogrel (PLAVIX) 75 MG tablet Take 1 tablet (75 mg total) by mouth daily with breakfast. 11/21/18   Wendie Agreste, MD  hydrochlorothiazide  (HYDRODIURIL) 25 MG tablet TAKE 1 TABLET BY MOUTH EVERY DAY 01/30/20   Wendie Agreste, MD  Lancet Device MISC To test blood sugar once per day. Dx 250.00.  Brand per insurance coverage. 02/17/18   Emeterio Reeve, DO  lisinopril (ZESTRIL) 5 MG tablet Take 1 tablet (5 mg total) by mouth daily. 11/21/18   Wendie Agreste, MD  metFORMIN (GLUCOPHAGE) 500 MG tablet Take 1 tablet (500 mg total) by mouth 2 (two) times daily with a meal. 11/21/18   Wendie Agreste, MD  Multiple Vitamins-Minerals (MULTIVITAMIN WITH MINERALS) tablet Take 1 tablet by mouth daily.    [provider]  sildenafil (VIAGRA) 100 MG tablet Take 0.5-1 tablets (50-100 mg total) by mouth daily as needed for erectile dysfunction. 06/12/19 06/10/20  Wendie Agreste, MD    Allergies    Morphine and related  Review of Systems   Review of Systems  Constitutional: Negative for fever.  HENT: Negative for rhinorrhea and sore throat.   Eyes: Positive for visual disturbance. Negative for redness.  Respiratory: Negative for cough.   Cardiovascular: Negative for chest pain.  Gastrointestinal: Negative for abdominal pain, diarrhea, nausea and vomiting.  Genitourinary: Negative for dysuria and hematuria.  Musculoskeletal: Negative for myalgias.  Skin: Negative for rash.  Neurological: Negative for dizziness, seizures, syncope, facial asymmetry, speech difficulty, weakness, light-headedness, numbness and headaches.    Physical Exam Updated Vital Signs BP (!) 141/92   Pulse 70   Temp 98.3 F (36.8 C) (Oral)   Resp 20   Ht 6' (1.829 m)   Wt (!) 181.4 kg   SpO2 98%   BMI 54.25 kg/m   Physical Exam Vitals and nursing note reviewed.  Constitutional:      Appearance: He is well-developed and well-nourished.  HENT:     Head: Normocephalic and atraumatic.     Right Ear: Tympanic membrane, ear canal and external ear normal.     Left Ear: Tympanic membrane, ear canal and external ear normal.     Nose: Nose normal.      Mouth/Throat:     Mouth: Oropharynx is clear and moist and mucous membranes are normal.     Pharynx: Uvula midline.  Eyes:     General: Lids are normal.        Right eye: No discharge.        Left eye: No discharge.     Extraocular Movements: EOM normal.     Conjunctiva/sclera: Conjunctivae normal.     Pupils: Pupils are equal, round, and reactive to light.  Cardiovascular:     Rate and Rhythm: Normal rate and regular rhythm.     Heart sounds: Normal heart sounds.  Pulmonary:     Effort: Pulmonary effort is normal.     Breath sounds: Normal breath sounds.  Abdominal:     Palpations: Abdomen is soft.     Tenderness: There is  no abdominal tenderness.  Musculoskeletal:        General: Normal range of motion.     Cervical back: Normal range of motion and neck supple. No tenderness or bony tenderness. Normal range of motion.  Skin:    General: Skin is warm and dry.  Neurological:     Mental Status: He is alert and oriented to person, place, and time.     GCS: GCS eye subscore is 4. GCS verbal subscore is 5. GCS motor subscore is 6.     Cranial Nerves: Cranial nerve deficit present. No dysarthria or facial asymmetry.     Sensory: No sensory deficit.     Motor: No abnormal muscle tone.     Coordination: He displays a negative Romberg sign. Coordination normal.     Deep Tendon Reflexes: Strength normal and reflexes are normal and symmetric.     Comments: Patient with weak adduction of right eye with leftward gaze. Dysmetria due to visual difficulty.   Psychiatric:        Mood and Affect: Mood and affect normal.     ED Results / Procedures / Treatments   Labs (all labs ordered are listed, but only abnormal results are displayed) Labs Reviewed  DIFFERENTIAL - Abnormal; Notable for the following components:      Result Value   Eosinophils Absolute 0.9 (*)    All other components within normal limits  COMPREHENSIVE METABOLIC PANEL - Abnormal; Notable for the following components:    Glucose, Bld 121 (*)    Creatinine, Ser 1.25 (*)    Total Protein 6.1 (*)    Albumin 3.4 (*)    All other components within normal limits  SARS CORONAVIRUS 2 (TAT 6-24 HRS)  PROTIME-INR  APTT  CBC  ETHANOL  RAPID URINE DRUG SCREEN, HOSP PERFORMED  URINALYSIS, ROUTINE W REFLEX MICROSCOPIC    EKG None  Radiology MR Brain W and Wo Contrast  Result Date: 03/24/2020 CLINICAL DATA:  Weak right eye adduction. Opthalmoplegia. Diploplia. History of diabetes, DVT, hyperlipidemia, hypertension, obesity, peripheral vascular disease. EXAM: MRI HEAD WITHOUT AND WITH CONTRAST TECHNIQUE: Multiplanar, multiecho pulse sequences of the brain and surrounding structures were obtained without and with intravenous contrast. CONTRAST:  51mL GADAVIST GADOBUTROL 1 MMOL/ML IV SOLN COMPARISON:  CT head June 27, 2001. FINDINGS: Brain: Small focus of restricted diffusion in the midbrain anterior to the cerebral aqueduct at the level of the superior colliculus. No associated enhancement. Scattered T2/FLAIR hyperintensities within the white matter, which are advanced in number for age. No abnormal enhancement. Small remote lacunar infarct in the right thalamus. Vascular: Major arterial flow voids are maintained at the skull base. No acute hemorrhage. No hydrocephalus. No mass lesion. No abnormal enhancement. Skull and upper cervical spine: Normal marrow signal. Sinuses/Orbits: Mild scattered paranasal sinus mucosal thickening without air-fluid levels. Unremarkable orbits. Other: Trace bilateral mastoid effusions. IMPRESSION: 1. Small acute infarct in the midbrain, in the expected region of the right oculomotor nucleus. No substantial edema or mass effect. 2. Age advanced T2/FLAIR hyperintensities within the white matter, most likely related to chronic microvascular ischemic disease given the patient's reported risk factors. Additional differential considerations include prior demyelination, trauma, inflammation, or  migraines. 3. Small remote lacunar infarct in the right thalamus. Electronically Signed   By: Margaretha Sheffield MD   On: 03/24/2020 13:16    Procedures Procedures (including critical care time)  Medications Ordered in ED Medications  gadobutrol (GADAVIST) 1 MMOL/ML injection 10 mL (10 mLs Intravenous Contrast Given 03/24/20  1302)    ED Course  I have reviewed the triage vital signs and the nursing notes.  Pertinent labs & imaging results that were available during my care of the patient were reviewed by me and considered in my medical decision making (see chart for details).  Patient seen and examined. Work-up initiated.  Due to patient's diplopia and ophthalmoplegia, will need to rule out stroke as an etiology.  No history of MS.  Patient agrees to proceed.  Vital signs reviewed and are as follows: BP (!) 141/92   Pulse 70   Temp 98.3 F (36.8 C) (Oral)   Resp 20   Ht 6' (1.829 m)   Wt (!) 181.4 kg   SpO2 98%   BMI 54.25 kg/m   2:02 PM MRI confirms R midbrain CVA. Pt updated on results.   Discussed with Dr. Otelia Limes who will see. Will request admission.   3:19 PM Spoke earlier with [facility] who will admit.     MDM Rules/Calculators/A&P                          Admit.    Final Clinical Impression(s) / ED Diagnoses Final diagnoses:  Acute CVA (cerebrovascular accident) [facility])  Diplopia  Ophthalmoplegia of right eye    Rx / DC Orders ED Discharge Orders    None       Renne Crigler, PA-C 03/24/20 1520    Horton, Mayer Masker, MD 03/24/20 1541

## 2020-03-24 NOTE — ED Triage Notes (Signed)
Pt reports double vision in R eye since 1 pm yesterday. Denies any other associated symptoms. A/ox4, speech clear, moves all limbs equally, no nystagmus noted, ambulatory to triage. Pt seen at [facility] and sent here for further eval.

## 2020-03-24 NOTE — H&P (Addendum)
[facility] Admission History and Physical Service Pager: [phone]  Patient name: Carlos Phillips Medical record number: [id number] Date of birth: [birth year] Age: 54 y.o. Gender: male  Primary Care Provider: Wendie Agreste, MD Consultants: Neurology  Code Status: Full Preferred Emergency Contact: Jamerson Lebovits (wife) [phone]  Chief Complaint: diplopia   Assessment and Plan: AARIS [name] is a 54 y.o. male presenting with one day history of diplopia . PMH is significant for diabetes, hypertension, peripheral vascular disease status post stenting in his left leg, history of DVT, hyperlipidemia and obesity.  Acute CVA  Right midbrain infarct Patient presents experiencing diplopia for the past 24 hours. Neurological exam notable for right ptosis, binocular diplopia, normal monocular vision. Primary gaze with mild exotropia. Dysmetria noted with weak adduction of right eye. MRI notable for small acute right oculomotor midbrain infarct with small remote lacunar infarct of the right thalamus. Per patient report, no prior stroke history. Low concern for trauma given history. Differential for visual changes: Low suspicion for infectious etiology given exam and normal WBC. Not likely seizure disorder since patient lacks significant history. Can also consider diabetic retinopathy given patient's poor outpatient follow up and diabetes control. Binocular diplopia consistent with imaging findings. Neurology following for continued stroke workup and management. -Admit to FPTS, attending Dr. Owens Shark -Neurology consulted, appreciate recommendations  -continuous telemetry -risk stratification: pending A1c and lipid panel -frequent neuro checks  -dual antiplatelet therapy: aspirin 81 mg and plavix  -systolic BP goal 0000000 -pending TTE and carotid doppler -pending CTA head and neck -NPO until SLP eval  -PT/OT/SLP -am BMP   Type 2 DM Most recent A1c  6.8 from  04/24/2019. Glucose 121 on admission. Previously prescribed metformin 500 bid but reports not currently taking. -pending A1c -consider sSSI as appropriate  -outpatient opthalmology appointment   HTN Hypertensive on admission 159/103. Prescribed home meds include amlodipine 10 mg, HCTZ 25 mg, clonidine 0.1 mg bid and lisinopril 5 mg daily. Although not for certain patient is compliant with all 4 antihypertensives. No recent refills on clonidine and lisinopril noted by pharmacy  -continue HCTZ and amlodipine -continue to monitor BP, add additional antihypertensive as needed  -systolic BP goal 0000000, per neuro  HLD Home meds include atorvastatin 20 mg. Most recent lipid panel notable for elevated triglycerides 164.  -atorvastatin 40 mg daily per neuro recs -pending lipid panel  CKD II Creatinine 1.25 on admission. Baseline 1.2-1.3 -continue to monitor BMP  Peripheral vascular disease   Hx of DVT Denies calf tenderness and no LE edema noted on physical exam. Home meds include Plavix due to history of DVT. Patient reports that he has been taking plavix intermittently since the holidays. DVT in 2014, s/p left popliteal and tibial embolectomy followed by left external iliac artery angioplasty and stenting. Also had 4 compartment fasciotomy. Per [facility] notes, patient discharged on plavix x 6 weeks and consideration to transition back to ASA. No clear etiology worked up per chart review.  -continue plavix and start ASA   Obesity  BMI 54.  -diet and exercise counseling outpatient   FEN/GI: NPO Prophylaxis: Lovenox  Disposition: admit to [facility], attending Dr. Owens Shark   History of Present Illness:  Carlos Phillips is a 55 y.o. male presenting with one day history of diplopia that started when he was driving from work. He was able to get home and states that he went to sleep, but then his symptoms continued to persist this morning which is when he decided  to come to the ED. Denies blurry vision or  scotoma. Denies history of seizures, head injury or LOC. States that this has never occurred before. Denies headache. Endorses regular compliance with his antihypertensives until 10 days ago when he started taking it intermittently since being wrapped up with the holidays. Denies compliance with metformin although prescribed 500 mg bid, has not seen an ophthalmologist in at least 3 years along with poor diabetes control. Has not seen his PCP since Jan 2021.   Review Of Systems: Per HPI with the following additions:   Review of Systems  Constitutional: Negative for activity change, fatigue and fever.  HENT: Negative for hearing loss.   Eyes: Positive for visual disturbance. Negative for pain and discharge.  Respiratory: Negative for cough and shortness of breath.   Cardiovascular: Negative for chest pain and leg swelling.  Gastrointestinal: Negative for abdominal pain, nausea and vomiting.  Skin: Negative for rash.  Neurological: Negative for dizziness, seizures, syncope, facial asymmetry, weakness, numbness and headaches.  Psychiatric/Behavioral: Negative for agitation and confusion.     Patient Active Problem List   Diagnosis Date Noted  . CVA (cerebral vascular accident) ([place]) 03/24/2020  . Hyperlipidemia 12/31/2016  . PVD (peripheral vascular disease) ([place]) 01/29/2016  . Diverticulitis 01/28/2016  . Special screening for malignant neoplasms, colon   . Type 2 diabetes mellitus with diabetic dermatitis, without long-term current use of insulin ([place]) 05/03/2015  . History of deep vein thrombosis (DVT) of lower extremity 03/07/2013  . Ischemia of extremity 02/25/2013  . HTN (hypertension) 05/07/2011  . Erectile dysfunction 05/07/2011  . Obesity 05/07/2011    Past Medical History: Past Medical History:  Diagnosis Date  . Diabetes mellitus without complication ([place])   . DVT (deep venous thrombosis) ([place]) 02/2013  . Erectile dysfunction   . Hyperlipidemia   . Hypertension   .  Obesity    BMI >52  . PVD (peripheral vascular disease) ([place])     Past Surgical History: Past Surgical History:  Procedure Laterality Date  . ABDOMINAL AORTAGRAM N/A 02/28/2013   Procedure: ABDOMINAL Maxcine Ham;  Surgeon: Conrad Vernon, MD;  Location: [facility] CATH LAB;  Service: Cardiovascular;  Laterality: N/A;  . COLONOSCOPY WITH PROPOFOL N/A 12/25/2015   Procedure: COLONOSCOPY WITH PROPOFOL;  Surgeon: Manus Gunning, MD;  Location[facility] ENDOSCOPY;  Service: Gastroenterology;  Laterality: N/A;  . EMBOLECTOMY Left 02/25/2013   Procedure: Left Popliteal EMBOLECTOMY Poss fasciotomy;  Surgeon: Elam Dutch, MD;  Location: [facility] OR;  Service: Vascular;  Laterality: Left;  Left poplital and Tibial embolectomy with four compartment Fasciotomy with vein patch angioplasty left popliteal artery.    Social History: Social History   Tobacco Use  . Smoking status: Former Smoker    Packs/day: 1.00    Years: 30.00    Pack years: 30.00    Types: Cigarettes    Quit date: 02/25/2013    Years since quitting: 7.0  . Smokeless tobacco: Never Used  Vaping Use  . Vaping Use: Never used  Substance Use Topics  . Alcohol use: Yes    Alcohol/week: 4.0 standard drinks    Types: 4 Standard drinks or equivalent per week    Comment: social- rare  . Drug use: No   Additional social history:  Please also refer to relevant sections of EMR.  Family History: Family History  Problem Relation Age of Onset  . Diabetes Mother   . Heart disease Father   . Asthma Son   . Colon cancer Neg Hx  Allergies and Medications: Allergies  Allergen Reactions  . Morphine And Related Nausea And Vomiting   No current facility-administered medications on file prior to encounter.   Current Outpatient Medications on File Prior to Encounter  Medication Sig Dispense Refill  . amLODipine (NORVASC) 10 MG tablet TAKE 1 TABLET BY MOUTH EVERY DAY (Patient taking differently: Take 10 mg by mouth daily.) 30 tablet 1  .  atorvastatin (LIPITOR) 20 MG tablet TAKE 1 TABLET BY MOUTH EVERY MORNING (Patient taking differently: Take 20 mg by mouth daily.) 30 tablet 2  . cloNIDine (CATAPRES) 0.1 MG tablet TAKE ONE TABLET BY MOUTH TWICE DAILY (Patient taking differently: Take 0.1 mg by mouth 2 (two) times daily.) 180 tablet 0  . clopidogrel (PLAVIX) 75 MG tablet Take 1 tablet (75 mg total) by mouth daily with breakfast. 90 tablet 1  . hydrochlorothiazide (HYDRODIURIL) 25 MG tablet TAKE 1 TABLET BY MOUTH EVERY DAY (Patient taking differently: Take 25 mg by mouth daily.) 30 tablet 1  . lisinopril (ZESTRIL) 5 MG tablet Take 1 tablet (5 mg total) by mouth daily. 90 tablet 1  . Multiple Vitamins-Minerals (MULTIVITAMIN WITH MINERALS) tablet Take 1 tablet by mouth daily.    . sildenafil (VIAGRA) 100 MG tablet Take 0.5-1 tablets (50-100 mg total) by mouth daily as needed for erectile dysfunction. 30 tablet 0  . Lancet Device MISC To test blood sugar once per day. Dx 250.00.  Brand per insurance coverage. 100 each 6  . metFORMIN (GLUCOPHAGE) 500 MG tablet Take 1 tablet (500 mg total) by mouth 2 (two) times daily with a meal. (Patient not taking: Reported on 03/24/2020) 180 tablet 1    Objective: BP (!) 134/91   Pulse 66   Temp 98.3 F (36.8 C) (Oral)   Resp 15   Ht 6' (1.829 m)   Wt (!) 181.4 kg   SpO2 99%   BMI 54.25 kg/m  Exam: General: Patient sitting upright comfortably in bed, in no acute distress. Eyes: PERRLA Neck: non-tender thyroid, no lymphadenopathy  Cardiovascular: RRR, no murmurs or gallops auscultated  Respiratory: lungs clear to auscultation bilaterally, breathing comfortably on room air  Gastrointestinal: soft, nontender, presence of active bowel sounds Ext: radial and distal pulses strong and equal bilaterally, no LE edema noted bilaterally Derm: skin warm and dry to touch, no rash or lesions noted Neuro: AOx4, ptosis noted of right eye, no nystagmus noted, difficulty with adduction of the right  eye,remaining cranial nerves grossly intact. Facial symmetry noted without facial droop. No pronator drift noted. 5/5 UE and LE strength bilaterally, gross sensation intact bilaterally. Speech clear and coherent. Follows all commands appropriately.  Psych: mood appropriate  Labs and Imaging: CBC BMET  Recent Labs  Lab 03/24/20 1215  WBC 8.8  HGB 15.3  HCT 47.8  PLT 241   Recent Labs  Lab 03/24/20 1215  NA 139  K 3.6  CL 104  CO2 26  BUN 11  CREATININE 1.25*  GLUCOSE 121*  CALCIUM 9.1     EKG: normal sinus rhythm without T wave abnormalities   MR Brain W and Wo Contrast  Result Date: 03/24/2020 CLINICAL DATA:  Weak right eye adduction. Opthalmoplegia. Diploplia. History of diabetes, DVT, hyperlipidemia, hypertension, obesity, peripheral vascular disease. EXAM: MRI HEAD WITHOUT AND WITH CONTRAST TECHNIQUE: Multiplanar, multiecho pulse sequences of the brain and surrounding structures were obtained without and with intravenous contrast. CONTRAST:  70mL GADAVIST GADOBUTROL 1 MMOL/ML IV SOLN COMPARISON:  CT head June 27, 2001. FINDINGS: Brain: Small focus  of restricted diffusion in the midbrain anterior to the cerebral aqueduct at the level of the superior colliculus. No associated enhancement. Scattered T2/FLAIR hyperintensities within the white matter, which are advanced in number for age. No abnormal enhancement. Small remote lacunar infarct in the right thalamus. Vascular: Major arterial flow voids are maintained at the skull base. No acute hemorrhage. No hydrocephalus. No mass lesion. No abnormal enhancement. Skull and upper cervical spine: Normal marrow signal. Sinuses/Orbits: Mild scattered paranasal sinus mucosal thickening without air-fluid levels. Unremarkable orbits. Other: Trace bilateral mastoid effusions. IMPRESSION: 1. Small acute infarct in the midbrain, in the expected region of the right oculomotor nucleus. No substantial edema or mass effect. 2. Age advanced T2/FLAIR  hyperintensities within the white matter, most likely related to chronic microvascular ischemic disease given the patient's reported risk factors. Additional differential considerations include prior demyelination, trauma, inflammation, or migraines. 3. Small remote lacunar infarct in the right thalamus. Electronically Signed   By: Feliberto Harts MD   On: 03/24/2020 13:16    Reece Leader, DO 03/24/2020, 2:58 PM PGY-1, [facility] FPTS Intern pager: [phone], text pages welcome  FPTS Upper-Level Resident Addendum I have independently interviewed and examined the patient. I have discussed the above with the original author and agree with their documentation. My edits for correction/addition/clarification are in - dark green. Please see also any attending notes.  FPTS Service pager: [phone] (text pages welcome through [facility])  Melene Plan, M.D.  PGY-3 03/24/2020 7:40 PM

## 2020-03-24 NOTE — Consult Note (Signed)
Referring Physician: Dr. Owens Shark    Chief Complaint: Acute onset of diplopia  HPI: Carlos Phillips is an 54 y.o. male with a PMHx of supermorbid obesity, DVT, HLD, PVD, DM and HTN presenting to the ED for evaluation of acute binocular diplopia. 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 He closed one eye and noticed that he could see again, so he continued his rounds with one eye closed and then went home. He thought that the symptoms were from his HTN, so he decided to sleep it off. On awakening, his diplopia was still present, so he decided to come to Urgent Care for evaluation. At Urgent Care, his BP was 167/108. Vision was 20/100 OD, 20/15 OS. He was sent to the [facility] ED for further evaluation.   He denies headache, facial droop, dysphagia, dysarthria, confusion, aphasia, limb weakness or limb numbness.   Home medications include Plavix.   LSN: Yesterday while at work tPA Given: No: Out of time window  Past Medical History:  Diagnosis Date  . Diabetes mellitus without complication ([place])   . DVT (deep venous thrombosis) ([place]) 02/2013  . Erectile dysfunction   . Hyperlipidemia   . Hypertension   . Obesity    BMI >52  . PVD (peripheral vascular disease) ([place])     Past Surgical History:  Procedure Laterality Date  . ABDOMINAL AORTAGRAM N/A 02/28/2013   Procedure: ABDOMINAL Maxcine Ham;  Surgeon: Conrad Jena, MD;  Location: [facility] CATH LAB;  Service: Cardiovascular;  Laterality: N/A;  . COLONOSCOPY WITH PROPOFOL N/A 12/25/2015   Procedure: COLONOSCOPY WITH PROPOFOL;  Surgeon: Manus Gunning, MD;  Location: [facility] ENDOSCOPY;  Service: Gastroenterology;  Laterality: N/A;  . EMBOLECTOMY Left 02/25/2013   Procedure: Left Popliteal EMBOLECTOMY Poss fasciotomy;  Surgeon: Elam Dutch, MD;  Location: [facility] OR;  Service: Vascular;  Laterality: Left;  Left poplital and Tibial embolectomy with four compartment  Fasciotomy with vein patch angioplasty left popliteal artery.    Family History  Problem Relation Age of Onset  . Diabetes Mother   . Heart disease Father   . Asthma Son   . Colon cancer Neg Hx    Social History:  reports that he quit smoking about 7 years ago. His smoking use included cigarettes. He has a 30.00 pack-year smoking history. He has never used smokeless tobacco. He reports current alcohol use of about 4.0 standard drinks of alcohol per week. He reports that he does not use drugs.  Allergies:  Allergies  Allergen Reactions  . Morphine And Related Nausea And Vomiting    Home Medications:  No current facility-administered medications on file prior to encounter.   Current Outpatient Medications on File Prior to Encounter  Medication Sig Dispense Refill  . amLODipine (NORVASC) 10 MG tablet TAKE 1 TABLET BY MOUTH EVERY DAY (Patient taking differently: Take 10 mg by mouth daily.) 30 tablet 1  . atorvastatin (LIPITOR) 20 MG tablet TAKE 1 TABLET BY MOUTH EVERY MORNING (Patient taking differently: Take 20 mg by mouth daily.) 30 tablet 2  . cloNIDine (CATAPRES) 0.1 MG tablet TAKE ONE TABLET BY MOUTH TWICE DAILY (Patient taking differently: Take 0.1 mg by mouth 2 (two) times daily.) 180 tablet 0  . clopidogrel (PLAVIX) 75 MG tablet Take 1 tablet (75 mg total) by mouth daily with breakfast. 90 tablet 1  . hydrochlorothiazide (HYDRODIURIL) 25 MG tablet TAKE 1 TABLET BY MOUTH EVERY DAY (  Patient taking differently: Take 25 mg by mouth daily.) 30 tablet 1  . lisinopril (ZESTRIL) 5 MG tablet Take 1 tablet (5 mg total) by mouth daily. 90 tablet 1  . Multiple Vitamins-Minerals (MULTIVITAMIN WITH MINERALS) tablet Take 1 tablet by mouth daily.    . sildenafil (VIAGRA) 100 MG tablet Take 0.5-1 tablets (50-100 mg total) by mouth daily as needed for erectile dysfunction. 30 tablet 0  . Lancet Device MISC To test blood sugar once per day. Dx 250.00.  Brand per insurance coverage. 100 each 6  .  metFORMIN (GLUCOPHAGE) 500 MG tablet Take 1 tablet (500 mg total) by mouth 2 (two) times daily with a meal. (Patient not taking: Reported on 03/24/2020) 180 tablet 1    ROS: As per HPI. Comprehensive ROS otherwise negative.   Physical Examination: Blood pressure (!) 134/91, pulse 66, temperature 98.3 F (36.8 C), temperature source Oral, resp. rate 15, height 6' (1.829 m), weight (!) 181.4 kg, SpO2 99 %.  HEENT: [place]/AT Lungs: Respirations unlabored Ext: Chronic trophic changes distal BLE  Neurologic Examination: Mental Status: Awake and alert, fully oriented, thought content appropriate.  Speech fluent without evidence of aphasia.  Able to follow all commands without difficulty. Cranial Nerves: II:  Visual fields grossly normal. PERRL.  III,IV, VI: Mild right ptosis. Right eye lags left when gazing to the left but not when gazing to the right. Has vertical diplopia with upward gaze and gaze to the left. No nystagmus. Exotropia noted when gazing straight ahead.  V,VII: Smile symmetric, facial temp sensation equal bilaterally VIII: hearing intact to voice IX,X: No hypophonia XI: Head is midline XII: midline tongue extension  Motor: Right : Upper extremity   5/5    Left:     Upper extremity   5/5  Lower extremity   5/5     Lower extremity   5/5 No pronator drift.  Sensory: Temp and light touch intact throughout, bilaterally. No extinction to DSS.  Deep Tendon Reflexes:  2+ bilateral upper extremities.  Hypoactive patellar reflexes.  Absent achilles reflexes.  Plantars: Right: downgoing   Left: downgoing Cerebellar: No ataxia with FNF bilaterally  Gait: Deferred  Results for orders placed or performed during the hospital encounter of 03/24/20 (from the past 48 hour(s))  Protime-INR     Status: None   Collection Time: 03/24/20 12:15 PM  Result Value Ref Range   Prothrombin Time 12.8 11.4 - 15.2 seconds   INR 1.0 0.8 - 1.2    Comment: (NOTE) INR goal varies based on device and  disease states. Performed at [facility], [address], [place], [place] [address]   APTT     Status: None   Collection Time: 03/24/20 12:15 PM  Result Value Ref Range   aPTT 30 24 - 36 seconds    Comment: Performed at [facility], [address], [place], [place] [address]  CBC     Status: None   Collection Time: 03/24/20 12:15 PM  Result Value Ref Range   WBC 8.8 4.0 - 10.5 K/uL   RBC 5.30 4.22 - 5.81 MIL/uL   Hemoglobin 15.3 13.0 - 17.0 g/dL   HCT 65.0 35.4 - 65.6 %   MCV 90.2 80.0 - 100.0 fL   MCH 28.9 26.0 - 34.0 pg   MCHC 32.0 30.0 - 36.0 g/dL   RDW 81.2 75.1 - 70.0 %   Platelets 241 150 - 400 K/uL   nRBC 0.0 0.0 - 0.2 %  Comment: Performed at [facility], [address], [place], [place] [address]  Differential     Status: Abnormal   Collection Time: 03/24/20 12:15 PM  Result Value Ref Range   Neutrophils Relative % 54 %   Neutro Abs 4.8 1.7 - 7.7 K/uL   Lymphocytes Relative 26 %   Lymphs Abs 2.3 0.7 - 4.0 K/uL   Monocytes Relative 8 %   Monocytes Absolute 0.7 0.1 - 1.0 K/uL   Eosinophils Relative 10 %   Eosinophils Absolute 0.9 (H) 0.0 - 0.5 K/uL   Basophils Relative 1 %   Basophils Absolute 0.1 0.0 - 0.1 K/uL   Immature Granulocytes 1 %   Abs Immature Granulocytes 0.04 0.00 - 0.07 K/uL    Comment: Performed at [facility], [address] [place], [place] [address]  Comprehensive metabolic panel     Status: Abnormal   Collection Time: 03/24/20 12:15 PM  Result Value Ref Range   Sodium 139 135 - 145 mmol/L   Potassium 3.6 3.5 - 5.1 mmol/L   Chloride 104 98 - 111 mmol/L   CO2 26 22 - 32 mmol/L   Glucose, Bld 121 (H) 70 - 99 mg/dL    Comment: Glucose reference range applies only to samples taken after fasting for at least 8 hours.   BUN 11 6 - 20 mg/dL   Creatinine, Ser 7.42 (H) 0.61 - 1.24 mg/dL   Calcium 9.1 8.9 - 59.5 mg/dL   Total Protein 6.1 (L) 6.5 - 8.1 g/dL   Albumin 3.4 (L) 3.5 - 5.0 g/dL   AST 20 15 - 41 U/L   ALT  19 0 - 44 U/L   Alkaline Phosphatase 64 38 - 126 U/L   Total Bilirubin 0.3 0.3 - 1.2 mg/dL   GFR, Estimated >63 >87 mL/min    Comment: (NOTE) Calculated using the CKD-EPI Creatinine Equation (2021)    Anion gap 9 5 - 15    Comment: Performed at [facility], [address], [place], [place] [address]   MR Brain W and Wo Contrast  Result Date: 03/24/2020 CLINICAL DATA:  Weak right eye adduction. Opthalmoplegia. Diploplia. History of diabetes, DVT, hyperlipidemia, hypertension, obesity, peripheral vascular disease. EXAM: MRI HEAD WITHOUT AND WITH CONTRAST TECHNIQUE: Multiplanar, multiecho pulse sequences of the brain and surrounding structures were obtained without and with intravenous contrast. CONTRAST:  53mL GADAVIST GADOBUTROL 1 MMOL/ML IV SOLN COMPARISON:  CT head June 27, 2001. FINDINGS: Brain: Small focus of restricted diffusion in the midbrain anterior to the cerebral aqueduct at the level of the superior colliculus. No associated enhancement. Scattered T2/FLAIR hyperintensities within the white matter, which are advanced in number for age. No abnormal enhancement. Small remote lacunar infarct in the right thalamus. Vascular: Major arterial flow voids are maintained at the skull base. No acute hemorrhage. No hydrocephalus. No mass lesion. No abnormal enhancement. Skull and upper cervical spine: Normal marrow signal. Sinuses/Orbits: Mild scattered paranasal sinus mucosal thickening without air-fluid levels. Unremarkable orbits. Other: Trace bilateral mastoid effusions. IMPRESSION: 1. Small acute infarct in the midbrain, in the expected region of the right oculomotor nucleus. No substantial edema or mass effect. 2. Age advanced T2/FLAIR hyperintensities within the white matter, most likely related to chronic microvascular ischemic disease given the patient's reported risk factors. Additional differential considerations include prior demyelination, trauma, inflammation, or migraines. 3. Small  remote lacunar infarct in the right thalamus. Electronically Signed   By: Feliberto Harts MD   On: 03/24/2020  13:16    Assessment: 54 y.o. male presenting with acute binocular diplopia. MRI reveals small right oculomotor nucleus ischemic infarction.  1. Exam reveals findings consistent with a partial infarction of the right CN3 nucleus.  2. MRI brain: Small acute infarct in the midbrain, in the expected region of the right oculomotor nucleus. No substantial edema or mass effect. Age advanced T2/FLAIR hyperintensities within the white matter, most likely related to chronic microvascular ischemic disease. Small remote lacunar infarct in the right thalamus. 3. Stroke Risk Factors - Supermorbid obesity, DVT, HLD, PVD, DM and HTN  4. EKG: NSR  Recommendations: 1. HgbA1c, fasting lipid panel 2. CTA of head and neck 3. PT consult, OT consult, Speech consult 4. TTE 5. Carotid dopplers 6. Prophylactic therapy- Add ASA to Plavix 7. Increase atorvastatin to 40 mg po qd 8. Risk factor modification to include elimination of all sugary drinks as well as to start a BP diary 9. Telemetry monitoring 10. Frequent neuro checks 11. BP management. SBP goal of 120-140.    @Electronically  signed: Dr. Kerney Elbe  03/24/2020, 3:33 PM

## 2020-03-24 NOTE — ED Notes (Signed)
Report to DR Tracie Harrier  On Pt Sx's

## 2020-03-24 NOTE — ED Notes (Signed)
Patient is being discharged from the Urgent Care and sent to the Emergency Department via personal vehicle . Per Dr Tracie Harrier, patient is in need of higher level of care due to severe eye problem. Patient is aware and verbalizes understanding of plan of care.  Vitals:   03/24/20 0834  BP: (!) 167/100  Pulse: 87  Resp: 18  Temp: 98.6 F (37 C)  SpO2: 98%

## 2020-03-24 NOTE — ED Notes (Addendum)
Patient back from CT. Hospital bed provided for patient. Patient ambulatory with a steady gait to same.

## 2020-03-25 ENCOUNTER — Inpatient Hospital Stay (HOSPITAL_COMMUNITY): Payer: BC Managed Care – PPO

## 2020-03-25 DIAGNOSIS — I739 Peripheral vascular disease, unspecified: Secondary | ICD-10-CM | POA: Diagnosis not present

## 2020-03-25 DIAGNOSIS — H499 Unspecified paralytic strabismus: Secondary | ICD-10-CM | POA: Insufficient documentation

## 2020-03-25 DIAGNOSIS — E7800 Pure hypercholesterolemia, unspecified: Secondary | ICD-10-CM

## 2020-03-25 DIAGNOSIS — I639 Cerebral infarction, unspecified: Secondary | ICD-10-CM | POA: Diagnosis not present

## 2020-03-25 DIAGNOSIS — E11620 Type 2 diabetes mellitus with diabetic dermatitis: Secondary | ICD-10-CM

## 2020-03-25 DIAGNOSIS — H532 Diplopia: Secondary | ICD-10-CM | POA: Insufficient documentation

## 2020-03-25 DIAGNOSIS — I10 Essential (primary) hypertension: Secondary | ICD-10-CM | POA: Diagnosis not present

## 2020-03-25 DIAGNOSIS — I6389 Other cerebral infarction: Secondary | ICD-10-CM

## 2020-03-25 LAB — CBC WITH DIFFERENTIAL/PLATELET
Abs Immature Granulocytes: 0.04 K/uL (ref 0.00–0.07)
Basophils Absolute: 0 K/uL (ref 0.0–0.1)
Basophils Relative: 1 %
Eosinophils Absolute: 0.9 K/uL — ABNORMAL HIGH (ref 0.0–0.5)
Eosinophils Relative: 11 %
HCT: 47.9 % (ref 39.0–52.0)
Hemoglobin: 16.4 g/dL (ref 13.0–17.0)
Immature Granulocytes: 1 %
Lymphocytes Relative: 32 %
Lymphs Abs: 2.8 K/uL (ref 0.7–4.0)
MCH: 29.5 pg (ref 26.0–34.0)
MCHC: 34.2 g/dL (ref 30.0–36.0)
MCV: 86.3 fL (ref 80.0–100.0)
Monocytes Absolute: 0.6 K/uL (ref 0.1–1.0)
Monocytes Relative: 7 %
Neutro Abs: 4.2 K/uL (ref 1.7–7.7)
Neutrophils Relative %: 48 %
Platelets: 281 K/uL (ref 150–400)
RBC: 5.55 MIL/uL (ref 4.22–5.81)
RDW: 14.3 % (ref 11.5–15.5)
WBC: 8.6 K/uL (ref 4.0–10.5)
nRBC: 0 % (ref 0.0–0.2)

## 2020-03-25 LAB — CBC
HCT: 47.3 % (ref 39.0–52.0)
Hemoglobin: 15.7 g/dL (ref 13.0–17.0)
MCH: 29.8 pg (ref 26.0–34.0)
MCHC: 33.2 g/dL (ref 30.0–36.0)
MCV: 89.9 fL (ref 80.0–100.0)
Platelets: 235 10*3/uL (ref 150–400)
RBC: 5.26 MIL/uL (ref 4.22–5.81)
RDW: 14.6 % (ref 11.5–15.5)
WBC: 8.8 10*3/uL (ref 4.0–10.5)
nRBC: 0 % (ref 0.0–0.2)

## 2020-03-25 LAB — BASIC METABOLIC PANEL
Anion gap: 10 (ref 5–15)
BUN: 10 mg/dL (ref 6–20)
CO2: 24 mmol/L (ref 22–32)
Calcium: 8.9 mg/dL (ref 8.9–10.3)
Chloride: 104 mmol/L (ref 98–111)
Creatinine, Ser: 1.15 mg/dL (ref 0.61–1.24)
GFR, Estimated: >60 mL/min (ref >60)
Glucose, Bld: 115 mg/dL — ABNORMAL HIGH (ref 70–99)
Potassium: 3.8 mmol/L (ref 3.5–5.1)
Sodium: 138 mmol/L (ref 135–145)

## 2020-03-25 LAB — URINALYSIS, ROUTINE W REFLEX MICROSCOPIC
Bilirubin Urine: NEGATIVE
Glucose, UA: NEGATIVE mg/dL
Hgb urine dipstick: NEGATIVE
Ketones, ur: NEGATIVE mg/dL
Leukocytes,Ua: NEGATIVE
Nitrite: NEGATIVE
Protein, ur: NEGATIVE mg/dL
Specific Gravity, Urine: 1.014 (ref 1.005–1.030)
pH: 6 (ref 5.0–8.0)

## 2020-03-25 LAB — LIPID PANEL
Cholesterol: 160 mg/dL (ref 0–200)
HDL: 43 mg/dL (ref >40)
LDL Cholesterol: 93 mg/dL (ref 0–99)
Total CHOL/HDL Ratio: 3.7 RATIO
Triglycerides: 118 mg/dL (ref <150)
VLDL: 24 mg/dL (ref 0–40)

## 2020-03-25 LAB — ECHOCARDIOGRAM COMPLETE
Area-P 1/2: 2.91 cm2
Height: 72 in
S' Lateral: 2.7 cm
Weight: 6400 oz

## 2020-03-25 LAB — RAPID URINE DRUG SCREEN, HOSP PERFORMED
Amphetamines: NOT DETECTED
Barbiturates: NOT DETECTED
Benzodiazepines: NOT DETECTED
Cocaine: NOT DETECTED
Opiates: NOT DETECTED
Tetrahydrocannabinol: POSITIVE — AB

## 2020-03-25 LAB — HEMOGLOBIN A1C
Hgb A1c MFr Bld: 6.4 % — ABNORMAL HIGH (ref 4.8–5.6)
Mean Plasma Glucose: 136.98 mg/dL

## 2020-03-25 MED ORDER — PERFLUTREN LIPID MICROSPHERE
1.0000 mL | INTRAVENOUS | Status: AC | PRN
Start: 2020-03-25 — End: 2020-03-25
  Administered 2020-03-25: 3 mL via INTRAVENOUS
  Filled 2020-03-25: qty 10

## 2020-03-25 MED ORDER — METFORMIN HCL 500 MG PO TABS
500.0000 mg | ORAL_TABLET | Freq: Two times a day (BID) | ORAL | Status: DC
  Administered 2020-03-25 – 2020-03-26 (×2): 500 mg via ORAL
  Filled 2020-03-25 (×2): qty 1

## 2020-03-25 NOTE — ED Notes (Signed)
Pt transported to vascular ultrasound

## 2020-03-25 NOTE — ED Notes (Signed)
Patient transported to Ultrasound 

## 2020-03-25 NOTE — Progress Notes (Signed)
STROKE TEAM PROGRESS NOTE   INTERVAL HISTORY Wife at the bedside. Pt lying in bed, has glass occluder for diplopia. Pt stated that without glasses, he still has double vision, more vertical diplopia. When looking right and up direction, diplopia disappears, consistent with right CN IV palsy. Pt denies any smoking, or illicit drugs. But 5 drinks at weekend. Some snoring during sleep, but not bad per wife.   Vitals:   03/25/20 0400 03/25/20 0530 03/25/20 0830 03/25/20 0835  BP: 140/76 (!) 163/111  (!) 136/99  Pulse: 62 76  81  Resp: (!) 21 13  19   Temp:   98.3 F (36.8 C)   TempSrc:   Oral   SpO2: 96% 96%  94%  Weight:      Height:       CBC:  Recent Labs  Lab 03/24/20 1215 03/25/20 0414  WBC 8.8 8.8  NEUTROABS 4.8  --   HGB 15.3 15.7  HCT 47.8 47.3  MCV 90.2 89.9  PLT 241 [id number]   Basic Metabolic Panel:  Recent Labs  Lab 03/24/20 1215 03/25/20 0414  NA 139 138  K 3.6 3.8  CL 104 104  CO2 26 24  GLUCOSE 121* 115*  BUN 11 10  CREATININE 1.25* 1.15  CALCIUM 9.1 8.9   Lipid Panel:  Recent Labs  Lab 03/25/20 0414  CHOL 160  TRIG 118  HDL 43  CHOLHDL 3.7  VLDL 24  LDLCALC 93   HgbA1c:  Recent Labs  Lab 03/25/20 0414  HGBA1C 6.4*   Urine Drug Screen:  Recent Labs  Lab 03/25/20 0500  LABOPIA NONE DETECTED  COCAINSCRNUR NONE DETECTED  LABBENZ NONE DETECTED  AMPHETMU NONE DETECTED  THCU POSITIVE*  LABBARB NONE DETECTED    Alcohol Level No results for input(s): ETH in the last 168 hours.  IMAGING past 24 hours CT ANGIO HEAD W OR WO CONTRAST  Addendum Date: 03/24/2020   ADDENDUM REPORT: 03/24/2020 22:00 ADDENDUM: Normal CTA of the head. Electronically Signed   By: Ulyses Jarred M.D.   On: 03/24/2020 22:00   Result Date: 03/24/2020 CLINICAL DATA:  Right eye ophthalmoplegia.  Diplopia. EXAM: CT ANGIOGRAPHY HEAD TECHNIQUE: Multidetector CT imaging of the head was performed using the standard protocol during bolus administration of intravenous contrast.  Multiplanar CT image reconstructions and MIPs were obtained to evaluate the vascular anatomy. CONTRAST:  62mL OMNIPAQUE IOHEXOL 350 MG/ML SOLN COMPARISON:  Brain MRI 03/24/2020 FINDINGS: CT HEAD Brain: There is no mass, hemorrhage or extra-axial collection. The size and configuration of the ventricles and extra-axial CSF spaces are normal. The brain parenchyma is normal, without acute or chronic infarction. Vascular: No abnormal hyperdensity of the major intracranial arteries or dural venous sinuses. No intracranial atherosclerosis. Skull: The visualized skull base, calvarium and extracranial soft tissues are normal. Sinuses/Orbits: No fluid levels or advanced mucosal thickening of the visualized paranasal sinuses. No mastoid or middle ear effusion. The orbits are normal. CTA HEAD POSTERIOR CIRCULATION: --Vertebral arteries: Normal --Inferior cerebellar arteries: Normal. --Basilar artery: Normal. --Superior cerebellar arteries: Normal. --Posterior cerebral arteries: Normal. ANTERIOR CIRCULATION: --Intracranial internal carotid arteries: Normal. --Anterior cerebral arteries (ACA): Normal. --Middle cerebral arteries (MCA): Normal. ANATOMIC VARIANTS: None Electronically Signed: By: Ulyses Jarred M.D. On: 03/24/2020 21:03   MR Brain W and Wo Contrast  Result Date: 03/24/2020 CLINICAL DATA:  Weak right eye adduction. Opthalmoplegia. Diploplia. History of diabetes, DVT, hyperlipidemia, hypertension, obesity, peripheral vascular disease. EXAM: MRI HEAD WITHOUT AND WITH CONTRAST TECHNIQUE: Multiplanar, multiecho pulse sequences of the  brain and surrounding structures were obtained without and with intravenous contrast. CONTRAST:  77mL GADAVIST GADOBUTROL 1 MMOL/ML IV SOLN COMPARISON:  CT head June 27, 2001. FINDINGS: Brain: Small focus of restricted diffusion in the midbrain anterior to the cerebral aqueduct at the level of the superior colliculus. No associated enhancement. Scattered T2/FLAIR hyperintensities within  the white matter, which are advanced in number for age. No abnormal enhancement. Small remote lacunar infarct in the right thalamus. Vascular: Major arterial flow voids are maintained at the skull base. No acute hemorrhage. No hydrocephalus. No mass lesion. No abnormal enhancement. Skull and upper cervical spine: Normal marrow signal. Sinuses/Orbits: Mild scattered paranasal sinus mucosal thickening without air-fluid levels. Unremarkable orbits. Other: Trace bilateral mastoid effusions. IMPRESSION: 1. Small acute infarct in the midbrain, in the expected region of the right oculomotor nucleus. No substantial edema or mass effect. 2. Age advanced T2/FLAIR hyperintensities within the white matter, most likely related to chronic microvascular ischemic disease given the patient's reported risk factors. Additional differential considerations include prior demyelination, trauma, inflammation, or migraines. 3. Small remote lacunar infarct in the right thalamus. Electronically Signed   By: Margaretha Sheffield MD   On: 03/24/2020 13:16    PHYSICAL EXAM  Temp:  [98.3 F (36.8 C)-99.2 F (37.3 C)] 99.2 F (37.3 C) (12/29 1359) Pulse Rate:  [62-82] 68 (12/29 1359) Resp:  [12-30] 16 (12/29 1359) BP: (113-167)/(69-111) 158/100 (12/29 1359) SpO2:  [92 %-100 %] 99 % (12/29 1359)  General - Well nourished, well developed, in no apparent distress.  Ophthalmologic - fundi not visualized due to noncooperation.  Cardiovascular - Regular rhythm and rate.  Mental Status -  Level of arousal and orientation to time, place, and person were intact. Language including expression, naming, repetition, comprehension was assessed and found intact. Fund of Knowledge was assessed and was intact.  Cranial Nerves II - XII - II - Visual field intact OU. III, IV, VI - Extraocular movements showed right eye difficulty with downward and inward gaze, consistent with right CN IV palsy. V - Facial sensation intact bilaterally. VII -  Facial movement intact bilaterally. VIII - Hearing & vestibular intact bilaterally. X - Palate elevates symmetrically. XI - Chin turning & shoulder shrug intact bilaterally. XII - Tongue protrusion intact.  Motor Strength - The patient's strength was normal in all extremities and pronator drift was absent.  Bulk was normal and fasciculations were absent.   Motor Tone - Muscle tone was assessed at the neck and appendages and was normal.  Reflexes - The patient's reflexes were symmetrical in all extremities and he had no pathological reflexes.  Sensory - Light touch, temperature/pinprick were assessed and were symmetrical.    Coordination - The patient had normal movements in the hands and feet with no ataxia or dysmetria.  Tremor was absent.  Gait and Station - deferred.   ASSESSMENT/PLAN Mr. TAAHIR MICHIELS is a 55 y.o. male with history of supermorbid obesity, DVT, HLD, PVD, DM and HTN presenting with binocular diplopia.   Stroke: Small midbrain infarct secondary to small vessel disease source  MRI  Small midbrain infarct. Chronic small R thalamic lacune.  CTA head Unremarkable   Carotid Doppler  B ICA 1-39% stenosis, VAs antegrade   2D Echo EF 60 to 65%  LDL 93  HgbA1c 6.4  VTE prophylaxis - Lovenox 90 mg sq daily   clopidogrel 75 mg daily prior to admission, now on aspirin 81 mg daily and clopidogrel 75 mg daily. Continue DAPT x 3  weeks then Plavix alone.   Therapy recommendations:  outpt OT  Disposition:  pending   Recommend evaluation for Obstructive sleep apnea an an OP d/t body habitus. Can be arranged during post-hospitalization f/u w/ [facility]    Hypertension  Stable . Permissive hypertension (OK if < 220/120) but gradually normalize in 2-3 days . Long-term BP goal normotensive  Hyperlipidemia  Home meds:  lipitor 20  Now on lipitor 40  LDL 93, goal < 70  Continue statin at discharge  Diabetes type II Controlled  HgbA1c  6.4, goal < 7.0  CBGs  SSI  PCP follow-up  Other Stroke Risk Factors  Former Cigarette smoker, quit 7 yrs ago  ETOH use, advised to drink no more than 2 drink(s) a day  Substance abuse - UDS:  THC POSITIVE, Patient advised to stop using due to stroke risk.  Morbid Obesity, Body mass index is 54.25 kg/m., BMI >/= 30 associated with increased stroke risk, recommend weight loss, diet and exercise as appropriate   PVD  Hx DVT  Other Active Problems  CKD stage II  [facility] day # 1  Neurology will sign off. Please call with questions. Pt will follow up with stroke clinic NP at [facility] in about 4 weeks. Thanks for the consult.  Marvel Plan, MD PhD Stroke Neurology 03/25/2020 6:42 PM    To contact Stroke Continuity provider, please refer to [email or web address]. After hours, contact General Neurology

## 2020-03-25 NOTE — ED Notes (Signed)
Physician at bedside with patient

## 2020-03-25 NOTE — Progress Notes (Signed)
Family Medicine Teaching Service Daily Progress Note Intern Pager: [phone]  Patient name: Carlos Phillips Medical record number: [id number] Date of birth: 1965-04-26 Age: 54 y.o. Gender: male  Primary Care Provider: Wendie Agreste, MD Consultants: Neurology  Code Status: Full Code   Pt Overview and Major Events to Date:  Hospital Day: 2 03/24/2020: admitted for CVA   Assessment and Plan: Carlos Phillips is a 54 y.o. male who presented w/ binocular diplopia found to have acute CVA of right oculomotor nucleus. PMHx s/f HTN, PVD, HLD, NIDDM, obesity.  Acute CVA  Patient's neuro exam unchanged this morning.  He continues to have binocular diplopia.  No other acute neurologic deficits this morning.  CTA head without any abnormalities.  Echocardiogram and vascular carotid ultrasound pending.  Neurology consulted, appreciate recommendations.   Chronic problem management as below   Continue DAPT  PT, OT for eval  Follow-up remaining studies  Type II DM A1C 6.4%. Patient reports non adherence to his metformin.  . We will start Metformin 500 mg . Encourage dietary modifications  HTN  Home medications include amlodipine, hydrochlorothiazide, lisinopril, clonidine.  Unclear if patient has been taking lisinopril and clonidine at home.  Current neurology recommendations for blood pressure systolic goal 0000000.  Continue home amlodipine and hydrochlorothiazide  Consider adding lisinopril for further blood pressure control if needed  CKD 2 Creatinine stable  Continue to monitor BMP  Avoid nephrotoxic agents  Peripheral vascular disease History of popliteal and tibial embolectomy followed by external iliac artery angioplasty and stenting.  Patient has been on Plavix.  DAPT as above   Obesity  BMI 54.   Diet and exercise counseling outpatient   Eosinophilia  No work up in the past on chart review. There are thrombotic complications that can occur with  hypereosinophilic syndromes. Recommended work up if AEC > 1500 x 2.   Obtain absolute eos count   Likely further work up outpatient   FEN/GI:  . Fluids: none  . Electrolytes: replete PRN   . Nutrition: Heart Healthy   VTE prophylaxis: Lovenox 40 (CrCl>30)  Status is: Inpatient  Remains inpatient appropriate because:Ongoing diagnostic testing needed not appropriate for outpatient work up   Dispo: The patient is from: Home              Anticipated d/c is to: Home              Anticipated d/c date is: 2 days              Patient currently is not medically stable to d/c.   Subjective:  NAEO.  Continues to have blurriness with binocular vision.  Objective: Temp:  [98.3 F (36.8 C)-98.9 F (37.2 C)] 98.6 F (37 C) (12/28 1630) Pulse Rate:  [62-93] 76 (12/29 0530) Resp:  [12-30] 13 (12/29 0530) BP: (113-167)/(69-111) 163/111 (12/29 0530) SpO2:  [92 %-100 %] 96 % (12/29 0530) Weight:  [181.4 kg] 181.4 kg (12/28 0938) Intake/Output    None       Physical Exam: General: NAD, non-toxic, well-appearing, lying comfortably in bed HEENT: [place]/AT. PERRLA. EOMI.  Cardiovascular: RRR, normal S1, S2. B/L 2+ RP.  No BLEE Respiratory: CTAB. No IWOB.  Abdomen: + BS. NT, ND, soft to palpation.  Extremities: Warm and well perfused. Moving spontaneously.  Integumentary: No obvious rashes, lesions, trauma on general exam. Neuro: A & O x4.  Cranial nerves intact with exception of abnormal extraocular movements unchanged from exam yesterday.  No other focal neurologic deficits  appreciated.  Laboratory: I have personally read and reviewed all labs and imaging studies.  CBC: Recent Labs  Lab 03/24/20 1215 03/25/20 0414  WBC 8.8 8.8  NEUTROABS 4.8  --   HGB 15.3 15.7  HCT 47.8 47.3  MCV 90.2 89.9  PLT 241 235   CMP: Recent Labs  Lab 03/24/20 1215 03/25/20 0414  NA 139 138  K 3.6 3.8  CL 104 104  CO2 26 24  GLUCOSE 121* 115*  BUN 11 10  CREATININE 1.25* 1.15  CALCIUM 9.1 8.9   ALBUMIN 3.4*  --    CBG: No results for input(s): GLUCAP in the last 168 hours.   Micro: Covid Negative  Imaging/Diagnostic Tests: CT ANGIO HEAD W OR WO CONTRAST  Addendum Date: 03/24/2020   ADDENDUM REPORT: 03/24/2020 22:00 ADDENDUM: Normal CTA of the head. Electronically Signed   By: Deatra Robinson M.D.   On: 03/24/2020 22:00   Result Date: 03/24/2020 CLINICAL DATA:  Right eye ophthalmoplegia.  Diplopia. EXAM: CT ANGIOGRAPHY HEAD TECHNIQUE: Multidetector CT imaging of the head was performed using the standard protocol during bolus administration of intravenous contrast. Multiplanar CT image reconstructions and MIPs were obtained to evaluate the vascular anatomy. CONTRAST:  56mL OMNIPAQUE IOHEXOL 350 MG/ML SOLN COMPARISON:  Brain MRI 03/24/2020 FINDINGS: CT HEAD Brain: There is no mass, hemorrhage or extra-axial collection. The size and configuration of the ventricles and extra-axial CSF spaces are normal. The brain parenchyma is normal, without acute or chronic infarction. Vascular: No abnormal hyperdensity of the major intracranial arteries or dural venous sinuses. No intracranial atherosclerosis. Skull: The visualized skull base, calvarium and extracranial soft tissues are normal. Sinuses/Orbits: No fluid levels or advanced mucosal thickening of the visualized paranasal sinuses. No mastoid or middle ear effusion. The orbits are normal. CTA HEAD POSTERIOR CIRCULATION: --Vertebral arteries: Normal --Inferior cerebellar arteries: Normal. --Basilar artery: Normal. --Superior cerebellar arteries: Normal. --Posterior cerebral arteries: Normal. ANTERIOR CIRCULATION: --Intracranial internal carotid arteries: Normal. --Anterior cerebral arteries (ACA): Normal. --Middle cerebral arteries (MCA): Normal. ANATOMIC VARIANTS: None Electronically Signed: By: Deatra Robinson M.D. On: 03/24/2020 21:03   MR Brain W and Wo Contrast  Result Date: 03/24/2020 CLINICAL DATA:  Weak right eye adduction. Opthalmoplegia.  Diploplia. History of diabetes, DVT, hyperlipidemia, hypertension, obesity, peripheral vascular disease. EXAM: MRI HEAD WITHOUT AND WITH CONTRAST TECHNIQUE: Multiplanar, multiecho pulse sequences of the brain and surrounding structures were obtained without and with intravenous contrast. CONTRAST:  73mL GADAVIST GADOBUTROL 1 MMOL/ML IV SOLN COMPARISON:  CT head June 27, 2001. FINDINGS: Brain: Small focus of restricted diffusion in the midbrain anterior to the cerebral aqueduct at the level of the superior colliculus. No associated enhancement. Scattered T2/FLAIR hyperintensities within the white matter, which are advanced in number for age. No abnormal enhancement. Small remote lacunar infarct in the right thalamus. Vascular: Major arterial flow voids are maintained at the skull base. No acute hemorrhage. No hydrocephalus. No mass lesion. No abnormal enhancement. Skull and upper cervical spine: Normal marrow signal. Sinuses/Orbits: Mild scattered paranasal sinus mucosal thickening without air-fluid levels. Unremarkable orbits. Other: Trace bilateral mastoid effusions. IMPRESSION: 1. Small acute infarct in the midbrain, in the expected region of the right oculomotor nucleus. No substantial edema or mass effect. 2. Age advanced T2/FLAIR hyperintensities within the white matter, most likely related to chronic microvascular ischemic disease given the patient's reported risk factors. Additional differential considerations include prior demyelination, trauma, inflammation, or migraines. 3. Small remote lacunar infarct in the right thalamus. Electronically Signed   By: Thornton Dales  Ronnald Ramp MD   On: 03/24/2020 13:16    EKG 03/24/20 @ 1953 : NSR    Procedures:   Procedure Orders     ED EKG     EKG 12-Lead     EKG 12-Lead     EKG 12-Lead     ECHOCARDIOGRAM COMPLETE  Wilber Oliphant, MD 03/25/2020, 8:05 AM PGY-3, [place] Intern pager: [phone], text pages welcome

## 2020-03-25 NOTE — CV Procedure (Signed)
Carotid Duplex has been completed.  Results can be found under chart review under CV PROC. 03/25/2020 12:47 PM Taylon Coole RVT, RDMS

## 2020-03-25 NOTE — Progress Notes (Signed)
  Echocardiogram 2D Echocardiogram has been performed.  Carlos Phillips 03/25/2020, 12:00 PM

## 2020-03-25 NOTE — Progress Notes (Signed)
OT Cancellation Note  Patient Details Name: Carlos Phillips MRN: [id number] DOB: [birth year]   Cancelled Treatment:    Reason Eval/Treat Not Completed: Patient at procedure or test/ unavailable.  Will reattempt. Eber Jones., OTR/L Acute Rehabilitation Services Pager [phone] Office [phone]   Jeani Hawking M 03/25/2020, 11:56 AM

## 2020-03-25 NOTE — ED Notes (Signed)
Pt returned to room after US

## 2020-03-25 NOTE — Evaluation (Signed)
Occupational Therapy Evaluation Patient Details Name: Carlos Phillips MRN: [id number] DOB: 1965/11/02 Today's Date: 03/25/2020    History of Present Illness This 54 y.o. male admitted with c/o binocular diplopia.   MRI of brain showed small acute infarct in the midbrain in the expected region of the Rt oculomotor nucleus as well as a small remote lacunar infarct in the Rt thalamus.  PMH includes:  PVD, obesity, HTN, DVT, DM   Clinical Impression   Pt admitted with above. He demonstrates the below listed deficits and will benefit from continued OT to maximize safety and independence with BADLs.  Pt presents to OT with diplopia with all gaze.  He is able to reduce diplopia by closing Rt eye.  He also demonstrates impaired balance as a result of diplopia.  He is able to perform ADLs at supervision level.  He was provided with and instructed in use of occlusion glasses, and reports significant improvement in his vision as he is able to keep both eyes open and use eyes binocularly when glasses in place.  He lives with his wife and was fully independent with ADLs PTA - drives  A truck for a living.  Anticipate he will need OPOT at follow up.        Follow Up Recommendations  Outpatient OT;Supervision - Intermittent    Equipment Recommendations  None recommended by OT    Recommendations for Other Services       Precautions / Restrictions Precautions Precautions: Fall Precaution Comments: secondary to diplopia      Mobility Bed Mobility Overal bed mobility: Modified Independent                  Transfers Overall transfer level: Needs assistance Equipment used: None Transfers: Sit to/from Stand;Stand Pivot Transfers Sit to Stand: Supervision Stand pivot transfers: Supervision            Balance Overall balance assessment: Mild deficits observed, not formally tested                                         ADL either performed or assessed with  clinical judgement   ADL Overall ADL's : Needs assistance/impaired Eating/Feeding: Modified independent;Sitting   Grooming: Wash/dry hands;Wash/dry face;Oral care;Brushing hair;Supervision/safety;Standing   Upper Body Bathing: Set up;Sitting   Lower Body Bathing: Supervison/ safety;Sit to/from stand   Upper Body Dressing : Set up;Sitting   Lower Body Dressing: Supervision/safety;Sit to/from stand   Toilet Transfer: Supervision/safety;Ambulation;Comfort height toilet   Toileting- Clothing Manipulation and Hygiene: Supervision/safety;Sit to/from stand       Functional mobility during ADLs: Supervision/safety       Vision Baseline Vision/History: Wears glasses Wears Glasses: Reading only Patient Visual Report: Diplopia Vision Assessment?: Yes Eye Alignment: Impaired (comment) Ocular Range of Motion: Restricted on the left Tracking/Visual Pursuits: Able to track stimulus in all quads without difficulty Convergence: Within functional limits Diplopia Assessment: Disappears with one eye closed;Objects split on top of one another;Present all the time/all directions Additional Comments: Pt reports he has been keeping Rt eye closed to reduce diplopia     Perception Perception Perception Tested?: Yes   Praxis Praxis Praxis tested?: Within functional limits    Pertinent Vitals/Pain Pain Assessment: No/denies pain     Hand Dominance Right   Extremity/Trunk Assessment Upper Extremity Assessment Upper Extremity Assessment: Overall WFL for tasks assessed   Lower Extremity Assessment Lower Extremity Assessment: Overall  WFL for tasks assessed   Cervical / Trunk Assessment Cervical / Trunk Assessment: Normal   Communication Communication Communication: No difficulties   Cognition Arousal/Alertness: Awake/alert Behavior During Therapy: WFL for tasks assessed/performed Overall Cognitive Status: Within Functional Limits for tasks assessed                                      General Comments  pt instructed in use of occlusion glasses to reduce diplopia    Exercises Exercises: Other exercises Other Exercises Other Exercises: Pt provided with occlusion glasses with Rt lens nasally occluded.  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        OT Problem List: Impaired balance (sitting and/or standing);Impaired vision/perception      OT Treatment/Interventions: Self-care/ADL training;DME and/or AE instruction;Therapeutic activities;Visual/perceptual remediation/compensation;Patient/family education;Balance training    OT Goals(Current goals can be found in the care plan section) Acute Rehab OT Goals Patient Stated Goal: to see normally OT Goal Formulation: With patient/family Time For Goal Achievement: 04/08/20 Potential to Achieve Goals: Good ADL Goals Additional ADL Goal #1: Pt and wife will be indepdent with HEP for vision, and with use of occlusion lenses.  OT Frequency: Min 2X/week   Barriers to D/C:            Co-evaluation              AM-PAC OT "6 Clicks" Daily Activity     Outcome Measure Help from another person eating meals?: None Help from another person taking care of personal grooming?: A Little Help from another person toileting, which includes using toliet, bedpan, or urinal?: A Little Help from another person  bathing (including washing, rinsing, drying)?: A Little Help from another person to put on and taking off regular upper body clothing?: A Little Help from another person to put on and taking off regular lower body clothing?: A Little 6 Click Score: 19   End of Session Nurse Communication: Mobility status  Activity Tolerance: Patient tolerated treatment well Patient left: in bed;with call bell/phone within reach;with family/visitor present  OT Visit Diagnosis: Low vision, both eyes (H54.2)                Time: 1640-1716 OT Time Calculation (min): 36 min Charges:  OT General Charges $OT Visit: 1 Visit OT Evaluation $OT Eval Moderate Complexity: 1 Mod OT Treatments $Self Care/Home Management : 8-22 mins  Eber Jones., OTR/L Acute Rehabilitation Services Pager [phone] Office [phone]   Jeani Hawking M 03/25/2020, 3:41 PM

## 2020-03-25 NOTE — ED Notes (Signed)
Lunch Tray Ordered @ 1038.  

## 2020-03-25 NOTE — Hospital Course (Addendum)
CVA  Patient presented with acute onset diplopia. Mri revealed acute infact in right oculomotor nucleus. Risk stratification labs obtained. CTA head and neck with no abnormalities. Carotid ultrasound showed none hemodynamically significant plaques less than 50% in common carotid arteries. TTE demonstrated LVEF 60-65%, no PFO, no valvular issues.  Neurology consulted and recommended aspirin 81 mg and Plavix 75 mg daily for 3 weeks.  After 3 weeks he will discontinue the aspirin.  The patient's atorvastatin was also increased to 40 mg daily.  Neurology also recommended the patient get an outpatient sleep study to assess for obstructive sleep apnea.  Diabetes Patient previously diagnosed and prescribed metformin, which patient reports was never started. Restarted metformin 500 mg BID during admission with dietary recommendations. Continue to follow up outpatient.   HTN Patient nonadherent with antihypertensives prior to admission. Amlodpine and HCTZ were restarted for neurology recommendations for systolic BP goals on 120-140.   Eosinophilia  Noted in 2018 and 2019 with elevated absolute eosinophil counts.  Repeated absolute eosinophil count during this admission and was 0.9.  Recommend follow-up with hematology outpatient.

## 2020-03-26 DIAGNOSIS — H532 Diplopia: Secondary | ICD-10-CM | POA: Diagnosis not present

## 2020-03-26 DIAGNOSIS — H499 Unspecified paralytic strabismus: Secondary | ICD-10-CM | POA: Diagnosis not present

## 2020-03-26 DIAGNOSIS — I639 Cerebral infarction, unspecified: Secondary | ICD-10-CM | POA: Diagnosis not present

## 2020-03-26 MED ORDER — ASPIRIN 81 MG PO TBEC: 81.0000 mg | DELAYED_RELEASE_TABLET | Freq: Every day | ORAL | 0 refills | Status: DC

## 2020-03-26 MED ORDER — ATORVASTATIN CALCIUM 40 MG PO TABS: 40.0000 mg | ORAL_TABLET | Freq: Every day | ORAL | 0 refills | Status: DC

## 2020-03-26 NOTE — Progress Notes (Signed)
SLP Cancellation Note  Patient Details Name: Carlos Phillips MRN: [id number] DOB: 1966/02/22   Cancelled treatment:       Reason Eval/Treat Not Completed: SLP screened, no needs identified, will sign off. No speech, language, or cognitive deficits identified by other PT/OT. Formal communication evaluation does not appear to be warranted at this time.    Jeron Grahn I. Vear Clock, MS, CCC-SLP Acute Rehabilitation Services Office number [phone] Pager [phone]   Scheryl Marten 03/26/2020, 1:12 PM

## 2020-03-26 NOTE — Discharge Instructions (Signed)
Thank you for allowing [place] to participate in your care!    You were admitted for a stroke.  Your evaluated by neurology and seen by physical therapy and Occupational Therapy.  You were started on Plavix and aspirin.  You will need to take this for 3 weeks so you can discontinue the aspirin on 04/15/2020.  You will continue on the Plavix after this date and will be on it indefinitely.Do NOT take NSAIDs (alleve, advil, naproxen...).  Regarding her FMLA paperwork you can take this to your primary care provider Dr. Carlota Raspberry and he can fill this out.   If you experience worsening of your admission symptoms, develop shortness of breath, life threatening emergency, suicidal or homicidal thoughts you must seek medical attention immediately by calling 911 or calling your MD immediately  if symptoms less severe.    Hospital Discharge After a Stroke  Being discharged from the hospital after a stroke can feel overwhelming. Many things may be different, and it is normal to feel scared or anxious. Some stroke survivors may be able to return to their homes, and others may need more specialized care on a temporary or permanent basis. Your stroke care team will work with you to develop a discharge plan that is best for you. Ask questions if you do not understand something. Invite a friend or family member to participate in discharge planning. Understanding and following your discharge plan can help to prevent another stroke or other problems. Understanding your medicines After a stroke, your health care provider may prescribe one or more types of medicine. It is important to take medicines exactly as told by your health care provider. Serious harm, such as another stroke, can happen if you are unable to take your medicine exactly as prescribed. Make sure you understand:  What medicine to take.  Why you are taking the medicine.  How and when to take it.  If it can be taken with your other medicines and herbal  supplements.  Possible side effects.  When to call your health care provider if you have any side effects.  How you will get and pay for your medicines. Medical assistance programs may be able to help you pay for prescription medicines if you cannot afford them. If you are taking an anticoagulant, be sure to take it exactly as told by your health care provider. This type of medicine can increase the risk of bleeding because it works to prevent blood from clotting. You may need to take certain precautions to prevent bleeding. You should contact your health care provider if you have:  Bleeding or bruising.  A fall or other injury to your head.  Blood in your urine or stool (feces). Planning for home safety  Take steps to prevent falls, such as installing grab bars or using a shower chair. Ask a friend or family member to get needed things in place before you go home if possible. A therapist can come to your home to make recommendations for safety equipment. Ask your health care provider if you would benefit from this service or from home care. Getting needed equipment Ask your health care provider for a list of any medical equipment and supplies you will need at home. These may include items such as:  Walkers.  Canes.  Wheelchairs.  Hand-strengthening devices.  Special eating utensils. Medical equipment can be rented or purchased, depending on your insurance coverage. Check with your insurance company about what is covered. Keeping follow-up visits After a stroke, you  will need to follow up regularly with a health care provider. You may also need rehabilitation, which can include physical therapy, occupational therapy, or speech-language therapy. Keeping these appointments is very important to your recovery after a stroke. Be sure to bring your medicine list and discharge papers with you to your appointments. If you need help to keep track of your schedule, use a calendar or appointment  reminder. Preventing another stroke Having a stroke puts you at risk for another stroke in the future. Ask your health care provider what actions you can take to lower the risk. These may include:  Increasing how much you exercise.  Making a healthy eating plan.  Quitting smoking.  Managing other health conditions, such as high blood pressure, high cholesterol, or diabetes.  Limiting alcohol use. Knowing the warning signs of a stroke  Make sure you understand the signs of a stroke. Before you leave the hospital, you will receive information outlining the stroke warning signs. Share these with your friends and family members. "BE FAST" is an easy way to remember the main warning signs of a stroke:  B - Balance. Signs are dizziness, sudden trouble walking, or loss of balance.  E - Eyes. Signs are trouble seeing or a sudden change in vision.  F - Face. Signs are sudden weakness or numbness of the face, or the face or eyelid drooping on one side.  A - Arms. Signs are weakness or numbness in an arm. This happens suddenly and usually on one side of the body.  S - Speech. Signs are sudden trouble speaking, slurred speech, or trouble understanding what people say.  T - Time. Time to call emergency services. Write down what time symptoms started. Other signs of stroke may include:  A sudden, severe headache with no known cause.  Nausea or vomiting.  Seizure. These symptoms may represent a serious problem that is an emergency. Do not wait to see if the symptoms will go away. Get medical help right away. Call your local emergency services (911 in the [place] Do not drive yourself to the hospital. Make note of the time that you had your first symptoms. Your emergency responders or emergency room staff will need to know this information. Summary  Being discharged from the hospital after a stroke can feel overwhelming. It is normal to feel scared or anxious.  Make sure you take medicines  exactly as told by your health care provider.  Know the warning signs of a stroke, and get help right way if you have any of these symptoms. "BE FAST" is an easy way to remember the main warning signs of a stroke. This information is not intended to replace advice given to you by your health care provider. Make sure you discuss any questions you have with your health care provider. Document Revised: 12/05/2018 Document Reviewed: 06/17/2016 Elsevier Patient Education  2020 [employer].   Physical Therapy After a Stroke After a stroke, some people experience physical changes or problems. Physical therapy may be prescribed to help you recover and overcome problems such as:  Inability to move (paralysis) or weakness, typically affecting one side of the body.  Trouble with balance.  Pain, a pins and needles sensation, or numbness in certain parts of the body. You may also have difficulty feeling touch, pressure, or changes in temperature.  Involuntary muscle tightening (spasticity).  Stiffness in muscles and joints.  Altered coordination and reflexes. What causes physical disability after a stroke? A stroke can damage parts  of your brain that control your body's normal functions, including your ability to move and to keep your balance. The types of physical problems you have will depend on how severe the stroke was and where it was located in the brain. Weakness or paralysis may affect just your fingers and hands, a whole leg or arm, or an entire side of your body. What is physical therapy? Physical therapy involves using exercises, stretches, and activities to help you regain movement and independence after your stroke. Physical therapy may focus on one or more of the following:  Range of motion. This can help with movement and reduce muscle stiffness.  Balance. This helps to lower your risk of falling.  Position changes or transfers, such as moving from sitting to standing or from a  chair to a bed.  Coordination, such as getting an object from a shelf.  Muscle strength. Muscles may be strengthened with weights or by repeating certain motions.  Functional mobility. This may include stair training or learning how to use a wheelchair, walker, or cane.  Walking (gait training).  Activities of daily living, such as getting out of the car or buttoning a shirt. Why is physical therapy important? It is important to do exercises and follow your rehabilitation plan as told by your physical therapist. Physical therapy can:  Help you regain independence.  Prevent injury from falls by building strength and balance.  Lower your risk of blood clots.  Lower your risk of skin sores (pressure injuries).  Increase physical activity and exercise. This may help lower your risk for another stroke.  Help reduce pain. When will therapy start and where will I have therapy? Your health care provider will decide when it is best for you to start therapy. In some cases, people start rehabilitation, including physical therapy, as soon as they are medically stable, which may be 24-48 hours after a stroke. Rehabilitation can take place in a few different places, based on your needs. It may take place in:  The hospital or an in-patient rehabilitation hospital.  An outpatient rehabilitation facility.  A long-term care facility.  A community rehabilitation clinic.  Your home. What are assistive devices? Assistive devices are tools to help you move, maintain balance, and manage daily tasks while recovering from a stroke. Your physical therapist may recommend and help you learn to use:  Equipment to help you move, such as wheelchairs, canes, or walkers.  Braces or splints to keep your arms, hands, legs, or feet in a comfortable and safe position.  Bathtub benches or grab bars to keep you safe in the bathroom.  Special utensils, bowls, and plates that allow you to eat with one  hand. It is important to use these devices as told by your health care provider. Summary  After a stroke, some people may experience physical disabilities, such as weakness or paralysis, pain, or balance problems.  Physical therapy involves exercises, stretches, and activities that help to improve your ability to move and to handle daily tasks.  Physical therapy exercises focus on restoring range of motion, balance, coordination, muscle strength, and the ability to move (mobility).  Physical therapy can help you regain independence, prevent falls, and allow you to live a more active lifestyle after a stroke. This information is not intended to replace advice given to you by your health care provider. Make sure you discuss any questions you have with your health care provider. Document Revised: 07/05/2018 Document Reviewed: 06/20/2016 Elsevier Patient Education  2020 [employer]  Inc[employer]  

## 2020-03-26 NOTE — Evaluation (Signed)
Physical Therapy Evaluation Patient Details Name: Carlos Phillips MRN: [id number] DOB: Jun 16, 1965 Today's Date: 03/26/2020   History of Present Illness  This 54 y.o. male admitted with c/o binocular diplopia.   MRI of brain showed small acute infarct in the midbrain in the expected region of the Rt oculomotor nucleus as well as a small remote lacunar infarct in the Rt thalamus.  PMH includes:  PVD, obesity, HTN, DVT, DM  Clinical Impression   Patient received sitting at EOB, very pleasant and cooperative with PT. Occlusion glasses used during session today. Able to mobilize on a supervision level without difficulty, appears to generally be at his baseline but does fatigue a little more easily than his normal. Felt OK with use of occlusion glasses but does still get dizzy when trying to mobilize without them, which is expected given visual deficits after his CVA. Left sitting up in recliner with all needs met, spouse present. Would very much benefit from skilled f/u in the neuro OP PT setting.     Follow Up Recommendations Outpatient PT ([facility] neuro OP PT)    Equipment Recommendations  None recommended by PT    Recommendations for Other Services       Precautions / Restrictions Precautions Precautions: Fall Precaution Comments: secondary to diplopia Restrictions Weight Bearing Restrictions: No      Mobility  Bed Mobility               General bed mobility comments: sitting on EOB upon entry    Transfers Overall transfer level: Needs assistance Equipment used: None Transfers: Sit to/from Stand;Stand Pivot Transfers Sit to Stand: Supervision Stand pivot transfers: Supervision       General transfer comment: distant S for safety given new vision impairments  Ambulation/Gait Ambulation/Gait assistance: Supervision Gait Distance (Feet): 200 Feet Assistive device: None Gait Pattern/deviations: WFL(Within Functional Limits);Step-through pattern;Wide base of  support;Trendelenburg Gait velocity: WNL   General Gait Details: gait generally WNL, no signfiicant balance deficits noted. Wide BOS but question if this is his baseline.  Stairs Stairs: Yes Stairs assistance: Supervision Stair Management: One rail Left;Forwards;Step to pattern Number of Stairs: 12 General stair comments: stable and steady on steps with U railing, did need general S for safety due to vision impairments but did well  Wheelchair Mobility    Modified Rankin (Stroke Patients Only)       Balance Overall balance assessment: Mild deficits observed, not formally tested                                           Pertinent Vitals/Pain Pain Assessment: No/denies pain    Home Living                        Prior Function                 Hand Dominance        Extremity/Trunk Assessment   Upper Extremity Assessment Upper Extremity Assessment: Defer to OT evaluation    Lower Extremity Assessment Lower Extremity Assessment: Overall WFL for tasks assessed    Cervical / Trunk Assessment Cervical / Trunk Assessment: Normal  Communication      Cognition Arousal/Alertness: Awake/alert Behavior During Therapy: WFL for tasks assessed/performed Overall Cognitive Status: Within Functional Limits for tasks assessed  General Comments: very pleasant and motivated      General Comments General comments (skin integrity, edema, etc.): used occlusion glasses during session    Exercises     Assessment/Plan    PT Assessment Patient needs continued PT services  PT Problem List Obesity;Decreased activity tolerance;Decreased safety awareness;Decreased balance;Decreased knowledge of precautions;Decreased mobility;Decreased coordination       PT Treatment Interventions DME instruction;Balance training;Gait training;Neuromuscular re-education;Stair training;Functional mobility  training;Patient/family education;Therapeutic activities;Therapeutic exercise    PT Goals (Current goals can be found in the Care Plan section)  Acute Rehab PT Goals Patient Stated Goal: to see normally PT Goal Formulation: With patient/family Time For Goal Achievement: 04/09/20 Potential to Achieve Goals: Good    Frequency Min 3X/week   Barriers to discharge        Co-evaluation               AM-PAC PT "6 Clicks" Mobility  Outcome Measure Help needed turning from your back to your side while in a flat bed without using bedrails?: None Help needed moving from lying on your back to sitting on the side of a flat bed without using bedrails?: None Help needed moving to and from a bed to a chair (including a wheelchair)?: None Help needed standing up from a chair using your arms (e.g., wheelchair or bedside chair)?: None Help needed to walk in hospital room?: A Little Help needed climbing 3-5 steps with a railing? : A Little 6 Click Score: 22    End of Session   Activity Tolerance: Patient tolerated treatment well Patient left: in chair;with call bell/phone within reach;with family/visitor present Nurse Communication: Mobility status PT Visit Diagnosis: Unsteadiness on feet (R26.81);Other symptoms and signs involving the nervous system (R29.898);Dizziness and giddiness (R42)    Time: 1753-0104 PT Time Calculation (min) (ACUTE ONLY): 12 min   Charges:   PT Evaluation $PT Eval Low Complexity: 1 Low          Windell Norfolk, DPT, PN1   Supplemental Physical Therapist [place]    Pager [phone] Acute Rehab Office [phone]

## 2020-03-26 NOTE — Progress Notes (Signed)
Carlos Phillips  to be D/C'd Home per MD order.  Discussed with the patient and all questions fully answered.   VSS, Skin clean, dry and intact without evidence of skin break down, no evidence of skin tears noted. IV catheter discontinued intact. Site without signs and symptoms of complications. Dressing and pressure applied.   An After Visit Summary was printed and given to the patient.    D/C education completed with patient/family including follow up instructions, medication list, d/c activities limitations if indicated, with other d/c instructions as indicated by MD - patient able to verbalize understanding, all questions fully answered.    Patient instructed to return to ED, call 911, or call MD for any changes in condition.    Patient escorted via WC, and D/C home via car with wife.

## 2020-03-26 NOTE — Discharge Summary (Signed)
[facility] Discharge Summary  Patient name: Carlos Phillips Medical record number: [id number] Date of birth: 19-Oct-1965 Age: 54 y.o. Gender: male Date of Admission: 03/24/2020  Date of Discharge: 03/26/2020 Admitting Physician: Martyn Malay, MD  Primary Care Provider: Wendie Agreste, MD Consultants: Neurology  Indication for Hospitalization: Acute CVA  Discharge Diagnoses/Problem List:  Acute CVA T2 DM Hypertension CKD stage II Peripheral vascular disease Obesity Eosinophilia  Disposition: Home  Discharge Condition: Stable, improved  Discharge Exam:  General: NAD, non-toxic, well-appearing, lying comfortably in bed HEENT: /AT. PERRLA. EOMI.  Cardiovascular: RRR, normal S1, S2. B/L 2+ RP.  No BLEE Respiratory: CTAB. No IWOB.  Abdomen: + BS. NT, ND, soft to palpation.  Extremities: Warm and well perfused. Moving spontaneously.  Integumentary: No obvious rashes, lesions, trauma on general exam. Neuro: A & O x4.  Cranial nerves intact with exception of abnormal extraocular movements unchanged from exam yesterday.  No other focal neurologic deficits appreciated.   Brief Hospital Course:  CVA  Patient presented with acute onset diplopia. Mri revealed acute infact in right oculomotor nucleus. Risk stratification labs obtained. CTA head and neck with no abnormalities. Carotid ultrasound showed none hemodynamically significant plaques less than 50% in common carotid arteries. TTE demonstrated LVEF 60-65%, no PFO, no valvular issues.  Neurology consulted and recommended aspirin 81 mg and Plavix 75 mg daily for 3 weeks.  After 3 weeks he will discontinue the aspirin.  The patient's atorvastatin was also increased to 40 mg daily.  Neurology also recommended the patient get an outpatient sleep study to assess for obstructive sleep apnea.  Diabetes Patient previously diagnosed and prescribed metformin, which patient reports was never started.  Restarted metformin 500 mg BID during admission with dietary recommendations. Continue to follow up outpatient.   HTN Patient nonadherent with antihypertensives prior to admission. Amlodpine and HCTZ were restarted for neurology recommendations for systolic BP goals on 0000000.   Eosinophilia  Noted in 2018 and 2019 with elevated absolute eosinophil counts.  Repeated absolute eosinophil count during this admission and was 0.9.  Recommend follow-up with hematology outpatient.   Issues for Follow Up:  1. Outpatient neurology follow-up 2.   Outpatient sleep study 3.   If wife needs FMLA paperwork they can take it to PCP 4.  Consider ARB in future for tight BP control 5.   Avoid NSAIDs in the outpatient setting 6.   DAPT x3 weeks 7.   Patient has elevated absolute eosinophil count and this has been persistent.  Consider follow-up with hematology outpatient  Significant Procedures:   Significant Labs and Imaging:  Recent Labs  Lab 03/24/20 1215 03/25/20 0414 03/25/20 1433  WBC 8.8 8.8 8.6  HGB 15.3 15.7 16.4  HCT 47.8 47.3 47.9  PLT 241 235 281   Recent Labs  Lab 03/24/20 1215 03/25/20 0414  NA 139 138  K 3.6 3.8  CL 104 104  CO2 26 24  GLUCOSE 121* 115*  BUN 11 10  CREATININE 1.25* 1.15  CALCIUM 9.1 8.9  ALKPHOS 64  --   AST 20  --   ALT 19  --   ALBUMIN 3.4*  --     CT ANGIO HEAD W OR WO CONTRAST  Addendum Date: 03/24/2020   ADDENDUM REPORT: 03/24/2020 22:00 ADDENDUM: Normal CTA of the head. Electronically Signed   By: Ulyses Jarred M.D.   On: 03/24/2020 22:00   Result Date: 03/24/2020 CLINICAL DATA:  Right eye ophthalmoplegia.  Diplopia. EXAM: CT ANGIOGRAPHY  HEAD TECHNIQUE: Multidetector CT imaging of the head was performed using the standard protocol during bolus administration of intravenous contrast. Multiplanar CT image reconstructions and MIPs were obtained to evaluate the vascular anatomy. CONTRAST:  55mL OMNIPAQUE IOHEXOL 350 MG/ML SOLN COMPARISON:  Brain  MRI 03/24/2020 FINDINGS: CT HEAD Brain: There is no mass, hemorrhage or extra-axial collection. The size and configuration of the ventricles and extra-axial CSF spaces are normal. The brain parenchyma is normal, without acute or chronic infarction. Vascular: No abnormal hyperdensity of the major intracranial arteries or dural venous sinuses. No intracranial atherosclerosis. Skull: The visualized skull base, calvarium and extracranial soft tissues are normal. Sinuses/Orbits: No fluid levels or advanced mucosal thickening of the visualized paranasal sinuses. No mastoid or middle ear effusion. The orbits are normal. CTA HEAD POSTERIOR CIRCULATION: --Vertebral arteries: Normal --Inferior cerebellar arteries: Normal. --Basilar artery: Normal. --Superior cerebellar arteries: Normal. --Posterior cerebral arteries: Normal. ANTERIOR CIRCULATION: --Intracranial internal carotid arteries: Normal. --Anterior cerebral arteries (ACA): Normal. --Middle cerebral arteries (MCA): Normal. ANATOMIC VARIANTS: None Electronically Signed: By: Ulyses Jarred M.D. On: 03/24/2020 21:03   ECHOCARDIOGRAM COMPLETE  Result Date: 03/25/2020    ECHOCARDIOGRAM REPORT   Patient Name:   HENDRIXX MCQUOWN Date of Exam: 03/25/2020 Medical Rec #:  [id number]         Height:       72.0 in Accession #:    [id number]        Weight:       400.0 lb Date of Birth:  1966-01-21         BSA:          2.860 m Patient Age:    47 years          BP:           167/98 mmHg Patient Gender: M                 HR:           76 bpm. Exam Location:  Inpatient Procedure: 2D Echo and Intracardiac Opacification Agent Indications:    Stroke  History:        Patient has no prior history of Echocardiogram examinations.                 Risk Factors:Hypertension, Dyslipidemia and Diabetes.  Sonographer:    Mikki Santee RDCS (AE) Referring Phys: [id number] CARINA M BROWN IMPRESSIONS  1. Technically difficult study. Left ventricular ejection fraction, by estimation, is 60 to  65%. The left ventricle has normal function. The left ventricle has no regional wall motion abnormalities. There is mild left ventricular hypertrophy. Left ventricular diastolic parameters are indeterminate.  2. Right ventricular systolic function is normal. The right ventricular size is normal. Tricuspid regurgitation signal is inadequate for assessing PA pressure.  3. The mitral valve is grossly normal. No evidence of mitral valve regurgitation. No evidence of mitral stenosis.  4. The aortic valve was not well visualized. Aortic valve regurgitation is not visualized. No aortic stenosis is present.  5. The inferior vena cava is normal in size with greater than 50% respiratory variability, suggesting right atrial pressure of 3 mmHg. FINDINGS  Left Ventricle: Left ventricular ejection fraction, by estimation, is 60 to 65%. The left ventricle has normal function. The left ventricle has no regional wall motion abnormalities. Definity contrast agent was given IV to delineate the left ventricular  endocardial borders. The left ventricular internal cavity size was normal in size. There is mild left ventricular hypertrophy. Left ventricular  diastolic parameters are indeterminate. Right Ventricle: The right ventricular size is normal. Right vetricular wall thickness was not well visualized. Right ventricular systolic function is normal. Tricuspid regurgitation signal is inadequate for assessing PA pressure. Left Atrium: Left atrial size was normal in size. Right Atrium: Right atrial size was normal in size. Pericardium: There is no evidence of pericardial effusion. Mitral Valve: The mitral valve is grossly normal. No evidence of mitral valve regurgitation. No evidence of mitral valve stenosis. Tricuspid Valve: The tricuspid valve is grossly normal. Tricuspid valve regurgitation is not demonstrated. Aortic Valve: The aortic valve was not well visualized. Aortic valve regurgitation is not visualized. No aortic stenosis is  present. Pulmonic Valve: The pulmonic valve was not well visualized. Pulmonic valve regurgitation is not visualized. Aorta: The aortic root and ascending aorta are structurally normal, with no evidence of dilitation. Venous: The inferior vena cava is normal in size with greater than 50% respiratory variability, suggesting right atrial pressure of 3 mmHg. IAS/Shunts: The interatrial septum was not well visualized.  LEFT VENTRICLE PLAX 2D LVIDd:         4.40 cm  Diastology LVIDs:         2.70 cm  LV e' medial:    6.31 cm/s LV PW:         1.20 cm  LV E/e' medial:  8.0 LV IVS:        1.20 cm  LV e' lateral:   8.49 cm/s LVOT diam:     2.60 cm  LV E/e' lateral: 6.0 LV SV:         79 LV SV Index:   27 LVOT Area:     5.31 cm  RIGHT VENTRICLE RV S prime:     13.10 cm/s LEFT ATRIUM             Index       RIGHT ATRIUM           Index LA diam:        3.20 cm 1.12 cm/m  RA Area:     13.50 cm LA Vol (A2C):   26.0 ml 9.09 ml/m  RA Volume:   26.20 ml  9.16 ml/m LA Vol (A4C):   30.0 ml 10.49 ml/m LA Biplane Vol: 28.3 ml 9.89 ml/m  AORTIC VALVE LVOT Vmax:   72.50 cm/s LVOT Vmean:  51.400 cm/s LVOT VTI:    0.148 m  AORTA Ao Root diam: 3.50 cm MITRAL VALVE MV Area (PHT): 2.91 cm    SHUNTS MV Decel Time: 261 msec    Systemic VTI:  0.15 m MV E velocity: 50.60 cm/s  Systemic Diam: 2.60 cm MV A velocity: 49.30 cm/s MV E/A ratio:  1.03 Epifanio Lesches MD Electronically signed by Epifanio Lesches MD Signature Date/Time: 03/25/2020/3:35:43 PM    Final    VAS US CAROTID (at [facility] and [facility] only)  Result Date: 03/26/2020 Carotid Arterial Duplex Study Performing Technologist: Ernestene Mention  Examination Guidelines: A complete evaluation includes B-mode imaging, spectral Doppler, color Doppler, and power Doppler as needed of all accessible portions of each vessel. Bilateral testing is considered an integral part of a complete examination. Limited examinations for reoccurring indications may be performed as noted.  Right Carotid  Findings: +----------+--------+--------+--------+------------------+--------+           PSV cm/sEDV cm/sStenosisPlaque DescriptionComments +----------+--------+--------+--------+------------------+--------+ CCA Prox  69      20                                         +----------+--------+--------+--------+------------------+--------+  CCA Distal58      16                                         +----------+--------+--------+--------+------------------+--------+ ICA Prox  66      23                                         +----------+--------+--------+--------+------------------+--------+ ICA Distal68      29                                         +----------+--------+--------+--------+------------------+--------+ ECA       87      13                                         +----------+--------+--------+--------+------------------+--------+ +----------+--------+-------+--------+-------------------+           PSV cm/sEDV cmsDescribeArm Pressure (mmHG) +----------+--------+-------+--------+-------------------+ Subclavian100     0                                  +----------+--------+-------+--------+-------------------+ +---------+--------+--+--------+-+ VertebralPSV cm/s34EDV cm/s8 +---------+--------+--+--------+-+  Left Carotid Findings: +----------+--------+--------+--------+------------------+--------+           PSV cm/sEDV cm/sStenosisPlaque DescriptionComments +----------+--------+--------+--------+------------------+--------+ CCA Prox  133     17                                         +----------+--------+--------+--------+------------------+--------+ CCA Distal81      17                                         +----------+--------+--------+--------+------------------+--------+ ICA Prox  53      19                                         +----------+--------+--------+--------+------------------+--------+ ICA Distal54       23                                         +----------+--------+--------+--------+------------------+--------+ ECA       87      11                                         +----------+--------+--------+--------+------------------+--------+ +----------+--------+--------+--------+-------------------+           PSV cm/sEDV cm/sDescribeArm Pressure (mmHG) +----------+--------+--------+--------+-------------------+ [id number]     0                                   +----------+--------+--------+--------+-------------------+ +---------+--------+--+--------+--+  VertebralPSV cm/s41EDV cm/s13 +---------+--------+--+--------+--+   Summary: Right Carotid: Velocities in the right ICA are consistent with a 1-39% stenosis.                Non-hemodynamically significant plaque <50% noted in the CCA. Left Carotid: Velocities in the left ICA are consistent with a 1-39% stenosis.               Non-hemodynamically significant plaque <50% noted in the CCA. Vertebrals:  Bilateral vertebral arteries demonstrate antegrade flow. Subclavians: Normal flow hemodynamics were seen in bilateral subclavian              arteries. *See table(s) above for measurements and observations.  Electronically signed by Antony Contras MD on 03/26/2020 at 10:47:10 AM.    Final      Results/Tests Pending at Time of Discharge: None  Discharge Medications:  Allergies as of 03/26/2020      Reactions   Morphine And Related Nausea And Vomiting      Medication List    TAKE these medications   amLODipine 10 MG tablet Commonly known as: NORVASC TAKE 1 TABLET BY MOUTH EVERY DAY What changed:   how much to take  how to take this  when to take this  additional instructions   aspirin 81 MG EC tablet Take 1 tablet (81 mg total) by mouth daily. Swallow whole. Start taking on: March 27, 2020   atorvastatin 40 MG tablet Commonly known as: LIPITOR Take 1 tablet (40 mg total) by mouth daily. Start taking on:  March 27, 2020 What changed:   medication strength  how much to take  how to take this  when to take this  additional instructions   cloNIDine 0.1 MG tablet Commonly known as: CATAPRES TAKE ONE TABLET BY MOUTH TWICE DAILY What changed:   how much to take  how to take this  when to take this  additional instructions   clopidogrel 75 MG tablet Commonly known as: PLAVIX Take 1 tablet (75 mg total) by mouth daily with breakfast.   hydrochlorothiazide 25 MG tablet Commonly known as: HYDRODIURIL TAKE 1 TABLET BY MOUTH EVERY DAY   Lancet Device Misc To test blood sugar once per day. Dx 250.00.  Brand per insurance coverage.   lisinopril 5 MG tablet Commonly known as: ZESTRIL Take 1 tablet (5 mg total) by mouth daily.   metFORMIN 500 MG tablet Commonly known as: GLUCOPHAGE Take 1 tablet (500 mg total) by mouth 2 (two) times daily with a meal.   multivitamin with minerals tablet Take 1 tablet by mouth daily.   sildenafil 100 MG tablet Commonly known as: Viagra Take 0.5-1 tablets (50-100 mg total) by mouth daily as needed for erectile dysfunction.       Discharge Instructions: Please refer to Patient Instructions section of EMR for full details.  Patient was counseled important signs and symptoms that should prompt return to medical care, changes in medications, dietary instructions, activity restrictions, and follow up appointments.   Follow-Up Appointments:  Follow-up Information    [facility]. Schedule an appointment as soon as possible for a visit in 4 week(s).   Specialty: Neurology Contact information: [address] [place] [phone]              Gifford Shave, MD 03/26/2020, 1:33 PM PGY-2, [place]

## 2020-03-26 NOTE — Plan of Care (Signed)
Pt and family understanding of discharge info

## 2020-04-02 ENCOUNTER — Ambulatory Visit: Payer: BC Managed Care – PPO | Admitting: Family Medicine

## 2020-04-02 ENCOUNTER — Encounter: Payer: Self-pay | Admitting: Family Medicine

## 2020-04-02 ENCOUNTER — Other Ambulatory Visit: Payer: Self-pay

## 2020-04-02 VITALS — BP 132/80 | HR 100 | Temp 96.6°F | Ht 72.0 in | Wt 361.0 lb

## 2020-04-02 DIAGNOSIS — I639 Cerebral infarction, unspecified: Secondary | ICD-10-CM

## 2020-04-02 DIAGNOSIS — I10 Essential (primary) hypertension: Secondary | ICD-10-CM

## 2020-04-02 DIAGNOSIS — D721 Eosinophilia, unspecified: Secondary | ICD-10-CM

## 2020-04-02 DIAGNOSIS — R42 Dizziness and giddiness: Secondary | ICD-10-CM

## 2020-04-02 DIAGNOSIS — E782 Mixed hyperlipidemia: Secondary | ICD-10-CM | POA: Diagnosis not present

## 2020-04-02 DIAGNOSIS — I6930 Unspecified sequelae of cerebral infarction: Secondary | ICD-10-CM

## 2020-04-02 DIAGNOSIS — Z09 Encounter for follow-up examination after completed treatment for conditions other than malignant neoplasm: Secondary | ICD-10-CM

## 2020-04-02 DIAGNOSIS — E1159 Type 2 diabetes mellitus with other circulatory complications: Secondary | ICD-10-CM | POA: Diagnosis not present

## 2020-04-02 DIAGNOSIS — Z23 Encounter for immunization: Secondary | ICD-10-CM

## 2020-04-02 DIAGNOSIS — R0683 Snoring: Secondary | ICD-10-CM

## 2020-04-02 DIAGNOSIS — Z1159 Encounter for screening for other viral diseases: Secondary | ICD-10-CM

## 2020-04-02 DIAGNOSIS — R5383 Other fatigue: Secondary | ICD-10-CM | POA: Diagnosis not present

## 2020-04-02 DIAGNOSIS — Z6841 Body Mass Index (BMI) 40.0 and over, adult: Secondary | ICD-10-CM

## 2020-04-02 DIAGNOSIS — H499 Unspecified paralytic strabismus: Secondary | ICD-10-CM

## 2020-04-02 MED ORDER — HYDROCHLOROTHIAZIDE 25 MG PO TABS: 25.0000 mg | ORAL_TABLET | Freq: Every day | ORAL | 1 refills | Status: DC

## 2020-04-02 MED ORDER — AMLODIPINE BESYLATE 10 MG PO TABS: 10.0000 mg | ORAL_TABLET | Freq: Every day | ORAL | 1 refills | Status: DC

## 2020-04-02 MED ORDER — METFORMIN HCL 500 MG PO TABS: 500.0000 mg | ORAL_TABLET | Freq: Every day | ORAL | 1 refills | Status: DC

## 2020-04-02 NOTE — Patient Instructions (Addendum)
Try taking metformin once per day for now to see if that helps nausea.   Keep follow up with neurology as planned - continue plavix and aspirin for now, but stop aspirin after January 21st.  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   Blood pressure ok today - no changes for now. Make sure to drink plenty of water. If dizziness is not improving, can look at other causes but may be related to eye issue.   Recheck cholesterol and other labs in 1 month, sooner if dizziness is not improving or new symptoms.   Return to the clinic or go to the nearest emergency room if any of your symptoms worsen or new symptoms occur.  If you have lab work done today you will be contacted with your lab results within the next 2 weeks.  If you have not heard from [place] then please contact us. The fastest way to get your results is to register for My Chart.   IF you received an x-ray today, you will receive an invoice from [facility]. Please contact [facility] at [phone] with questions or concerns regarding your invoice.   IF you received labwork today, you will receive an invoice from [facility]. Please contact [facility] at [phone] with questions or concerns regarding your invoice.   Our billing staff will not be able to assist you with questions regarding bills from these companies.  You will be contacted with the lab results as soon as they are available. The fastest way to get your results is to activate your My Chart account. Instructions are located on the last page of this paperwork. If you have not heard from [place] regarding the results in 2 weeks, please contact this office.

## 2020-04-02 NOTE — Progress Notes (Signed)
Subjective:  Patient ID: Carlos Phillips, male    DOB: 01-Aug-1965  Age: 55 y.o. MRN: [id number]  CC:  Chief Complaint  Patient presents with  . Hospitalization Follow-up    Pt reports on 03/24/2020 that his vision suddenly became real bury, he felt nauseas, and fatigued.  so the pt went to his local urgent care where they sent the pt to the hospital. Pt reports that he was told after they did a CAT scan, chest [place], and MRI that the pt has a blood clot in his brain that was effecting his vision. Pt had a negative COVID test at hospital as well.Pt states they gave him Asprin and upped his BP medication. Pt reports since leaving the hospital his vision has been improving.    HPI Carlos Phillips presents for  Hospital follow-up.    Admitted December 28 through December 30 with acute CVA.  CVA Presented with acute onset diplopia.  MRI revealed acute infarct in the right oculomotor nucleus.   MRI brain 12/28: IMPRESSION: 1. Small acute infarct in the midbrain, in the expected region of the right oculomotor nucleus. No substantial edema or mass effect. 2. Age advanced T2/FLAIR hyperintensities within the white matter, most likely related to chronic microvascular ischemic disease given the patient's reported risk factors. Additional differential considerations include prior demyelination, trauma, inflammation, or migraines. 3. Small remote lacunar infarct in the right thalamus.  CTA head, Angiogram reassuring.   Carotid ultrasound and significant plaques less than 50% in common carotids.  Transthoracic echo EF 60 to 65% with no PFO, no valvular issues.  Neurology consulted, aspirin 81 mg, Plavix 75 mg daily for 3 weeks, and then Plavix alone.  Atorvastatin increased to 40 mg daily.  Outpatient sleep study recommended to rule out OSA. Neuro follow up 04/29/20.  Doing ok on higher dose of atorvastatin.  Slight nausea, dizzy with standing at times and some fatigue - symptoms since CVA -  thought to be due to visual issue.  Dizzy with standing at times. No syncope. Eating and drinking ok.  Not driving.  Still taking asa and plavix, plan on DAPT for 3 weeks.    Diabetes Had not been taking metformin, metformin was restarted at 500 mg twice daily with dietary recommendations. Ran out metformin few days ago. No change in nausea. No diarrhea. Lab Results  Component Value Date   HGBA1C 6.4 (H) 03/25/2020   Hypertension: Some concern with adherence prior to admission.  Amlodipine 10mg  and HCTZ 16m qd were restarted with systolic blood pressure goal 120-140. Not taking lisinopril, not taking clonidine.  Home readings:none.  Snoring at night. Has daytime fatigue since CVA, not prior.  Takes naps at times during day.  No chest pain.   BP Readings from Last 3 Encounters:  04/02/20 132/80  03/26/20 (!) 154/95  03/24/20 (!) 167/100   Lab Results  Component Value Date   CREATININE 1.15 03/25/2020   Eosinophilia Noted previously, eosinophil count during admission was 0.9.  Outpatient hematology follow-up discussed.  Hyperlipidemia: On lipitor 40mg  qd since hospitalization.  Lab Results  Component Value Date   CHOL 160 03/25/2020   HDL 43 03/25/2020   LDLCALC 93 03/25/2020   TRIG 118 03/25/2020   CHOLHDL 3.7 03/25/2020   Lab Results  Component Value Date   ALT 19 03/24/2020   AST 20 03/24/2020   ALKPHOS 64 03/24/2020   BILITOT 0.3 03/24/2020      History Patient Active Problem List   Diagnosis Date  Noted  . Diplopia   . Ophthalmoplegia of right eye   . CVA (cerebral vascular accident) ([place]) 03/24/2020  . Acute CVA (cerebrovascular accident) ([place]) 03/24/2020  . Hyperlipidemia 12/31/2016  . PVD (peripheral vascular disease) ([place]) 01/29/2016  . Diverticulitis 01/28/2016  . Special screening for malignant neoplasms, colon   . Type 2 diabetes mellitus with diabetic dermatitis, without long-term current use of insulin ([place]) 05/03/2015  . History of deep vein  thrombosis (DVT) of lower extremity 03/07/2013  . Ischemia of extremity 02/25/2013  . HTN (hypertension) 05/07/2011  . Erectile dysfunction 05/07/2011  . Obesity 05/07/2011   Past Medical History:  Diagnosis Date  . Diabetes mellitus without complication ([place])   . DVT (deep venous thrombosis) ([place]) 02/2013  . Erectile dysfunction   . Hyperlipidemia   . Hypertension   . Obesity    BMI >52  . PVD (peripheral vascular disease) ([place])    Past Surgical History:  Procedure Laterality Date  . ABDOMINAL AORTAGRAM N/A 02/28/2013   Procedure: ABDOMINAL Maxcine Ham;  Surgeon: Conrad Naugatuck, MD;  Location[facility] C[facility] LAB;  Service: Cardiovascular;  Laterality: N/A;  . COLONOSCOPY WITH PROPOFOL N/A 12/25/2015   Procedure: COLONOSCOPY WITH PROPOFOL;  Surgeon: Manus Gunning, MD;  Location: [facility] ENDOSCOPY;  Service: Gastroenterology;  Laterality: N/A;  . EMBOLECTOMY Left 02/25/2013   Procedure: Left Popliteal EMBOLECTOMY Poss fasciotomy;  Surgeon: Elam Dutch, MD;  Location: [facility] OR;  Service: Vascular;  Laterality: Left;  Left poplital and Tibial embolectomy with four compartment Fasciotomy with vein patch angioplasty left popliteal artery.   Allergies  Allergen Reactions  . Morphine And Related Nausea And Vomiting   Prior to Admission medications   Medication Sig Start Date End Date Taking? Authorizing Provider  amLODipine (NORVASC) 10 MG tablet TAKE 1 TABLET BY MOUTH EVERY DAY Patient taking differently: Take 10 mg by mouth daily. 01/30/20  Yes Wendie Agreste, MD  aspirin EC 81 MG EC tablet Take 1 tablet (81 mg total) by mouth daily. Swallow whole. 03/27/20  Yes Gifford Shave, MD  atorvastatin (LIPITOR) 40 MG tablet Take 1 tablet (40 mg total) by mouth daily. 03/27/20  Yes Gifford Shave, MD  cloNIDine (CATAPRES) 0.1 MG tablet TAKE ONE TABLET BY MOUTH TWICE DAILY Patient taking differently: Take 0.1 mg by mouth 2 (two) times daily. 02/17/18  Yes Emeterio Reeve, DO  clopidogrel  (PLAVIX) 75 MG tablet Take 1 tablet (75 mg total) by mouth daily with breakfast. 11/21/18  Yes Wendie Agreste, MD  hydrochlorothiazide (HYDRODIURIL) 25 MG tablet TAKE 1 TABLET BY MOUTH EVERY DAY Patient taking differently: Take 25 mg by mouth daily. 01/30/20  Yes Wendie Agreste, MD  Lancet Device MISC To test blood sugar once per day. Dx 250.00.  Brand per insurance coverage. 02/17/18  Yes Emeterio Reeve, DO  lisinopril (ZESTRIL) 5 MG tablet Take 1 tablet (5 mg total) by mouth daily. 11/21/18  Yes Wendie Agreste, MD  metFORMIN (GLUCOPHAGE) 500 MG tablet Take 1 tablet (500 mg total) by mouth 2 (two) times daily with a meal. 11/21/18  Yes Wendie Agreste, MD  Multiple Vitamins-Minerals (MULTIVITAMIN WITH MINERALS) tablet Take 1 tablet by mouth daily.   Yes [provider]  sildenafil (VIAGRA) 100 MG tablet Take 0.5-1 tablets (50-100 mg total) by mouth daily as needed for erectile dysfunction. 06/12/19 06/10/20 Yes Wendie Agreste, MD   Social History   Socioeconomic History  . Marital status: Married    Spouse name: Ivin Booty  . Number  of children: 2  . Years of education: Not on file  . Highest education level: Not on file  Occupational History  . Occupation: Truck Geophysicist/field seismologist  Tobacco Use  . Smoking status: Former Smoker    Packs/day: 1.00    Years: 30.00    Pack years: 30.00    Types: Cigarettes    Quit date: 02/25/2013    Years since quitting: 7.1  . Smokeless tobacco: Never Used  Vaping Use  . Vaping Use: Never used  Substance and Sexual Activity  . Alcohol use: Yes    Alcohol/week: 4.0 standard drinks    Types: 4 Standard drinks or equivalent per week    Comment: social- rare  . Drug use: No  . Sexual activity: Yes    Partners: Female  Other Topics Concern  . Not on file  Social History Narrative  . Not on file   Social Determinants of Health   Financial Resource Strain: Not on file  Food Insecurity: Not on file  Transportation Needs: Not on file   Physical Activity: Not on file  Stress: Not on file  Social Connections: Not on file  Intimate Partner Violence: Not on file    Review of Systems  Constitutional: Positive for fatigue.  Eyes: Positive for visual disturbance (still feeling like eyes want to cross, but not double vision. ).  Cardiovascular: Negative for chest pain.    Objective:   Vitals:   04/02/20 1059 04/02/20 1200  BP: (!) 141/90 132/80  Pulse: 100   Temp: (!) 96.6 F (35.9 C)   TempSrc: Temporal   SpO2: 100%   Weight: (!) 361 lb (163.7 kg)   Height: 6' (1.829 m)      Physical Exam Vitals reviewed.  Constitutional:      General: He is not in acute distress.    Appearance: Normal appearance. He is well-developed and well-nourished. He is obese. He is not ill-appearing, toxic-appearing or diaphoretic.  HENT:     Head: Normocephalic and atraumatic.  Eyes:     Extraocular Movements:     Right eye: Abnormal extraocular motion (slight lag on R eye with terminal left gaze. ) present.     Pupils: Pupils are equal, round, and reactive to light.  Neck:     Vascular: No carotid bruit or JVD.  Cardiovascular:     Rate and Rhythm: Normal rate and regular rhythm.     Heart sounds: Normal heart sounds. No murmur heard.   Pulmonary:     Effort: Pulmonary effort is normal.     Breath sounds: Normal breath sounds. No rales.  Musculoskeletal:        General: No edema.  Skin:    General: Skin is warm and dry.  Neurological:     Mental Status: He is alert and oriented to person, place, and time.  Psychiatric:        Mood and Affect: Mood and affect and mood normal.        Behavior: Behavior normal.     42 minutes spent during visit, greater than 50% counseling and assimilation of information, chart review, and discussion of plan.    Assessment & Plan:  Carlos Phillips is a 55 y.o. male . Ophthalmoplegia of right eye Acute CVA (cerebrovascular accident) ([place]) Snoring - Plan: Ambulatory referral to  Sleep Studies Fatigue, unspecified type - Plan: Ambulatory referral to Sleep Studies, Basic metabolic panel, CBC, Orthostatic vital signs  -Some slight improvement in his ophthalmic symptoms.  Some of dizziness  may still be related to CVA and ophthalmoplegia.  Continue follow-up with neurology.  Dual antiplatelet therapy for 3 weeks, then just Plavix discussed.  -Refer to sleep specialist for possible underlying OSA.  Continued hydration, regular meals with RTC precautions if any worsening dizziness or not continuing to improve.  Need for vaccination - Plan: Flu Vaccine QUAD 36+ mos IM, Pneumococcal conjugate vaccine 13-valent IM  Encounter for hepatitis C screening test for low risk patient  BMI 45.0-49.9, adult (HCC) - Plan: Ambulatory referral to Sleep Studies  Essential hypertension - Plan: amLODipine (NORVASC) 10 MG tablet, hydrochlorothiazide (HYDRODIURIL) 25 MG tablet  Controlled type 2 diabetes mellitus with other circulatory complication, without long-term current use of insulin (HCC) - Plan: metFORMIN (GLUCOPHAGE) 500 MG tablet  -Decrease metformin to 500 mg daily based on previous readings.  Mixed hyperlipidemia  -Continue statin.  Eosinophilia, unspecified type - Plan: Ambulatory referral to Hematology  -Refer to hematology to decide on further work-up versus ongoing monitoring.  Episodic lightheadedness - Plan: Orthostatic vital signs  -As above  Meds ordered this encounter  Medications  . amLODipine (NORVASC) 10 MG tablet    Sig: Take 1 tablet (10 mg total) by mouth daily.    Dispense:  90 tablet    Refill:  1  . hydrochlorothiazide (HYDRODIURIL) 25 MG tablet    Sig: Take 1 tablet (25 mg total) by mouth daily.    Dispense:  90 tablet    Refill:  1  . metFORMIN (GLUCOPHAGE) 500 MG tablet    Sig: Take 1 tablet (500 mg total) by mouth daily with breakfast.    Dispense:  90 tablet    Refill:  1   Patient Instructions   Try taking metformin once per day for now to  see if that helps nausea.   Keep follow up with neurology as planned - continue plavix and aspirin for now, but stop aspirin after January 21st.  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   Blood pressure ok today - no changes for now. Make sure to drink plenty of water. If dizziness is not improving, can look at other causes but may be related to eye issue.   Recheck cholesterol and other labs in 1 month.   Return to the clinic or go to the nearest emergency room if any of your symptoms worsen or new symptoms occur.  If you have lab work done today you will be contacted with your lab results within the next 2 weeks.  If you have not heard from [place] then please contact us. The fastest way to get your results is to register for My Chart.   IF you received an x-ray today, you will receive an invoice from [facility]. Please contact [facility] at [phone] with questions or concerns regarding your invoice.   IF you received labwork today, you will receive an invoice from [facility]. Please contact [facility] at [phone] with questions or concerns regarding your invoice.   Our billing staff will not be able to assist you with questions regarding bills from these companies.  You will be contacted with the lab results as soon as they are available. The fastest way to get your results is to activate your My Chart account. Instructions are located on the last page of this paperwork. If  you have not heard from [place] regarding the results in 2 weeks, please contact this office.         Signed, [name], MD [facility]

## 2020-04-03 ENCOUNTER — Encounter: Payer: Self-pay | Admitting: Family Medicine

## 2020-04-03 LAB — BASIC METABOLIC PANEL
BUN/Creatinine Ratio: 11 (ref 9–20)
BUN: 15 mg/dL (ref 6–24)
CO2: 25 mmol/L (ref 20–29)
Calcium: 10.2 mg/dL (ref 8.7–10.2)
Chloride: 96 mmol/L (ref 96–106)
Creatinine, Ser: 1.38 mg/dL — ABNORMAL HIGH (ref 0.76–1.27)
GFR calc Af Amer: 66 mL/min/{1.73_m2} (ref >59)
GFR calc non Af Amer: 57 mL/min/{1.73_m2} — ABNORMAL LOW (ref >59)
Glucose: 129 mg/dL — ABNORMAL HIGH (ref 65–99)
Potassium: 4.3 mmol/L (ref 3.5–5.2)
Sodium: 138 mmol/L (ref 134–144)

## 2020-04-03 LAB — CBC
Hematocrit: 53.9 % — ABNORMAL HIGH (ref 37.5–51.0)
Hemoglobin: 18 g/dL — ABNORMAL HIGH (ref 13.0–17.7)
MCH: 28.9 pg (ref 26.6–33.0)
MCHC: 33.4 g/dL (ref 31.5–35.7)
MCV: 87 fL (ref 79–97)
Platelets: 286 10*3/uL (ref 150–450)
RBC: 6.22 x10E6/uL — ABNORMAL HIGH (ref 4.14–5.80)
RDW: 14.4 % (ref 11.6–15.4)
WBC: 6.2 10*3/uL (ref 3.4–10.8)

## 2020-04-07 ENCOUNTER — Telehealth: Payer: Self-pay

## 2020-04-07 NOTE — Telephone Encounter (Signed)
Patient's spouse requires FMLA due to patient's current health conditions. [employer] paperwork faxed to office. Digital copy scanned to MD Docs. Hardcopy in [employer] on second floor.

## 2020-04-08 ENCOUNTER — Telehealth: Payer: Self-pay | Admitting: Family Medicine

## 2020-04-08 NOTE — Telephone Encounter (Signed)
I called pt and let him know we have received FMLA paperwork to be filled out by Dr. Carlota Raspberry. He was not there, but I was able to leave a vm letting him know that he had a $15 admin fee. He can call us back and we can take his payment  over the phone. I have given the paperwork to [facility].

## 2020-04-09 NOTE — Telephone Encounter (Signed)
Received LOA forms from [employer] [place] for patient. Will scan to e-mail and keep hardcopy in [place]

## 2020-04-09 NOTE — Telephone Encounter (Signed)
Printed and filled out waiting on provider. Waiting on pt forms then both will go to provider together

## 2020-04-09 NOTE — Telephone Encounter (Signed)
Paperwork scanned to e-mail. Hardcopy in [employer].

## 2020-04-09 NOTE — Telephone Encounter (Addendum)
04/09/2020 - PATIENT PAID HIS FMLA FEE BY TELEPHONE FOR $15.00. I WILL ROUTE TO [employer] FOR HER TO REVIEW. [place]

## 2020-04-09 NOTE — Telephone Encounter (Signed)
Pt and wife leave paperwork in the green folder to be signed any questions please let me know. I can fax these once you are done.   Thank you

## 2020-04-10 ENCOUNTER — Other Ambulatory Visit: Payer: Self-pay | Admitting: Family Medicine

## 2020-04-10 DIAGNOSIS — I739 Peripheral vascular disease, unspecified: Secondary | ICD-10-CM

## 2020-04-11 NOTE — Telephone Encounter (Signed)
Forms completed.  Some additional information may need to be completed by neurology but I have completed as much as possible.  Placed back in green folder in chart box.

## 2020-04-14 NOTE — Telephone Encounter (Signed)
Forms received and faxed to [facility] group waiting on confirmation then send to scan

## 2020-04-18 ENCOUNTER — Other Ambulatory Visit: Payer: Self-pay | Admitting: Family Medicine

## 2020-04-20 ENCOUNTER — Telehealth: Payer: Self-pay | Admitting: Family Medicine

## 2020-04-20 NOTE — Telephone Encounter (Signed)
Paperwork originally faxed to office on 04/08/20. Drop off paperwork is a copy.

## 2020-04-20 NOTE — Telephone Encounter (Signed)
Carlos Phillips dropped of FMLA to be completed by provider for husbansd illnes( [employer] ) Placed in [employer]

## 2020-04-29 ENCOUNTER — Encounter: Payer: Self-pay | Admitting: Adult Health

## 2020-04-29 ENCOUNTER — Other Ambulatory Visit: Payer: Self-pay

## 2020-04-29 ENCOUNTER — Ambulatory Visit: Payer: BC Managed Care – PPO | Admitting: Adult Health

## 2020-04-29 VITALS — BP 148/88 | HR 86 | Ht 72.0 in | Wt 374.0 lb

## 2020-04-29 DIAGNOSIS — I639 Cerebral infarction, unspecified: Secondary | ICD-10-CM | POA: Diagnosis not present

## 2020-04-29 MED ORDER — ONDANSETRON 4 MG PO TBDP: 4.0000 mg | ORAL_TABLET | Freq: Three times a day (TID) | ORAL | 4 refills | Status: DC | PRN

## 2020-04-29 NOTE — Progress Notes (Signed)
[facility] [address] [place]. [place] [address] [phone]       HOSPITAL FOLLOW UP NOTE  Mr. Carlos Phillips Date of Birth:  12/22/1965 Medical Record Number:  [id number]   Reason for Referral:  hospital stroke follow up    SUBJECTIVE:   CHIEF COMPLAINT:  Chief Complaint  Patient presents with  . Follow-up    Treatment room with wife (Carlos Phillips) Pt is well, has some double vision/ dizziness     HPI:   Mr. Carlos Phillips is a 55 y.o. male with history of supermorbid obesity, DVT, HLD, PVD, DM and HTN  who presented to [facility] ED on 03/24/2020 with binocular diplopia. Personally reviewed hospitalization pertinent progress notes, lab work and imaging with summary provided. Evaluated by Dr. 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 In addition to acute infarct, MRI showed chronic small right thalamic lacunae. CTA head and carotid Dopplers unremarkable. Recommended DAPT for 3 weeks then Plavix alone as on Plavix PTA. History of HTN stable. History of HLD atorvastatin 20 mg PTA with LDL 93 and recommended increased dosage to 40 mg daily. Controlled DM with A1c 6.4. Other stroke risk factors include former tobacco use, EtOH use, THC use (UDS positive), morbid obesity, PVD, hx of DVT, prior stroke on imaging and suspected sleep apnea. Residual deficit right eye difficulty with downward and inward gaze consistent with right CN IV palsy. Evaluated by therapy and recommended outpatient OT discharge in stable condition.  Stroke: Small midbrain infarct secondary to small vessel disease source  MRI  Small midbrain infarct. Chronic small R thalamic lacune.  CTA head Unremarkable   Carotid Doppler  B ICA 1-39% stenosis, VAs antegrade   2D Echo EF 60 to 65%  LDL 93  HgbA1c 6.4  VTE prophylaxis - Lovenox 90 mg sq daily   clopidogrel 75 mg daily prior to admission, now on aspirin 81 mg daily and clopidogrel 75  mg daily. Continue DAPT x 3 weeks then Plavix alone.   Therapy recommendations:  outpt PT/OT  Disposition:   Home  Today, 04/29/2020, Carlos Phillips is being seen for hospital follow-up accompanied by his wife.  He does report continued occasional dizziness or imbalance as well as intolerance to prolonged standing due to increased nausea but this has been slowly improving.  He denies residual diplopia but does report upper black floater OD outer superior temporal quadrant typically seen with eye movement and will quickly resolve.  He also reports light sensitivity.  Denies new or worsening stroke/TIA symptoms.  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 Per patient, PCP is waiting until today's visit to determine duration of disability.  He has not participated in any type of therapy but was referred to neuro rehab PT/OT at discharge (he has not been called to schedule initial evaluations).  He has completed 3 weeks DAPT and remains on Plavix without bleeding or bruising.  Remains on atorvastatin 40 mg daily without myalgias.  Blood pressure today initially elevated and on recheck 148/88.  He does not routinely monitor at home.  He is scheduled for sleep evaluation with Dr. Rexene Alberts on 2/8 for suspected sleep apnea.  No further concerns at this time.    ROS:   14 system review of systems performed and negative with exception of those listed in HPI  PMH:  Past Medical History:  Diagnosis Date  .  Diabetes mellitus without complication ([facility])   . DVT (deep venous thrombosis) ([facility]) 02/2013  . Erectile dysfunction   . Hyperlipidemia   . Hypertension   . Obesity    BMI >52  . PVD (peripheral vascular disease) ([facility])     PSH:  Past Surgical History:  Procedure Laterality Date  . ABDOMINAL AORTAGRAM N/A 02/28/2013   Procedure: ABDOMINAL Maxcine Ham;  Surgeon: Conrad Dover, MD;  Location: [facility] C[facility] LAB;   Service: Cardiovascular;  Laterality: N/A;  . COLONOSCOPY WITH PROPOFOL N/A 12/25/2015   Procedure: COLONOSCOPY WITH PROPOFOL;  Surgeon: Manus Gunning, MD;  Location: [facility] ENDOSCOPY;  Service: Gastroenterology;  Laterality: N/A;  . EMBOLECTOMY Left 02/25/2013   Procedure: Left Popliteal EMBOLECTOMY Poss fasciotomy;  Surgeon: Elam Dutch, MD;  Location[facility] OR;  Service: Vascular;  Laterality: Left;  Left poplital and Tibial embolectomy with four compartment Fasciotomy with vein patch angioplasty left popliteal artery.    Social History:  Social History   Socioeconomic History  . Marital status: Married    Spouse name: Carlos Phillips  . Number of children: 2  . Years of education: Not on file  . Highest education level: Not on file  Occupational History  . Occupation: Truck Geophysicist/field seismologist  Tobacco Use  . Smoking status: Former Smoker    Packs/day: 1.00    Years: 30.00    Pack years: 30.00    Types: Cigarettes    Quit date: 02/25/2013    Years since quitting: 7.1  . Smokeless tobacco: Never Used  Vaping Use  . Vaping Use: Never used  Substance and Sexual Activity  . Alcohol use: Yes    Alcohol/week: 4.0 standard drinks    Types: 4 Standard drinks or equivalent per week    Comment: social- rare  . Drug use: No  . Sexual activity: Yes    Partners: Female  Other Topics Concern  . Not on file  Social History Narrative  . Not on file   Social Determinants of Health   Financial Resource Strain: Not on file  Food Insecurity: Not on file  Transportation Needs: Not on file  Physical Activity: Not on file  Stress: Not on file  Social Connections: Not on file  Intimate Partner Violence: Not on file    Family History:  Family History  Problem Relation Age of Onset  . Diabetes Mother   . Heart disease Father   . Asthma Son   . Colon cancer Neg Hx     Medications:   Current Outpatient Medications on File Prior to Visit  Medication Sig Dispense Refill  . amLODipine (NORVASC)  10 MG tablet Take 1 tablet (10 mg total) by mouth daily. 90 tablet 1  . atorvastatin (LIPITOR) 40 MG tablet TAKE 1 TABLET BY MOUTH EVERY DAY 90 tablet 1  . clopidogrel (PLAVIX) 75 MG tablet TAKE 1 TABLET BY MOUTH EVERY DAY WITH BREAKFAST 90 tablet 0  . hydrochlorothiazide (HYDRODIURIL) 25 MG tablet Take 1 tablet (25 mg total) by mouth daily. 90 tablet 1  . metFORMIN (GLUCOPHAGE) 500 MG tablet Take 1 tablet (500 mg total) by mouth daily with breakfast. 90 tablet 1  . Multiple Vitamins-Minerals (MULTIVITAMIN WITH MINERALS) tablet Take 1 tablet by mouth daily.    . sildenafil (VIAGRA) 100 MG tablet Take 0.5-1 tablets (50-100 mg total) by mouth daily as needed for erectile dysfunction. 30 tablet 0   No current facility-administered medications on file prior to visit.    Allergies:   Allergies  Allergen Reactions  .  Morphine And Related Nausea And Vomiting      OBJECTIVE:  Physical Exam  Vitals:   04/29/20 0907  BP: (!) 148/88  Pulse: 86  Weight: (!) 374 lb (169.6 kg)  Height: 6' (1.829 m)   Body mass index is 50.72 kg/m. No exam data present  Post stroke PHQ 2/9 Depression screen PHQ 2/9 04/02/2020  Decreased Interest 0  Down, Depressed, Hopeless 0  PHQ - 2 Score 0     General: Morbidly obese pleasant middle-aged African-American male, seated, in no evident distress Head: head normocephalic and atraumatic.   Neck: supple with no carotid or supraclavicular bruits Cardiovascular: regular rate and rhythm, no murmurs Musculoskeletal: no deformity Skin:  no rash/petichiae Vascular:  Normal pulses all extremities   Neurologic Exam Mental Status: Awake and fully alert.   Fluent speech and language.  Oriented to place and time. Recent and remote memory intact. Attention span, concentration and fund of knowledge appropriate. Mood and affect appropriate.  Cranial Nerves: Fundoscopic exam reveals sharp disc margins. Pupils equal, briskly reactive to light. Extraocular movements OD  difficulty with inward gaze otherwise full without nystagmus.  Visual fields full to confrontation. Hearing intact. Facial sensation intact. Face, tongue, palate moves normally and symmetrically.  Motor: Normal bulk and tone. Normal strength in all tested extremity muscles Sensory.: intact to touch , pinprick , position and vibratory sensation.  Coordination: Rapid alternating movements normal in all extremities. Finger-to-nose and heel-to-shin performed accurately bilaterally. Gait and Station: Arises from chair without difficulty. Stance is normal. Gait demonstrates normal stride length but mild imbalance especially with turns.  Ambulates without assistive device.  Able to tandem walk and heel toe with mild difficulty. Reflexes: 1+ and symmetric. Toes downgoing.     NIHSS  0 Modified Rankin  2      ASSESSMENT: Carlos Phillips is a 55 y.o. year old male presented with binocular diplopia on 03/24/2020 with stroke work-up revealing small midbrain infarct secondary to small vessel disease. Vascular risk factors include HTN, HLD, DM, hx of tobacco use, EtOH use, substance abuse (THC), morbid obesity, PVD, hx of DVT and prior strokes on imaging.      PLAN:  1. midbrain stroke:  a. Residual deficit: Imbalance, intermittent dizziness and activity intolerance with nausea.   i. Slowly recovering but highly recommend participating in neuro rehab PT/OT -he was advised to stop into their office after today's visit to schedule initial evaluations.   ii. Currently on short-term disability per PCP and recommend giving himself at least 3 additional months for further recovery.  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 For occasional nausea worsened with increased activity,  prescribed Zofran 4 mg every 8 hours as needed as well as use prior to therapy sessions as this may interfere/limit full recovery potential b. Continue clopidogrel 75 mg daily  and atorvastatin 40 mg daily for secondary stroke prevention.   c. Discussed secondary stroke prevention measures and importance of close PCP follow up for aggressive stroke risk factor management  2. HTN: BP goal <130/90.  Stable on amlodipine and hydrochlorothiazide per PCP 3. HLD: LDL goal <70. Recent LDL 93 therefore increased atorvastatin dosage from 20 mg to 40 mg daily during recent stroke admission.  Request follow-up with PCP in the next 1 to 2 months for repeat lipid panel and ongoing prescribing of atorvastatin 4. DMII: A1c goal<7.0.  Well-controlled with recent A1c 6.4 on Metformin per PCP 5. Suspected sleep apnea: Initial evaluation with Dr. Rexene Alberts scheduled 2/8    Follow up in 3 months or call earlier if needed   CC:  [place] provider: Dr. Suzi Roots, Ranell Patrick, MD    I spent 45 minutes of face-to-face and non-face-to-face time with patient and wife.  This included previsit chart review including recent hospitalization pertinent progress notes, lab work and imaging, lab review, study review, order entry, electronic health record documentation, patient education regarding recent stroke including etiology, residual deficits, importance of managing stroke risk factors and answered all other questions to patient and wife's satisfaction   Frann Rider, AGNP-BC  [facility] [address] [place], [place] [address]  Phone [phone] Fax [phone] Note: This document was prepared with digital dictation and possible smart phrase technology. Any transcriptional errors that result from this process are unintentional.

## 2020-04-29 NOTE — Patient Instructions (Addendum)
Please stop in to neuro rehab to schedule physical and occupation therapy  Would recommend being on disability for additional 3 months - I will contact your PCP to let him know  Continue clopidogrel 75 mg daily  and atorvastatin 40mg  daily  for secondary stroke prevention  Recommend use of Zofran as needed for nausea - also recommend to take prior to therapy sessions  Continue to follow up with PCP regarding cholesterol and blood pressure management  Maintain strict control of hypertension with blood pressure goal below 130/90 and cholesterol with LDL cholesterol (bad cholesterol) goal below 70 mg/dL.       Followup in the future with me in 3 months or call earlier if needed       Thank you for coming to see [place] at [facility]. I hope we have been able to provide you high quality care today.  You may receive a patient satisfaction survey over the next few weeks. We would appreciate your feedback and comments so that we may continue to improve ourselves and the health of our patients.

## 2020-05-04 ENCOUNTER — Encounter: Payer: Self-pay | Admitting: Family Medicine

## 2020-05-04 ENCOUNTER — Ambulatory Visit: Payer: BC Managed Care – PPO | Admitting: Family Medicine

## 2020-05-04 ENCOUNTER — Other Ambulatory Visit: Payer: Self-pay

## 2020-05-04 VITALS — BP 136/88 | HR 91 | Temp 99.0°F | Ht 72.0 in | Wt 379.0 lb

## 2020-05-04 DIAGNOSIS — I6930 Unspecified sequelae of cerebral infarction: Secondary | ICD-10-CM

## 2020-05-04 DIAGNOSIS — R7989 Other specified abnormal findings of blood chemistry: Secondary | ICD-10-CM | POA: Diagnosis not present

## 2020-05-04 DIAGNOSIS — H499 Unspecified paralytic strabismus: Secondary | ICD-10-CM

## 2020-05-04 DIAGNOSIS — E1159 Type 2 diabetes mellitus with other circulatory complications: Secondary | ICD-10-CM

## 2020-05-04 DIAGNOSIS — I10 Essential (primary) hypertension: Secondary | ICD-10-CM

## 2020-05-04 DIAGNOSIS — I639 Cerebral infarction, unspecified: Secondary | ICD-10-CM

## 2020-05-04 DIAGNOSIS — R21 Rash and other nonspecific skin eruption: Secondary | ICD-10-CM

## 2020-05-04 LAB — BASIC METABOLIC PANEL
BUN/Creatinine Ratio: 12 (ref 9–20)
BUN: 15 mg/dL (ref 6–24)
CO2: 22 mmol/L (ref 20–29)
Calcium: 10.1 mg/dL (ref 8.7–10.2)
Chloride: 103 mmol/L (ref 96–106)
Creatinine, Ser: 1.22 mg/dL (ref 0.76–1.27)
GFR calc Af Amer: 77 mL/min/{1.73_m2} (ref >59)
GFR calc non Af Amer: 66 mL/min/{1.73_m2} (ref >59)
Glucose: 144 mg/dL — ABNORMAL HIGH (ref 65–99)
Potassium: 4.3 mmol/L (ref 3.5–5.2)
Sodium: 142 mmol/L (ref 134–144)

## 2020-05-04 LAB — GLUCOSE, POCT (MANUAL RESULT ENTRY): POC Glucose: 149 mg/dl — AB (ref 70–99)

## 2020-05-04 MED ORDER — TRIAMCINOLONE ACETONIDE 0.1 % EX CREA: 1.0000 "application " | TOPICAL_CREAM | Freq: Two times a day (BID) | CUTANEOUS | 1 refills | Status: DC

## 2020-05-04 MED ORDER — SITAGLIPTIN PHOSPHATE 50 MG PO TABS: 50.0000 mg | ORAL_TABLET | Freq: Every day | ORAL | 1 refills | Status: DC

## 2020-05-04 MED ORDER — GLUCOSE BLOOD VI STRP: ORAL_STRIP | 12 refills | Status: DC

## 2020-05-04 MED ORDER — ONETOUCH ULTRASOFT LANCETS MISC: 12 refills | Status: DC

## 2020-05-04 NOTE — Progress Notes (Signed)
Subjective:  Patient ID: Carlos Phillips, male    DOB: 1965/08/31  Age: 55 y.o. MRN: [id number]  CC:  Chief Complaint  Patient presents with  . Follow-up    On hypertension,hyperlipidemia, and diabetes. PT reports no issues with these conditions since last OV that he has noticed. Pt reports he has done his best to avoid foods he known he shouldn't have due to his medical conditions. Pt isn't currently fasting.    HPI Carlos Phillips presents for  Follow-up from January 6 visit.  Hospital follow-up at that time  CVA with right eye ophthalmoplegia. Acute CVA in December.  See last visit.  Initial aspirin plus Plavix for 3 weeks and then Plavix alone.  Atorvastatin 40 mg daily.  Outpatient sleep study ordered last visit.  He was having some slight nausea, dizziness with standing at times, some fatigue at last visit. Neuro follow-up February 2.  Residual deficit of imbalance, intermittent dizziness and activity intolerance with nausea.  Plan for neuro rehab, PT/OT.  3 additional months out of work recommended for further recovery.  Discussed 1 year waiting.  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  Zofran prescribed for intermittent nausea.  Continued on Plavix and atorvastatin current doses.  BP goal less than 130/90.  Plan for repeat lipid panel in 1 to 2 months.  Appointment with sleep specialist tomorrow. 10-month neuro follow-up.  Feels like getting better. Still with dizziness if turning head quickly, or standing prolonged time.  Rehab starts in 2 days. Has taken zofran few days ago- helped.  No cp/dyspnea. No new weakness, no new bleeding.  On disability for 6 months at this time. Out of work at this time.   Diabetes: With neurological complication of CVA as above, PVD.  Metformin had been started twice daily in hospital, recommended daily dosing to see if that helped with nausea at his last visit with me January 6. Taking metformin 500mg  qd. Feels fatigued,  diarrhea few hrs later. Diarrhea 2 times per day. No change with QD dosing.  Creatinine up from 1.15 to 1.38 last visit. Range 1.19-1.38 since 2019.  Out of test strips.   Microalbumin: nl ratio in 2020.  Optho, foot exam, pneumovax:  Referring to optho.  covid vaccine in 9/10 of 2021.  Flu and prevnar last visit.  Immunization History  Administered Date(s) Administered  . Influenza,inj,Quad PF,6+ Mos 04/02/2020  . Pneumococcal Conjugate-13 04/02/2020  . Tdap 08/05/2013    Lab Results  Component Value Date   HGBA1C 6.4 (H) 03/25/2020   HGBA1C 6.8 (H) 04/24/2019   HGBA1C 6.6 (H) 11/21/2018   Lab Results  Component Value Date   MICROALBUR 0.3 06/21/2015   LDLCALC 93 03/25/2020   CREATININE 1.38 (H) 04/02/2020   Hypertension: Amlodipine 10 mg, HCTZ 25 mg were restarted in the hospital with a goal BP 04/17/1938 systolic.  He was off lisinopril, clonidine last visit.  Refer to sleep specialist as above to rule out OSA. Home readings:none. Will be looking into meter.   BP Readings from Last 3 Encounters:  05/04/20 136/88  04/29/20 (!) 148/88  04/02/20 132/80   Lab Results  Component Value Date   CREATININE 1.38 (H) 04/02/2020   Rash on back: Since last summer. Across back, flank, and some  Thought was soap. Some itching at times. Better after cortisone cream- less itch.  Using homemade soap - drying skin? Dove soap prior - feels like broke out after using Dove once -  had been doing ok for years.  History Patient Active Problem List   Diagnosis Date Noted  . Diplopia   . Ophthalmoplegia of right eye   . CVA (cerebral vascular accident) ([place]) 03/24/2020  . Acute CVA (cerebrovascular accident) ([place]) 03/24/2020  . Hyperlipidemia 12/31/2016  . PVD (peripheral vascular disease) ([place]) 01/29/2016  . Diverticulitis 01/28/2016  . Special screening for malignant neoplasms, colon   . Type 2 diabetes mellitus with diabetic dermatitis, without long-term current use of insulin ([place])  05/03/2015  . History of deep vein thrombosis (DVT) of lower extremity 03/07/2013  . Ischemia of extremity 02/25/2013  . HTN (hypertension) 05/07/2011  . Erectile dysfunction 05/07/2011  . Obesity 05/07/2011   Past Medical History:  Diagnosis Date  . Diabetes mellitus without complication ([place])   . DVT (deep venous thrombosis) ([place]) 02/2013  . Erectile dysfunction   . Hyperlipidemia   . Hypertension   . Obesity    BMI >52  . PVD (peripheral vascular disease) ([place])    Past Surgical History:  Procedure Laterality Date  . ABDOMINAL AORTAGRAM N/A 02/28/2013   Procedure: ABDOMINAL Maxcine Ham;  Surgeon: Conrad Cache, MD;  Location: [facility] CATH LAB;  Service: Cardiovascular;  Laterality: N/A;  . COLONOSCOPY WITH PROPOFOL N/A 12/25/2015   Procedure: COLONOSCOPY WITH PROPOFOL;  Surgeon: Manus Gunning, MD;  Location: [facility] ENDOSCOPY;  Service: Gastroenterology;  Laterality: N/A;  . EMBOLECTOMY Left 02/25/2013   Procedure: Left Popliteal EMBOLECTOMY Poss fasciotomy;  Surgeon: Elam Dutch, MD;  Location: [facility] OR;  Service: Vascular;  Laterality: Left;  Left poplital and Tibial embolectomy with four compartment Fasciotomy with vein patch angioplasty left popliteal artery.   Allergies  Allergen Reactions  . Morphine And Related Nausea And Vomiting   Prior to Admission medications   Medication Sig Start Date End Date Taking? Authorizing Provider  amLODipine (NORVASC) 10 MG tablet Take 1 tablet (10 mg total) by mouth daily. 04/02/20  Yes Carlos Agreste, MD  atorvastatin (LIPITOR) 40 MG tablet TAKE 1 TABLET BY MOUTH EVERY DAY 04/20/20  Yes Carlos Agreste, MD  clopidogrel (PLAVIX) 75 MG tablet TAKE 1 TABLET BY MOUTH EVERY DAY WITH BREAKFAST 04/10/20  Yes Carlos Agreste, MD  hydrochlorothiazide (HYDRODIURIL) 25 MG tablet Take 1 tablet (25 mg total) by mouth daily. 04/02/20  Yes Carlos Agreste, MD  metFORMIN (GLUCOPHAGE) 500 MG tablet Take 1 tablet (500 mg total) by mouth daily with  breakfast. 04/02/20  Yes Carlos Agreste, MD  Multiple Vitamins-Minerals (MULTIVITAMIN WITH MINERALS) tablet Take 1 tablet by mouth daily.   Yes [provider]  ondansetron (ZOFRAN ODT) 4 MG disintegrating tablet Take 1 tablet (4 mg total) by mouth every 8 (eight) hours as needed for nausea or vomiting. 04/29/20  Yes Frann Rider, NP  sildenafil (VIAGRA) 100 MG tablet Take 0.5-1 tablets (50-100 mg total) by mouth daily as needed for erectile dysfunction. 06/12/19 06/10/20 Yes Carlos Agreste, MD   Social History   Socioeconomic History  . Marital status: Married    Spouse name: Carlos Phillips  . Number of children: 2  . Years of education: Not on file  . Highest education level: Not on file  Occupational History  . Occupation: Truck Geophysicist/field seismologist  Tobacco Use  . Smoking status: Former Smoker    Packs/day: 1.00    Years: 30.00    Pack years: 30.00    Types: Cigarettes    Quit date: 02/25/2013    Years since quitting: 7.1  . Smokeless tobacco:  Never Used  Vaping Use  . Vaping Use: Never used  Substance and Sexual Activity  . Alcohol use: Yes    Alcohol/week: 4.0 standard drinks    Types: 4 Standard drinks or equivalent per week    Comment: social- rare  . Drug use: No  . Sexual activity: Yes    Partners: Female  Other Topics Concern  . Not on file  Social History Narrative  . Not on file   Social Determinants of Health   Financial Resource Strain: Not on file  Food Insecurity: Not on file  Transportation Needs: Not on file  Physical Activity: Not on file  Stress: Not on file  Social Connections: Not on file  Intimate Partner Violence: Not on file    Review of Systems   Objective:   Vitals:   05/04/20 0947 05/04/20 1054  BP: (!) 142/86 136/88  Pulse: 91   Temp: 99 F (37.2 C)   TempSrc: Temporal   SpO2: 100%   Weight: (!) 379 lb (171.9 kg)   Height: 6' (1.829 m)      Physical Exam Vitals reviewed.  Constitutional:      Appearance: He is well-developed and  well-nourished. He is obese.  HENT:     Head: Normocephalic and atraumatic.  Eyes:     Extraocular Movements: EOM normal.     Pupils: Pupils are equal, round, and reactive to light.  Neck:     Vascular: No carotid bruit or JVD.  Cardiovascular:     Rate and Rhythm: Normal rate and regular rhythm.     Heart sounds: Normal heart sounds. No murmur heard.   Pulmonary:     Effort: Pulmonary effort is normal.     Breath sounds: Normal breath sounds. No rales.  Musculoskeletal:        General: No edema.  Skin:    General: Skin is warm and dry.     Findings: Rash present.     Comments: Excoriated areas across back, posterior leg just below the knee.  Few hyperpigmented areas.  See photo.  Neurological:     Mental Status: He is alert and oriented to person, place, and time.  Psychiatric:        Mood and Affect: Mood and affect normal.       39 minutes spent during visit, greater than 50% counseling and assimilation of information, chart review, and discussion of plan.    Assessment & Plan:  KERBY BORNER is a 55 y.o. male . Controlled type 2 diabetes mellitus with other circulatory complication, without long-term current use of insulin (HCC) - Plan: glucose blood test strip, Lancets (ONETOUCH ULTRASOFT) lancets, POCT glucose (manual entry), Microalbumin / creatinine urine ratio, sitaGLIPtin (JANUVIA) 50 MG tablet, Ambulatory referral to Ophthalmology  - intolerant to metformin. Stop and try [place]. Repeat A1c in 2 months.   Essential hypertension - Plan: Basic metabolic panel  - improved on recheck.  No changes.   Elevated serum creatinine - Plan: Basic metabolic panel  -Slight bump in creatinine last time, recheck  Ophthalmoplegia of right eye - Plan: Ambulatory referral to Ophthalmology Acute CVA (cerebrovascular accident) [facility]) - Plan: Ambulatory referral to Ophthalmology  -Continue follow-up with PT/OT and spine as well as specialist as planned.  Will refer to  ophthalmology as due for diabetic eye screening and can follow-up ophthalmoplegia as well  Rash and nonspecific skin eruption - Plan: triamcinolone (KENALOG) 0.1 %  -Based on area involved in timing suspicious for dry skin component,  atopic dermatitis possible.  Trial of triamcinolone topical twice daily as needed for pruritus, Eucerin or other hydrating cream multiple times per day, hydrating soap.  If not improving the next few weeks would recommend dermatology eval and I can place that referral.   Meds ordered this encounter  Medications  . glucose blood test strip    Sig: Use as instructed    Dispense:  100 each    Refill:  12  . Lancets (ONETOUCH ULTRASOFT) lancets    Sig: Use as instructed    Dispense:  100 each    Refill:  12  . sitaGLIPtin (JANUVIA) 50 MG tablet    Sig: Take 1 tablet (50 mg total) by mouth daily.    Dispense:  90 tablet    Refill:  1  . triamcinolone (KENALOG) 0.1 %    Sig: Apply 1 application topically 2 (two) times daily.    Dispense:  80 g    Refill:  1   Patient Instructions    Stop metformin for now. If diarrhea or fatigue is not improving - return to look at other causes. Start januvia once per day for diabetes. If not covered by insurance, can look at other options.   Keep follow up with rehab and other specialists.   Try moisturizing soap such as Dove or [place], Eucerin lotion 2- 3 times per day for dry skin. New steroid cream 2 times per day to itching areas. If rash not improving, let me know and I will refer you to dermatology. Return sooner if worse.  Discuss return to work with neurologist, including any restrictions.  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    If you have lab work done today you will be contacted with your lab results within the next 2 weeks.  If you have not heard from [place] then please contact us. The fastest way to get your results is to register for My Chart.   IF you received an x-ray today, you  will receive an invoice from [facility]. Please contact [facility] at [phone] with questions or concerns regarding your invoice.   IF you received labwork today, you will receive an invoice from [facility]. Please contact [facility] at [phone] with questions or concerns regarding your invoice.   Our billing staff will not be able to assist you with questions regarding bills from these companies.  You will be contacted with the lab results as soon as they are available. The fastest way to get your results is to activate your My Chart account. Instructions are located on the last page of this paperwork. If you have not heard from [place] regarding the results in 2 weeks, please contact this office.         Signed, [name], MD [facility]

## 2020-05-04 NOTE — Patient Instructions (Addendum)
  Stop metformin for now. If diarrhea or fatigue is not improving - return to look at other causes. Start januvia once per day for diabetes. If not covered by insurance, can look at other options.   Keep follow up with rehab and other specialists.   Try moisturizing soap such as Dove or [place], Eucerin lotion 2- 3 times per day for dry skin. New steroid cream 2 times per day to itching areas. If rash not improving, let me know and I will refer you to dermatology. Return sooner if worse.  Discuss return to work with neurologist, including any restrictions.  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    If you have lab work done today you will be contacted with your lab results within the next 2 weeks.  If you have not heard from [place] then please contact us. The fastest way to get your results is to register for My Chart.   IF you received an x-ray today, you will receive an invoice from [facility]. Please contact [facility] at [phone] with questions or concerns regarding your invoice.   IF you received labwork today, you will receive an invoice from [place]. Please contact [facility] at [phone] with questions or concerns regarding your invoice.   Our billing staff will not be able to assist you with questions regarding bills from these companies.  You will be contacted with the lab results as soon as they are available. The fastest way to get your results is to activate your My Chart account. Instructions are located on the last page of this paperwork. If you have not heard from [place] regarding the results in 2 weeks, please contact this office.

## 2020-05-05 ENCOUNTER — Ambulatory Visit (INDEPENDENT_AMBULATORY_CARE_PROVIDER_SITE_OTHER): Payer: BC Managed Care – PPO | Admitting: Neurology

## 2020-05-05 ENCOUNTER — Encounter: Payer: Self-pay | Admitting: Neurology

## 2020-05-05 VITALS — BP 134/88 | HR 96 | Ht 72.0 in | Wt 378.3 lb

## 2020-05-05 DIAGNOSIS — I639 Cerebral infarction, unspecified: Secondary | ICD-10-CM

## 2020-05-05 DIAGNOSIS — R351 Nocturia: Secondary | ICD-10-CM

## 2020-05-05 DIAGNOSIS — Z8673 Personal history of transient ischemic attack (TIA), and cerebral infarction without residual deficits: Secondary | ICD-10-CM

## 2020-05-05 DIAGNOSIS — R0683 Snoring: Secondary | ICD-10-CM

## 2020-05-05 DIAGNOSIS — Z6841 Body Mass Index (BMI) 40.0 and over, adult: Secondary | ICD-10-CM

## 2020-05-05 LAB — MICROALBUMIN / CREATININE URINE RATIO
Creatinine, Urine: 217.6 mg/dL
Microalb/Creat Ratio: 5 mg/g creat (ref 0–29)
Microalbumin, Urine: 9.8 ug/mL

## 2020-05-05 NOTE — Progress Notes (Signed)
Subjective:    Patient ID: Carlos Phillips is a 55 y.o. male.  HPI     Star Age, MD, PhD [facility] [address], [address] [place], [place] [address]  Dear Dr. Carlota Raspberry,   I saw your patient, Carlos Phillips, upon your kind request, in my Sleep clinic today for initial consultation of his sleep disorder, in particular, concern for underlying obstructive sleep apnea. The patient is unaccompanied today.  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  He has nocturia about 3-4 times per average night.  He is currently not working.  He previously worked as a Administrator.  He would go to work at 2 AM and work till 1230 or 1 PM.  When working, he would go to bed around 4 or 5 PM and sleep till midnight.  Currently he goes to bed around 730 or 8 PM and wakes up around 530 or 6.  He denies recurrent morning headaches.  He has residual visual symptoms since his midbrain stroke but no weakness.  He has not fallen thankfully.  I reviewed your office note from April 02, 2020.  His Epworth sleepiness score is 8 out of 24 today, fatigue severity score is 21 out of 63.  He is married and lives with his wife, they have 2 grown children, 1 son, 1 daughter.  They have no pets at the house.  He does have a TV on in his bedroom at night and it turns off on a sleep timer typically.  He is not aware of any family history of sleep apnea.  He is working on weight loss.  He quit smoking in 2014.  He denies drinking alcohol currently, has not had any for about a month but has a history of heavier drinking on the weekends he admits.  He drinks caffeine occasionally in the form of  coffee or soda, none daily.   His Past Medical History Is Significant For: Past Medical History:  Diagnosis Date  . Diabetes mellitus without complication ([place])   . DVT (deep venous thrombosis) ([place]) 02/2013  . Erectile dysfunction   . Hyperlipidemia   . Hypertension   . Obesity    BMI >52  . PVD (peripheral vascular disease) ([place])     His Past Surgical History Is Significant For: Past Surgical History:  Procedure Laterality Date  . ABDOMINAL AORTAGRAM N/A 02/28/2013   Procedure: ABDOMINAL Maxcine Ham;  Surgeon: Conrad [place], MD;  Location: [facility] CATH LAB;  Service: Cardiovascular;  Laterality: N/A;  . COLONOSCOPY WITH PROPOFOL N/A 12/25/2015   Procedure: COLONOSCOPY WITH PROPOFOL;  Surgeon: Manus Gunning, MD;  Location: [facility] ENDOSCOPY;  Service: Gastroenterology;  Laterality: N/A;  . EMBOLECTOMY Left 02/25/2013   Procedure: Left Popliteal EMBOLECTOMY Poss fasciotomy;  Surgeon: Elam Dutch, MD;  Location: [facility] OR;  Service: Vascular;  Laterality: Left;  Left poplital and Tibial embolectomy with four compartment Fasciotomy with vein patch angioplasty left popliteal artery.    His Family History Is Significant For: Family History  Problem Relation Age of Onset  . Diabetes Mother   . Heart disease Father   . Asthma Son   . Colon cancer Neg Hx     His Social History  Is Significant For: Social History   Socioeconomic History  . Marital status: Married    Spouse name: Ivin Booty  . Number of children: 2  . Years of education: Not on file  . Highest education level: Not on file  Occupational History  . Occupation: Truck Geophysicist/field seismologist  Tobacco Use  . Smoking status: Former Smoker    Packs/day: 1.00    Years: 30.00    Pack years: 30.00    Types: Cigarettes    Quit date: 02/25/2013    Years since quitting: 7.1  . Smokeless tobacco: Never Used  Vaping Use  . Vaping Use: Never used  Substance and Sexual Activity  . Alcohol use: Yes    Alcohol/week: 4.0 standard drinks    Types:  4 Standard drinks or equivalent per week    Comment: social- rare  . Drug use: No  . Sexual activity: Yes    Partners: Female  Other Topics Concern  . Not on file  Social History Narrative  . Not on file   Social Determinants of Health   Financial Resource Strain: Not on file  Food Insecurity: Not on file  Transportation Needs: Not on file  Physical Activity: Not on file  Stress: Not on file  Social Connections: Not on file    His Allergies Are:  Allergies  Allergen Reactions  . Morphine And Related Nausea And Vomiting  :   His Current Medications Are:  Outpatient Encounter Medications as of 05/05/2020  Medication Sig  . amLODipine (NORVASC) 10 MG tablet Take 1 tablet (10 mg total) by mouth daily.  Marland Kitchen atorvastatin (LIPITOR) 40 MG tablet TAKE 1 TABLET BY MOUTH EVERY DAY  . clopidogrel (PLAVIX) 75 MG tablet TAKE 1 TABLET BY MOUTH EVERY DAY WITH BREAKFAST  . glucose blood test strip Use as instructed  . hydrochlorothiazide (HYDRODIURIL) 25 MG tablet Take 1 tablet (25 mg total) by mouth daily.  . Lancets (ONETOUCH ULTRASOFT) lancets Use as instructed  . Multiple Vitamins-Minerals (MULTIVITAMIN WITH MINERALS) tablet Take 1 tablet by mouth daily.  . ondansetron (ZOFRAN ODT) 4 MG disintegrating tablet Take 1 tablet (4 mg total) by mouth every 8 (eight) hours as needed for nausea or vomiting.  . sildenafil (VIAGRA) 100 MG tablet Take 0.5-1 tablets (50-100 mg total) by mouth daily as needed for erectile dysfunction.  . sitaGLIPtin (JANUVIA) 50 MG tablet Take 1 tablet (50 mg total) by mouth daily.  Marland Kitchen triamcinolone (KENALOG) 0.1 % Apply 1 application topically 2 (two) times daily.   No facility-administered encounter medications on file as of 05/05/2020.  :  Review of Systems:  Out of a complete 14 point review of systems, all are reviewed and negative with the exception of these symptoms as listed below: Review of Systems  Neurological:       Here for sleep consult. No prior sleep  study. Pt reports he does snore at night.  Epworth Sleepiness Scale 0= would never doze 1= slight chance of dozing 2= moderate chance of dozing 3= high chance of dozing  Sitting and reading:1 Watching TV:1 Sitting inactive in a public place (ex. Theater or meeting):0 As a passenger in a car for an hour without a break:0 Lying down to rest in the afternoon:3 Sitting and talking to someone:0 Sitting quietly after lunch (no alcohol):3 In a car, while stopped in traffic:0 Total:8     Objective:  Neurological Exam  Physical Exam Physical Examination:   Vitals:   05/05/20 1058  BP: 134/88  Pulse: 96  SpO2: 97%    General Examination: The patient is a very pleasant 55 y.o. male in no acute distress. He appears well-developed and well-nourished and well groomed.   HEENT: Normocephalic, atraumatic, pupils are equal, round and reactive to light, extraocular tracking is good without limitation to gaze excursion or nystagmus noted. Hearing is grossly intact. Face is symmetric with normal facial animation. Speech is clear with no dysarthria noted. There is no hypophonia. There is no lip, neck/head, jaw or voice tremor. Neck is supple with full range of passive and active motion. There are no carotid bruits on auscultation. Oropharynx exam reveals: mild mouth dryness, adequate to marginal dental hygiene with several teeth missing, moderate airway crowding secondary to tonsillar size of 2+ on the right, 1+ on the left and larger tongue, uvula normal in size.  Mallampati class II, neck circumference of 18 1/8 inches.  He does not have an overbite.   Chest: Clear to auscultation without wheezing, rhonchi or crackles noted.  Heart: S1+S2+0, regular and normal without murmurs, rubs or gallops noted.   Abdomen: Soft, non-tender and non-distended with normal bowel sounds appreciated on auscultation.  Extremities: There is no pitting edema in the distal lower extremities bilaterally.   Skin:  Warm and dry without trophic changes noted.   Musculoskeletal: exam reveals no obvious joint deformities, tenderness or joint swelling or erythema.   Neurologically:  Mental status: The patient is awake, alert and oriented in all 4 spheres. His immediate and remote memory, attention, language skills and fund of knowledge are appropriate. There is no evidence of aphasia, agnosia, apraxia or anomia. Speech is clear with normal prosody and enunciation. Thought process is linear. Mood is normal and affect is normal.  Cranial nerves II - XII are as described above under HEENT exam.  Motor exam: Normal bulk, strength and tone is noted. There is no tremor, fine motor skills and coordination: grossly intact.  Cerebellar testing: No dysmetria or intention tremor. There is no truncal or gait ataxia.  Sensory exam: intact to light touch in the upper and lower extremities.  Gait, station and balance: He stands easily. No veering to one side is noted. No leaning to one side is noted. Posture is age-appropriate and stance is narrow based. Gait shows normal stride length and normal pace. No problems turning are noted.   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 I had a long chat with the patient about my findings and the diagnosis of OSA, its prognosis and treatment options. We talked about medical treatments, surgical interventions and non-pharmacological approaches. I explained in particular the risks and ramifications of untreated moderate to severe OSA, especially with respect to developing cardiovascular disease down the Road,  including congestive heart failure, difficult to treat hypertension, cardiac arrhythmias, or stroke. Even type 2 diabetes has, in part, been linked to untreated OSA. Symptoms of untreated OSA include daytime sleepiness, memory problems, mood irritability and mood disorder such as depression and anxiety, lack of energy, as well as recurrent headaches, especially morning headaches. We talked about trying to maintain a healthy lifestyle in general, as well as the importance of weight control. We also talked  about the importance of good sleep hygiene. I recommended the following at this time: sleep study.  He is advised to FU with his primary/stroke neurologist and NP as scheduled.  I explained the sleep test procedure to the patient and also outlined possible surgical and non-surgical treatment options of OSA, including the use of a custom-made dental device (which would require a referral to a specialist dentist or oral surgeon), upper airway surgical options, such as traditional UPPP or a novel less invasive surgical option in the form of Inspire hypoglossal nerve stimulation (which would involve a referral to an ENT surgeon). I also explained the CPAP treatment option to the patient, who indicated that he would be willing to try CPAP if the need arises. I explained the importance of being compliant with PAP treatment, not only for insurance purposes but primarily to improve His symptoms, and for the patient's long term health benefit, including to reduce His cardiovascular risks. I answered all his questions today and the patient was in agreement. I plan to see him back after the sleep study is completed and encouraged him to call with any interim questions, concerns, problems or updates.   Thank you very much for allowing me to participate in the care of this nice patient. If I can be of any further assistance to you please do not hesitate to call me at [phone].  Sincerely,   Star Age, MD,  PhD

## 2020-05-05 NOTE — Patient Instructions (Signed)

## 2020-05-06 ENCOUNTER — Ambulatory Visit: Payer: BC Managed Care – PPO | Admitting: Rehabilitation

## 2020-05-06 ENCOUNTER — Telehealth: Payer: Self-pay

## 2020-05-06 NOTE — Telephone Encounter (Signed)
LVM for pt to call me back to schedule sleep study  

## 2020-05-12 ENCOUNTER — Ambulatory Visit: Payer: BC Managed Care – PPO | Attending: Family Medicine | Admitting: Occupational Therapy

## 2020-05-12 ENCOUNTER — Other Ambulatory Visit: Payer: Self-pay

## 2020-05-12 DIAGNOSIS — R2689 Other abnormalities of gait and mobility: Secondary | ICD-10-CM | POA: Diagnosis not present

## 2020-05-12 DIAGNOSIS — M6281 Muscle weakness (generalized): Secondary | ICD-10-CM | POA: Diagnosis not present

## 2020-05-12 DIAGNOSIS — R278 Other lack of coordination: Secondary | ICD-10-CM | POA: Diagnosis not present

## 2020-05-12 DIAGNOSIS — R2681 Unsteadiness on feet: Secondary | ICD-10-CM

## 2020-05-12 DIAGNOSIS — R42 Dizziness and giddiness: Secondary | ICD-10-CM | POA: Insufficient documentation

## 2020-05-12 NOTE — Therapy (Signed)
[facility] [address] [place], [place], [address] Phone: [phone]   Fax:  [phone]  Occupational Therapy Evaluation  Patient Details  Name: Carlos Phillips MRN: [id number] Date of Birth: 30-Jan-1966 Referring Provider (OT): Dr. [name] - PCP   Encounter Date: 05/12/2020   OT End of Session - 05/12/20 1222    Visit Number 1    Number of Visits 6    Date for OT Re-Evaluation 06/24/20    Authorization Type BC/BS - [employer], VL: 30 combined    OT Start Time 0930    OT Stop Time 1010    OT Time Calculation (min) 40 min    Activity Tolerance Patient tolerated treatment well    Behavior During Therapy [facility] for tasks assessed/performed           Past Medical History:  Diagnosis Date  . Diabetes mellitus without complication ([place])   . DVT (deep venous thrombosis) ([place]) 02/2013  . Erectile dysfunction   . Hyperlipidemia   . Hypertension   . Obesity    BMI >52  . PVD (peripheral vascular disease) ([place])     Past Surgical History:  Procedure Laterality Date  . ABDOMINAL AORTAGRAM N/A 02/28/2013   Procedure: ABDOMINAL Maxcine Ham;  Surgeon: Conrad Latham, MD;  [facility] CATH LAB;  Service: Cardiovascular;  Laterality: N/A;  . COLONOSCOPY WITH PROPOFOL N/A 12/25/2015   Procedure: COLONOSCOPY WITH PROPOFOL;  Surgeon: Manus Gunning, MD;  Location: [facility] ENDOSCOPY;  Service: Gastroenterology;  Laterality: N/A;  . EMBOLECTOMY Left 02/25/2013   Procedure: Left Popliteal EMBOLECTOMY Poss fasciotomy;  Surgeon: Elam Dutch, MD;  Location[facility] OR;  Service: Vascular;  Laterality: Left;  Left poplital and Tibial embolectomy with four compartment Fasciotomy with vein patch angioplasty left popliteal artery.    There were no vitals filed for this visit.   Subjective Assessment - 05/12/20 0927    Subjective  If I stand up too quick or turn my head suddenly, I get light headed    Pertinent History MRI of  brain showed small acute infarct in the midbrain in the expected region of the Rt oculomotor nucleus as well as a small remote lacunar infarct in the Rt thalamus 03/24/20.  PMH includes:  PVD, obesity, HTN, DVT, DM    Limitations no driving    Currently in Pain? No/denies             [facility] OT Assessment - 05/12/20 0001      Assessment   Medical Diagnosis midbrain infarct   region of Rt oculomotor nucleus (causing diplopia initially)   Referring Provider (OT) Dr. [name] - PCP    Onset Date/Surgical Date 03/24/20    Hand Dominance Right      Precautions   Precaution Comments No driving      Balance Screen   Has the patient fallen in the past 6 months No    Has the patient had a decrease in activity level because of a fear of falling?  No    Is the patient reluctant to leave their home because of a fear of falling?  No      Home  Environment   Writer;Door    Additional Comments Pt lives in 2 story home w/ 1 step to enter, bedroom/bathroom on 2nd floor, 1/2 bath on first floor.    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 [place] watch sports/movies, cook      ADL   Eating/Feeding Independent    Grooming Independent    Designer, television/film set - Social research officer, government -  Product/process development scientist Independent      IADL   Shopping Needs to be accompanied on any shopping trip   frequent rests required, leans on cart, doesn't stay too long   Light Housekeeping Performs light daily tasks such as dishwashing, bed making   more frequent rest breaks required (vaccuming w/ rest breaks, gets  winded)   Meal Prep Plans, prepares and serves adequate meals independently   but requires frequent rest breaks   Community Mobility Relies on family or friends for transportation    Medication Management Is responsible for taking medication in correct dosages at correct time      Mobility   Mobility Status Independent      Written Expression   Dominant Hand Right    Handwriting 100% legible      Vision - History   Baseline Vision Wears glasses only for reading      Vision Assessment   Eye Alignment Within Functional Limits    Ocular Range of Motion Within Functional Limits    Tracking/Visual Pursuits Able to track stimulus in all quads without difficulty    Convergence Within functional limits   reports slightly blurry with convergence but denies diplopia   Visual Fields No apparent deficits    Comment sun/glare bothers him, diplopia has resolved      Activity Tolerance   Activity Tolerance --   Pt reports needing frequent rest breaks, gets winded, and often light headed if standing up too fast or turning head too fast     Cognition   Overall Cognitive Status Within Functional Limits for tasks assessed      Observation/Other Assessments   Observations frequent rest breaks, gets winded, sun/glare bother you      Sensation   Light Touch Appears Intact      Coordination   9 Hole Peg Test Right;Left    Right 9 Hole Peg Test 27.32 sec    Left 9 Hole Peg Test 24.87 sec      Edema   Edema none      ROM / Strength   AROM / PROM / Strength AROM;Strength      AROM   Overall AROM Comments BUE AROM WNL's      Strength   Overall Strength Comments BUE MMT 5/5 grossly      Hand Function   Right Hand Grip (lbs) 112.2 lbs    Left Hand Grip (lbs) 96.7 lbs                                OT Long Term Goals - 05/12/20 1228      OT LONG TERM GOAL #1   Title Independent with high level coordination HEP    Time 6    Period Weeks    Status New      OT  LONG TERM GOAL #2   Title Pt to  perform environmental scanning with physical tasks at 88% accuracy or greater in prep for return to driving/work tasks    Time 6    Period Weeks    Status New      OT LONG TERM GOAL #3   Title Pt to stand for 15 min or > w/o rest to complete IADL tasks safely    Time 6    Period Weeks    Status New      OT LONG TERM GOAL #4   Title Pt to verbalize understanding with possible visual adaptive devices and modifications to help reduce glare    Time 6    Period Weeks    Status New                 Plan - 05/12/20 1224    Clinical Impression Statement This 55 y.o. male presents to [facility] for evaluation s/p small acute infarct in the midbrain in the expected region of the Rt oculomotor nucleus as well as a small remote lacunar infarct in the Rt thalamus.  PMH includes: PVD, obesity, HTN, DVT, DM. Pt originally had diplopia but reports this has fully resolved, but biggest deficit/complaint at this time is decreased endurance requiring frequent rest breaks, light headedness (especially standing up too fast, or quick head turns), and glare or bright sunlight bothering him. Pt also has very mild coordination deficits. Pt would benefit from O.T. to address concerns of endurance and standing tolerance for IADLS and driving simulated activities for return to work, as well as addressing possible needs for high level coordination and remaining visual deficits.    OT Occupational Profile and History Problem Focused Assessment - Including review of records relating to presenting problem    Occupational performance deficits (Please refer to evaluation for details): IADL's;Work;Leisure    Body Structure / Function / Physical Skills Endurance;IADL;Vision;Coordination;FMC;Decreased knowledge of use of DME;Body mechanics    Rehab Potential Good    Clinical Decision Making Limited treatment options, no task modification necessary    Comorbidities Affecting Occupational  Performance: May have comorbidities impacting occupational performance    Modification or Assistance to Complete Evaluation  No modification of tasks or assist necessary to complete eval    OT Frequency 1x / week    OT Duration 6 weeks    OT Treatment/Interventions Self-care/ADL training;Therapeutic activities;Therapeutic exercise;Neuromuscular education;Functional Mobility Training;Visual/perceptual remediation/compensation;Patient/family education    Plan begin addressing goals    Consulted and Agree with Plan of Care Patient           Patient will benefit from skilled therapeutic intervention in order to improve the following deficits and impairments:   Body Structure / Function / Physical Skills: Endurance,IADL,Vision,Coordination,FMC,Decreased knowledge of use of DME,Body mechanics       Visit Diagnosis: Muscle weakness (generalized)  Unsteadiness on feet  Other lack of coordination    Problem List Patient Active Problem List   Diagnosis Date Noted  . Diplopia   . Ophthalmoplegia of right eye   . CVA (cerebral vascular accident) ([place]) 03/24/2020  . Acute CVA (cerebrovascular accident) ([place]) 03/24/2020  . Hyperlipidemia 12/31/2016  . PVD (peripheral vascular disease) ([place]) 01/29/2016  . Diverticulitis 01/28/2016  . Special screening for malignant neoplasms, colon   . Type 2 diabetes mellitus with diabetic dermatitis, without long-term current use of insulin ([place]) 05/03/2015  . History of deep vein thrombosis (DVT) of lower extremity 03/07/2013  . Ischemia of extremity 02/25/2013  . HTN (hypertension) 05/07/2011  . Erectile dysfunction 05/07/2011  .  Obesity 05/07/2011    Carey Bullocks, OTR/L 05/12/2020, 3:16 PM  [facility] [address] [place], [place], [address] Phone: [phone]   Fax:  [phone]  Name: BENOIT MEECH MRN: [id number] Date of Birth: 07/12/1965

## 2020-05-13 ENCOUNTER — Ambulatory Visit: Payer: BC Managed Care – PPO | Admitting: Physical Therapy

## 2020-05-13 ENCOUNTER — Encounter: Payer: Self-pay | Admitting: Physical Therapy

## 2020-05-13 VITALS — BP 125/94 | HR 90

## 2020-05-13 DIAGNOSIS — R278 Other lack of coordination: Secondary | ICD-10-CM | POA: Diagnosis not present

## 2020-05-13 DIAGNOSIS — R42 Dizziness and giddiness: Secondary | ICD-10-CM

## 2020-05-13 DIAGNOSIS — M6281 Muscle weakness (generalized): Secondary | ICD-10-CM

## 2020-05-13 DIAGNOSIS — R2681 Unsteadiness on feet: Secondary | ICD-10-CM

## 2020-05-13 DIAGNOSIS — R2689 Other abnormalities of gait and mobility: Secondary | ICD-10-CM | POA: Diagnosis not present

## 2020-05-13 NOTE — Addendum Note (Signed)
Addended by: Arliss Journey on: 05/13/2020 04:44 PM   Modules accepted: Orders

## 2020-05-13 NOTE — Therapy (Addendum)
[facility] [address] [place], [place], [address] Phone: [phone]   Fax:  [phone]  Physical Therapy Evaluation  Patient Details  Name: Carlos Phillips MRN: [id number] Date of Birth: 12-28-1965 Referring Provider (PT): Frann Rider (being followed by, referred by hospitalist)   Encounter Date: 05/13/2020   PT End of Session - 05/13/20 1425    Visit Number 1    Number of Visits 13    Authorization Type BCBS - VL: 30 PT/OT/ST combined - each visit of each type counts    Authorization - Number of Visits 30    PT Start Time 0845    PT Stop Time 0930    PT Time Calculation (min) 45 min    Equipment Utilized During Treatment Gait belt    Activity Tolerance Patient tolerated treatment well    Behavior During Therapy WFL for tasks assessed/performed           Past Medical History:  Diagnosis Date  . Diabetes mellitus without complication ([place])   . DVT (deep venous thrombosis) ([place]) 02/2013  . Erectile dysfunction   . Hyperlipidemia   . Hypertension   . Obesity    BMI >52  . PVD (peripheral vascular disease) ([place])     Past Surgical History:  Procedure Laterality Date  . ABDOMINAL AORTAGRAM N/A 02/28/2013   Procedure: ABDOMINAL Maxcine Ham;  Surgeon: Conrad [place], MD;  Location: [facility] C[facility] LAB;  Service: Cardiovascular;  Laterality: N/A;  . COLONOSCOPY WITH PROPOFOL N/A 12/25/2015   Procedure: COLONOSCOPY WITH PROPOFOL;  Surgeon: Manus Gunning, MD;  Location: [facility] ENDOSCOPY;  Service: Gastroenterology;  Laterality: N/A;  . EMBOLECTOMY Left 02/25/2013   Procedure: Left Popliteal EMBOLECTOMY Poss fasciotomy;  Surgeon: Elam Dutch, MD;  Location: [facility];  Service: Vascular;  Laterality: Left;  Left poplital and Tibial embolectomy with four compartment Fasciotomy with vein patch angioplasty left popliteal artery.    Vitals:   05/13/20 0858 05/13/20 0859 05/13/20 0906  BP: 121/75 (!) 134/97 (!) 125/94   Pulse: 71 90       Subjective Assessment - 05/13/20 0848    Subjective admitted with c/o binocular diplopia on 03/24/20, discharged on 03/26/20.   MRI of brain showed small acute infarct in the midbrain in the expected region of the Rt oculomotor nucleus as well as a small remote lacunar infarct in the Rt thalamus. No longer having double vision (went away at end of Jan). After he left the hospital, was staying in bed for a couple of weeks. Reports when he stands up and turning his head he feels lightheaded and feels like his eyes want to cross. Reports it is better when he takes his time. States that after 20-30 mins after standing and doing activity, gets a little winded and needing to sit down. Reports that he felt wobbly yesterday when trying to go up and down the stairs yesterday. Bright light is bothersome and needs to have sunglasses. When looking out the window when in the car and objects going by quickly is bothersome and hurts his head, but it is getting better.    Pertinent History PVD, obesity, HTN, DVT, DM    Limitations Walking    How long can you stand comfortably? 10-15 minutes after standing up in the kitchen    Diagnostic tests MRI of brain showed small acute infarct in the midbrain in the expected region of the Rt oculomotor nucleus as well as a small remote lacunar infarct in the  Rt thalamus.    Patient Stated Goals wants to improve the balance    Currently in Pain? No/denies   when going outside and it is too bright, his head will start hurting             [facility] PT Assessment - 05/13/20 0908      Assessment   Medical Diagnosis midbrain infarct   region of Rt oculomotor nucleus (causing diplopia initially)   Referring Provider (PT) Frann Rider   being followed by, referred by hospitalist   Onset Date/Surgical Date 03/24/20    Prior Therapy no prior PT      Precautions   Precaution Comments No driving      Balance Screen   Has the patient fallen in the past 6  months No    Has the patient had a decrease in activity level because of a fear of falling?  No    Is the patient reluctant to leave their home because of a fear of falling?  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 [place] watch sports/movies, cook      Sensation   Light Touch Appears Intact      Coordination   Gross Motor Movements are Fluid and Coordinated Yes      ROM / Strength   AROM / PROM / Strength Strength      Strength   Strength Assessment Site Hip;Knee;Ankle    Right/Left Hip Left;Right    Right Hip Flexion 5/5    Left Hip Flexion 4+/5    Right/Left Knee Right;Left    Right Knee Flexion 4+/5    Right Knee Extension 5/5    Left Knee Flexion 4/5    Left Knee Extension 5/5    Right/Left Ankle Right;Left    Right Ankle Dorsiflexion 5/5    Left Ankle Dorsiflexion 5/5      Special Tests   Other special tests perfomed VOR x1 in seated in horizontal and vertical directions x10 reps each direction with incr speed with pt reporting no dizziness      Transfers   Transfers Stand to Sit;Sit to Stand    Sit to Stand 5: Supervision;With upper extremity assist;From chair/3-in-1    Five time sit to stand comments  34.75 with BUE support from standard height chair    Stand to Sit 5: Supervision;With upper extremity assist;To chair/3-in-1    Comments felt a little "woozy" afterwards,closed his eyes for a second and  sensation went away when in sitting      Ambulation/Gait   Ambulation/Gait Yes    Ambulation/Gait Assistance 5: Supervision;4: Min guard    Ambulation/Gait Assistance Details gait with head nods/head turns and 3 reps of 180 degree turns, pt reports feeling "funny" and off balance and felt like he needed to grab  something to get his balance back - min guard as needed for safety. most difficulty with head nods    Ambulation Distance (Feet) --   approx 100-115   Assistive device None    Gait Pattern Step-through pattern    Ambulation Surface Level;Indoor    Gait velocity 11.28 seconds = 2.90 ft/sec      High Level Balance   High Level Balance Comments --                      Objective measurements completed on examination: See above findings.               PT Education - 05/13/20 1424    Education Details clinical findings, POC, pt to have further vestibular assessment    Person(s) Educated Patient    Methods Explanation    Comprehension Verbalized understanding            PT Short Term Goals - 05/13/20 1524      PT SHORT TERM GOAL #1   Title Pt will be independent with initial HEP in order to build upon functional gains made in therapy. ALL STGS DUE 06/03/20    Time 3    Period Weeks    Status New    Target Date 06/03/20      PT SHORT TERM GOAL #2   Title Pt will undergo further vestibular assessment with LTG to be written as appropriate.    Time 3    Period Weeks    Status New    Target Date 06/03/20      PT SHORT TERM GOAL #3   Title Pt will undergo DGI/FGA with LTG as appropriate to demo improved balance and decr fall risk.    Time 3    Period Weeks    Status New      PT SHORT TERM GOAL #4   Title Pt will improve 5x sit <> stand time to at least 29 seconds or less with BUE support in order to demo improved functional BLE strength.    Baseline 34.75 seconds    Time 3    Period Weeks    Status New             PT Long Term Goals -  05/13/20 1526      PT LONG TERM GOAL #1   Title Pt will be independent with final HEP in order to build upon functional gains made in therapy.    Time 6    Period Weeks    Status New    Target Date 06/24/20      PT LONG TERM GOAL #2   Title Vestibular goal to be written as appropriate based on vestibular assessment.    Time 6    Period Weeks    Status New      PT LONG TERM GOAL #3   Title FGA/DGI goal to be written as appropriate in order to demo improved balance/decr fall risk.    Time 6    Period Weeks    Status New      PT LONG TERM GOAL #4   Title Pt will ambulate at least 230' with supervision with scanning environment with no reports of feeling dizziness/off balance in order to demo improved functional mobility.    Time 6    Period Weeks    Status New      PT LONG TERM GOAL #5   Title Pt will improve 5x sit <>  stand time to at least 25 seconds or less with BUE support and RPE less than 4/10 in order to demo improved functional BLE strength.    Baseline 34.75 seconds    Time 6    Period Weeks    Status New                  Plan - 05/13/20 1528    Clinical Impression Statement Pt is a 55 y.o. male presents to OPPT for evaluation s/p small acute infarct in the midbrain in the expected region of the Rt oculomotor nucleus as well as a small remote lacunar infarct in the Rt thalamus.  Pt hospitalized on 03/24/20 and discharged on 03/26/20. Pt reports diplopia has resolved but biggest compliants are decr endurance (requiring frequent rest breaks), and feelings of unsteadiness (with head motions and standing up too quickly). PMH includes: PVD, obesity, HTN, DVT, DM. No orthostatics noted today from sitting > standing, pt with an incr in BP while standing. The following deficits were present during today's exam: decr endurance, impaired balance, gait abnormalities, unsteadiness with turns/head motions, decr strength. Did not have time to assess further balance testing for  fall risk due to time constraints today. Pt also reporting the sensation of his head hurting when looking out the window of the car and seeing objects go by. Pt will benefit from a further thorough vestibular assessment.  Pt would benefit from skilled PT to address these impairments and functional limitations to maximize functional mobility independence    Personal Factors and Comorbidities Comorbidity 3+;Profession;Past/Current Experience    Comorbidities PVD, obesity, HTN, DVT, DM    Examination-Activity Limitations Squat;Transfers;Stairs;Locomotion Level;Stand;Sit    Examination-Participation Restrictions Community Activity;Occupation;Cleaning    Stability/Clinical Decision Making Evolving/Moderate complexity    Clinical Decision Making Moderate    Rehab Potential Good    PT Frequency 2x / week    PT Duration 6 weeks    PT Treatment/Interventions ADLs/Self Care Home Management;Canalith Repostioning;Gait training;Stair training;Therapeutic activities;Functional mobility training;Therapeutic exercise;Balance training;Neuromuscular re-education;Patient/family education;Vasopneumatic Device;Vestibular    PT Next Visit Plan perform more thorough vestibular assessment. monitor BP. perform FGA/DGI. give pt HEP for balance/vestib/general strengthening.    Consulted and Agree with Plan of Care Patient           Patient will benefit from skilled therapeutic intervention in order to improve the following deficits and impairments:  Abnormal gait,Decreased balance,Decreased activity tolerance,Decreased endurance,Decreased strength,Difficulty walking,Dizziness,Obesity  Visit Diagnosis: Unsteadiness on feet  Muscle weakness (generalized)  Dizziness and giddiness  Other abnormalities of gait and mobility     Problem List Patient Active Problem List   Diagnosis Date Noted  . Diplopia   . Ophthalmoplegia of right eye   . CVA (cerebral vascular accident) ([place]) 03/24/2020  . Acute CVA  (cerebrovascular accident) ([place]) 03/24/2020  . Hyperlipidemia 12/31/2016  . PVD (peripheral vascular disease) ([place]) 01/29/2016  . Diverticulitis 01/28/2016  . Special screening for malignant neoplasms, colon   . Type 2 diabetes mellitus with diabetic dermatitis, without long-term current use of insulin ([place]) 05/03/2015  . History of deep vein thrombosis (DVT) of lower extremity 03/07/2013  . Ischemia of extremity 02/25/2013  . HTN (hypertension) 05/07/2011  . Erectile dysfunction 05/07/2011  . Obesity 05/07/2011    Arliss Journey, PT, DPT  05/13/2020, 4:43 PM  [facility] [address] [place], [place], [address] Phone: [phone]   Fax:  [phone]  Name: Carlos Phillips MRN: [id number] Date of Birth: 01-May-1965

## 2020-05-19 ENCOUNTER — Ambulatory Visit: Payer: BC Managed Care – PPO

## 2020-05-19 ENCOUNTER — Other Ambulatory Visit: Payer: Self-pay

## 2020-05-19 ENCOUNTER — Telehealth: Payer: Self-pay

## 2020-05-19 DIAGNOSIS — R2689 Other abnormalities of gait and mobility: Secondary | ICD-10-CM

## 2020-05-19 DIAGNOSIS — R42 Dizziness and giddiness: Secondary | ICD-10-CM | POA: Diagnosis not present

## 2020-05-19 DIAGNOSIS — R2681 Unsteadiness on feet: Secondary | ICD-10-CM | POA: Diagnosis not present

## 2020-05-19 DIAGNOSIS — R278 Other lack of coordination: Secondary | ICD-10-CM | POA: Diagnosis not present

## 2020-05-19 DIAGNOSIS — M6281 Muscle weakness (generalized): Secondary | ICD-10-CM

## 2020-05-19 NOTE — Telephone Encounter (Signed)
LVM for pt to call me back to schedule sleep study  

## 2020-05-19 NOTE — Therapy (Signed)
[facility] [address] [place], [place], [address] Phone: [phone]   Fax:  [phone]  Physical Therapy Treatment  Patient Details  Name: Carlos Phillips MRN: [id number] Date of Birth: 1965/11/05 Referring Provider (PT): Frann Rider (being followed by, referred by hospitalist)   Encounter Date: 05/19/2020   PT End of Session - 05/19/20 0807    Visit Number 2    Number of Visits 13    Authorization Type BCBS - VL: 30 PT/OT/ST combined - each visit of each type counts    Authorization - Number of Visits 30    PT Start Time 0800    PT Stop Time 0845    PT Time Calculation (min) 45 min    Equipment Utilized During Treatment Gait belt    Activity Tolerance Patient tolerated treatment well    Behavior During Therapy [facility] for tasks assessed/performed           Past Medical History:  Diagnosis Date  . Diabetes mellitus without complication ([place])   . DVT (deep venous thrombosis) ([place]) 02/2013  . Erectile dysfunction   . Hyperlipidemia   . Hypertension   . Obesity    BMI >52  . PVD (peripheral vascular disease) ([place])     Past Surgical History:  Procedure Laterality Date  . ABDOMINAL AORTAGRAM N/A 02/28/2013   Procedure: ABDOMINAL Maxcine Ham;  Surgeon: Conrad [place], MD;  Location: [facility] CATH LAB;  Service: Cardiovascular;  Laterality: N/A;  . COLONOSCOPY WITH PROPOFOL N/A 12/25/2015   Procedure: COLONOSCOPY WITH PROPOFOL;  Surgeon: Manus Gunning, MD;  Location: [facility] ENDOSCOPY;  Service: Gastroenterology;  Laterality: N/A;  . EMBOLECTOMY Left 02/25/2013   Procedure: Left Popliteal EMBOLECTOMY Poss fasciotomy;  Surgeon: Elam Dutch, MD;  Location: [facility] OR;  Service: Vascular;  Laterality: Left;  Left poplital and Tibial embolectomy with four compartment Fasciotomy with vein patch angioplasty left popliteal artery.    There were no vitals filed for this visit.    BP sitting 145/183m 75bpm  BP standing  143/101, 83bpm  Sit to stand: 10x no weight, 10x with 20lb KB Four square step: 5x cw and ccw each direction, pt felt "I am going to fall" when stepping backwards with stepping with either leg, pt didn't need any assistance but he just felt more insecure.  FGA performed 26/30  Standing pivot turns: 3x each direction   Pt education: pt educated on measuring BP before he takes meds and couple of hours afterwards and keeping a log of it. Presenting that log to MD. Discussed on controlling    [facility] PT Assessment - 05/19/20 0819      Functional Gait  Assessment   Gait Level Surface Walks 20 ft in less than 5.5 sec, no assistive devices, good speed, no evidence for imbalance, normal gait pattern, deviates no more than 6 in outside of the 12 in walkway width.    Change in Gait Speed Able to smoothly change walking speed without loss of balance or gait deviation. Deviate no more than 6 in outside of the 12 in walkway width.    Gait with Horizontal Head Turns Performs head turns smoothly with no change in gait. Deviates no more than 6 in outside 12 in walkway width    Gait with Vertical Head Turns Performs task with slight change in gait velocity (eg, minor disruption to smooth gait path), deviates 6 - 10 in outside 12 in walkway width or uses assistive device    Gait  and Pivot Turn Cannot turn safely, requires assistance to turn and stop.    Step Over Obstacle Is able to step over 2 stacked shoe boxes taped together (9 in total height) without changing gait speed. No evidence of imbalance.    Gait with Narrow Base of Support Is able to ambulate for 10 steps heel to toe with no staggering.    Gait with Eyes Closed Walks 20 ft, no assistive devices, good speed, no evidence of imbalance, normal gait pattern, deviates no more than 6 in outside 12 in walkway width. Ambulates 20 ft in less than 7 sec.    Ambulating Backwards Walks 20 ft, no assistive devices, good speed, no evidence for imbalance, normal  gait    Steps Alternating feet, no rail.    Total Score 26                                   PT Short Term Goals - 05/19/20 0848      PT SHORT TERM GOAL #1   Title Pt will be independent with initial HEP in order to build upon functional gains made in therapy. ALL STGS DUE 06/03/20    Time 3    Period Weeks    Status New    Target Date 06/03/20      PT SHORT TERM GOAL #2   Title Pt will undergo further vestibular assessment with LTG to be written as appropriate.    Time 3    Period Weeks    Status New    Target Date 06/03/20      PT SHORT TERM GOAL #3   Title Pt will undergo DGI/FGA with LTG as appropriate to demo improved balance and decr fall risk.    Baseline LTG written (05/19/20)    Time 3    Period Weeks    Status Achieved      PT SHORT TERM GOAL #4   Title Pt will improve 5x sit <> stand time to at least 29 seconds or less with BUE support in order to demo improved functional BLE strength.    Baseline 34.75 seconds    Time 3    Period Weeks    Status New             PT Long Term Goals - 05/19/20 0847      PT LONG TERM GOAL #1   Title Pt will be independent with final HEP in order to build upon functional gains made in therapy.    Time 6    Period Weeks    Status New      PT LONG TERM GOAL #2   Title Vestibular goal to be written as appropriate based on vestibular assessment.    Time 6    Period Weeks    Status New      PT LONG TERM GOAL #3   Title Pt will score 30/30 on FGA to improve functional gait and balance.    Baseline 26/30 (05/19/20)    Time 6    Period Weeks    Status New      PT LONG TERM GOAL #4   Title Pt will ambulate at least 230' with supervision with scanning environment with no reports of feeling dizziness/off balance in order to demo improved functional mobility.    Time 6    Period Weeks    Status New      PT  LONG TERM GOAL #5   Title Pt will improve 5x sit <> stand time to at least 25 seconds or less  with BUE support and RPE less than 4/10 in order to demo improved functional BLE strength.    Baseline 34.75 seconds    Time 6    Period Weeks    Status New                 Plan - 05/19/20 3546    Clinical Impression Statement Pt scored 26/30 on FGA with most difficulty with quick pivot turns and mild difficulty with looking up and down with walking. Pt reported no dizziness with sit to stand today. Pt does have high BP and was educated to check it often and talk to MD for better management of BP.    Personal Factors and Comorbidities Comorbidity 3+;Profession;Past/Current Experience    Comorbidities PVD, obesity, HTN, DVT, DM    Examination-Activity Limitations Squat;Transfers;Stairs;Locomotion Level;Stand;Sit    Examination-Participation Restrictions Community Activity;Occupation;Cleaning    Stability/Clinical Decision Making Evolving/Moderate complexity    Rehab Potential Good    PT Frequency 2x / week    PT Duration 6 weeks    PT Treatment/Interventions ADLs/Self Care Home Management;Canalith Repostioning;Gait training;Stair training;Therapeutic activities;Functional mobility training;Therapeutic exercise;Balance training;Neuromuscular re-education;Patient/family education;Vasopneumatic Device;Vestibular    PT Next Visit Plan Work on quick pivot turns, gait with vertical head turns, stepping backwards over obstacles, sit to stand    PT Home Exercise Plan Access Code: [facility]  URL: [email or web address]  Date: 05/19/2020  Prepared by: Markus Jarvis    Exercises  Sit to Stand with Arms Crossed - 1 x daily - 7 x weekly - 2 sets - 10 reps  Standing with half turns-Quick - 1 x daily - 7 x weekly - 1 sets - 3-5 reps    Consulted and Agree with Plan of Care Patient           Patient will benefit from skilled therapeutic intervention in order to improve the following deficits and impairments:  Abnormal gait,Decreased balance,Decreased activity tolerance,Decreased  endurance,Decreased strength,Difficulty walking,Dizziness,Obesity  Visit Diagnosis: Unsteadiness on feet  Muscle weakness (generalized)  Dizziness and giddiness  Other abnormalities of gait and mobility     Problem List Patient Active Problem List   Diagnosis Date Noted  . Diplopia   . Ophthalmoplegia of right eye   . CVA (cerebral vascular accident) ([place]) 03/24/2020  . Acute CVA (cerebrovascular accident) ([place]) 03/24/2020  . Hyperlipidemia 12/31/2016  . PVD (peripheral vascular disease) ([place]) 01/29/2016  . Diverticulitis 01/28/2016  . Special screening for malignant neoplasms, colon   . Type 2 diabetes mellitus with diabetic dermatitis, without long-term current use of insulin ([place]) 05/03/2015  . History of deep vein thrombosis (DVT) of lower extremity 03/07/2013  . Ischemia of extremity 02/25/2013  . HTN (hypertension) 05/07/2011  . Erectile dysfunction 05/07/2011  . Obesity 05/07/2011    Kerrie Pleasure, PT 05/19/2020, 8:49 AM  [facility] [address] [place], [place], [address] Phone: [phone]   Fax:  [phone]  Name: MUNEER LEIDER MRN: [id number] Date of Birth: 1965/11/04

## 2020-05-21 ENCOUNTER — Ambulatory Visit: Payer: BC Managed Care – PPO | Admitting: Physical Therapy

## 2020-05-25 ENCOUNTER — Telehealth: Payer: Self-pay

## 2020-05-25 NOTE — Telephone Encounter (Signed)
We have attempted to call the patient two times to schedule sleep study.  Patient has been unavailable at the phone numbers we have on file and has not returned our calls. If patient calls back we will schedule them for their sleep study.  

## 2020-05-26 ENCOUNTER — Ambulatory Visit: Payer: BC Managed Care – PPO | Attending: Family Medicine | Admitting: Physical Therapy

## 2020-05-26 ENCOUNTER — Other Ambulatory Visit: Payer: Self-pay

## 2020-05-26 ENCOUNTER — Encounter: Payer: Self-pay | Admitting: Physical Therapy

## 2020-05-26 VITALS — BP 137/95 | HR 74

## 2020-05-26 DIAGNOSIS — M6281 Muscle weakness (generalized): Secondary | ICD-10-CM | POA: Diagnosis not present

## 2020-05-26 DIAGNOSIS — R278 Other lack of coordination: Secondary | ICD-10-CM | POA: Diagnosis not present

## 2020-05-26 DIAGNOSIS — R42 Dizziness and giddiness: Secondary | ICD-10-CM | POA: Insufficient documentation

## 2020-05-26 DIAGNOSIS — R2689 Other abnormalities of gait and mobility: Secondary | ICD-10-CM | POA: Insufficient documentation

## 2020-05-26 DIAGNOSIS — R2681 Unsteadiness on feet: Secondary | ICD-10-CM | POA: Insufficient documentation

## 2020-05-26 NOTE — Patient Instructions (Signed)
Access Code: [facility] URL: [email or web address] Date: 05/26/2020 Prepared by: Janann August  Exercises Romberg Stance Eyes Closed on Foam Pad - 1-2 x daily - 5 x weekly - 3 sets - 30 hold Romberg Stance with Head Nods on Foam Pad - 1-2 x daily - 5 x weekly - 2 sets - 10 reps Walking Forward with Slow Head Nods - 1-2 x daily - 5 x weekly - 2 sets - 2 reps

## 2020-05-26 NOTE — Therapy (Signed)
[facility] [address] [place], [place], [address] Phone: [phone]   Fax:  [phone]  Physical Therapy Treatment  Patient Details  Name: Carlos Phillips MRN: [id number] Date of Birth: [birth year] Referring Provider (PT): Frann Rider (being followed by, referred by hospitalist)   Encounter Date: 05/26/2020   PT End of Session - 05/26/20 1014    Visit Number 3    Number of Visits 13    Authorization Type BCBS - VL: 30 PT/OT/ST combined - each visit of each type counts    Authorization - Visit Number 2    Authorization - Number of Visits 30    PT Start Time [phone]    PT Stop Time 1010    PT Time Calculation (min) 42 min    Equipment Utilized During Treatment Gait belt    Activity Tolerance Patient tolerated treatment well    Behavior During Therapy [facility] for tasks assessed/performed           Past Medical History:  Diagnosis Date  . Diabetes mellitus without complication ([place])   . DVT (deep venous thrombosis) ([place]) 02/2013  . Erectile dysfunction   . Hyperlipidemia   . Hypertension   . Obesity    BMI >52  . PVD (peripheral vascular disease) ([place])     Past Surgical History:  Procedure Laterality Date  . ABDOMINAL AORTAGRAM N/A 02/28/2013   Procedure: ABDOMINAL Maxcine Ham;  Surgeon: Conrad [place]ua, MD;  Location: [facility] CATH LAB;  Service: Cardiovascular;  Laterality: N/A;  . COLONOSCOPY WITH PROPOFOL N/A 12/25/2015   Procedure: COLONOSCOPY WITH PROPOFOL;  Surgeon: Manus Gunning, MD;  Location: [facility] ENDOSCOPY;  Service: Gastroenterology;  Laterality: N/A;  . EMBOLECTOMY Left 02/25/2013   Procedure: Left Popliteal EMBOLECTOMY Poss fasciotomy;  Surgeon: Elam Dutch, MD;  Location: [facility] OR;  Service: Vascular;  Laterality: Left;  Left poplital and Tibial embolectomy with four compartment Fasciotomy with vein patch angioplasty left popliteal artery.    Vitals:   05/26/20 0932 05/26/20 0951  BP: (!) 139/92  (!) 137/95  Pulse: 71 74     Subjective Assessment - 05/26/20 0934    Subjective Reports BP has been running a little high at home. Wife has been writing it down, does not remember what it has been. Reports doing that exercises 1x a day. Turning quickly still makes him feel like he is going to fall.    Pertinent History PVD, obesity, HTN, DVT, DM    Limitations Walking    How long can you stand comfortably? 10-15 minutes after standing up in the kitchen    Diagnostic tests MRI of brain showed small acute infarct in the midbrain in the expected region of the Rt oculomotor nucleus as well as a small remote lacunar infarct in the Rt thalamus.    Patient Stated Goals wants to improve the balance    Currently in Pain? No/denies                             [facility] Adult PT Treatment/Exercise - 05/26/20 0948      Ambulation/Gait   Ambulation/Gait Yes    Ambulation/Gait Assistance 5: Supervision;4: Min guard    Ambulation/Gait Assistance Details 2 laps of gait with head turns and nods, more imbalance with head nods. Ambulates with a wider BOS.    Ambulation Distance (Feet) 230 Feet    Assistive device None    Gait Pattern Step-through pattern  Gait Comments gait down hallway with 2 quick turns to right, 2 quick turns to left, 3/10 dizziness that subsided quickly      Neuro Re-ed    Neuro Re-ed Details  at counter top: quarter turns to R/L x3 reps each direction, and then 180 degree turns, x2 reps each direction, pt reporting no dizziness when performing             Access Code: [facility] URL: [email or web address] Date: 05/26/2020 Prepared by: Janann August   New additions to pt's HEP. See MedBridge for more details.  Also has sit <> stands and quick turns.   Exercises Romberg Stance Eyes Closed on Foam Pad - 1-2 x daily - 5 x weekly - 3 sets - 30 hold - intermittent taps to the wall for balance  Romberg Stance with Head Nods on Foam Pad - 1-2 x  daily - 5 x weekly - 2 sets - 10 reps - with eyes open  Walking Forward with Slow Head Nods - 1-2 x daily - 5 x weekly - 2 sets - 2 reps -at the countertop      PT Education - 05/26/20 1012    Education Details continue to monitor BP at home and bring in log at next session, setting up an earlier appt with PCP regarding BP, balance additions to HEP    Person(s) Educated Patient    Methods Demonstration;Handout;Explanation    Comprehension Verbalized understanding;Returned demonstration            PT Short Term Goals - 05/19/20 0848      PT SHORT TERM GOAL #1   Title Pt will be independent with initial HEP in order to build upon functional gains made in therapy. ALL STGS DUE 06/03/20    Time 3    Period Weeks    Status New    Target Date 06/03/20      PT SHORT TERM GOAL #2   Title Pt will undergo further vestibular assessment with LTG to be written as appropriate.    Time 3    Period Weeks    Status New    Target Date 06/03/20      PT SHORT TERM GOAL #3   Title Pt will undergo DGI/FGA with LTG as appropriate to demo improved balance and decr fall risk.    Baseline LTG written (05/19/20)    Time 3    Period Weeks    Status Achieved      PT SHORT TERM GOAL #4   Title Pt will improve 5x sit <> stand time to at least 29 seconds or less with BUE support in order to demo improved functional BLE strength.    Baseline 34.75 seconds    Time 3    Period Weeks    Status New             PT Long Term Goals - 05/19/20 0847      PT LONG TERM GOAL #1   Title Pt will be independent with final HEP in order to build upon functional gains made in therapy.    Time 6    Period Weeks    Status New      PT LONG TERM GOAL #2   Title Vestibular goal to be written as appropriate based on vestibular assessment.    Time 6    Period Weeks    Status New      PT LONG TERM GOAL #3   Title Pt will score 30/30 on  FGA to improve functional gait and balance.    Baseline 26/30 (05/19/20)     Time 6    Period Weeks    Status New      PT LONG TERM GOAL #4   Title Pt will ambulate at least 230' with supervision with scanning environment with no reports of feeling dizziness/off balance in order to demo improved functional mobility.    Time 6    Period Weeks    Status New      PT LONG TERM GOAL #5   Title Pt will improve 5x sit <> stand time to at least 25 seconds or less with BUE support and RPE less than 4/10 in order to demo improved functional BLE strength.    Baseline 34.75 seconds    Time 6    Period Weeks    Status New                 Plan - 05/26/20 1714    Clinical Impression Statement Pt's diastolic slightly elevated, but WFL to participate in therapy today. Discussed pt continuing to monitor BP and bringing in log to next session and to reach out to PCP to see if he could get scheduled earlier to see him (his appt is in April). Focused on gait with head motions and turns, and balance with vision removed/on compliant surfaces. Pt reporting 3/10 dizziness after performing quick turns, but subsides quickly. Pt more unsteady with head nods. Will continue to progress towards LTGs.    Personal Factors and Comorbidities Comorbidity 3+;Profession;Past/Current Experience    Comorbidities PVD, obesity, HTN, DVT, DM    Examination-Activity Limitations Squat;Transfers;Stairs;Locomotion Level;Stand;Sit    Examination-Participation Restrictions Community Activity;Occupation;Cleaning    Stability/Clinical Decision Making Evolving/Moderate complexity    Rehab Potential Good    PT Frequency 2x / week    PT Duration 6 weeks    PT Treatment/Interventions ADLs/Self Care Home Management;Canalith Repostioning;Gait training;Stair training;Therapeutic activities;Functional mobility training;Therapeutic exercise;Balance training;Neuromuscular re-education;Patient/family education;Vasopneumatic Device;Vestibular    PT Next Visit Plan continue to monitor BP. Work on quick pivot turns,  gait and balance with vertical head turns, stepping backwards, balance with eyes closed/on compliant surfaces.    PT Home Exercise Plan other access code: [facility]         Access Code: [facility]  URL: [email or web address]  Date: 05/19/2020  Prepared by: Markus Jarvis    Exercises  Sit to Stand with Arms Crossed - 1 x daily - 7 x weekly - 2 sets - 10 reps  Standing with half turns-Quick - 1 x daily - 7 x weekly - 1 sets - 3-5 reps    Consulted and Agree with Plan of Care Patient           Patient will benefit from skilled therapeutic intervention in order to improve the following deficits and impairments:  Abnormal gait,Decreased balance,Decreased activity tolerance,Decreased endurance,Decreased strength,Difficulty walking,Dizziness,Obesity  Visit Diagnosis: Unsteadiness on feet  Muscle weakness (generalized)  Dizziness and giddiness     Problem List Patient Active Problem List   Diagnosis Date Noted  . Diplopia   . Ophthalmoplegia of right eye   . CVA (cerebral vascular accident) ([place]) 03/24/2020  . Acute CVA (cerebrovascular accident) ([place]) 03/24/2020  . Hyperlipidemia 12/31/2016  . PVD (peripheral vascular disease) ([place]) 01/29/2016  . Diverticulitis 01/28/2016  . Special screening for malignant neoplasms, colon   . Type 2 diabetes mellitus with diabetic dermatitis, without long-term current use of insulin ([place]) 05/03/2015  . History of deep vein thrombosis (DVT)  of lower extremity 03/07/2013  . Ischemia of extremity 02/25/2013  . HTN (hypertension) 05/07/2011  . Erectile dysfunction 05/07/2011  . Obesity 05/07/2011    Lillia Pauls, DPT  05/26/2020, 5:17 PM  [facility] [address] [place], [place], [address] Phone: [phone]   Fax:  [phone]  Name: PETAR MUCCI MRN: [id number] Date of Birth: 14-Apr-1965

## 2020-05-28 ENCOUNTER — Ambulatory Visit: Payer: BC Managed Care – PPO | Admitting: Physical Therapy

## 2020-06-02 ENCOUNTER — Other Ambulatory Visit: Payer: Self-pay

## 2020-06-02 ENCOUNTER — Ambulatory Visit: Payer: BC Managed Care – PPO | Admitting: Physical Therapy

## 2020-06-02 ENCOUNTER — Telehealth: Payer: Self-pay | Admitting: Family Medicine

## 2020-06-02 ENCOUNTER — Encounter: Payer: Self-pay | Admitting: Physical Therapy

## 2020-06-02 VITALS — BP 142/106 | HR 71

## 2020-06-02 DIAGNOSIS — R2681 Unsteadiness on feet: Secondary | ICD-10-CM

## 2020-06-02 DIAGNOSIS — R42 Dizziness and giddiness: Secondary | ICD-10-CM

## 2020-06-02 DIAGNOSIS — M6281 Muscle weakness (generalized): Secondary | ICD-10-CM

## 2020-06-02 NOTE — Therapy (Signed)
[facility] [address] [place], [place], [address] Phone: [phone]   Fax:  [phone]  Physical Therapy Treatment-Arrived No Charge  Patient Details  Name: Carlos Phillips MRN: [id number] Date of Birth: 01-25-1966 Referring Provider (PT): Frann Rider (being followed by, referred by hospitalist)   Encounter Date: 06/02/2020   PT End of Session - 06/02/20 1634    Visit Number 3   arrived no charge   Number of Visits 13    Authorization Type BCBS - VL: 30 PT/OT/ST combined - each visit of each type counts    Authorization - Visit Number 2    Authorization - Number of Visits 30    PT Start Time [phone]    PT Stop Time 1011    PT Time Calculation (min) 42 min    Activity Tolerance --   limited by high BP   Behavior During Therapy [facility] for tasks assessed/performed           Past Medical History:  Diagnosis Date  . Diabetes mellitus without complication ([place])   . DVT (deep venous thrombosis) ([place]) 02/2013  . Erectile dysfunction   . Hyperlipidemia   . Hypertension   . Obesity    BMI >52  . PVD (peripheral vascular disease) ([place])     Past Surgical History:  Procedure Laterality Date  . ABDOMINAL AORTAGRAM N/A 02/28/2013   Procedure: ABDOMINAL Maxcine Ham;  Surgeon: Conrad Theodosia, MD;  Location: [facility] CATH LAB;  Service: Cardiovascular;  Laterality: N/A;  . COLONOSCOPY WITH PROPOFOL N/A 12/25/2015   Procedure: COLONOSCOPY WITH PROPOFOL;  Surgeon: Manus Gunning, MD;  Location: [facility] ENDOSCOPY;  Service: Gastroenterology;  Laterality: N/A;  . EMBOLECTOMY Left 02/25/2013   Procedure: Left Popliteal EMBOLECTOMY Poss fasciotomy;  Surgeon: Elam Dutch, MD;  Location: [facility] OR;  Service: Vascular;  Laterality: Left;  Left poplital and Tibial embolectomy with four compartment Fasciotomy with vein patch angioplasty left popliteal artery.    Vitals:   06/02/20 0938 06/02/20 0944 06/02/20 0956  BP: (!) 144/100 (!)  149/100 (!) 142/106  Pulse: 69 69 71     Subjective Assessment - 06/02/20 0931    Subjective Reports sometimes that when he does his exercises he feels nauseous afterwards and feels like he could throw up. Then a minute later he feels back to normal. Takes pills for that and then he feels like it goes away. Reports dizziness feels like it is getting better.    Pertinent History PVD, obesity, HTN, DVT, DM    Limitations Walking    How long can you stand comfortably? 10-15 minutes after standing up in the kitchen    Diagnostic tests MRI of brain showed small acute infarct in the midbrain in the expected region of the Rt oculomotor nucleus as well as a small remote lacunar infarct in the Rt thalamus.    Patient Stated Goals wants to improve the balance    Currently in Pain? No/denies            Pt reporting that he has been feeling nauseous after doing exercises at home as he feels like he is over doing them. Discussed with pt to perform as prescribed and to spread them out throughout the day and to take rest breaks in between to see if it will help with feeling nauseous. Assessed BP today with pt's diastolic value staying too high at rest to safely participate in therapy. Pt reports he has taken his medication today - only  takes one pill in the morning. Pt reporting that he is asymptomatic throughout. Reviewed the signs/sx of a CVA with pt with pt able to verbalize understanding. Reviewed the BP limitations for therapy. Pt had been monitoring his BP at home (has been doing it 1x a day in the morning after taking his med) and reports it has not been this high. With pt present, PT called pt's PCP office, relayed information of what pt's BP is at this session and the past week. Was able to get pt scheduled earlier for an appt next Monday 06/08/20 (his next appt wasn't until April). Discussed with pt to go home and monitor BP and that if it stays elevated/pt notices any signs/sx of a CVA, then he needs to  go to the ER immediately. Discussed with pt to check BP before upcoming appts this week and if diastolic is above 71-24, will need to cancel appts. Pt in agreement.                             PT Short Term Goals - 05/19/20 0848      PT SHORT TERM GOAL #1   Title Pt will be independent with initial HEP in order to build upon functional gains made in therapy. ALL STGS DUE 06/03/20    Time 3    Period Weeks    Status New    Target Date 06/03/20      PT SHORT TERM GOAL #2   Title Pt will undergo further vestibular assessment with LTG to be written as appropriate.    Time 3    Period Weeks    Status New    Target Date 06/03/20      PT SHORT TERM GOAL #3   Title Pt will undergo DGI/FGA with LTG as appropriate to demo improved balance and decr fall risk.    Baseline LTG written (05/19/20)    Time 3    Period Weeks    Status Achieved      PT SHORT TERM GOAL #4   Title Pt will improve 5x sit <> stand time to at least 29 seconds or less with BUE support in order to demo improved functional BLE strength.    Baseline 34.75 seconds    Time 3    Period Weeks    Status New             PT Long Term Goals - 05/19/20 0847      PT LONG TERM GOAL #1   Title Pt will be independent with final HEP in order to build upon functional gains made in therapy.    Time 6    Period Weeks    Status New      PT LONG TERM GOAL #2   Title Vestibular goal to be written as appropriate based on vestibular assessment.    Time 6    Period Weeks    Status New      PT LONG TERM GOAL #3   Title Pt will score 30/30 on FGA to improve functional gait and balance.    Baseline 26/30 (05/19/20)    Time 6    Period Weeks    Status New      PT LONG TERM GOAL #4   Title Pt will ambulate at least 230' with supervision with scanning environment with no reports of feeling dizziness/off balance in order to demo improved functional mobility.    Time 6  Period Weeks    Status New      PT  LONG TERM GOAL #5   Title Pt will improve 5x sit <> stand time to at least 25 seconds or less with BUE support and RPE less than 4/10 in order to demo improved functional BLE strength.    Baseline 34.75 seconds    Time 6    Period Weeks    Status New                 Plan - 06/02/20 1635    Clinical Impression Statement See above.    Personal Factors and Comorbidities Comorbidity 3+;Profession;Past/Current Experience    Comorbidities PVD, obesity, HTN, DVT, DM    Examination-Activity Limitations Squat;Transfers;Stairs;Locomotion Level;Stand;Sit    Examination-Participation Restrictions Community Activity;Occupation;Cleaning    Stability/Clinical Decision Making Evolving/Moderate complexity    Rehab Potential Good    PT Frequency 2x / week    PT Duration 6 weeks    PT Treatment/Interventions ADLs/Self Care Home Management;Canalith Repostioning;Gait training;Stair training;Therapeutic activities;Functional mobility training;Therapeutic exercise;Balance training;Neuromuscular re-education;Patient/family education;Vasopneumatic Device;Vestibular    PT Next Visit Plan continue to monitor BP. perform a more thorough vestibular assessment- with goal/exercises added to HEP as appropriate. Work on turns, gait and balance with vertical head turns, stepping backwards, balance with eyes closed/on compliant surfaces.    PT Home Exercise Plan other access code: [facility]         Access Code: [facility]  URL: [email or web address]  Date: 05/19/2020  Prepared by: Markus Jarvis    Exercises  Sit to Stand with Arms Crossed - 1 x daily - 7 x weekly - 2 sets - 10 reps  Standing with half turns-Quick - 1 x daily - 7 x weekly - 1 sets - 3-5 reps    Consulted and Agree with Plan of Care Patient           Patient will benefit from skilled therapeutic intervention in order to improve the following deficits and impairments:  Abnormal gait,Decreased balance,Decreased activity tolerance,Decreased  endurance,Decreased strength,Difficulty walking,Dizziness,Obesity  Visit Diagnosis: Unsteadiness on feet  Muscle weakness (generalized)  Dizziness and giddiness     Problem List Patient Active Problem List   Diagnosis Date Noted  . Diplopia   . Ophthalmoplegia of right eye   . CVA (cerebral vascular accident) ([place]) 03/24/2020  . Acute CVA (cerebrovascular accident) ([place]) 03/24/2020  . Hyperlipidemia 12/31/2016  . PVD (peripheral vascular disease) ([place]) 01/29/2016  . Diverticulitis 01/28/2016  . Special screening for malignant neoplasms, colon   . Type 2 diabetes mellitus with diabetic dermatitis, without long-term current use of insulin ([place]) 05/03/2015  . History of deep vein thrombosis (DVT) of lower extremity 03/07/2013  . Ischemia of extremity 02/25/2013  . HTN (hypertension) 05/07/2011  . Erectile dysfunction 05/07/2011  . Obesity 05/07/2011    Arliss Journey, PT, DPT  06/02/2020, 4:36 PM  [facility] [address] [place] [place], [place], [address] Phone: [phone]   Fax:  [phone]  Name: ISSIAC JAMAR MRN: [id number] Date of Birth: Jun 03, 1965

## 2020-06-02 NOTE — Telephone Encounter (Signed)
Received call from [place] at [facility]. Chloe stated patient had a high bp reading of 137/95 last week and a high reading of 144/100 today. Patient told Chloe he has been taking his bp medication.   Chloe scheduled patient for an appointment with our provider next Monday, 06/08/20, to review medication.   If additional information is needed from [place], please advise at [phone]

## 2020-06-02 NOTE — Telephone Encounter (Signed)
Noted and filed

## 2020-06-03 ENCOUNTER — Ambulatory Visit: Payer: BC Managed Care – PPO | Admitting: Occupational Therapy

## 2020-06-04 ENCOUNTER — Ambulatory Visit: Payer: BC Managed Care – PPO | Admitting: Physical Therapy

## 2020-06-08 ENCOUNTER — Encounter: Payer: Self-pay | Admitting: Family Medicine

## 2020-06-08 ENCOUNTER — Other Ambulatory Visit: Payer: Self-pay

## 2020-06-08 ENCOUNTER — Ambulatory Visit: Payer: BC Managed Care – PPO | Admitting: Family Medicine

## 2020-06-08 VITALS — BP 126/82 | HR 108 | Temp 97.8°F | Resp 16 | Ht 72.0 in | Wt 376.4 lb

## 2020-06-08 DIAGNOSIS — I10 Essential (primary) hypertension: Secondary | ICD-10-CM

## 2020-06-08 DIAGNOSIS — R21 Rash and other nonspecific skin eruption: Secondary | ICD-10-CM

## 2020-06-08 DIAGNOSIS — E1159 Type 2 diabetes mellitus with other circulatory complications: Secondary | ICD-10-CM

## 2020-06-08 DIAGNOSIS — E781 Pure hyperglyceridemia: Secondary | ICD-10-CM

## 2020-06-08 DIAGNOSIS — R1084 Generalized abdominal pain: Secondary | ICD-10-CM | POA: Diagnosis not present

## 2020-06-08 DIAGNOSIS — I739 Peripheral vascular disease, unspecified: Secondary | ICD-10-CM

## 2020-06-08 DIAGNOSIS — Z8673 Personal history of transient ischemic attack (TIA), and cerebral infarction without residual deficits: Secondary | ICD-10-CM

## 2020-06-08 DIAGNOSIS — R112 Nausea with vomiting, unspecified: Secondary | ICD-10-CM

## 2020-06-08 MED ORDER — TRIAMCINOLONE ACETONIDE 0.1 % EX CREA: 1.0000 "application " | TOPICAL_CREAM | Freq: Two times a day (BID) | CUTANEOUS | 1 refills | Status: DC

## 2020-06-08 MED ORDER — ATORVASTATIN CALCIUM 40 MG PO TABS: 40.0000 mg | ORAL_TABLET | Freq: Every day | ORAL | 1 refills | Status: DC

## 2020-06-08 MED ORDER — FAMOTIDINE 20 MG PO TABS: 20.0000 mg | ORAL_TABLET | Freq: Two times a day (BID) | ORAL | 1 refills | Status: DC

## 2020-06-08 MED ORDER — HYDROCHLOROTHIAZIDE 25 MG PO TABS: 25.0000 mg | ORAL_TABLET | Freq: Every day | ORAL | 1 refills | Status: DC

## 2020-06-08 MED ORDER — SITAGLIPTIN PHOSPHATE 50 MG PO TABS: 50.0000 mg | ORAL_TABLET | Freq: Every day | ORAL | 1 refills | Status: DC

## 2020-06-08 MED ORDER — ONDANSETRON 4 MG PO TBDP: 4.0000 mg | ORAL_TABLET | Freq: Three times a day (TID) | ORAL | 4 refills | Status: DC | PRN

## 2020-06-08 MED ORDER — LISINOPRIL 5 MG PO TABS: 5.0000 mg | ORAL_TABLET | Freq: Every day | ORAL | 1 refills | Status: DC

## 2020-06-08 MED ORDER — CLOPIDOGREL BISULFATE 75 MG PO TABS
ORAL_TABLET | ORAL | 1 refills | Status: DC
Start: 2020-06-08 — End: 2021-03-23

## 2020-06-08 MED ORDER — AMLODIPINE BESYLATE 10 MG PO TABS: 10.0000 mg | ORAL_TABLET | Freq: Every day | ORAL | 1 refills | Status: DC

## 2020-06-08 NOTE — Progress Notes (Signed)
Subjective:  Patient ID: Carlos Phillips, male    DOB: [birth year]  Age: 55 y.o. MRN: [id number]  CC:  Chief Complaint  Patient presents with  . Diabetes    Pt has been unable to get reader has been unable to get this from the pharmacy. Pt has been working on his diet and exercise to try and help numbers, does need refills today no side effects noted   . Rash    Pt would like a refill kenalog as it has significantly reduced the inflammation and sxs have lessened   . Hyperlipidemia    Pt needs refill lipitor no side effects, needs refill as well as zofran due to morning nausea has about 3 episodes a week of morning nausea   . Hypertension    Pt BP high entering office today, within range on manual cuff. Pt reports no physical symptoms but does need refill of amlodipine and HCTZ    HPI Carlos Phillips presents for   Hypertension: With history of ischemic CVA.-Right eye ophthalmoplegia.  Has taken Zofran for nausea if turning head quickly in past now in am.  Nausea few mornings per week - vomiting this am.  BM QD.No fever. Stomach sore at times since stroke. Off and on. Nausea  Better with food. No dark stools, no BRBPR.   HTN treated with amlodipine 10 mg daily, HCTZ 25 mg daily - no missed doses.  Is on Plavix 75 mg daily with history of CVA. Message noted from March 8, blood pressure 137/95, 144/100 at outpatient neuro rehab. Sleep specialist eval February 8th.  Plan for sleep study.  Home readings:  140/92 on home machine  BP Readings from Last 3 Encounters:  06/08/20 126/82  06/02/20 (!) 142/106  05/26/20 (!) 137/95   Lab Results  Component Value Date   CREATININE 1.22 05/04/2020   Diabetes: With history of CVA, PVD. Metformin 500 mg daily last visit, diarrhea, fatigue with twice daily dosing.  Minimal change with daily dosing.  Changed Metformin to Januvia 50 mg daily  - feels well on this med.  No home readings.  Creatinine had increased slightly from 1.15-1.38,  then down to 1.22 at February 7 visit. Microalbumin: Normal ratio 05/04/2020 Optho, foot exam, pneumovax: Refer to ophthalmology last visit.  Lab Results  Component Value Date   HGBA1C 6.4 (H) 03/25/2020   HGBA1C 6.8 (H) 04/24/2019   HGBA1C 6.6 (H) 11/21/2018   Lab Results  Component Value Date   MICROALBUR 0.3 06/21/2015   LDLCALC 93 03/25/2020   CREATININE 1.22 05/04/2020   Rash Possible dry skin/atopic dermatitis.  Treated with triamcinolone, Eucerin or other hydrating cream also recommended, with plan for dermatology eval if not improving.  See photos last visit. Getting better with BID steroid cream. Less itching.  Not using any lotion.    History Patient Active Problem List   Diagnosis Date Noted  . Diplopia   . Ophthalmoplegia of right eye   . CVA (cerebral vascular accident) ([place]) 03/24/2020  . Acute CVA (cerebrovascular accident) ([place]) 03/24/2020  . Hyperlipidemia 12/31/2016  . PVD (peripheral vascular disease) ([place]) 01/29/2016  . Diverticulitis 01/28/2016  . Special screening for malignant neoplasms, colon   . Type 2 diabetes mellitus with diabetic dermatitis, without long-term current use of insulin ([place]) 05/03/2015  . History of deep vein thrombosis (DVT) of lower extremity 03/07/2013  . Ischemia of extremity 02/25/2013  . HTN (hypertension) 05/07/2011  . Erectile dysfunction 05/07/2011  . Obesity 05/07/2011  Past Medical History:  Diagnosis Date  . Diabetes mellitus without complication ([place])   . DVT (deep venous thrombosis) ([place]) 02/2013  . Erectile dysfunction   . Hyperlipidemia   . Hypertension   . Obesity    BMI >52  . PVD (peripheral vascular disease) ([place])    Past Surgical History:  Procedure Laterality Date  . ABDOMINAL AORTAGRAM N/A 02/28/2013   Procedure: ABDOMINAL Maxcine Ham;  Surgeon: Conrad Owings, MD;  Location: [facility] CATH LAB;  Service: Cardiovascular;  Laterality: N/A;  . COLONOSCOPY WITH PROPOFOL N/A 12/25/2015   Procedure: COLONOSCOPY WITH  PROPOFOL;  Surgeon: Manus Gunning, MD;  Location: [facility] ENDOSCOPY;  Service: Gastroenterology;  Laterality: N/A;  . EMBOLECTOMY Left 02/25/2013   Procedure: Left Popliteal EMBOLECTOMY Poss fasciotomy;  Surgeon: Elam Dutch, MD;  Location: [facility] OR;  Service: Vascular;  Laterality: Left;  Left poplital and Tibial embolectomy with four compartment Fasciotomy with vein patch angioplasty left popliteal artery.   Allergies  Allergen Reactions  . Morphine And Related Nausea And Vomiting   Prior to Admission medications   Medication Sig Start Date End Date Taking? Authorizing Provider  amLODipine (NORVASC) 10 MG tablet Take 1 tablet (10 mg total) by mouth daily. 04/02/20  Yes Wendie Agreste, MD  atorvastatin (LIPITOR) 40 MG tablet TAKE 1 TABLET BY MOUTH EVERY DAY 04/20/20  Yes Wendie Agreste, MD  clopidogrel (PLAVIX) 75 MG tablet TAKE 1 TABLET BY MOUTH EVERY DAY WITH BREAKFAST 04/10/20  Yes Wendie Agreste, MD  glucose blood test strip Use as instructed 05/04/20  Yes Wendie Agreste, MD  hydrochlorothiazide (HYDRODIURIL) 25 MG tablet Take 1 tablet (25 mg total) by mouth daily. 04/02/20  Yes Wendie Agreste, MD  Lancets [facility] ULTRASOFT) lancets Use as instructed 05/04/20  Yes Wendie Agreste, MD  Multiple Vitamins-Minerals (MULTIVITAMIN WITH MINERALS) tablet Take 1 tablet by mouth daily.   Yes [provider]  ondansetron (ZOFRAN ODT) 4 MG disintegrating tablet Take 1 tablet (4 mg total) by mouth every 8 (eight) hours as needed for nausea or vomiting. 04/29/20  Yes Frann Rider, NP  sildenafil (VIAGRA) 100 MG tablet Take 0.5-1 tablets (50-100 mg total) by mouth daily as needed for erectile dysfunction. 06/12/19 06/10/20 Yes Wendie Agreste, MD  sitaGLIPtin (JANUVIA) 50 MG tablet Take 1 tablet (50 mg total) by mouth daily. 05/04/20  Yes Wendie Agreste, MD  triamcinolone (KENALOG) 0.1 % Apply 1 application topically 2 (two) times daily. 05/04/20  Yes Wendie Agreste, MD   Social  History   Socioeconomic History  . Marital status: Married    Spouse name: Ivin Booty  . Number of children: 2  . Years of education: Not on file  . Highest education level: Not on file  Occupational History  . Occupation: Truck Geophysicist/field seismologist  Tobacco Use  . Smoking status: Former Smoker    Packs/day: 1.00    Years: 30.00    Pack years: 30.00    Types: Cigarettes    Quit date: 02/25/2013    Years since quitting: 7.2  . Smokeless tobacco: Never Used  Vaping Use  . Vaping Use: Never used  Substance and Sexual Activity  . Alcohol use: Yes    Alcohol/week: 4.0 standard drinks    Types: 4 Standard drinks or equivalent per week    Comment: social- rare  . Drug use: No  . Sexual activity: Yes    Partners: Female  Other Topics Concern  . Not on file  Social History  Narrative  . Not on file   Social Determinants of Health   Financial Resource Strain: Not on file  Food Insecurity: Not on file  Transportation Needs: Not on file  Physical Activity: Not on file  Stress: Not on file  Social Connections: Not on file  Intimate Partner Violence: Not on file    Review of Systems  Constitutional: Negative for fatigue and unexpected weight change.  Eyes: Negative for visual disturbance.  Respiratory: Negative for cough, chest tightness and shortness of breath.   Cardiovascular: Negative for chest pain, palpitations and leg swelling.  Gastrointestinal: Positive for abdominal pain, nausea and vomiting. Negative for blood in stool.  Neurological: Negative for dizziness, light-headedness and headaches.     Objective:   Vitals:   06/08/20 0928 06/08/20 0936  BP: (!) 150/90 126/82  Pulse: (!) 108   Resp: 16   Temp: 97.8 F (36.6 C)   TempSrc: Temporal   SpO2: 100%   Weight: (!) 376 lb 6.4 oz (170.7 kg)   Height: 6' (1.829 m)      Physical Exam Vitals reviewed.  Constitutional:      Appearance: He is well-developed. He is obese.  HENT:     Head: Normocephalic and atraumatic.   Eyes:     Pupils: Pupils are equal, round, and reactive to light.  Neck:     Vascular: No carotid bruit or JVD.  Cardiovascular:     Rate and Rhythm: Normal rate and regular rhythm.     Heart sounds: Normal heart sounds. No murmur heard.   Pulmonary:     Effort: Pulmonary effort is normal.     Breath sounds: Normal breath sounds. No rales.  Abdominal:     General: There is no distension.     Tenderness: There is abdominal tenderness (min RUQ, epigastric. ). There is no guarding. Negative signs include Murphy's sign and McBurney's sign.  Skin:    General: Skin is warm and dry.  Neurological:     Mental Status: He is alert and oriented to person, place, and time.     Assessment & Plan:  PRIYANSH PRY is a 55 y.o. male . Diffuse abdominal pain - Plan: Comprehensive metabolic panel, CBC, famotidine (PEPCID) 20 MG tablet Nausea and vomiting, intractability of vomiting not specified, unspecified vomiting type - Plan: ondansetron (ZOFRAN ODT) 4 MG disintegrating tablet, US Abdomen Complete  -Previously thought due to cough, symptoms, but now recurrent nausea, intermittent abdominal pain in the morning, improves during the day.  Differential includes gastritis/reflux, liver/gallbladder source but intermittent symptoms reassuring.  Check CBC, CMP, ultrasound.  Start Pepcid 20 mg twice daily, Zofran refilled for now with ER precautions  Essential hypertension - Plan: amLODipine (NORVASC) 10 MG tablet, hydrochlorothiazide (HYDRODIURIL) 25 MG tablet, lisinopril (ZESTRIL) 5 MG tablet  -Decreased control, especially with history of CVA we will strive for improved control.  Add lisinopril 5 mg daily, continue HCTZ, amlodipine same doses for now.  PVD (peripheral vascular disease) ([place]) - Plan: clopidogrel (PLAVIX) 75 MG tablet  -Continue same, no signs of new bleeding.  CBC as above  Controlled type 2 diabetes mellitus with other circulatory complication, without long-term current use of  insulin (H[facility]) - Plan: sitaGLIPtin (JANUVIA) 50 MG tablet, Hemoglobin A1c  -Tolerating Januvia, check A1c  Rash and nonspecific skin eruption - Plan: triamcinolone (KENALOG) 0.1 %, Ambulatory referral to Dermatology  -Still possible atopic dermatitis versus pityriasis rosea based on distribution on back, with secondary atopic dermatitis behind legs.  Continue triamcinolone  cream, add Eucerin, referred to dermatology  History of CVA (cerebrovascular accident) - Plan: atorvastatin (LIPITOR) 40 MG tablet Hypertriglyceridemia - Plan: atorvastatin (LIPITOR) 40 MG tablet, Lipid panel  -Tolerating statin, continue same.  Meds ordered this encounter  Medications  . amLODipine (NORVASC) 10 MG tablet    Sig: Take 1 tablet (10 mg total) by mouth daily.    Dispense:  90 tablet    Refill:  1  . atorvastatin (LIPITOR) 40 MG tablet    Sig: Take 1 tablet (40 mg total) by mouth daily.    Dispense:  90 tablet    Refill:  1  . clopidogrel (PLAVIX) 75 MG tablet    Sig: TAKE 1 TABLET BY MOUTH EVERY DAY WITH BREAKFAST    Dispense:  90 tablet    Refill:  1  . hydrochlorothiazide (HYDRODIURIL) 25 MG tablet    Sig: Take 1 tablet (25 mg total) by mouth daily.    Dispense:  90 tablet    Refill:  1  . sitaGLIPtin (JANUVIA) 50 MG tablet    Sig: Take 1 tablet (50 mg total) by mouth daily.    Dispense:  90 tablet    Refill:  1  . triamcinolone (KENALOG) 0.1 %    Sig: Apply 1 application topically 2 (two) times daily.    Dispense:  80 g    Refill:  1  . ondansetron (ZOFRAN ODT) 4 MG disintegrating tablet    Sig: Take 1 tablet (4 mg total) by mouth every 8 (eight) hours as needed for nausea or vomiting.    Dispense:  20 tablet    Refill:  4  . lisinopril (ZESTRIL) 5 MG tablet    Sig: Take 1 tablet (5 mg total) by mouth daily.    Dispense:  90 tablet    Refill:  1  . famotidine (PEPCID) 20 MG tablet    Sig: Take 1 tablet (20 mg total) by mouth 2 (two) times daily.    Dispense:  60 tablet    Refill:  1    Patient Instructions    Use eucerin lotion as well as steroid cream. I will refer you to dermatology.   Start pepcid acid blocker 2 times per day.  Ok to use zofran if needed. I will cehck labs and ultrasound. Be seen in the emergency room if worse symptoms. If nausea, or pain not improved in next 2 weeks - recheck with my colleague or other medical provider.   Add lisinopril for blood pressure. Keep a record of your blood pressures outside of the office and bring them to the next office visit in 2 months.   Return to the clinic or go to the nearest emergency room if any of your symptoms worsen or new symptoms occur.   Abdominal Pain, Adult Many things can cause belly (abdominal) pain. Most times, belly pain is not dangerous. Many cases of belly pain can be watched and treated at home. Sometimes, though, belly pain is serious. Your doctor will try to find the cause of your belly pain. Follow these instructions at home: Medicines  Take over-the-counter and prescription medicines only as told by your doctor.  Do not take medicines that help you poop (laxatives) unless told by your doctor. General instructions  Watch your belly pain for any changes.  Drink enough fluid to keep your pee (urine) pale yellow.  Keep all follow-up visits as told by your doctor. This is important.   Contact a doctor if:  Your belly  pain changes or gets worse.  You are not hungry, or you lose weight without trying.  You are having trouble pooping (constipated) or have watery poop (diarrhea) for more than 2-3 days.  You have pain when you pee or poop.  Your belly pain wakes you up at night.  Your pain gets worse with meals, after eating, or with certain foods.  You are vomiting and cannot keep anything down.  You have a fever.  You have blood in your pee. Get help right away if:  Your pain does not go away as soon as your doctor says it should.  You cannot stop vomiting.  Your pain is  only in areas of your belly, such as the right side or the left lower part of the belly.  You have bloody or black poop, or poop that looks like tar.  You have very bad pain, cramping, or bloating in your belly.  You have signs of not having enough fluid or water in your body (dehydration), such as: ? Dark pee, very little pee, or no pee. ? Cracked lips. ? Dry mouth. ? Sunken eyes. ? Sleepiness. ? Weakness.  You have trouble breathing or chest pain. Summary  Many cases of belly pain can be watched and treated at home.  Watch your belly pain for any changes.  Take over-the-counter and prescription medicines only as told by your doctor.  Contact a doctor if your belly pain changes or gets worse.  Get help right away if you have very bad pain, cramping, or bloating in your belly. This information is not intended to replace advice given to you by your health care provider. Make sure you discuss any questions you have with your health care provider. Document Revised: 07/23/2018 Document Reviewed: 07/23/2018 Elsevier Patient Education  2021 [employer].   If you have lab work done today you will be contacted with your lab results within the next 2 weeks.  If you have not heard from [place] then please contact us. The fastest way to get your results is to register for My Chart.   IF you received an x-ray today, you will receive an invoice from [facility]. Please contact [facility] at [phone] with questions or concerns regarding your invoice.   IF you received labwork today, you will receive an invoice from [place]. Please contact [facility] at [phone] with questions or concerns regarding your invoice.   Our billing staff will not be able to assist you with questions regarding bills from these companies.  You will be contacted with the lab results as soon as they are available. The fastest way to get your results is to activate your My Chart account.  Instructions are located on the last page of this paperwork. If you have not heard from [place] regarding the results in 2 weeks, please contact this office.          Signed, [name], MD [facility]

## 2020-06-08 NOTE — Patient Instructions (Addendum)
Use eucerin lotion as well as steroid cream. I will refer you to dermatology.   Start pepcid acid blocker 2 times per day.  Ok to use zofran if needed. I will cehck labs and ultrasound. Be seen in the emergency room if worse symptoms. If nausea, or pain not improved in next 2 weeks - recheck with my colleague or other medical provider.   Add lisinopril for blood pressure. Keep a record of your blood pressures outside of the office and bring them to the next office visit in 2 months.   Return to the clinic or go to the nearest emergency room if any of your symptoms worsen or new symptoms occur.   Abdominal Pain, Adult Many things can cause belly (abdominal) pain. Most times, belly pain is not dangerous. Many cases of belly pain can be watched and treated at home. Sometimes, though, belly pain is serious. Your doctor will try to find the cause of your belly pain. Follow these instructions at home: Medicines  Take over-the-counter and prescription medicines only as told by your doctor.  Do not take medicines that help you poop (laxatives) unless told by your doctor. General instructions  Watch your belly pain for any changes.  Drink enough fluid to keep your pee (urine) pale yellow.  Keep all follow-up visits as told by your doctor. This is important.   Contact a doctor if:  Your belly pain changes or gets worse.  You are not hungry, or you lose weight without trying.  You are having trouble pooping (constipated) or have watery poop (diarrhea) for more than 2-3 days.  You have pain when you pee or poop.  Your belly pain wakes you up at night.  Your pain gets worse with meals, after eating, or with certain foods.  You are vomiting and cannot keep anything down.  You have a fever.  You have blood in your pee. Get help right away if:  Your pain does not go away as soon as your doctor says it should.  You cannot stop vomiting.  Your pain is only in areas of your belly,  such as the right side or the left lower part of the belly.  You have bloody or black poop, or poop that looks like tar.  You have very bad pain, cramping, or bloating in your belly.  You have signs of not having enough fluid or water in your body (dehydration), such as: ? Dark pee, very little pee, or no pee. ? Cracked lips. ? Dry mouth. ? Sunken eyes. ? Sleepiness. ? Weakness.  You have trouble breathing or chest pain. Summary  Many cases of belly pain can be watched and treated at home.  Watch your belly pain for any changes.  Take over-the-counter and prescription medicines only as told by your doctor.  Contact a doctor if your belly pain changes or gets worse.  Get help right away if you have very bad pain, cramping, or bloating in your belly. This information is not intended to replace advice given to you by your health care provider. Make sure you discuss any questions you have with your health care provider. Document Revised: 07/23/2018 Document Reviewed: 07/23/2018 Elsevier Patient Education  2021 [employer].   If you have lab work done today you will be contacted with your lab results within the next 2 weeks.  If you have not heard from [place] then please contact us. The fastest way to get your results is to register for My Chart.  IF you received an x-ray today, you will receive an invoice from [facility]. Please contact [facility] at [phone] with questions or concerns regarding your invoice.   IF you received labwork today, you will receive an invoice from [place]. Please contact [facility] at [phone] with questions or concerns regarding your invoice.   Our billing staff will not be able to assist you with questions regarding bills from these companies.  You will be contacted with the lab results as soon as they are available. The fastest way to get your results is to activate your My Chart account. Instructions are located on the last  page of this paperwork. If you have not heard from [place] regarding the results in 2 weeks, please contact this office.

## 2020-06-09 ENCOUNTER — Encounter: Payer: Self-pay | Admitting: Physical Therapy

## 2020-06-09 ENCOUNTER — Ambulatory Visit: Payer: BC Managed Care – PPO | Admitting: Physical Therapy

## 2020-06-09 VITALS — BP 146/92

## 2020-06-09 DIAGNOSIS — R2681 Unsteadiness on feet: Secondary | ICD-10-CM | POA: Diagnosis not present

## 2020-06-09 DIAGNOSIS — M6281 Muscle weakness (generalized): Secondary | ICD-10-CM

## 2020-06-09 DIAGNOSIS — R278 Other lack of coordination: Secondary | ICD-10-CM | POA: Diagnosis not present

## 2020-06-09 DIAGNOSIS — R2689 Other abnormalities of gait and mobility: Secondary | ICD-10-CM | POA: Diagnosis not present

## 2020-06-09 DIAGNOSIS — R42 Dizziness and giddiness: Secondary | ICD-10-CM | POA: Diagnosis not present

## 2020-06-09 LAB — COMPREHENSIVE METABOLIC PANEL
ALT: 18 IU/L (ref 0–44)
AST: 15 IU/L (ref 0–40)
Albumin/Globulin Ratio: 1.5 (ref 1.2–2.2)
Albumin: 4.3 g/dL (ref 3.8–4.9)
Alkaline Phosphatase: 92 IU/L (ref 44–121)
BUN/Creatinine Ratio: 10 (ref 9–20)
BUN: 13 mg/dL (ref 6–24)
Bilirubin Total: 0.3 mg/dL (ref 0.0–1.2)
CO2: 22 mmol/L (ref 20–29)
Calcium: 9.8 mg/dL (ref 8.7–10.2)
Chloride: 102 mmol/L (ref 96–106)
Creatinine, Ser: 1.33 mg/dL — ABNORMAL HIGH (ref 0.76–1.27)
Globulin, Total: 2.8 g/dL (ref 1.5–4.5)
Glucose: 160 mg/dL — ABNORMAL HIGH (ref 65–99)
Potassium: 4.1 mmol/L (ref 3.5–5.2)
Sodium: 142 mmol/L (ref 134–144)
Total Protein: 7.1 g/dL (ref 6.0–8.5)
eGFR: 63 mL/min/{1.73_m2} (ref >59)

## 2020-06-09 LAB — LIPID PANEL
Chol/HDL Ratio: 4.4 ratio (ref 0.0–5.0)
Cholesterol, Total: 235 mg/dL — ABNORMAL HIGH (ref 100–199)
HDL: 54 mg/dL (ref >39)
LDL Chol Calc (NIH): 149 mg/dL — ABNORMAL HIGH (ref 0–99)
Triglycerides: 179 mg/dL — ABNORMAL HIGH (ref 0–149)
VLDL Cholesterol Cal: 32 mg/dL (ref 5–40)

## 2020-06-09 LAB — CBC
Hematocrit: 49.1 % (ref 37.5–51.0)
Hemoglobin: 16.2 g/dL (ref 13.0–17.7)
MCH: 28.4 pg (ref 26.6–33.0)
MCHC: 33 g/dL (ref 31.5–35.7)
MCV: 86 fL (ref 79–97)
Platelets: 282 10*3/uL (ref 150–450)
RBC: 5.71 x10E6/uL (ref 4.14–5.80)
RDW: 13.9 % (ref 11.6–15.4)
WBC: 9.9 10*3/uL (ref 3.4–10.8)

## 2020-06-09 LAB — HEMOGLOBIN A1C
Est. average glucose Bld gHb Est-mCnc: 151 mg/dL
Hgb A1c MFr Bld: 6.9 % — ABNORMAL HIGH (ref 4.8–5.6)

## 2020-06-09 NOTE — Patient Instructions (Signed)
WALKING  Walking is a great form of exercise to increase your strength, endurance and overall fitness.  A walking program can help you start slowly and gradually build endurance as you go.  Everyone's ability is different, so each person's starting point will be different.  You do not have to follow them exactly.  The are just samples. You should simply find out what's right for you and stick to that program.   In the beginning, you'll start off walking 2-3 times a day for short distances.  As you get stronger, you'll be walking further at just 1-2 times per day.  A. You Can Walk For A Certain Length Of Time Each Day    Walk 2-3 minutes 2-3 times per day.  Increase 1 minute every 2-3 days. Gradually building up the time that you walk.      Example:   Week 1 2-3 minutes 3 times per day   Week 2 5-7 minutes 2-3 times per day   Week 3  10-12 minutes 1-2 times per day  *making sure that you are monitoring your blood pressure.

## 2020-06-09 NOTE — Therapy (Signed)
[facility] [address] [place], [place], [address] Phone: [phone]   Fax:  [phone]  Physical Therapy Treatment  Patient Details  Name: Carlos Phillips MRN: [id number] Date of Birth: 03-21-1966 Referring Provider (PT): Frann Rider (being followed by, referred by hospitalist)   Encounter Date: 06/09/2020   PT End of Session - 06/09/20 1114    Visit Number 4    Number of Visits 13    Authorization Type BCBS - VL: 30 PT/OT/ST combined - each visit of each type counts    Authorization - Visit Number 3    Authorization - Number of Visits 30    PT Start Time [phone]    PT Stop Time 1011    PT Time Calculation (min) 46 min    Equipment Utilized During Treatment Gait belt    Activity Tolerance Patient tolerated treatment well   limited by high BP   Behavior During Therapy [facility] for tasks assessed/performed           Past Medical History:  Diagnosis Date  . Diabetes mellitus without complication ([place])   . DVT (deep venous thrombosis) ([place]) 02/2013  . Erectile dysfunction   . Hyperlipidemia   . Hypertension   . Obesity    BMI >52  . PVD (peripheral vascular disease) ([place])     Past Surgical History:  Procedure Laterality Date  . ABDOMINAL AORTAGRAM N/A 02/28/2013   Procedure: ABDOMINAL Maxcine Ham;  Surgeon: Conrad Hall Summit, MD;  Location: [facility] CATH LAB;  Service: Cardiovascular;  Laterality: N/A;  . COLONOSCOPY WITH PROPOFOL N/A 12/25/2015   Procedure: COLONOSCOPY WITH PROPOFOL;  Surgeon: Manus Gunning, MD;  Location: [facility] ENDOSCOPY;  Service: Gastroenterology;  Laterality: N/A;  . EMBOLECTOMY Left 02/25/2013   Procedure: Left Popliteal EMBOLECTOMY Poss fasciotomy;  Surgeon: Elam Dutch, MD;  Location: [facility] OR;  Service: Vascular;  Laterality: Left;  Left poplital and Tibial embolectomy with four compartment Fasciotomy with vein patch angioplasty left popliteal artery.    Vitals:   06/09/20 0931 06/09/20  0959  BP: 140/90 (!) 146/92     Subjective Assessment - 06/09/20 0927    Subjective Saw his PCP yesterday. lisinopril for blood pressure was added. Will also be getting an ultrasound of his abdomen due to nausea/pain. Feels like balance is getting better.    Pertinent History PVD, obesity, HTN, DVT, DM    Limitations Walking    How long can you stand comfortably? 10-15 minutes after standing up in the kitchen    Diagnostic tests MRI of brain showed small acute infarct in the midbrain in the expected region of the Rt oculomotor nucleus as well as a small remote lacunar infarct in the Rt thalamus.    Patient Stated Goals wants to improve the balance    Currently in Pain? No/denies                             [facility] Adult PT Treatment/Exercise - 06/09/20 0001      Transfers   Transfers Stand to Sit;Sit to Stand    Sit to Stand 5: Supervision;Without upper extremity assist    Five time sit to stand comments  13.88 seconds with wider BOS, no dizziness afterwards    Stand to Sit 5: Supervision;Without upper extremity assist    Comments reviewed sit <> stand as exercise apart of pt's HEP.      Ambulation/Gait   Ambulation/Gait Yes  Ambulation/Gait Assistance 5: Supervision    Ambulation/Gait Assistance Details gait with scanning environment (pt able to maintain walking speed with head motions), quick 180 degree turns to both R/L - pt with no dizzines when performing.    Ambulation Distance (Feet) 250 Feet    Assistive device None    Gait Pattern Step-through pattern    Ambulation Surface Level;Indoor    Gait Comments discussed beginning a gentle walking program at home - walking 2-3 times a day for 2-3 minutes at a time - walking laps around house or smaller distances outside and gradually building up time walking. and to monitor BP. see pt instructions for more info               Balance Exercises - 06/09/20 0955      Balance Exercises: Standing   Standing  Eyes Closed Wide (BOA);Foam/compliant surface;Narrow base of support (BOS);Limitations    Standing Eyes Closed Limitations beginning with wide BOS and progressing to more narrow, on blue air ex 3 x 30 seconds, with feet more narrow (but still some distance between them) head turns x10 reps, head nods x10 reps (incr postural sway with nods)    SLS with Vectors Foam/compliant surface;Intermittent upper extremity assist;Limitations    SLS with Vectors Limitations standing on blue air ex: alternating toe taps to cones x8 reps B    Stepping Strategy Posterior;Foam/compliant surface;10 reps;Limitations    Stepping Strategy Limitations on blue air ex, no UE support             PT Education - 06/09/20 1113    Education Details beginning with a gentle walking program for activity tolerance/endurance (see pt instructions)    Person(s) Educated Patient    Methods Explanation;Handout    Comprehension Verbalized understanding            PT Short Term Goals - 06/09/20 0938      PT SHORT TERM GOAL #1   Title Pt will be independent with initial HEP in order to build upon functional gains made in therapy. ALL STGS DUE 06/03/20    Baseline pt reports performing HEP consistently at home.    Time 3    Period Weeks    Status Achieved    Target Date 06/03/20      PT SHORT TERM GOAL #2   Title Pt will undergo further vestibular assessment with LTG to be written as appropriate.    Time 3    Period Weeks    Status New    Target Date 06/03/20      PT SHORT TERM GOAL #3   Title Pt will undergo DGI/FGA with LTG as appropriate to demo improved balance and decr fall risk.    Baseline LTG written (05/19/20)    Time 3    Period Weeks    Status Achieved      PT SHORT TERM GOAL #4   Title Pt will improve 5x sit <> stand time to at least 29 seconds or less with BUE support in order to demo improved functional BLE strength.    Baseline 34.75 seconds, 13.88 seconds with no UE support on 06/09/20    Time 3     Period Weeks    Status Achieved             PT Long Term Goals - 05/19/20 0847      PT LONG TERM GOAL #1   Title Pt will be independent with final HEP in order to build upon functional  gains made in therapy.    Time 6    Period Weeks    Status New      PT LONG TERM GOAL #2   Title Vestibular goal to be written as appropriate based on vestibular assessment.    Time 6    Period Weeks    Status New      PT LONG TERM GOAL #3   Title Pt will score 30/30 on FGA to improve functional gait and balance.    Baseline 26/30 (05/19/20)    Time 6    Period Weeks    Status New      PT LONG TERM GOAL #4   Title Pt will ambulate at least 230' with supervision with scanning environment with no reports of feeling dizziness/off balance in order to demo improved functional mobility.    Time 6    Period Weeks    Status New      PT LONG TERM GOAL #5   Title Pt will improve 5x sit <> stand time to at least 25 seconds or less with BUE support and RPE less than 4/10 in order to demo improved functional BLE strength.    Baseline 34.75 seconds    Time 6    Period Weeks    Status New                 Plan - 06/09/20 1116    Clinical Impression Statement Pt's BP WFL for therapy today. Pt met STG for HEP and 5x sit <> stand. Performed in 13.88 seconds with no UE support from standard mat table. Performed gait training with head motions and quick turns - pt with no reports of dizziness today and able to maintain gait speed while performing. Continued balance on compliant surfaces with vision removed, pt tolerated session well. Needed intermittent seated rest breaks throughout. Will continue to progress towards LTGs.    Personal Factors and Comorbidities Comorbidity 3+;Profession;Past/Current Experience    Comorbidities PVD, obesity, HTN, DVT, DM    Examination-Activity Limitations Squat;Transfers;Stairs;Locomotion Level;Stand;Sit    Examination-Participation Restrictions Community  Activity;Occupation;Cleaning    Stability/Clinical Decision Making Evolving/Moderate complexity    Rehab Potential Good    PT Frequency 2x / week    PT Duration 6 weeks    PT Treatment/Interventions ADLs/Self Care Home Management;Canalith Repostioning;Gait training;Stair training;Therapeutic activities;Functional mobility training;Therapeutic exercise;Balance training;Neuromuscular re-education;Patient/family education;Vasopneumatic Device;Vestibular    PT Next Visit Plan continue to monitor BP (better with manual cuff). vestibular assessment if appropriate (pt reporting no dizziness on 06/09/20 during session), Work on turns, balance with vision removed and on compliant surfaces, endurance/activity tolerance.    PT Home Exercise Plan other access code: [email or web address]         Access Code: [facility]  URL: [email or web address]  Date: 05/19/2020  Prepared by: Markus Jarvis    Exercises  Sit to Stand with Arms Crossed - 1 x daily - 7 x weekly - 2 sets - 10 reps  Standing with half turns-Quick - 1 x daily - 7 x weekly - 1 sets - 3-5 reps    Consulted and Agree with Plan of Care Patient           Patient will benefit from skilled therapeutic intervention in order to improve the following deficits and impairments:  Abnormal gait,Decreased balance,Decreased activity tolerance,Decreased endurance,Decreased strength,Difficulty walking,Dizziness,Obesity  Visit Diagnosis: Unsteadiness on feet  Muscle weakness (generalized)  Other abnormalities of gait and mobility     Problem List Patient Active Problem List  Diagnosis Date Noted  . Diplopia   . Ophthalmoplegia of right eye   . CVA (cerebral vascular accident) ([place]) 03/24/2020  . Acute CVA (cerebrovascular accident) ([place]) 03/24/2020  . Hyperlipidemia 12/31/2016  . PVD (peripheral vascular disease) ([place]) 01/29/2016  . Diverticulitis 01/28/2016  . Special screening for malignant neoplasms, colon   . Type 2 diabetes mellitus with  diabetic dermatitis, without long-term current use of insulin ([place]) 05/03/2015  . History of deep vein thrombosis (DVT) of lower extremity 03/07/2013  . Ischemia of extremity 02/25/2013  . HTN (hypertension) 05/07/2011  . Erectile dysfunction 05/07/2011  . Obesity 05/07/2011    Arliss Journey, PT, DPT  06/09/2020, 11:22 AM  [facility] [address] [place], [place], [address] Phone: [phone]   Fax:  [phone]  Name: Carlos Phillips MRN: [id number] Date of Birth: Oct 16, 1965

## 2020-06-10 ENCOUNTER — Ambulatory Visit: Payer: BC Managed Care – PPO | Admitting: Occupational Therapy

## 2020-06-11 ENCOUNTER — Ambulatory Visit: Payer: BC Managed Care – PPO | Admitting: Occupational Therapy

## 2020-06-11 ENCOUNTER — Encounter: Payer: Self-pay | Admitting: Physical Therapy

## 2020-06-11 ENCOUNTER — Ambulatory Visit (INDEPENDENT_AMBULATORY_CARE_PROVIDER_SITE_OTHER): Payer: BC Managed Care – PPO | Admitting: Neurology

## 2020-06-11 ENCOUNTER — Ambulatory Visit: Payer: BC Managed Care – PPO | Admitting: Physical Therapy

## 2020-06-11 ENCOUNTER — Other Ambulatory Visit: Payer: Self-pay

## 2020-06-11 VITALS — BP 127/93 | HR 77

## 2020-06-11 DIAGNOSIS — R278 Other lack of coordination: Secondary | ICD-10-CM | POA: Diagnosis not present

## 2020-06-11 DIAGNOSIS — G4733 Obstructive sleep apnea (adult) (pediatric): Secondary | ICD-10-CM | POA: Diagnosis not present

## 2020-06-11 DIAGNOSIS — R0683 Snoring: Secondary | ICD-10-CM

## 2020-06-11 DIAGNOSIS — Z8673 Personal history of transient ischemic attack (TIA), and cerebral infarction without residual deficits: Secondary | ICD-10-CM

## 2020-06-11 DIAGNOSIS — R2681 Unsteadiness on feet: Secondary | ICD-10-CM

## 2020-06-11 DIAGNOSIS — M6281 Muscle weakness (generalized): Secondary | ICD-10-CM | POA: Diagnosis not present

## 2020-06-11 DIAGNOSIS — I639 Cerebral infarction, unspecified: Secondary | ICD-10-CM

## 2020-06-11 DIAGNOSIS — Z6841 Body Mass Index (BMI) 40.0 and over, adult: Secondary | ICD-10-CM

## 2020-06-11 DIAGNOSIS — R351 Nocturia: Secondary | ICD-10-CM

## 2020-06-11 DIAGNOSIS — R2689 Other abnormalities of gait and mobility: Secondary | ICD-10-CM

## 2020-06-11 DIAGNOSIS — R42 Dizziness and giddiness: Secondary | ICD-10-CM | POA: Diagnosis not present

## 2020-06-11 DIAGNOSIS — G4720 Circadian rhythm sleep disorder, unspecified type: Secondary | ICD-10-CM

## 2020-06-11 NOTE — Patient Instructions (Signed)
  Coordination Activities  Perform the following activities for 10 minutes 1 times per day with right hand(s).   Rotate ball in fingertips (clockwise and counter-clockwise).  Deal cards with your thumb (Hold deck in hand and push card off top with thumb).  Rotate 1 card in hand (clockwise and counter-clockwise).  Pick up coins one at a time until you get 5 in your hand, then move coins from palm to fingertips to stack one at a time.

## 2020-06-11 NOTE — Therapy (Signed)
[facility] [address] [place], [place], [address] Phone: [phone]   Fax:  [phone]  Occupational Therapy Treatment  Patient Details  Name: Carlos Phillips MRN: [id number] Date of Birth: [birth year] Referring Provider (OT): Dr. [name] - PCP   Encounter Date: 06/11/2020   OT End of Session - 06/11/20 0808    Visit Number 2    Number of Visits 6    Date for OT Re-Evaluation 06/24/20    Authorization Type BC/BS - [employer], VL: 30 combined    OT Start Time 0800    OT Stop Time 0845    OT Time Calculation (min) 45 min    Activity Tolerance Patient tolerated treatment well    Behavior During Therapy [facility] for tasks assessed/performed           Past Medical History:  Diagnosis Date  . Diabetes mellitus without complication ([place])   . DVT (deep venous thrombosis) ([place]) 02/2013  . Erectile dysfunction   . Hyperlipidemia   . Hypertension   . Obesity    BMI >52  . PVD (peripheral vascular disease) ([place])     Past Surgical History:  Procedure Laterality Date  . ABDOMINAL AORTAGRAM N/A 02/28/2013   Procedure: ABDOMINAL Maxcine Ham;  Surgeon: Conrad Norman, MD;  Location: [facility] CATH LAB;  Service: Cardiovascular;  Laterality: N/A;  . COLONOSCOPY WITH PROPOFOL N/A 12/25/2015   Procedure: COLONOSCOPY WITH PROPOFOL;  Surgeon: Manus Gunning, MD;  Location: [facility] ENDOSCOPY;  Service: Gastroenterology;  Laterality: N/A;  . EMBOLECTOMY Left 02/25/2013   Procedure: Left Popliteal EMBOLECTOMY Poss fasciotomy;  Surgeon: Elam Dutch, MD;  Location: [facility] OR;  Service: Vascular;  Laterality: Left;  Left poplital and Tibial embolectomy with four compartment Fasciotomy with vein patch angioplasty left popliteal artery.    There were no vitals filed for this visit.   Subjective Assessment - 06/11/20 0803    Subjective  The light headedness is getting better    Pertinent History MRI of brain showed small acute infarct in  the midbrain in the expected region of the Rt oculomotor nucleus as well as a small remote lacunar infarct in the Rt thalamus 03/24/20.  PMH includes:  PVD, obesity, HTN, DVT, DM    Limitations no driving    Currently in Pain? No/denies           Pt issued high level coordination HEP - See pt instructions for details.  Pt performing high level word search w/o difficulty in reasonable amt of time. Environmental scanning with physical task finding 9/12 items on first pass. Pt missed 2 upper Rt, and 1 on Lt w/ busy background. Pt found 2/3 remaining items on 2nd pass.  Pt stood and completed 15 minutes of functional tasks/IADLS including simulating making bed, sweeping, and carrying laundry basket w/o rest. Pt instructed to gradually improve endurance with tasks at home                     OT Education - 06/11/20 0816    Education Details coordination HEP    Person(s) Educated Patient    Methods Explanation;Demonstration;Handout    Comprehension Verbalized understanding;Returned demonstration               OT Long Term Goals - 06/11/20 1023      OT LONG TERM GOAL #1   Title Independent with high level coordination HEP    Time 6    Period Weeks    Status  Achieved      OT LONG TERM GOAL #2   Title Pt to perform environmental scanning with physical tasks at 88% accuracy or greater in prep for return to driving/work tasks    Time 6    Period Weeks    Status On-going   06/11/20: 75%     OT LONG TERM GOAL #3   Title Pt to stand for 15 min or > w/o rest to complete IADL tasks safely    Time 6    Period Weeks    Status Achieved      OT LONG TERM GOAL #4   Title Pt to verbalize understanding with possible visual adaptive devices and modifications to help reduce glare    Time 6    Period Weeks    Status New                 Plan - 06/11/20 1024    Clinical Impression Statement Pt has met 2/4 LTG's at this time. Pt also reports glare hasn't been  bothering him as much so LTG #4 may be deferred if consistently better.    OT Occupational Profile and History Problem Focused Assessment - Including review of records relating to presenting problem    Occupational performance deficits (Please refer to evaluation for details): IADL's;Work;Leisure    Body Structure / Function / Physical Skills Endurance;IADL;Vision;Coordination;FMC;Decreased knowledge of use of DME;Body mechanics    Rehab Potential Good    Clinical Decision Making Limited treatment options, no task modification necessary    Comorbidities Affecting Occupational Performance: May have comorbidities impacting occupational performance    Modification or Assistance to Complete Evaluation  No modification of tasks or assist necessary to complete eval    OT Frequency 1x / week    OT Duration 6 weeks    OT Treatment/Interventions Self-care/ADL training;Therapeutic activities;Therapeutic exercise;Neuromuscular education;Functional Mobility Training;Visual/perceptual remediation/compensation;Patient/family education    Plan continue environmental scanning, standing endurance, strengthening    Consulted and Agree with Plan of Care Patient           Patient will benefit from skilled therapeutic intervention in order to improve the following deficits and impairments:   Body Structure / Function / Physical Skills: Endurance,IADL,Vision,Coordination,FMC,Decreased knowledge of use of DME,Body mechanics       Visit Diagnosis: Other lack of coordination  Unsteadiness on feet    Problem List Patient Active Problem List   Diagnosis Date Noted  . Diplopia   . Ophthalmoplegia of right eye   . CVA (cerebral vascular accident) ([place]) 03/24/2020  . Acute CVA (cerebrovascular accident) ([place]) 03/24/2020  . Hyperlipidemia 12/31/2016  . PVD (peripheral vascular disease) ([place]) 01/29/2016  . Diverticulitis 01/28/2016  . Special screening for malignant neoplasms, colon   . Type 2 diabetes  mellitus with diabetic dermatitis, without long-term current use of insulin ([place]) 05/03/2015  . History of deep vein thrombosis (DVT) of lower extremity 03/07/2013  . Ischemia of extremity 02/25/2013  . HTN (hypertension) 05/07/2011  . Erectile dysfunction 05/07/2011  . Obesity 05/07/2011    Carey Bullocks, OTR/L 06/11/2020, 10:26 AM  [facility] [address] [place], [place], [address] Phone: [phone]   Fax:  [phone]  Name: CAMARON CAMMACK MRN: [id number] Date of Birth: [birth year]

## 2020-06-12 NOTE — Therapy (Signed)
[facility] [address] [place], [place], [address] Phone: [phone]   Fax:  [phone]  Physical Therapy Treatment  Patient Details  Name: Carlos Phillips MRN: [id number] Date of Birth: [birth year] Referring Provider (PT): Frann Rider (being followed by, referred by hospitalist)   Encounter Date: 06/11/2020   PT End of Session - 06/12/20 1059    Visit Number 5    Number of Visits 13    Authorization Type BCBS - VL: 30 PT/OT/ST combined - each visit of each type counts    Authorization - Visit Number 5    Authorization - Number of Visits 30    PT Start Time 8676    PT Stop Time 0932    PT Time Calculation (min) 45 min    Activity Tolerance Patient tolerated treatment well   limited by high BP   Behavior During Therapy [facility] for tasks assessed/performed           Past Medical History:  Diagnosis Date  . Diabetes mellitus without complication ([place])   . DVT (deep venous thrombosis) ([place]) 02/2013  . Erectile dysfunction   . Hyperlipidemia   . Hypertension   . Obesity    BMI >52  . PVD (peripheral vascular disease) ([place])     Past Surgical History:  Procedure Laterality Date  . ABDOMINAL AORTAGRAM N/A 02/28/2013   Procedure: ABDOMINAL Maxcine Ham;  Surgeon: Conrad Roper, MD;  Location: [facility] CATH LAB;  Service: Cardiovascular;  Laterality: N/A;  . COLONOSCOPY WITH PROPOFOL N/A 12/25/2015   Procedure: COLONOSCOPY WITH PROPOFOL;  Surgeon: Manus Gunning, MD;  Location: [facility] ENDOSCOPY;  Service: Gastroenterology;  Laterality: N/A;  . EMBOLECTOMY Left 02/25/2013   Procedure: Left Popliteal EMBOLECTOMY Poss fasciotomy;  Surgeon: Elam Dutch, MD;  Location: [facility] OR;  Service: Vascular;  Laterality: Left;  Left poplital and Tibial embolectomy with four compartment Fasciotomy with vein patch angioplasty left popliteal artery.    Vitals:   06/11/20 0855  BP: (!) 127/93  Pulse: 77     Subjective Assessment -  06/11/20 0850    Subjective Pt reports he is not having any dizziness today; states things have been getting much better the past 4 days.  Pt states he used to get dizzy if he stood up too fast, did a quick turn, or did an activity for an extended period of time but today he did 15" walking in OT prior to PT and it did not bother him    Pertinent History PVD, obesity, HTN, DVT, DM    Limitations Walking    How long can you stand comfortably? 10-15 minutes after standing up in the kitchen    Diagnostic tests MRI of brain showed small acute infarct in the midbrain in the expected region of the Rt oculomotor nucleus as well as a small remote lacunar infarct in the Rt thalamus.    Patient Stated Goals wants to improve the balance    Currently in Pain? No/denies                             [facility] Adult PT Treatment/Exercise - 06/12/20 0001      Ambulation/Gait   Ambulation/Gait Yes    Ambulation/Gait Assistance 5: Supervision    Ambulation Distance (Feet) 230 Feet   115' with intermittent head turns horizontally   Assistive device None    Gait Pattern Step-through pattern    Ambulation Surface Level;Indoor  instructed to amb. at increased speed     High Level Balance   High Level Balance Activities Sudden stops;Head turns;Turns;Backward walking   20' backward x 2 reps              Balance Exercises - 06/12/20 0001      Balance Exercises: Standing   Standing Eyes Opened Narrow base of support (BOS);Wide (BOA);Head turns;Foam/compliant surface;5 reps   horizontal and vertical head turns EO and EC   Standing Eyes Closed Narrow base of support (BOS);Wide (BOA);Head turns;Foam/compliant surface;5 reps   horizontal & vertical head turns   Tandem Stance Eyes open;2 reps;10 secs   solid surface   Marching Foam/compliant surface;Head turns;Static;10 reps   EO and EC   Other Standing Exercises standing on 2 pillows in corner - pt performed trunk rotations - side to side and  diagonals 5 reps each direction with EO and then with EC with CGA for safety    Other Standing Exercises Comments Marching on incline - solid surface with EO and with EC; EO with horizontal and vertical head turns 5 reps each direction               PT Short Term Goals - 06/12/20 1104      PT SHORT TERM GOAL #1   Title Pt will be independent with initial HEP in order to build upon functional gains made in therapy. ALL STGS DUE 06/03/20    Baseline pt reports performing HEP consistently at home.    Time 3    Period Weeks    Status Achieved    Target Date 06/03/20      PT SHORT TERM GOAL #2   Title Pt will undergo further vestibular assessment with LTG to be written as appropriate.    Time 3    Period Weeks    Status New    Target Date 06/03/20      PT SHORT TERM GOAL #3   Title Pt will undergo DGI/FGA with LTG as appropriate to demo improved balance and decr fall risk.    Baseline LTG written (05/19/20)    Time 3    Period Weeks    Status Achieved      PT SHORT TERM GOAL #4   Title Pt will improve 5x sit <> stand time to at least 29 seconds or less with BUE support in order to demo improved functional BLE strength.    Baseline 34.75 seconds, 13.88 seconds with no UE support on 06/09/20    Time 3    Period Weeks    Status Achieved             PT Long Term Goals - 06/12/20 1104      PT LONG TERM GOAL #1   Title Pt will be independent with final HEP in order to build upon functional gains made in therapy.    Time 6    Period Weeks    Status New      PT LONG TERM GOAL #2   Title Vestibular goal to be written as appropriate based on vestibular assessment.    Time 6    Period Weeks    Status New      PT LONG TERM GOAL #3   Title Pt will score 30/30 on FGA to improve functional gait and balance.    Baseline 26/30 (05/19/20)    Time 6    Period Weeks    Status New      PT LONG  TERM GOAL #4   Title Pt will ambulate at least 230' with supervision with scanning  environment with no reports of feeling dizziness/off balance in order to demo improved functional mobility.    Time 6    Period Weeks    Status New      PT LONG TERM GOAL #5   Title Pt will improve 5x sit <> stand time to at least 25 seconds or less with BUE support and RPE less than 4/10 in order to demo improved functional BLE strength.    Baseline 34.75 seconds    Time 6    Period Weeks    Status New                 Plan - 06/12/20 1100    Clinical Impression Statement Pt tolerated dynamic balance exercises well (increased vestibular input needed to maintain balance as compliant surface training and head turns incorporated with activities) - pt reported no dizziness and pushed himself to continue with activity; minimal rest breaks needed.  Pt is progressing well towards goals.    Personal Factors and Comorbidities Comorbidity 3+;Profession;Past/Current Experience    Comorbidities PVD, obesity, HTN, DVT, DM    Examination-Activity Limitations Squat;Transfers;Stairs;Locomotion Level;Stand;Sit    Examination-Participation Restrictions Community Activity;Occupation;Cleaning    Stability/Clinical Decision Making Evolving/Moderate complexity    Rehab Potential Good    PT Frequency 2x / week    PT Duration 6 weeks    PT Treatment/Interventions ADLs/Self Care Home Management;Canalith Repostioning;Gait training;Stair training;Therapeutic activities;Functional mobility training;Therapeutic exercise;Balance training;Neuromuscular re-education;Patient/family education;Vasopneumatic Device;Vestibular    PT Next Visit Plan continue to monitor BP (better with manual cuff). vestibular assessment if appropriate (pt reporting no dizziness on 06/09/20 during session), Work on turns, balance with vision removed and on compliant surfaces, endurance/activity tolerance.    PT Home Exercise Plan other access code: [facility]         Access Code: [facility]  URL: [email or web address]  Date:  05/19/2020  Prepared by: Markus Jarvis    Exercises  Sit to Stand with Arms Crossed - 1 x daily - 7 x weekly - 2 sets - 10 reps  Standing with half turns-Quick - 1 x daily - 7 x weekly - 1 sets - 3-5 reps    Consulted and Agree with Plan of Care Patient           Patient will benefit from skilled therapeutic intervention in order to improve the following deficits and impairments:  Abnormal gait,Decreased balance,Decreased activity tolerance,Decreased endurance,Decreased strength,Difficulty walking,Dizziness,Obesity  Visit Diagnosis: Unsteadiness on feet  Other abnormalities of gait and mobility     Problem List Patient Active Problem List   Diagnosis Date Noted  . Diplopia   . Ophthalmoplegia of right eye   . CVA (cerebral vascular accident) ([place]) 03/24/2020  . Acute CVA (cerebrovascular accident) ([place]) 03/24/2020  . Hyperlipidemia 12/31/2016  . PVD (peripheral vascular disease) ([place]) 01/29/2016  . Diverticulitis 01/28/2016  . Special screening for malignant neoplasms, colon   . Type 2 diabetes mellitus with diabetic dermatitis, without long-term current use of insulin ([place]) 05/03/2015  . History of deep vein thrombosis (DVT) of lower extremity 03/07/2013  . Ischemia of extremity 02/25/2013  . HTN (hypertension) 05/07/2011  . Erectile dysfunction 05/07/2011  . Obesity 05/07/2011    Alda Lea, PT 06/12/2020, 11:06 AM  [facility] [address] [place], [place], [address] Phone: [phone]   Fax:  [phone]  Name: VIMAL DEREGO MRN: [id number] Date of  Birth: 1965-08-01

## 2020-06-16 ENCOUNTER — Other Ambulatory Visit: Payer: Self-pay

## 2020-06-16 ENCOUNTER — Ambulatory Visit: Payer: BC Managed Care – PPO | Admitting: Physical Therapy

## 2020-06-16 ENCOUNTER — Ambulatory Visit: Payer: BC Managed Care – PPO | Admitting: Occupational Therapy

## 2020-06-16 VITALS — BP 138/90 | HR 74

## 2020-06-16 DIAGNOSIS — R2681 Unsteadiness on feet: Secondary | ICD-10-CM | POA: Diagnosis not present

## 2020-06-16 DIAGNOSIS — R278 Other lack of coordination: Secondary | ICD-10-CM | POA: Diagnosis not present

## 2020-06-16 DIAGNOSIS — R42 Dizziness and giddiness: Secondary | ICD-10-CM | POA: Diagnosis not present

## 2020-06-16 DIAGNOSIS — M6281 Muscle weakness (generalized): Secondary | ICD-10-CM | POA: Diagnosis not present

## 2020-06-16 DIAGNOSIS — R2689 Other abnormalities of gait and mobility: Secondary | ICD-10-CM | POA: Diagnosis not present

## 2020-06-16 NOTE — Therapy (Signed)
[facility] [address] [place], [place], [address] Phone: [phone]   Fax:  [phone]  Physical Therapy Treatment  Patient Details  Name: Carlos Phillips MRN: [id number] Date of Birth: 01/25/1966 Referring Provider (PT): Frann Rider (being followed by, referred by hospitalist)   Encounter Date: 06/16/2020   PT End of Session - 06/16/20 2041    Visit Number 6    Number of Visits 13    Authorization Type BCBS - VL: 30 PT/OT/ST combined - each visit of each type counts    Authorization - Visit Number 6    Authorization - Number of Visits 30    PT Start Time [phone]    PT Stop Time 0931    PT Time Calculation (min) 45 min    Activity Tolerance Patient tolerated treatment well   limited by high BP   Behavior During Therapy [facility] for tasks assessed/performed           Past Medical History:  Diagnosis Date  . Diabetes mellitus without complication ([place])   . DVT (deep venous thrombosis) ([place]) 02/2013  . Erectile dysfunction   . Hyperlipidemia   . Hypertension   . Obesity    BMI >52  . PVD (peripheral vascular disease) ([place])     Past Surgical History:  Procedure Laterality Date  . ABDOMINAL AORTAGRAM N/A 02/28/2013   Procedure: ABDOMINAL Maxcine Ham;  Surgeon: Conrad Elsie, MD;  Location: [facility] CATH LAB;  Service: Cardiovascular;  Laterality: N/A;  . COLONOSCOPY WITH PROPOFOL N/A 12/25/2015   Procedure: COLONOSCOPY WITH PROPOFOL;  Surgeon: Manus Gunning, MD;  Location: [facility] ENDOSCOPY;  Service: Gastroenterology;  Laterality: N/A;  . EMBOLECTOMY Left 02/25/2013   Procedure: Left Popliteal EMBOLECTOMY Poss fasciotomy;  Surgeon: Elam Dutch, MD;  Location: [facility] OR;  Service: Vascular;  Laterality: Left;  Left poplital and Tibial embolectomy with four compartment Fasciotomy with vein patch angioplasty left popliteal artery.    Vitals:   06/16/20 0851  BP: 138/90  Pulse: 74     Subjective Assessment -  06/16/20 2028    Subjective Pt reports he was running late this am - missed his OT appt scheduled before PT today: pt states he has not had much dizziness since last session and has been doing exercises at home (standing on pillow)    Pertinent History PVD, obesity, HTN, DVT, DM    Limitations Walking    How long can you stand comfortably? 10-15 minutes after standing up in the kitchen    Diagnostic tests MRI of brain showed small acute infarct in the midbrain in the expected region of the Rt oculomotor nucleus as well as a small remote lacunar infarct in the Rt thalamus.    Patient Stated Goals wants to improve the balance    Currently in Pain? No/denies                             [facility] Adult PT Treatment/Exercise - 06/16/20 0859      Ambulation/Gait   Ambulation/Gait Yes    Ambulation/Gait Assistance 5: Supervision    Ambulation Distance (Feet) 230 Feet    Assistive device None    Gait Pattern Step-through pattern    Ambulation Surface Level;Indoor    Gait Comments fast speed with sudden stop and turns incorporated           Vestibular Treatment/Exercise - 06/16/20 0001      Vestibular Treatment/Exercise  Gaze Exercises X1 Viewing Horizontal;X1 Viewing Vertical      X1 Viewing Horizontal   Foot Position bil. stance    Time --   30 secs   Reps 2    Comments 30 secs with target on plain background, then 30 secs on patterned background      X1 Viewing Vertical   Foot Position bil. stance    Time --   30 secs   Reps 2    Comments 30 secs with target on plain background, then on patterned background          Pt stood on 2 pillows - made circles clockwise and counterclockwise, then 'V" and "+" patterns with ball - tracking with eyes and Moving head - this provoked dizziness - pt needed short seated rest period  At end of session, pt amb. 35' making circles clockwise and counterclockwise with ball - following with eyes and head moving - this Provoked  nausea/ some mild emesis - session was stopped due to time (end of session) but also due to symptoms provoked    Balance Exercises - 06/16/20 0001      Balance Exercises: Standing   Standing Eyes Opened Narrow base of support (BOS);Wide (BOA);Head turns;Foam/compliant surface;5 reps   horizontal and vertical head turns   Standing Eyes Closed Narrow base of support (BOS);Wide (BOA);Head turns;Foam/compliant surface;5 reps   horizontal and vertical head turns   Rockerboard Anterior/posterior;Head turns;EO;EC;10 reps;Intermittent UE support;UE support   standing on rocker board - performing head turns side to side and then up/down 10 reps each with UE support prn on // bars   Marching Foam/compliant surface;Head turns;Static;10 reps   EO and EC   Other Standing Exercises standing on 2 pillows in corner - pt performed trunk rotations - side to side and diagonals 5 reps each direction with EO and then with EC with CGA for safety    Other Standing Exercises Comments Marching on incline - on blue mat with EO and with EC; EO with horizontal and vertical head turns 5 reps each direction             PT Education - 06/16/20 2040    Education Details instructed pt to add gaze stabilization exercise - holding object and making circles clockwise and counterclockwise - following with eyes and head moving - 5 reps each direction    Person(s) Educated Patient    Methods Explanation;Demonstration    Comprehension Verbalized understanding;Returned demonstration            PT Short Term Goals - 06/16/20 2049      PT SHORT TERM GOAL #1   Title Pt will be independent with initial HEP in order to build upon functional gains made in therapy. ALL STGS DUE 06/03/20    Baseline pt reports performing HEP consistently at home.    Time 3    Period Weeks    Status Achieved    Target Date 06/03/20      PT SHORT TERM GOAL #2   Title Pt will undergo further vestibular assessment with LTG to be written as  appropriate.    Time 3    Period Weeks    Status New    Target Date 06/03/20      PT SHORT TERM GOAL #3   Title Pt will undergo DGI/FGA with LTG as appropriate to demo improved balance and decr fall risk.    Baseline LTG written (05/19/20)    Time 3    Period  Weeks    Status Achieved      PT SHORT TERM GOAL #4   Title Pt will improve 5x sit <> stand time to at least 29 seconds or less with BUE support in order to demo improved functional BLE strength.    Baseline 34.75 seconds, 13.88 seconds with no UE support on 06/09/20    Time 3    Period Weeks    Status Achieved             PT Long Term Goals - 06/16/20 2049      PT LONG TERM GOAL #1   Title Pt will be independent with final HEP in order to build upon functional gains made in therapy.    Time 6    Period Weeks    Status New      PT LONG TERM GOAL #2   Title Vestibular goal to be written as appropriate based on vestibular assessment.    Time 6    Period Weeks    Status New      PT LONG TERM GOAL #3   Title Pt will score 30/30 on FGA to improve functional gait and balance.    Baseline 26/30 (05/19/20)    Time 6    Period Weeks    Status New      PT LONG TERM GOAL #4   Title Pt will ambulate at least 230' with supervision with scanning environment with no reports of feeling dizziness/off balance in order to demo improved functional mobility.    Time 6    Period Weeks    Status New      PT LONG TERM GOAL #5   Title Pt will improve 5x sit <> stand time to at least 25 seconds or less with BUE support and RPE less than 4/10 in order to demo improved functional BLE strength.    Baseline 34.75 seconds    Time 6    Period Weeks    Status New                 Plan - 06/16/20 2041    Clinical Impression Statement Pt continues to progress with improving mobility but demonstrates decreased vestibular input in maintaining balance as evidenced by LOB/unsteadiness with standing activities with EC and on compliant  or moving surfaces.  Pt also has difficulty performing oculomotor activities with amb. or with standing on compliant surfaces as this provokes dizziness.  Pt had c/o nausea and emesis at end of session but felt better after seated rest period and drinking some water. Cont with POC.    Personal Factors and Comorbidities Comorbidity 3+;Profession;Past/Current Experience    Comorbidities PVD, obesity, HTN, DVT, DM    Examination-Activity Limitations Squat;Transfers;Stairs;Locomotion Level;Stand;Sit    Examination-Participation Restrictions Community Activity;Occupation;Cleaning    Stability/Clinical Decision Making Evolving/Moderate complexity    Rehab Potential Good    PT Frequency 2x / week    PT Duration 6 weeks    PT Treatment/Interventions ADLs/Self Care Home Management;Canalith Repostioning;Gait training;Stair training;Therapeutic activities;Functional mobility training;Therapeutic exercise;Balance training;Neuromuscular re-education;Patient/family education;Vasopneumatic Device;Vestibular    PT Next Visit Plan continue to monitor BP (better with manual cuff). vestibular assessment if appropriate (pt reporting no dizziness on 06/09/20 during session), Work on turns, balance with vision removed and on compliant surfaces, endurance/activity tolerance.    PT Home Exercise Plan other access code: [facility]         Access Code: [facility]  URL: [email or web address]  Date: 05/19/2020  Prepared by: Markus Jarvis  Exercises  Sit to Stand with Arms Crossed - 1 x daily - 7 x weekly - 2 sets - 10 reps  Standing with half turns-Quick - 1 x daily - 7 x weekly - 1 sets - 3-5 reps    Consulted and Agree with Plan of Care Patient           Patient will benefit from skilled therapeutic intervention in order to improve the following deficits and impairments:  Abnormal gait,Decreased balance,Decreased activity tolerance,Decreased endurance,Decreased strength,Difficulty  walking,Dizziness,Obesity  Visit Diagnosis: Dizziness and giddiness  Unsteadiness on feet     Problem List Patient Active Problem List   Diagnosis Date Noted  . Diplopia   . Ophthalmoplegia of right eye   . CVA (cerebral vascular accident) ([place]) 03/24/2020  . Acute CVA (cerebrovascular accident) ([place]) 03/24/2020  . Hyperlipidemia 12/31/2016  . PVD (peripheral vascular disease) ([place]) 01/29/2016  . Diverticulitis 01/28/2016  . Special screening for malignant neoplasms, colon   . Type 2 diabetes mellitus with diabetic dermatitis, without long-term current use of insulin ([place]) 05/03/2015  . History of deep vein thrombosis (DVT) of lower extremity 03/07/2013  . Ischemia of extremity 02/25/2013  . HTN (hypertension) 05/07/2011  . Erectile dysfunction 05/07/2011  . Obesity 05/07/2011    Alda Lea, PT 06/16/2020, 8:52 PM  [facility] [address] [place], [place], [address] Phone: [phone]   Fax:  [phone]  Name: Carlos Phillips MRN: [id number] Date of Birth: 07/21/1965

## 2020-06-18 ENCOUNTER — Ambulatory Visit: Payer: BC Managed Care – PPO | Admitting: Physical Therapy

## 2020-06-18 ENCOUNTER — Other Ambulatory Visit: Payer: Self-pay

## 2020-06-18 ENCOUNTER — Encounter: Payer: Self-pay | Admitting: Physical Therapy

## 2020-06-18 VITALS — BP 130/94 | HR 79

## 2020-06-18 DIAGNOSIS — R42 Dizziness and giddiness: Secondary | ICD-10-CM | POA: Diagnosis not present

## 2020-06-18 DIAGNOSIS — R278 Other lack of coordination: Secondary | ICD-10-CM | POA: Diagnosis not present

## 2020-06-18 DIAGNOSIS — M6281 Muscle weakness (generalized): Secondary | ICD-10-CM | POA: Diagnosis not present

## 2020-06-18 DIAGNOSIS — R2689 Other abnormalities of gait and mobility: Secondary | ICD-10-CM

## 2020-06-18 DIAGNOSIS — R2681 Unsteadiness on feet: Secondary | ICD-10-CM | POA: Diagnosis not present

## 2020-06-18 NOTE — Therapy (Signed)
[facility] [address] [place], [place], [address] Phone: [phone]   Fax:  [phone]  Physical Therapy Treatment  Patient Details  Name: Carlos Phillips MRN: [id number] Date of Birth: 1965/06/16 Referring Provider (PT): Frann Rider (being followed by, referred by hospitalist)   Encounter Date: 06/18/2020   PT End of Session - 06/18/20 0947    Visit Number 7    Number of Visits 13    Authorization Type BCBS - VL: 30 PT/OT/ST combined - each visit of each type counts    Authorization - Visit Number 7    Authorization - Number of Visits 30    PT Start Time 1884    PT Stop Time 0931    PT Time Calculation (min) 44 min    Equipment Utilized During Treatment Gait belt    Activity Tolerance Patient tolerated treatment well    Behavior During Therapy [facility] for tasks assessed/performed           Past Medical History:  Diagnosis Date  . Diabetes mellitus without complication ([place])   . DVT (deep venous thrombosis) ([place]) 02/2013  . Erectile dysfunction   . Hyperlipidemia   . Hypertension   . Obesity    BMI >52  . PVD (peripheral vascular disease) ([place])     Past Surgical History:  Procedure Laterality Date  . ABDOMINAL AORTAGRAM N/A 02/28/2013   Procedure: ABDOMINAL Maxcine Ham;  Surgeon: Conrad Alamo, MD;  Location: [facility] CATH LAB;  Service: Cardiovascular;  Laterality: N/A;  . COLONOSCOPY WITH PROPOFOL N/A 12/25/2015   Procedure: COLONOSCOPY WITH PROPOFOL;  Surgeon: Manus Gunning, MD;  Location: [facility] ENDOSCOPY;  Service: Gastroenterology;  Laterality: N/A;  . EMBOLECTOMY Left 02/25/2013   Procedure: Left Popliteal EMBOLECTOMY Poss fasciotomy;  Surgeon: Elam Dutch, MD;  Location: [facility] OR;  Service: Vascular;  Laterality: Left;  Left poplital and Tibial embolectomy with four compartment Fasciotomy with vein patch angioplasty left popliteal artery.    Vitals:   06/18/20 0856  BP: (!) 130/94  Pulse: 79      Subjective Assessment - 06/18/20 0852    Subjective Tried the exercises at home with using a paper towel and tracking it. When he started to get nauseous, he stopped the exercise and it wasn't as bad.    Pertinent History PVD, obesity, HTN, DVT, DM    Limitations Walking    How long can you stand comfortably? 10-15 minutes after standing up in the kitchen    Diagnostic tests MRI of brain showed small acute infarct in the midbrain in the expected region of the Rt oculomotor nucleus as well as a small remote lacunar infarct in the Rt thalamus.    Patient Stated Goals wants to improve the balance    Currently in Pain? No/denies                                  Balance Exercises - 06/18/20 0001      Balance Exercises: Standing   Standing Eyes Closed Narrow base of support (BOS);Wide (BOA);Head turns;Foam/compliant surface    Standing Eyes Closed Limitations x30 seconds static holds, x10 reps head turns, x10 reps head nods, x10 reps diagnonal head turns up to R, 2 x 10 reps diagonals up to L first (pt with incr difficulty initially, improved with incr reps)    Other Standing Exercises on blue air ex in corner: lateral weight shifting with  eyes closed x5 reps, marching with eyes closed x5 reps (cues to perform slowly) - then performed with 5 head nods x1 rep, and then 5 head turns x1 rep, incr difficulty with head nods    Other Standing Exercises Comments stood on blue air ex with feet apart- made circles clockwise and counterclockwise x5 reps of each, "+" patterns with ball x5 reps, diagonals up/down each direction x5 reps, pt with no reports of dizziness/nausea while performing           NMR: Forward gait with tracking ball in clockwise/counterclock wise directions 4 x 25'. Pt with initial reports of dizziness, but it subsided after first rep.  Retro gait with tracking ball in clockwise direction 2 x 25'. Slowed gait speed, but not dizziness.    Standing on blue  compliant mat: slow walking with eyes closed with head turns x2 reps, and then performed with head nods x2 reps, no dizziness, pt just reporting feeling more off balance with head nods.       PT Short Term Goals - 06/16/20 2049      PT SHORT TERM GOAL #1   Title Pt will be independent with initial HEP in order to build upon functional gains made in therapy. ALL STGS DUE 06/03/20    Baseline pt reports performing HEP consistently at home.    Time 3    Period Weeks    Status Achieved    Target Date 06/03/20      PT SHORT TERM GOAL #2   Title Pt will undergo further vestibular assessment with LTG to be written as appropriate.    Time 3    Period Weeks    Status New    Target Date 06/03/20      PT SHORT TERM GOAL #3   Title Pt will undergo DGI/FGA with LTG as appropriate to demo improved balance and decr fall risk.    Baseline LTG written (05/19/20)    Time 3    Period Weeks    Status Achieved      PT SHORT TERM GOAL #4   Title Pt will improve 5x sit <> stand time to at least 29 seconds or less with BUE support in order to demo improved functional BLE strength.    Baseline 34.75 seconds, 13.88 seconds with no UE support on 06/09/20    Time 3    Period Weeks    Status Achieved             PT Long Term Goals - 06/16/20 2049      PT LONG TERM GOAL #1   Title Pt will be independent with final HEP in order to build upon functional gains made in therapy.    Time 6    Period Weeks    Status New      PT LONG TERM GOAL #2   Title Vestibular goal to be written as appropriate based on vestibular assessment.    Time 6    Period Weeks    Status New      PT LONG TERM GOAL #3   Title Pt will score 30/30 on FGA to improve functional gait and balance.    Baseline 26/30 (05/19/20)    Time 6    Period Weeks    Status New      PT LONG TERM GOAL #4   Title Pt will ambulate at least 230' with supervision with scanning environment with no reports of feeling dizziness/off balance in  order to  demo improved functional mobility.    Time 6    Period Weeks    Status New      PT LONG TERM GOAL #5   Title Pt will improve 5x sit <> stand time to at least 25 seconds or less with BUE support and RPE less than 4/10 in order to demo improved functional BLE strength.    Baseline 34.75 seconds    Time 6    Period Weeks    Status New                 Plan - 06/18/20 0947    Clinical Impression Statement Today's skilled session focused on balance strategies for incr vestibular input and tracking activities on compliant surfaces and with more dynamic gait tasks. Pt with no episodes of nausea today and only instances of slight dizziness that subsided quickly. Pt with much better tolerance of oculomotor activities today. Seated rest breaks taken throughout. Pt remains very motivated, will continue to progress towards LTGs.    Personal Factors and Comorbidities Comorbidity 3+;Profession;Past/Current Experience    Comorbidities PVD, obesity, HTN, DVT, DM    Examination-Activity Limitations Squat;Transfers;Stairs;Locomotion Level;Stand;Sit    Examination-Participation Restrictions Community Activity;Occupation;Cleaning    Stability/Clinical Decision Making Evolving/Moderate complexity    Rehab Potential Good    PT Frequency 2x / week    PT Duration 6 weeks    PT Treatment/Interventions ADLs/Self Care Home Management;Canalith Repostioning;Gait training;Stair training;Therapeutic activities;Functional mobility training;Therapeutic exercise;Balance training;Neuromuscular re-education;Patient/family education;Vasopneumatic Device;Vestibular    PT Next Visit Plan continue balance on compliant surfaces for incr vestibular input, visual tracking and oculomotor activities. pt reports feeling nauseous when scrolling on his phone and when looking out the window in the car. next week is last week in POC, will have to determine plan going forward.    PT Home Exercise Plan other access code:  [facility]         Access Code: [facility]  URL: [email or web address]  Date: 05/19/2020  Prepared by: Markus Jarvis    Exercises  Sit to Stand with Arms Crossed - 1 x daily - 7 x weekly - 2 sets - 10 reps  Standing with half turns-Quick - 1 x daily - 7 x weekly - 1 sets - 3-5 reps    Consulted and Agree with Plan of Care Patient           Patient will benefit from skilled therapeutic intervention in order to improve the following deficits and impairments:  Abnormal gait,Decreased balance,Decreased activity tolerance,Decreased endurance,Decreased strength,Difficulty walking,Dizziness,Obesity  Visit Diagnosis: Dizziness and giddiness  Unsteadiness on feet  Other abnormalities of gait and mobility     Problem List Patient Active Problem List   Diagnosis Date Noted  . Diplopia   . Ophthalmoplegia of right eye   . CVA (cerebral vascular accident) ([place]) 03/24/2020  . Acute CVA (cerebrovascular accident) ([place]) 03/24/2020  . Hyperlipidemia 12/31/2016  . PVD (peripheral vascular disease) ([place]) 01/29/2016  . Diverticulitis 01/28/2016  . Special screening for malignant neoplasms, colon   . Type 2 diabetes mellitus with diabetic dermatitis, without long-term current use of insulin ([place]) 05/03/2015  . History of deep vein thrombosis (DVT) of lower extremity 03/07/2013  . Ischemia of extremity 02/25/2013  . HTN (hypertension) 05/07/2011  . Erectile dysfunction 05/07/2011  . Obesity 05/07/2011    Arliss Journey, PT, DPT  06/18/2020, 9:53 AM  [facility] [address] [place], [place], [address] Phone: [phone]   Fax:  [phone]  Name: Carlos Phillips MRN: [id number] Date of Birth: 11/27/1965

## 2020-06-23 ENCOUNTER — Ambulatory Visit: Payer: BC Managed Care – PPO | Admitting: Occupational Therapy

## 2020-06-23 ENCOUNTER — Encounter: Payer: Self-pay | Admitting: Physical Therapy

## 2020-06-23 ENCOUNTER — Ambulatory Visit: Payer: BC Managed Care – PPO | Admitting: Physical Therapy

## 2020-06-23 ENCOUNTER — Other Ambulatory Visit: Payer: Self-pay

## 2020-06-23 DIAGNOSIS — R2689 Other abnormalities of gait and mobility: Secondary | ICD-10-CM | POA: Diagnosis not present

## 2020-06-23 DIAGNOSIS — R42 Dizziness and giddiness: Secondary | ICD-10-CM | POA: Diagnosis not present

## 2020-06-23 DIAGNOSIS — R2681 Unsteadiness on feet: Secondary | ICD-10-CM | POA: Diagnosis not present

## 2020-06-23 DIAGNOSIS — R278 Other lack of coordination: Secondary | ICD-10-CM | POA: Diagnosis not present

## 2020-06-23 DIAGNOSIS — M6281 Muscle weakness (generalized): Secondary | ICD-10-CM | POA: Diagnosis not present

## 2020-06-24 ENCOUNTER — Other Ambulatory Visit: Payer: Self-pay | Admitting: Family Medicine

## 2020-06-24 DIAGNOSIS — N529 Male erectile dysfunction, unspecified: Secondary | ICD-10-CM

## 2020-06-24 NOTE — Therapy (Signed)
[facility] [address] [place], [place], [address] Phone: [phone]   Fax:  [phone]  Physical Therapy Treatment  Patient Details  Name: Carlos Phillips MRN: [id number] Date of Birth: 1965/10/20 Referring Provider (PT): Frann Rider (being followed by, referred by hospitalist)   Encounter Date: 06/23/2020   PT End of Session - 06/24/20 1946    Visit Number 8    Number of Visits 13    Authorization Type BCBS - VL: 30 PT/OT/ST combined - each visit of each type counts    Authorization - Visit Number 8    Authorization - Number of Visits 30    PT Start Time 0850    PT Stop Time 0930    PT Time Calculation (min) 40 min    Equipment Utilized During Treatment --    Activity Tolerance Patient tolerated treatment well    Behavior During Therapy [facility] for tasks assessed/performed           Past Medical History:  Diagnosis Date  . Diabetes mellitus without complication ([place])   . DVT (deep venous thrombosis) ([place]) 02/2013  . Erectile dysfunction   . Hyperlipidemia   . Hypertension   . Obesity    BMI >52  . PVD (peripheral vascular disease) ([place])     Past Surgical History:  Procedure Laterality Date  . ABDOMINAL AORTAGRAM N/A 02/28/2013   Procedure: ABDOMINAL Maxcine Ham;  Surgeon: Conrad Blanco, MD;  Location: [facility] CATH LAB;  Service: Cardiovascular;  Laterality: N/A;  . COLONOSCOPY WITH PROPOFOL N/A 12/25/2015   Procedure: COLONOSCOPY WITH PROPOFOL;  Surgeon: Manus Gunning, MD;  Location: [facility] ENDOSCOPY;  Service: Gastroenterology;  Laterality: N/A;  . EMBOLECTOMY Left 02/25/2013   Procedure: Left Popliteal EMBOLECTOMY Poss fasciotomy;  Surgeon: Elam Dutch, MD;  Location: [facility] OR;  Service: Vascular;  Laterality: Left;  Left poplital and Tibial embolectomy with four compartment Fasciotomy with vein patch angioplasty left popliteal artery.    There were no vitals filed for this  visit.                      [place] Adult PT Treatment/Exercise - 06/24/20 0001      High Level Balance   High Level Balance Activities Turns;Sudden stops;Head turns               Balance Exercises - 06/24/20 0001      Balance Exercises: Standing   Standing Eyes Opened Wide (BOA);Head turns;Foam/compliant surface;5 reps   targets - horizontal and vertical   Standing Eyes Closed Wide (BOA);Head turns;Foam/compliant surface;5 reps   horizontal and vertical head turns   Rockerboard Anterior/posterior;EO;EC;10 reps;Intermittent UE support    Marching Foam/compliant surface;Head turns;Static;10 reps   EO and EC   Other Standing Exercises standing on 2 pillows in corner - pt performed trunk rotations - side to side and diagonals 5 reps each direction with EO and then with EC with CGA for safety    Other Standing Exercises Comments pt performed amb. tossing ball straight up approx. 40' x 2 reps; then tossing and catching on Rt/Lt sides 40' x 2 reps; pt amb. making patterns with ball improved gaze stabilization - letters "O" both clockwise & counterclockwise, "X" and "+"             PT Education - 06/24/20 1945    Education Details pt to add visual tracking of ball to his HEP - standing on pillows at home; progressed x1 viewing  from plain to patterned background    Person(s) Educated Patient    Methods Explanation;Demonstration    Comprehension Verbalized understanding;Returned demonstration            PT Short Term Goals - 06/24/20 1952      PT SHORT TERM GOAL #1   Title Pt will be independent with initial HEP in order to build upon functional gains made in therapy. ALL STGS DUE 06/03/20    Baseline pt reports performing HEP consistently at home.    Time 3    Period Weeks    Status Achieved    Target Date 06/03/20      PT SHORT TERM GOAL #2   Title Pt will undergo further vestibular assessment with LTG to be written as appropriate.    Time 3    Period Weeks     Status New    Target Date 06/03/20      PT SHORT TERM GOAL #3   Title Pt will undergo DGI/FGA with LTG as appropriate to demo improved balance and decr fall risk.    Baseline LTG written (05/19/20)    Time 3    Period Weeks    Status Achieved      PT SHORT TERM GOAL #4   Title Pt will improve 5x sit <> stand time to at least 29 seconds or less with BUE support in order to demo improved functional BLE strength.    Baseline 34.75 seconds, 13.88 seconds with no UE support on 06/09/20    Time 3    Period Weeks    Status Achieved             PT Long Term Goals - 06/23/20 0908      PT LONG TERM GOAL #1   Title Pt will be independent with final HEP in order to build upon functional gains made in therapy.    Time 6    Period Weeks    Status New      PT LONG TERM GOAL #2   Title Vestibular goal to be written as appropriate based on vestibular assessment.    Time 6    Period Weeks    Status New      PT LONG TERM GOAL #3   Title Pt will score 30/30 on FGA to improve functional gait and balance.    Baseline 26/30 (05/19/20)    Time 6    Period Weeks    Status New      PT LONG TERM GOAL #4   Title Pt will ambulate at least 230' with supervision with scanning environment with no reports of feeling dizziness/off balance in order to demo improved functional mobility.    Time 6    Period Weeks    Status New      PT LONG TERM GOAL #5   Title Pt will improve 5x sit <> stand time to at least 25 seconds or less with BUE support and RPE less than 4/10 in order to demo improved functional BLE strength.    Baseline 34.75 seconds    Time 6    Period Weeks    Status New                 Plan - 06/24/20 1947    Clinical Impression Statement Pt progressing very well with balance with increased tolerance for visual/vestibular activities.  Pt reported only slight possible onset of nausea with dynamic gait with visual activities incorportated, but able to take short  rest break and  symptoms subsided.  DVA tested as 1 line difference (WNL's) but pt continues to have most dizziness with head and eye movements during gait.  Cont with POC.    Personal Factors and Comorbidities Comorbidity 3+;Profession;Past/Current Experience    Comorbidities PVD, obesity, HTN, DVT, DM    Examination-Activity Limitations Squat;Transfers;Stairs;Locomotion Level;Stand;Sit    Examination-Participation Restrictions Community Activity;Occupation;Cleaning    Stability/Clinical Decision Making Evolving/Moderate complexity    Rehab Potential Good    PT Frequency 2x / week    PT Duration 6 weeks    PT Treatment/Interventions ADLs/Self Care Home Management;Canalith Repostioning;Gait training;Stair training;Therapeutic activities;Functional mobility training;Therapeutic exercise;Balance training;Neuromuscular re-education;Patient/family education;Vasopneumatic Device;Vestibular    PT Next Visit Plan Check LTG's - renew for few additional visits  - continue balance on compliant surfaces for incr vestibular input, visual tracking and oculomotor activities. pt reports feeling nauseous when scrolling on his phone and when looking out the window in the car. next week is last week in POC, will have to determine plan going forward.    PT Home Exercise Plan other access code: [facility]         Access Code: [facility]  URL: [email or web address]  Date: 05/19/2020  Prepared by: Markus Jarvis    Exercises  Sit to Stand with Arms Crossed - 1 x daily - 7 x weekly - 2 sets - 10 reps  Standing with half turns-Quick - 1 x daily - 7 x weekly - 1 sets - 3-5 reps    Consulted and Agree with Plan of Care Patient           Patient will benefit from skilled therapeutic intervention in order to improve the following deficits and impairments:  Abnormal gait,Decreased balance,Decreased activity tolerance,Decreased endurance,Decreased strength,Difficulty walking,Dizziness,Obesity  Visit Diagnosis: Dizziness and  giddiness  Unsteadiness on feet     Problem List Patient Active Problem List   Diagnosis Date Noted  . Diplopia   . Ophthalmoplegia of right eye   . CVA (cerebral vascular accident) ([place]) 03/24/2020  . Acute CVA (cerebrovascular accident) ([place]) 03/24/2020  . Hyperlipidemia 12/31/2016  . PVD (peripheral vascular disease) ([place]) 01/29/2016  . Diverticulitis 01/28/2016  . Special screening for malignant neoplasms, colon   . Type 2 diabetes mellitus with diabetic dermatitis, without long-term current use of insulin ([place]) 05/03/2015  . History of deep vein thrombosis (DVT) of lower extremity 03/07/2013  . Ischemia of extremity 02/25/2013  . HTN (hypertension) 05/07/2011  . Erectile dysfunction 05/07/2011  . Obesity 05/07/2011    [place], PT 06/24/2020, 7:53 PM  [facility] [address] [place], [place], [address] Phone: [phone]   Fax:  [phone]  Name: TYLEEK SMICK MRN: [id number] Date of Birth: May 01, 1965

## 2020-06-24 NOTE — Telephone Encounter (Signed)
Sildenafil refilled, use lowest effective dose.  Should discuss at his next visit within the next 6 months.

## 2020-06-25 ENCOUNTER — Other Ambulatory Visit: Payer: Self-pay

## 2020-06-25 ENCOUNTER — Ambulatory Visit: Payer: BC Managed Care – PPO | Admitting: Physical Therapy

## 2020-06-25 ENCOUNTER — Encounter: Payer: Self-pay | Admitting: Physical Therapy

## 2020-06-25 DIAGNOSIS — M6281 Muscle weakness (generalized): Secondary | ICD-10-CM

## 2020-06-25 DIAGNOSIS — R2681 Unsteadiness on feet: Secondary | ICD-10-CM | POA: Diagnosis not present

## 2020-06-25 DIAGNOSIS — R2689 Other abnormalities of gait and mobility: Secondary | ICD-10-CM

## 2020-06-25 DIAGNOSIS — R42 Dizziness and giddiness: Secondary | ICD-10-CM

## 2020-06-25 DIAGNOSIS — R278 Other lack of coordination: Secondary | ICD-10-CM | POA: Diagnosis not present

## 2020-06-25 NOTE — Therapy (Signed)
[facility] [address] [place], [place], [address] Phone: [phone]   Fax:  [phone]  Physical Therapy Treatment/Re-Cert  Patient Details  Name: Carlos Phillips MRN: [id number] Date of Birth: 1965/08/19 Referring Provider (PT): Frann Rider   Encounter Date: 06/25/2020   PT End of Session - 06/25/20 1013    Visit Number 9    Number of Visits 15    Authorization Type BCBS - VL: 30 PT/OT/ST combined - each visit of each type counts    Authorization - Visit Number 9    Authorization - Number of Visits 30    PT Start Time [phone]   pt late to appt   PT Stop Time 0932    PT Time Calculation (min) 39 min    Activity Tolerance Patient tolerated treatment well    Behavior During Therapy [facility] for tasks assessed/performed           Past Medical History:  Diagnosis Date  . Diabetes mellitus without complication ([place])   . DVT (deep venous thrombosis) ([place]) 02/2013  . Erectile dysfunction   . Hyperlipidemia   . Hypertension   . Obesity    BMI >52  . PVD (peripheral vascular disease) ([place])     Past Surgical History:  Procedure Laterality Date  . ABDOMINAL AORTAGRAM N/A 02/28/2013   Procedure: ABDOMINAL Maxcine Ham;  Surgeon: Conrad [place], MD;  Location: [facility] CATH LAB;  Service: Cardiovascular;  Laterality: N/A;  . COLONOSCOPY WITH PROPOFOL N/A 12/25/2015   Procedure: COLONOSCOPY WITH PROPOFOL;  Surgeon: Manus Gunning, MD;  Location: [facility] ENDOSCOPY;  Service: Gastroenterology;  Laterality: N/A;  . EMBOLECTOMY Left 02/25/2013   Procedure: Left Popliteal EMBOLECTOMY Poss fasciotomy;  Surgeon: Elam Dutch, MD;  Location: [facility] OR;  Service: Vascular;  Laterality: Left;  Left poplital and Tibial embolectomy with four compartment Fasciotomy with vein patch angioplasty left popliteal artery.    There were no vitals filed for this visit.   Subjective Assessment - 06/25/20 0855    Subjective Still having some  diziness/nausea when scrolling on his phone - rating it as 4/10 and incr sx when walking and tracking. Can go away after stopping for a couple of seconds. Exercises are going well at home.    Pertinent History PVD, obesity, HTN, DVT, DM    Limitations Walking    How long can you stand comfortably? 10-15 minutes after standing up in the kitchen    Diagnostic tests MRI of brain showed small acute infarct in the midbrain in the expected region of the Rt oculomotor nucleus as well as a small remote lacunar infarct in the Rt thalamus.    Patient Stated Goals wants to improve the balance    Currently in Pain? Yes   "just feeling a little bit of nausea"             [facility] PT Assessment - 06/25/20 0911      Assessment   Medical Diagnosis midbrain infarct   region of Rt oculomotor nucleus   Referring Provider (PT) Frann Rider      Prior Function   Level of Independence Independent      Observation/Other Assessments   Focus on Therapeutic Outcomes (FOTO)  66    Other Surveys  Dizziness Handicap Inventory (DHI)    Dizziness Handicap Inventory (DHI)  26 = mild handicap      Functional Gait  Assessment   Gait assessed  Yes    Gait Level Surface Walks 20  ft in less than 5.5 sec, no assistive devices, good speed, no evidence for imbalance, normal gait pattern, deviates no more than 6 in outside of the 12 in walkway width.    Change in Gait Speed Able to smoothly change walking speed without loss of balance or gait deviation. Deviate no more than 6 in outside of the 12 in walkway width.    Gait with Horizontal Head Turns Performs head turns smoothly with no change in gait. Deviates no more than 6 in outside 12 in walkway width    Gait with Vertical Head Turns Performs head turns with no change in gait. Deviates no more than 6 in outside 12 in walkway width.    Gait and Pivot Turn Pivot turns safely within 3 sec and stops quickly with no loss of balance.    Step Over Obstacle Is able to step over 2  stacked shoe boxes taped together (9 in total height) without changing gait speed. No evidence of imbalance.    Gait with Narrow Base of Support Ambulates 4-7 steps.    Gait with Eyes Closed Walks 20 ft, uses assistive device, slower speed, mild gait deviations, deviates 6-10 in outside 12 in walkway width. Ambulates 20 ft in less than 9 sec but greater than 7 sec.    Ambulating Backwards Walks 20 ft, no assistive devices, good speed, no evidence for imbalance, normal gait    Steps Alternating feet, no rail.    Total Score 27    FGA comment: 27/30                         [facility] Adult PT Treatment/Exercise - 06/25/20 1012      Ambulation/Gait   Ambulation/Gait Yes    Ambulation/Gait Assistance 5: Supervision    Ambulation/Gait Assistance Details with scanning environment, no LOB, pt reporting feeling unsteady more so with head motions    Gait Pattern Step-through pattern    Ambulation Surface Level;Indoor           NMR: -forward gait tracking ball in V direction - 2 x 30'  -retro gait tracking ball in V direction - 4 x 25', pt reporting at first 5/10 dizziness and nausea, decr with incr reps and pt reporting no sx  -forward gait x4 reps x30' tracking in S shape - pt at first reporting 4/10 sx and then none with incr reps            PT Short Term Goals - 06/24/20 1952      PT SHORT TERM GOAL #1   Title Pt will be independent with initial HEP in order to build upon functional gains made in therapy. ALL STGS DUE 06/03/20    Baseline pt reports performing HEP consistently at home.    Time 3    Period Weeks    Status Achieved    Target Date 06/03/20      PT SHORT TERM GOAL #2   Title Pt will undergo further vestibular assessment with LTG to be written as appropriate.    Time 3    Period Weeks    Status New    Target Date 06/03/20      PT SHORT TERM GOAL #3   Title Pt will undergo DGI/FGA with LTG as appropriate to demo improved balance and decr fall risk.     Baseline LTG written (05/19/20)    Time 3    Period Weeks    Status Achieved  PT SHORT TERM GOAL #4   Title Pt will improve 5x sit <> stand time to at least 29 seconds or less with BUE support in order to demo improved functional BLE strength.    Baseline 34.75 seconds, 13.88 seconds with no UE support on 06/09/20    Time 3    Period Weeks    Status Achieved             PT Long Term Goals - 06/25/20 0905      PT LONG TERM GOAL #1   Title Pt will be independent with final HEP in order to build upon functional gains made in therapy.    Baseline pt reports consistently performed HEP.    Time 6    Period Weeks    Status Achieved      PT LONG TERM GOAL #2   Title Vestibular goal to be written as appropriate based on vestibular assessment.    Time 6    Period Weeks    Status New      PT LONG TERM GOAL #3   Title Pt will score 30/30 on FGA to improve functional gait and balance.    Baseline 26/30 (05/19/20), 27/30 on 06/25/20    Time 6    Period Weeks    Status Not Met      PT LONG TERM GOAL #4   Title Pt will ambulate at least 230' with supervision with scanning environment with no reports of feeling dizziness/off balance in order to demo improved functional mobility.    Baseline can ambulate scanning environment with supervision, does report feeling off balance at times with head turns.    Time 6    Period Weeks    Status Partially Met      PT LONG TERM GOAL #5   Title Pt will improve 5x sit <> stand time to at least 25 seconds or less with BUE support and RPE less than 4/10 in order to demo improved functional BLE strength.    Baseline 34.75 seconds; 13.88 seconds with no UE support on 06/09/20    Time 6    Period Weeks    Status Achieved          LTGs for re-cert:   PT Long Term Goals - 06/25/20 1054      PT LONG TERM GOAL #1   Title Pt will undergo assessment of SOT with LTG written as appropriate. ALL LTGS DUE 07/23/20    Baseline not yet assesesd.    Time 4     Period Weeks    Status New    Target Date 07/23/20      PT LONG TERM GOAL #2   Title Pt will improve DHI to a 16 or less in order to demo less dizziness with functional activities.    Baseline 26 = mild handicap    Time 4    Period Weeks    Status New      PT LONG TERM GOAL #3   Title Pt will improve FOTO to at least a 75 in order to demo improved functional outcomes.    Baseline 66    Time 4    Period Weeks    Status New      PT LONG TERM GOAL #4   Title Pt will report 1/10 or less dizziness/nausea while scrolling on phone in order to demo improved visual motion sensivity.    Baseline 4/10 on 06/25/20    Time 4  Period Weeks    Status New      PT LONG TERM GOAL #5   Title --    Baseline --    Time --    Period --    Status --                 Plan - 06/25/20 1031    Clinical Impression Statement Today's skilled session focused on assessing LTGs for re-cert. Pt able to ambulate while scanning environment with no LOB, but does reports feeling more unsteady and mild sx with head motions. Pt's FGA score today was a 27/30 (not quite to goal level), pt at a low risk of falls, had most difficulty with tandem gait and gait with eyes closed. Had pt fill out the [facility] today, with pt scoring a 26, indicating a mild handicap. Pt with 4-5/10 dizziness today initially with gait with head and eye motions while tracking ball, but does improve with incr practice. Will re-cert for an additional 1x for 2 weeks and 2x week for 2 weeks to continue to work on vestibular and oculomotor deficits, balance, and functional strength/endurance in order to improve functional mobility and independence. LTGs revised and updated as appropriate.    Personal Factors and Comorbidities Comorbidity 3+;Profession;Past/Current Experience    Comorbidities PVD, obesity, HTN, DVT, DM    Examination-Activity Limitations Squat;Transfers;Stairs;Locomotion Level;Stand;Sit    Examination-Participation Restrictions  Community Activity;Occupation;Cleaning    Stability/Clinical Decision Making Evolving/Moderate complexity    Rehab Potential Good    PT Frequency 1x / week   1x week for 2 weeks, followed by 2x week for 2 weeks   PT Duration 2 weeks   1x week for 2 weeks, followed by 2x week for 2 weeks   PT Treatment/Interventions ADLs/Self Care Home Management;Canalith Repostioning;Gait training;Stair training;Therapeutic activities;Functional mobility training;Therapeutic exercise;Balance training;Neuromuscular re-education;Patient/family education;Vasopneumatic Device;Vestibular    PT Next Visit Plan perform SOT and write goal. continue balance on compliant surfaces for incr vestibular input, visual tracking and oculomotor activities.    PT Home Exercise Plan other access code: [facility]         Access Code: [facility]  URL: [email or web address]  Date: 05/19/2020  Prepared by: Markus Jarvis    Exercises  Sit to Stand with Arms Crossed - 1 x daily - 7 x weekly - 2 sets - 10 reps  Standing with half turns-Quick - 1 x daily - 7 x weekly - 1 sets - 3-5 reps    Consulted and Agree with Plan of Care Patient           Patient will benefit from skilled therapeutic intervention in order to improve the following deficits and impairments:  Abnormal gait,Decreased balance,Decreased activity tolerance,Decreased endurance,Decreased strength,Difficulty walking,Dizziness,Obesity  Visit Diagnosis: Dizziness and giddiness  Unsteadiness on feet  Other abnormalities of gait and mobility  Muscle weakness (generalized)     Problem List Patient Active Problem List   Diagnosis Date Noted  . Diplopia   . Ophthalmoplegia of right eye   . CVA (cerebral vascular accident) ([place]) 03/24/2020  . Acute CVA (cerebrovascular accident) ([place]) 03/24/2020  . Hyperlipidemia 12/31/2016  . PVD (peripheral vascular disease) ([place]) 01/29/2016  . Diverticulitis 01/28/2016  . Special screening for malignant neoplasms, colon    . Type 2 diabetes mellitus with diabetic dermatitis, without long-term current use of insulin ([place]) 05/03/2015  . History of deep vein thrombosis (DVT) of lower extremity 03/07/2013  . Ischemia of extremity 02/25/2013  . HTN (hypertension) 05/07/2011  . Erectile dysfunction 05/07/2011  .  Obesity 05/07/2011    Arliss Journey, PT, DPT  06/25/2020, 10:32 AM  [facility] [address] [place], [place], [address] Phone: [phone]   Fax:  [phone]  Name: Carlos Phillips MRN: [id number] Date of Birth: 11/26/1965

## 2020-06-26 NOTE — Procedures (Signed)
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 The patient endorsed the Epworth Sleepiness Scale at 8 points. The patient's weight 378 pounds with a height of 72 (inches), resulting in a BMI of 51.1 kg/m2. The patient's neck circumference measured 18.1 inches.  CURRENT MEDICATIONS: Norvasc, Lipitor, Plavix, Hydrodiuril, Multivitamins, Zofran, Viagra, Januvia, Kenalog   PROCEDURE:  This is a multichannel digital polysomnogram utilizing the Somnostar 11.2 system.  Electrodes and sensors were applied and monitored per AASM Specifications.   EEG, EOG, Chin and Limb EMG, were sampled at 200 Hz.  ECG, Snore and Nasal Pressure, Thermal Airflow, Respiratory Effort, CPAP Flow and Pressure, Oximetry was sampled at 50 Hz. Digital video and audio were recorded.      BASELINE STUDY  Lights Out was at 22:02 and Lights On at 05:00.  Total recording time (TRT) was 418 minutes, with a total sleep time (TST) of 294 minutes.   The patient's sleep latency to persistent sleep was 64 minutes. REM latency was 130.5 minutes.  The sleep efficiency was 70.3 %.     SLEEP ARCHITECTURE: WASO (Wake after sleep onset) was 104.5 minutes with mild to moderate sleep fragmentation noted. There were 29 minutes in Stage N1, 209.5 minutes Stage N2, 0 minutes Stage N3 and 55.5 minutes in Stage REM.  The percentage of Stage N1 was 9.9%, which is increased, Stage N2 was 71.3%, which is markedly increased, Stage N3 was absent, and Stage R (REM sleep) was 18.9%, which is near-normal. The arousals were noted as: 38 were  spontaneous, 0 were associated with PLMs, 75 were associated with respiratory events.  RESPIRATORY ANALYSIS:  There were a total of 190 respiratory events:  78 obstructive apneas, 2 central apneas and 2 mixed apneas with a total of 82 apneas and an apnea index (AI) of 16.7 /hour. There were 108 hypopneas with a hypopnea index of 22. /hour. The patient also had 0 respiratory event related arousals (RERAs).      The total APNEA/HYPOPNEA INDEX (AHI) was 38.8/hour and the total RESPIRATORY DISTURBANCE INDEX was  38.8 /hour.  29 events occurred in REM sleep and 206 events in NREM. The REM AHI was  31.4 /hour, versus a non-REM AHI of 40.5. The patient spent 5 minutes of total sleep time in the supine position and 289 minutes in non-supine.. The supine AHI was 96.0 versus a non-supine AHI of 37.8.  OXYGEN SATURATION & C02:  The Wake baseline 02 saturation was 97%, with the lowest being 83%. Time spent below 89% saturation equaled 51 minutes. PERIODIC LIMB MOVEMENTS: The patient had a total of 0 Periodic Limb Movements.  The Periodic Limb Movement (PLM) index was 0 and the PLM Arousal index was 0/hour.  Audio and video analysis did not show any abnormal or unusual movements, behaviors, phonations or vocalizations. The patient took 1 bathroom break. Mild to moderate snoring was noted. The EKG was in keeping with normal sinus rhythm (NSR).  Post-study, the patient indicated that sleep was the same as usual.   IMPRESSION:  1. Obstructive Sleep Apnea (OSA) 2. Dysfunctions associated  with sleep stages or arousal from sleep  RECOMMENDATIONS:  1. This study demonstrates severe obstructive sleep apnea, with a total AHI of 38.8/hour, REM AHI of 31.4/hour, and O2 nadir of 83%. The absence of supine sleep may have underestimated his AHI and O2 nadir. Treatment with positive airway pressure in the form of CPAP is recommended. This will require a full night titration study to optimize therapy. Other treatment options  may be limited secondary to the severity of his sleep apnea, but, generally speaking, may include avoidance of supine sleep position along with weight loss, upper airway or jaw surgery in selected patients or the use of an oral appliance in certain patients. ENT evaluation and/or consultation with a maxillofacial surgeon or dentist may be feasible in some instances.    2. Please note that untreated obstructive sleep apnea may carry additional perioperative morbidity. Patients with significant obstructive sleep apnea should receive perioperative PAP therapy and the surgeons and particularly the anesthesiologist should be informed of the diagnosis and the severity of the sleep disordered breathing. 3. This study shows sleep fragmentation and abnormal sleep stage percentages; these are nonspecific findings and per se do not signify an intrinsic sleep disorder or a cause for the patient's sleep-related symptoms. Causes include (but are not limited to) the first night effect of the sleep study, circadian rhythm disturbances, medication effect or an underlying mood disorder or medical problem.  4. The patient should be cautioned not to drive, work at heights, or operate dangerous or heavy equipment when tired or sleepy. Review and reiteration of good sleep hygiene measures should be pursued with any patient. 5. The patient will be seen in follow-up in the sleep clinic at [facility] for discussion of the test results, symptom and treatment compliance review, further management strategies, etc. The referring provider will be notified of the test results.  I certify that I have reviewed the entire raw data recording prior to the issuance of this report in accordance with the Standards of Accreditation of the American Academy of Sleep Medicine (AASM)   Star Age, MD, PhD Diplomat, American Board of Neurology and Sleep Medicine (Neurology and Sleep Medicine)

## 2020-06-26 NOTE — Addendum Note (Signed)
Addended by: Star Age on: 06/26/2020 01:00 PM   Modules accepted: Orders

## 2020-06-26 NOTE — Progress Notes (Signed)
Patient referred by Dr. Carlota Raspberry, seen by me on 05/05/20, diagnostic PSG on 06/11/20.   Please call and notify the patient that the recent sleep study showed severe obstructive sleep apnea. I recommend treatment for this in the form of CPAP. This will require a repeat sleep study for proper titration and mask fitting and correct monitoring of the oxygen saturations. Please explain to patient. I have placed an order in the chart. Thanks.  Star Age, MD, PhD [facility])

## 2020-06-29 ENCOUNTER — Telehealth: Payer: Self-pay

## 2020-06-29 NOTE — Telephone Encounter (Signed)
I called pt and left a vm ( ok per dpr). I advised pt that Dr. Rexene Alberts reviewed their sleep study results and found that has severe OSA and recommends that pt be treated with a cpap. Dr. Rexene Alberts recommends that pt return for a repeat sleep study in order to properly titrate the cpap and ensure a good mask fit. I advised pt that our sleep lab will file with pt's insurance and call pt to schedule the sleep study when we hear back from the pt's insurance regarding coverage of this sleep study. Pt advised to CB if he had any questions.

## 2020-06-29 NOTE — Telephone Encounter (Signed)
-----   Message from Star Age, MD sent at 06/26/2020 12:59 PM EDT ----- Patient referred by Dr. Carlota Raspberry, seen by me on 05/05/20, diagnostic PSG on 06/11/20.   Please call and notify the patient that the recent sleep study showed severe obstructive sleep apnea. I recommend treatment for this in the form of CPAP. This will require a repeat sleep study for proper titration and mask fitting and correct monitoring of the oxygen saturations. Please explain to patient. I have placed an order in the chart. Thanks.  Star Age, MD, PhD [facility])

## 2020-06-30 ENCOUNTER — Ambulatory Visit: Payer: BC Managed Care – PPO | Admitting: Physical Therapy

## 2020-06-30 ENCOUNTER — Telehealth: Payer: Self-pay

## 2020-06-30 ENCOUNTER — Ambulatory Visit: Payer: BC Managed Care – PPO | Admitting: Occupational Therapy

## 2020-06-30 NOTE — Telephone Encounter (Signed)
Calling to schedule titration.

## 2020-07-02 ENCOUNTER — Ambulatory Visit: Payer: Self-pay | Admitting: Family Medicine

## 2020-07-07 ENCOUNTER — Ambulatory Visit: Payer: BC Managed Care – PPO | Attending: Family Medicine | Admitting: Occupational Therapy

## 2020-07-07 ENCOUNTER — Ambulatory Visit: Payer: BC Managed Care – PPO

## 2020-07-07 ENCOUNTER — Other Ambulatory Visit: Payer: Self-pay

## 2020-07-07 DIAGNOSIS — R2689 Other abnormalities of gait and mobility: Secondary | ICD-10-CM | POA: Diagnosis not present

## 2020-07-07 DIAGNOSIS — R2681 Unsteadiness on feet: Secondary | ICD-10-CM | POA: Insufficient documentation

## 2020-07-07 DIAGNOSIS — R42 Dizziness and giddiness: Secondary | ICD-10-CM | POA: Insufficient documentation

## 2020-07-07 DIAGNOSIS — R278 Other lack of coordination: Secondary | ICD-10-CM | POA: Diagnosis not present

## 2020-07-07 DIAGNOSIS — M6281 Muscle weakness (generalized): Secondary | ICD-10-CM

## 2020-07-07 NOTE — Therapy (Signed)
[facility] [address] [place], [place], [address] Phone: [phone]   Fax:  [phone]  Occupational Therapy Treatment  Patient Details  Name: Carlos Phillips MRN: [id number] Date of Birth: 1965/11/28 Referring Provider (OT): Dr. [name] - PCP   Encounter Date: 07/07/2020   OT End of Session - 07/07/20 1011    Visit Number 3    Number of Visits 6    Date for OT Re-Evaluation 06/24/20    Authorization Type BC/BS - [employer], VL: 30 combined    OT Start Time 0800    OT Stop Time 0848    OT Time Calculation (min) 48 min    Activity Tolerance Patient tolerated treatment well    Behavior During Therapy [facility] for tasks assessed/performed           Past Medical History:  Diagnosis Date  . Diabetes mellitus without complication ([place])   . DVT (deep venous thrombosis) ([place]) 02/2013  . Erectile dysfunction   . Hyperlipidemia   . Hypertension   . Obesity    BMI >52  . PVD (peripheral vascular disease) ([place])     Past Surgical History:  Procedure Laterality Date  . ABDOMINAL AORTAGRAM N/A 02/28/2013   Procedure: ABDOMINAL Maxcine Ham;  Surgeon: Conrad Hall, MD;  Location: [facility] CATH LAB;  Service: Cardiovascular;  Laterality: N/A;  . COLONOSCOPY WITH PROPOFOL N/A 12/25/2015   Procedure: COLONOSCOPY WITH PROPOFOL;  Surgeon: Manus Gunning, MD;  Location: [facility] ENDOSCOPY;  Service: Gastroenterology;  Laterality: N/A;  . EMBOLECTOMY Left 02/25/2013   Procedure: Left Popliteal EMBOLECTOMY Poss fasciotomy;  Surgeon: Elam Dutch, MD;  Location: [facility] OR;  Service: Vascular;  Laterality: Left;  Left poplital and Tibial embolectomy with four compartment Fasciotomy with vein patch angioplasty left popliteal artery.    There were no vitals filed for this visit.   Subjective Assessment - 07/07/20 0806    Subjective  All I have trouble with now is balance with my eyes closes and some dizziness    Pertinent History MRI  of brain showed small acute infarct in the midbrain in the expected region of the Rt oculomotor nucleus as well as a small remote lacunar infarct in the Rt thalamus 03/24/20.  PMH includes:  PVD, obesity, HTN, DVT, DM    Limitations no driving    Currently in Pain? No/denies                        OT Treatments/Exercises (OP) - 07/07/20 0001      ADLs   ADL Comments Pt reports doing yardwork for 30-45 minutes, rests, then returns to another 30-45 minutes. Reviewed goals and progress to date - Pt has met all but 1 LTG. Pt issued info on driving eval and voc rehab services if pt wants to return to work tasks in future.      Visual/Perceptual Exercises   Scanning Environmental    Scanning - Environmental Pt found 12/14 items (86% accuracy) on first pass and found remaining 2 on 2nd pass. Recommended pt look for items in grocery store, unfamiliar stores with wife. Also recommended checking to see when safe to merge, change lanes, and go at intersections when wife is driving in prep for driving      Functional Reaching Activities   High Level standing to place small pegs in pegboard higher surface following design for standing tolerance, endurance, coordination and functional reach RUE, as well as visual/perceptual skills -  pt stood for > 15 min and copied design 95% correctly                       OT Long Term Goals - 07/07/20 1012      OT LONG TERM GOAL #1   Title Independent with high level coordination HEP    Time 6    Period Weeks    Status Achieved      OT LONG TERM GOAL #2   Title Pt to perform environmental scanning with physical tasks at 88% accuracy or greater in prep for return to driving/work tasks    Time 6    Period Weeks    Status Partially Met   06/11/20: 75%, 07/07/20: 86% accuracy     OT LONG TERM GOAL #3   Title Pt to stand for 15 min or > w/o rest to complete IADL tasks safely    Time 6    Period Weeks    Status Achieved      OT LONG  TERM GOAL #4   Title Pt to verbalize understanding with possible visual adaptive devices and modifications to help reduce glare    Time 6    Period Weeks    Status Deferred   no longer needed                Plan - 07/07/20 1014    Clinical Impression Statement Pt has met all but 1 LTG however has improved towards this goal. Recommended things to encouraged environmental scanning in prep for return to driving    OT Occupational Profile and History Problem Focused Assessment - Including review of records relating to presenting problem    Occupational performance deficits (Please refer to evaluation for details): IADL's;Work;Leisure    Body Structure / Function / Physical Skills Endurance;IADL;Vision;Coordination;FMC;Decreased knowledge of use of DME;Body mechanics    Rehab Potential Good    Clinical Decision Making Limited treatment options, no task modification necessary    Comorbidities Affecting Occupational Performance: May have comorbidities impacting occupational performance    Modification or Assistance to Complete Evaluation  No modification of tasks or assist necessary to complete eval    OT Frequency 1x / week    OT Duration 6 weeks    OT Treatment/Interventions Self-care/ADL training;Therapeutic activities;Therapeutic exercise;Neuromuscular education;Functional Mobility Training;Visual/perceptual remediation/compensation;Patient/family education    Plan D/C O.T.    Consulted and Agree with Plan of Care Patient           Patient will benefit from skilled therapeutic intervention in order to improve the following deficits and impairments:   Body Structure / Function / Physical Skills: Endurance,IADL,Vision,Coordination,FMC,Decreased knowledge of use of DME,Body mechanics       Visit Diagnosis: Unsteadiness on feet  Other lack of coordination  Muscle weakness (generalized)    Problem List Patient Active Problem List   Diagnosis Date Noted  . Diplopia   .  Ophthalmoplegia of right eye   . CVA (cerebral vascular accident) ([place]) 03/24/2020  . Acute CVA (cerebrovascular accident) ([place]) 03/24/2020  . Hyperlipidemia 12/31/2016  . PVD (peripheral vascular disease) ([place]) 01/29/2016  . Diverticulitis 01/28/2016  . Special screening for malignant neoplasms, colon   . Type 2 diabetes mellitus with diabetic dermatitis, without long-term current use of insulin ([place]) 05/03/2015  . History of deep vein thrombosis (DVT) of lower extremity 03/07/2013  . Ischemia of extremity 02/25/2013  . HTN (hypertension) 05/07/2011  . Erectile dysfunction 05/07/2011  . Obesity 05/07/2011  OCCUPATIONAL THERAPY DISCHARGE SUMMARY  Visits from Start of Care: 3  Current functional level related to goals / functional outcomes: See above   Remaining deficits: Balance Mild visual scanning   Education / Equipment: HEP, driving eval and voc rehab info  Plan: Patient agrees to discharge.  Patient goals were met. Patient is being discharged due to meeting the stated rehab goals.  ?????        Carey Bullocks, OTR/L 07/07/2020, 10:50 AM  [facility] [address] [place], [place], [address] Phone: [phone]   Fax:  [phone]  Name: MACALLAN ORD MRN: [id number] Date of Birth: 1966/02/26

## 2020-07-07 NOTE — Therapy (Signed)
[facility] [address] [place], [place], [address] Phone: [phone]   Fax:  [phone]  Physical Therapy Treatment  Patient Details  Name: Carlos Phillips MRN: [id number] Date of Birth: [birth year] Referring Provider (PT): Frann Rider   Encounter Date: 07/07/2020   PT End of Session - 07/07/20 0854    Visit Number 10    Number of Visits 15    Authorization Type BCBS - VL: 30 PT/OT/ST combined - each visit of each type counts    Authorization - Visit Number 10    Authorization - Number of Visits 30    PT Start Time 0850    PT Stop Time 0930    PT Time Calculation (min) 40 min    Equipment Utilized During Treatment Gait belt    Activity Tolerance Patient tolerated treatment well    Behavior During Therapy WFL for tasks assessed/performed           Past Medical History:  Diagnosis Date  . Diabetes mellitus without complication ([place])   . DVT (deep venous thrombosis) ([place]) 02/2013  . Erectile dysfunction   . Hyperlipidemia   . Hypertension   . Obesity    BMI >52  . PVD (peripheral vascular disease) ([place])     Past Surgical History:  Procedure Laterality Date  . ABDOMINAL AORTAGRAM N/A 02/28/2013   Procedure: ABDOMINAL Maxcine Ham;  Surgeon: Conrad Portage, MD;  Location: [facility] CATH LAB;  Service: Cardiovascular;  Laterality: N/A;  . COLONOSCOPY WITH PROPOFOL N/A 12/25/2015   Procedure: COLONOSCOPY WITH PROPOFOL;  Surgeon: Manus Gunning, MD;  Location: [facility];  Service: Gastroenterology;  Laterality: N/A;  . EMBOLECTOMY Left 02/25/2013   Procedure: Left Popliteal EMBOLECTOMY Poss fasciotomy;  Surgeon: Elam Dutch, MD;  Location: [facility] OR;  Service: Vascular;  Laterality: Left;  Left poplital and Tibial embolectomy with four compartment Fasciotomy with vein patch angioplasty left popliteal artery.    There were no vitals filed for this visit.   Subjective Assessment - 07/07/20 0855     Subjective Pt reports he still gets dizzy intermittently. COming down stairs is little difficult as he has to hold onto rail. When he is standing/walking for a whie, he gets nauseated and he has to wait for 1-2 min before he can go again. He can work for 5-10 min before nausea kick in and then he can go again.    Pertinent History PVD, obesity, HTN, DVT, DM    Limitations Walking    How long can you stand comfortably? 10-15 minutes after standing up in the kitchen    Diagnostic tests MRI of brain showed small acute infarct in the midbrain in the expected region of the Rt oculomotor nucleus as well as a small remote lacunar infarct in the Rt thalamus.    Patient Stated Goals wants to improve the balance               Today's treatment Sit to stand: 20lbs KB: 2 x 10 Standing deadlift: 25lbs 5x, pt started to get nauseated and little dizzy. He sat down. Took vitals. Performed 10 more but cued to breath in as he goes down and breath out as he comes up (to prevent Valsalva) and felt little less dizzy compared to before, so sat down again until it relaxed Vitals: BP 131/90, 85bpm Throwing ball to wall letting in bounce and then catching it: 50x- this required up and down head movement- pt reported no dizziness. Standing: throwing the  ball to the ceiling and catching it: pt to track the ball with head/eyes: 50x - no dizziness reported, pt asked to hold breath 2x for 5 reps, pt reported no dizziness or nausea  Gait training: 1 x 1050' no AD, SBA, pt reported his back and gluts were tired and sore towards the end of the walk  Pt education: pt's walking endurance is limited to 4'. Pt educated on working on walking program. Carin Primrose adding 2' every week to improve cardiovascular endurance.                        PT Short Term Goals - 06/24/20 1952      PT SHORT TERM GOAL #1   Title Pt will be independent with initial HEP in order to build upon functional gains made in therapy.  ALL STGS DUE 06/03/20    Baseline pt reports performing HEP consistently at home.    Time 3    Period Weeks    Status Achieved    Target Date 06/03/20      PT SHORT TERM GOAL #2   Title Pt will undergo further vestibular assessment with LTG to be written as appropriate.    Time 3    Period Weeks    Status New    Target Date 06/03/20      PT SHORT TERM GOAL #3   Title Pt will undergo DGI/FGA with LTG as appropriate to demo improved balance and decr fall risk.    Baseline LTG written (05/19/20)    Time 3    Period Weeks    Status Achieved      PT SHORT TERM GOAL #4   Title Pt will improve 5x sit <> stand time to at least 29 seconds or less with BUE support in order to demo improved functional BLE strength.    Baseline 34.75 seconds, 13.88 seconds with no UE support on 06/09/20    Time 3    Period Weeks    Status Achieved             PT Long Term Goals - 06/25/20 1054      PT LONG TERM GOAL #1   Title Pt will undergo assessment of SOT with LTG written as appropriate. ALL LTGS DUE 07/23/20    Baseline not yet assesesd.    Time 4    Period Weeks    Status New    Target Date 07/23/20      PT LONG TERM GOAL #2   Title Pt will improve DHI to a 16 or less in order to demo less dizziness with functional activities.    Baseline 26 = mild handicap    Time 4    Period Weeks    Status New      PT LONG TERM GOAL #3   Title Pt will improve FOTO to at least a 75 in order to demo improved functional outcomes.    Baseline 66    Time 4    Period Weeks    Status New      PT LONG TERM GOAL #4   Title Pt will report 1/10 or less dizziness/nausea while scrolling on phone in order to demo improved visual motion sensivity.    Baseline 4/10 on 06/25/20    Time 4    Period Weeks    Status New      PT LONG TERM GOAL #5   Title --    Baseline --  Time --    Period --    Status --                 Plan - 07/07/20 0932    Clinical Impression Statement Pt seems to feel  dizzy more with up and down head movements but not consistently. He seems to get more dizzy when up and down head movement are combined with more strenous activities where his cadiovascular endurance is challanged (deadlifts vs ball toss). Pt has limited walking endurance as his RPE was >12 after 4 min of walking.    Personal Factors and Comorbidities Comorbidity 3+;Profession;Past/Current Experience    Comorbidities PVD, obesity, HTN, DVT, DM    Examination-Activity Limitations Squat;Transfers;Stairs;Locomotion Level;Stand;Sit    Examination-Participation Restrictions Community Activity;Occupation;Cleaning    Stability/Clinical Decision Making Evolving/Moderate complexity    Rehab Potential Good    PT Frequency 1x / week   1x week for 2 weeks, followed by 2x week for 2 weeks   PT Duration 2 weeks   1x week for 2 weeks, followed by 2x week for 2 weeks   PT Treatment/Interventions ADLs/Self Care Home Management;Canalith Repostioning;Gait training;Stair training;Therapeutic activities;Functional mobility training;Therapeutic exercise;Balance training;Neuromuscular re-education;Patient/family education;Vasopneumatic Device;Vestibular    PT Next Visit Plan perform SOT and write goal. continue balance on compliant surfaces for incr vestibular input, visual tracking and oculomotor activities.    PT Home Exercise Plan other access code: [facility]         Access Code: [facility]  URL: [email or web address]  Date: 05/19/2020  Prepared by: Markus Jarvis    Exercises  Sit to Stand with Arms Crossed - 1 x daily - 7 x weekly - 2 sets - 10 reps  Standing with half turns-Quick - 1 x daily - 7 x weekly - 1 sets - 3-5 reps    Consulted and Agree with Plan of Care Patient           Patient will benefit from skilled therapeutic intervention in order to improve the following deficits and impairments:  Abnormal gait,Decreased balance,Decreased activity tolerance,Decreased endurance,Decreased  strength,Difficulty walking,Dizziness,Obesity  Visit Diagnosis: Dizziness and giddiness  Unsteadiness on feet  Other abnormalities of gait and mobility  Muscle weakness (generalized)     Problem List Patient Active Problem List   Diagnosis Date Noted  . Diplopia   . Ophthalmoplegia of right eye   . CVA (cerebral vascular accident) ([place]) 03/24/2020  . Acute CVA (cerebrovascular accident) ([place]) 03/24/2020  . Hyperlipidemia 12/31/2016  . PVD (peripheral vascular disease) ([place]) 01/29/2016  . Diverticulitis 01/28/2016  . Special screening for malignant neoplasms, colon   . Type 2 diabetes mellitus with diabetic dermatitis, without long-term current use of insulin ([place]) 05/03/2015  . History of deep vein thrombosis (DVT) of lower extremity 03/07/2013  . Ischemia of extremity 02/25/2013  . HTN (hypertension) 05/07/2011  . Erectile dysfunction 05/07/2011  . Obesity 05/07/2011    Kerrie Pleasure 07/07/2020, 9:35 AM  [facility] [address] [place], [place], [address] Phone: [phone]   Fax:  [phone]  Name: PAITON FOSCO MRN: [id number] Date of Birth: [birth year]

## 2020-07-07 NOTE — Patient Instructions (Signed)
Local Driver Evaluation Programs: ° °Comprehensive Evaluation: includes clinical and in vehicle behind the wheel testing by OCCUPATIONAL THERAPIST. Programs have varying levels of adaptive controls available for trial.  ° °[facility] °[address] °[place], [place]  [address] °[phone] or [phone] °http://www.driver-rehab.com °Evaluator:  Cyndee Crompton, OT/CDRS/CDI/SCDCM/Low Vision Certification ° °[facility] °[address] °[place], [place] [address] °[phone] °https://www.[facility].org/home/services/rehabilitation.aspx °Evaluators:  Shannon Sheek, OT and Jill Tucker, OT ° °[facility] [place] [facility] (ONLY SERVES VETERANS!!) °[facility] °[address] °[place], [place]  [address] °[phone] [phone] °http://www.[place].va.gov/services/Physical_Medicine_Rehabilitation_Services.asp °Evaluators:  Eric Andrews, KT; Heidi Harris, KT;  Gary Whitaker, KT (KT=kiniesotherapist) ° ° °Clinical evaluations only:  Includes clinical testing, refers to other programs or local certified driving instructor for behind the wheel testing. ° °[facility] (outpatient Rehab) °[facility] °[address] °[place], [place] [address] °[phone] for scheduling °http://www.[facility].edu/Outpatient-Rehabilitation/Neurorehabilitation-Therapy.htm °Evaluators:  Gloria Lambertson [place], OT; Kate Phillips, OT ° °Other area clinical evaluators available upon request including [facility], [facility] and [facility]. ° ° °    Resource List °What is a Driver Evaluation: °Your Road Ahead - A Guide to Comprehensive Driving Evaluations °http://www.thehartford.com/resources/mature-market-excellence/publications-on-aging ° °Association for [employer] - Disability and Driving Fact Sheets °http://www.aded.net/?page=510 ° °Driving after a Brain  Injury: °[employer] °http://www.biausa.org/tbims-abstracts/if-there-is-an-effective-way-to-determine-if-someone-is-ready-to-drive-after-tbi?A=SearchResult&SearchID=9495675&ObjectID=2758842&ObjectType=35 ° °Driving with Adaptive Equipment: °[facility] Process °http://www.driver-rehab.com/adaptive-equipment ° °[employer] °http://www.nmeda.com/ ° ° ° ° ° ° °  °

## 2020-07-09 ENCOUNTER — Telehealth: Payer: Self-pay

## 2020-07-09 NOTE — Telephone Encounter (Signed)
Opened in error

## 2020-07-09 NOTE — Telephone Encounter (Signed)
LVM for pt to call me back to schedule sleep study  

## 2020-07-14 ENCOUNTER — Other Ambulatory Visit: Payer: Self-pay

## 2020-07-14 ENCOUNTER — Encounter: Payer: Self-pay | Admitting: Physical Therapy

## 2020-07-14 ENCOUNTER — Ambulatory Visit: Payer: BC Managed Care – PPO | Admitting: Physical Therapy

## 2020-07-14 ENCOUNTER — Telehealth: Payer: Self-pay

## 2020-07-14 DIAGNOSIS — R2681 Unsteadiness on feet: Secondary | ICD-10-CM | POA: Diagnosis not present

## 2020-07-14 DIAGNOSIS — R42 Dizziness and giddiness: Secondary | ICD-10-CM

## 2020-07-14 DIAGNOSIS — R2689 Other abnormalities of gait and mobility: Secondary | ICD-10-CM | POA: Diagnosis not present

## 2020-07-14 DIAGNOSIS — M6281 Muscle weakness (generalized): Secondary | ICD-10-CM | POA: Diagnosis not present

## 2020-07-14 DIAGNOSIS — R278 Other lack of coordination: Secondary | ICD-10-CM | POA: Diagnosis not present

## 2020-07-14 NOTE — Telephone Encounter (Signed)
We have attempted to call the patient two times to schedule sleep study.  Patient has been unavailable at the phone numbers we have on file and has not returned our calls. If patient calls back we will schedule them for their sleep study.  

## 2020-07-15 NOTE — Therapy (Signed)
[facility] [address] [place], [place], [address] Phone: [phone]   Fax:  [phone]  Physical Therapy Treatment  Patient Details  Name: Carlos Phillips MRN: [id number] Date of Birth: January 04, 1966 Referring Provider (PT): Frann Rider   Encounter Date: 07/14/2020   PT End of Session - 07/15/20 0731    Visit Number 11    Number of Visits 15    Authorization Type BCBS - VL: 30 PT/OT/ST combined - each visit of each type counts    Authorization - Visit Number 11    Authorization - Number of Visits 30    PT Start Time 0845    PT Stop Time 0930    PT Time Calculation (min) 45 min    Equipment Utilized During Treatment Other (comment)   vest harness with SOT   Activity Tolerance Patient tolerated treatment well    Behavior During Therapy [facility] for tasks assessed/performed           Past Medical History:  Diagnosis Date  . Diabetes mellitus without complication ([place])   . DVT (deep venous thrombosis) ([place]) 02/2013  . Erectile dysfunction   . Hyperlipidemia   . Hypertension   . Obesity    BMI >52  . PVD (peripheral vascular disease) ([place])     Past Surgical History:  Procedure Laterality Date  . ABDOMINAL AORTAGRAM N/A 02/28/2013   Procedure: ABDOMINAL Maxcine Ham;  Surgeon: Conrad Newcomb, MD;  Location: [facility] CATH LAB;  Service: Cardiovascular;  Laterality: N/A;  . COLONOSCOPY WITH PROPOFOL N/A 12/25/2015   Procedure: COLONOSCOPY WITH PROPOFOL;  Surgeon: Manus Gunning, MD;  Location: [facility] ENDOSCOPY;  Service: Gastroenterology;  Laterality: N/A;  . EMBOLECTOMY Left 02/25/2013   Procedure: Left Popliteal EMBOLECTOMY Poss fasciotomy;  Surgeon: Elam Dutch, MD;  Location: [facility] OR;  Service: Vascular;  Laterality: Left;  Left poplital and Tibial embolectomy with four compartment Fasciotomy with vein patch angioplasty left popliteal artery.    There were no vitals filed for this visit.   Subjective Assessment  - 07/14/20 0940    Subjective Pt reports he is doing better - continues to do exercises at home; has started walking 2"-3" several times a day    Pertinent History PVD, obesity, HTN, DVT, DM    Limitations Walking    How long can you stand comfortably? 10-15 minutes after standing up in the kitchen    Diagnostic tests MRI of brain showed small acute infarct in the midbrain in the expected region of the Rt oculomotor nucleus as well as a small remote lacunar infarct in the Rt thalamus.    Patient Stated Goals wants to improve the balance    Currently in Pain? No/denies             Sensory Organization Test - composite score 74/100;  N= 70/100 SomatosensoryWNL's Visual WNL's Vestibular WNL's  Condition 1 = trials 1 & 2 WNL's; trial 3 slightly decreased Condition 2 = all 3 trials WNL's Condition 3 = trial 1 WNL's; trials 2 & 3 slightly below N Condition 4 = all 3 trials WNL's Condition 5 = trials 1 & 3 WNL's:  Trial 2 slightly below N Condition 6 = trials 1 & 3 WNL's:  Trial 2 slightly below N  Pt uses hip strategy with no ankle strategy utilized during test  COG is centered    Neuro Re-ed:  Pt performed marching on incline & decline - with EO and EC with no head turns Progressed  to horizontal and vertical head turns with EO and then with EC - approx. 5 reps each  Pt had some mild LOB with marching on decline with vertical head turns  Pt performed amb. Approx. 20' with abrupt turn and stop - 4 reps; no dizziness reported with this activity  Performed amb. 115' with intermittent horizontal head turns - no LOB and no c/o dizziness with this activity            [facility] Adult PT Treatment/Exercise - 07/15/20 0001      Neuro Re-ed    Neuro Re-ed Details  Pt performed amb. with ball - making circles clockwise, counterclockwise, "v", "t" and "s" patterns for improved VOR and gaze stabilization - approx. 78' making each of the patterns                    PT Short  Term Goals - 07/15/20 0736      PT SHORT TERM GOAL #1   Title Pt will be independent with initial HEP in order to build upon functional gains made in therapy. ALL STGS DUE 06/03/20    Baseline pt reports performing HEP consistently at home.    Time 3    Period Weeks    Status Achieved    Target Date 06/03/20      PT SHORT TERM GOAL #2   Title Pt will undergo further vestibular assessment with LTG to be written as appropriate.    Time 3    Period Weeks    Status New    Target Date 06/03/20      PT SHORT TERM GOAL #3   Title Pt will undergo DGI/FGA with LTG as appropriate to demo improved balance and decr fall risk.    Baseline LTG written (05/19/20)    Time 3    Period Weeks    Status Achieved      PT SHORT TERM GOAL #4   Title Pt will improve 5x sit <> stand time to at least 29 seconds or less with BUE support in order to demo improved functional BLE strength.    Baseline 34.75 seconds, 13.88 seconds with no UE support on 06/09/20    Time 3    Period Weeks    Status Achieved             PT Long Term Goals - 07/15/20 0736      PT LONG TERM GOAL #1   Title Pt will undergo assessment of SOT with LTG written as appropriate. ALL LTGS DUE 07/23/20    Baseline Test peformed on 07-14-20 - score is WNL's - no LTG needed (SD)    Time 4    Period Weeks    Status Achieved      PT LONG TERM GOAL #2   Title Pt will improve DHI to a 16 or less in order to demo less dizziness with functional activities.    Baseline 26 = mild handicap    Time 4    Period Weeks    Status New      PT LONG TERM GOAL #3   Title Pt will improve FOTO to at least a 75 in order to demo improved functional outcomes.    Baseline 66    Time 4    Period Weeks    Status New      PT LONG TERM GOAL #4   Title Pt will report 1/10 or less dizziness/nausea while scrolling on phone in order to demo  improved visual motion sensivity.    Baseline 4/10 on 06/25/20    Time 4    Period Weeks    Status New                  Plan - 07/14/20 0855    Clinical Impression Statement Pt demonstrating much improvement with VOR and gaze stabilization as pt able to perform amb. with visual tracking of ball with no significant increased c/o dizziness.  No nausea reported at all today with any vestibular activities.  Pt's SOT composite score is WNL's; pt's somatosensory, visual and vestibular inputs are all WNL's.  Cont with POC.    Personal Factors and Comorbidities Comorbidity 3+;Profession;Past/Current Experience    Comorbidities PVD, obesity, HTN, DVT, DM    Examination-Activity Limitations Squat;Transfers;Stairs;Locomotion Level;Stand;Sit    Examination-Participation Restrictions Community Activity;Occupation;Cleaning    Stability/Clinical Decision Making Evolving/Moderate complexity    Rehab Potential Good    PT Frequency 1x / week   1x week for 2 weeks, followed by 2x week for 2 weeks   PT Duration 2 weeks   1x week for 2 weeks, followed by 2x week for 2 weeks   PT Treatment/Interventions ADLs/Self Care Home Management;Canalith Repostioning;Gait training;Stair training;Therapeutic activities;Functional mobility training;Therapeutic exercise;Balance training;Neuromuscular re-education;Patient/family education;Vasopneumatic Device;Vestibular    PT Next Visit Plan perform SOT and write goal. continue balance on compliant surfaces for incr vestibular input, visual tracking and oculomotor activities.    PT Home Exercise Plan other access code: [facility]         Access Code: [facility]  URL: [email or web address]  Date: 05/19/2020  Prepared by: Markus Jarvis    Exercises  Sit to Stand with Arms Crossed - 1 x daily - 7 x weekly - 2 sets - 10 reps  Standing with half turns-Quick - 1 x daily - 7 x weekly - 1 sets - 3-5 reps    Consulted and Agree with Plan of Care Patient           Patient will benefit from skilled therapeutic intervention in order to improve the following deficits and impairments:   Abnormal gait,Decreased balance,Decreased activity tolerance,Decreased endurance,Decreased strength,Difficulty walking,Dizziness,Obesity  Visit Diagnosis: Dizziness and giddiness  Unsteadiness on feet     Problem List Patient Active Problem List   Diagnosis Date Noted  . Diplopia   . Ophthalmoplegia of right eye   . CVA (cerebral vascular accident) ([place]) 03/24/2020  . Acute CVA (cerebrovascular accident) ([place]) 03/24/2020  . Hyperlipidemia 12/31/2016  . PVD (peripheral vascular disease) ([place]) 01/29/2016  . Diverticulitis 01/28/2016  . Special screening for malignant neoplasms, colon   . Type 2 diabetes mellitus with diabetic dermatitis, without long-term current use of insulin ([place]) 05/03/2015  . History of deep vein thrombosis (DVT) of lower extremity 03/07/2013  . Ischemia of extremity 02/25/2013  . HTN (hypertension) 05/07/2011  . Erectile dysfunction 05/07/2011  . Obesity 05/07/2011    Alda Lea, PT 07/15/2020, 7:38 AM  [facility] [address] [place], [place], [address] Phone: [phone]   Fax:  [phone]  Name: Carlos Phillips MRN: [id number] Date of Birth: 1965/06/06

## 2020-07-16 ENCOUNTER — Other Ambulatory Visit: Payer: Self-pay

## 2020-07-16 ENCOUNTER — Ambulatory Visit: Payer: BC Managed Care – PPO | Admitting: Physical Therapy

## 2020-07-16 DIAGNOSIS — R42 Dizziness and giddiness: Secondary | ICD-10-CM

## 2020-07-16 DIAGNOSIS — R2689 Other abnormalities of gait and mobility: Secondary | ICD-10-CM | POA: Diagnosis not present

## 2020-07-16 DIAGNOSIS — R278 Other lack of coordination: Secondary | ICD-10-CM | POA: Diagnosis not present

## 2020-07-16 DIAGNOSIS — R2681 Unsteadiness on feet: Secondary | ICD-10-CM

## 2020-07-16 DIAGNOSIS — M6281 Muscle weakness (generalized): Secondary | ICD-10-CM | POA: Diagnosis not present

## 2020-07-17 NOTE — Therapy (Signed)
[facility] [address] [place], [place], [address] Phone: [phone]   Fax:  [phone]  Physical Therapy Treatment  Patient Details  Name: Carlos Phillips MRN: [id number] Date of Birth: 26-Jan-1966 Referring Provider (PT): Frann Rider   Encounter Date: 07/16/2020   PT End of Session - 07/17/20 0950    Visit Number 12    Number of Visits 15    Authorization Type BCBS - VL: 30 PT/OT/ST combined - each visit of each type counts    Authorization - Visit Number 12    Authorization - Number of Visits 30    PT Start Time 0850    PT Stop Time 0930    PT Time Calculation (min) 40 min    Equipment Utilized During Treatment --   vest harness with SOT   Activity Tolerance Patient tolerated treatment well    Behavior During Therapy [facility] for tasks assessed/performed           Past Medical History:  Diagnosis Date  . Diabetes mellitus without complication ([place])   . DVT (deep venous thrombosis) ([place]) 02/2013  . Erectile dysfunction   . Hyperlipidemia   . Hypertension   . Obesity    BMI >52  . PVD (peripheral vascular disease) ([place])     Past Surgical History:  Procedure Laterality Date  . ABDOMINAL AORTAGRAM N/A 02/28/2013   Procedure: ABDOMINAL Maxcine Ham;  Surgeon: Conrad Foster, MD;  Location: [facility] CATH LAB;  Service: Cardiovascular;  Laterality: N/A;  . COLONOSCOPY WITH PROPOFOL N/A 12/25/2015   Procedure: COLONOSCOPY WITH PROPOFOL;  Surgeon: Manus Gunning, MD;  Location: [facility] ENDOSCOPY;  Service: Gastroenterology;  Laterality: N/A;  . EMBOLECTOMY Left 02/25/2013   Procedure: Left Popliteal EMBOLECTOMY Poss fasciotomy;  Surgeon: Elam Dutch, MD;  Location: [facility] OR;  Service: Vascular;  Laterality: Left;  Left poplital and Tibial embolectomy with four compartment Fasciotomy with vein patch angioplasty left popliteal artery.    There were no vitals filed for this visit.   Subjective Assessment - 07/17/20  0947    Subjective Pt reports he walked yesterday for 2" - plans to increase the time to 3" today ; pt reports he is doing much better    Pertinent History PVD, obesity, HTN, DVT, DM    Limitations Walking    How long can you stand comfortably? 10-15 minutes after standing up in the kitchen    Diagnostic tests MRI of brain showed small acute infarct in the midbrain in the expected region of the Rt oculomotor nucleus as well as a small remote lacunar infarct in the Rt thalamus.    Patient Stated Goals wants to improve the balance    Currently in Pain? No/denies                             [facility] Adult PT Treatment/Exercise - 07/17/20 0001      Ambulation/Gait   Ambulation/Gait Yes    Ambulation/Gait Assistance 5: Supervision    Ambulation/Gait Assistance Details with intermittent head turns; abrupt stop and turn; walking backwards 20' tossing ball with SBA    Gait Pattern Within Functional Limits    Ambulation Surface Level;Indoor    Gait Comments approx. 200' total distance      High Level Balance   High Level Balance Activities Backward walking;Direction changes;Turns;Sudden stops;Head turns               Balance Exercises -  07/17/20 0001      Balance Exercises: Standing   Rockerboard Anterior/posterior;EO;EC;10 reps;Intermittent UE support    Turning Right;Left   4 reps to each side   Marching Foam/compliant surface;Head turns;Static;10 reps   EO and EC   Other Standing Exercises standing on 2 pillows in corner - pt performed trunk rotations - side to side and diagonals 5 reps each direction with EO and then with EC with CGA for safety    Other Standing Exercises Comments Marching on incline - on blue mat with EO and with EC; EO with horizontal and vertical head turns 5 reps each direction          Pt performed standing balance activity on blue mat - holding small medicine ball - reaching down toward Lt foot/ returning upright  With turn toward Rt side  -- 180 degree turn on mat - then reaching down to Rt foot/returning upright with turn toward Lt side - Sequence performed 3 reps with pt reporting minimal dizziness with the 180 degree turns     PT Short Term Goals - 07/17/20 0953      PT SHORT TERM GOAL #1   Title Pt will be independent with initial HEP in order to build upon functional gains made in therapy. ALL STGS DUE 06/03/20    Baseline pt reports performing HEP consistently at home.    Time 3    Period Weeks    Status Achieved    Target Date 06/03/20      PT SHORT TERM GOAL #2   Title Pt will undergo further vestibular assessment with LTG to be written as appropriate.    Time 3    Period Weeks    Status New    Target Date 06/03/20      PT SHORT TERM GOAL #3   Title Pt will undergo DGI/FGA with LTG as appropriate to demo improved balance and decr fall risk.    Baseline LTG written (05/19/20)    Time 3    Period Weeks    Status Achieved      PT SHORT TERM GOAL #4   Title Pt will improve 5x sit <> stand time to at least 29 seconds or less with BUE support in order to demo improved functional BLE strength.    Baseline 34.75 seconds, 13.88 seconds with no UE support on 06/09/20    Time 3    Period Weeks    Status Achieved             PT Long Term Goals - 07/17/20 1324      PT LONG TERM GOAL #1   Title Pt will undergo assessment of SOT with LTG written as appropriate. ALL LTGS DUE 07/23/20    Baseline Test peformed on 07-14-20 - score is WNL's - no LTG needed (SD)    Time 4    Period Weeks    Status Achieved      PT LONG TERM GOAL #2   Title Pt will improve DHI to a 16 or less in order to demo less dizziness with functional activities.    Baseline 26 = mild handicap    Time 4    Period Weeks    Status New      PT LONG TERM GOAL #3   Title Pt will improve FOTO to at least a 75 in order to demo improved functional outcomes.    Baseline 66    Time 4    Period Weeks  Status New      PT LONG TERM GOAL #4    Title Pt will report 1/10 or less dizziness/nausea while scrolling on phone in order to demo improved visual motion sensivity.    Baseline 4/10 on 06/25/20    Time 4    Period Weeks    Status New                 Plan - 07/17/20 0951    Clinical Impression Statement Pt is progressing well - reported very minimal dizziness with dynamic gait activities with head turns.  Pt is most challenged by standing balance activities on moving or nonflat surfaces with EC but able to recover LOB independently and reports quick subsiding of the dizziness provoked with these activities.  Plan D/C next week.    Personal Factors and Comorbidities Comorbidity 3+;Profession;Past/Current Experience    Comorbidities PVD, obesity, HTN, DVT, DM    Examination-Activity Limitations Squat;Transfers;Stairs;Locomotion Level;Stand;Sit    Examination-Participation Restrictions Community Activity;Occupation;Cleaning    Stability/Clinical Decision Making Evolving/Moderate complexity    Rehab Potential Good    PT Frequency 1x / week   1x week for 2 weeks, followed by 2x week for 2 weeks   PT Duration 2 weeks   1x week for 2 weeks, followed by 2x week for 2 weeks   PT Treatment/Interventions ADLs/Self Care Home Management;Canalith Repostioning;Gait training;Stair training;Therapeutic activities;Functional mobility training;Therapeutic exercise;Balance training;Neuromuscular re-education;Patient/family education;Vasopneumatic Device;Vestibular    PT Next Visit Plan continue balance on compliant surfaces for incr vestibular input, visual tracking and oculomotor activities.    PT Home Exercise Plan other access code: [facility]         Access Code: [facility]  URL: [email or web address]  Date: 05/19/2020  Prepared by: Markus Jarvis    Exercises  Sit to Stand with Arms Crossed - 1 x daily - 7 x weekly - 2 sets - 10 reps  Standing with half turns-Quick - 1 x daily - 7 x weekly - 1 sets - 3-5 reps    Consulted and Agree  with Plan of Care Patient           Patient will benefit from skilled therapeutic intervention in order to improve the following deficits and impairments:  Abnormal gait,Decreased balance,Decreased activity tolerance,Decreased endurance,Decreased strength,Difficulty walking,Dizziness,Obesity  Visit Diagnosis: Unsteadiness on feet  Dizziness and giddiness     Problem List Patient Active Problem List   Diagnosis Date Noted  . Diplopia   . Ophthalmoplegia of right eye   . CVA (cerebral vascular accident) ([place]) 03/24/2020  . Acute CVA (cerebrovascular accident) ([place]) 03/24/2020  . Hyperlipidemia 12/31/2016  . PVD (peripheral vascular disease) ([place]) 01/29/2016  . Diverticulitis 01/28/2016  . Special screening for malignant neoplasms, colon   . Type 2 diabetes mellitus with diabetic dermatitis, without long-term current use of insulin ([place]) 05/03/2015  . History of deep vein thrombosis (DVT) of lower extremity 03/07/2013  . Ischemia of extremity 02/25/2013  . HTN (hypertension) 05/07/2011  . Erectile dysfunction 05/07/2011  . Obesity 05/07/2011    Alda Lea, PT 07/17/2020, 9:54 AM  [facility] [address] [place], [place], [address] Phone: [phone]   Fax:  [phone]  Name: MAZEN PHO MRN: [id number] Date of Birth: 02-04-1966

## 2020-07-21 ENCOUNTER — Other Ambulatory Visit: Payer: Self-pay

## 2020-07-21 ENCOUNTER — Ambulatory Visit: Payer: BC Managed Care – PPO | Admitting: Physical Therapy

## 2020-07-21 DIAGNOSIS — M6281 Muscle weakness (generalized): Secondary | ICD-10-CM | POA: Diagnosis not present

## 2020-07-21 DIAGNOSIS — R2689 Other abnormalities of gait and mobility: Secondary | ICD-10-CM | POA: Diagnosis not present

## 2020-07-21 DIAGNOSIS — R42 Dizziness and giddiness: Secondary | ICD-10-CM | POA: Diagnosis not present

## 2020-07-21 DIAGNOSIS — R2681 Unsteadiness on feet: Secondary | ICD-10-CM

## 2020-07-21 DIAGNOSIS — R278 Other lack of coordination: Secondary | ICD-10-CM | POA: Diagnosis not present

## 2020-07-22 NOTE — Therapy (Signed)
[facility] [address] [place], [place], [address] Phone: [phone]   Fax:  [phone]  Physical Therapy Treatment  Patient Details  Name: Carlos Phillips MRN: [id number] Date of Birth: 09/28/1965 Referring Provider (PT): Frann Rider   Encounter Date: 07/21/2020   PT End of Session - 07/22/20 2010    Visit Number 13    Number of Visits 15    Authorization Type BCBS - VL: 30 PT/OT/ST combined - each visit of each type counts    Authorization - Visit Number 13    Authorization - Number of Visits 30    PT Start Time 0848    PT Stop Time 0932    PT Time Calculation (min) 44 min    Equipment Utilized During Treatment --   vest harness with SOT   Activity Tolerance Patient tolerated treatment well    Behavior During Therapy WFL for tasks assessed/performed           Past Medical History:  Diagnosis Date  . Diabetes mellitus without complication ([place])   . DVT (deep venous thrombosis) ([place]) 02/2013  . Erectile dysfunction   . Hyperlipidemia   . Hypertension   . Obesity    BMI >52  . PVD (peripheral vascular disease) ([place])     Past Surgical History:  Procedure Laterality Date  . ABDOMINAL AORTAGRAM N/A 02/28/2013   Procedure: ABDOMINAL Maxcine Ham;  Surgeon: Conrad Leslie, MD;  Location: [facility] CATH LAB;  Service: Cardiovascular;  Laterality: N/A;  . COLONOSCOPY WITH PROPOFOL N/A 12/25/2015   Procedure: COLONOSCOPY WITH PROPOFOL;  Surgeon: Manus Gunning, MD;  Location: [facility] ENDOSCOPY;  Service: Gastroenterology;  Laterality: N/A;  . EMBOLECTOMY Left 02/25/2013   Procedure: Left Popliteal EMBOLECTOMY Poss fasciotomy;  Surgeon: Elam Dutch, MD;  Location: [facility] OR;  Service: Vascular;  Laterality: Left;  Left poplital and Tibial embolectomy with four compartment Fasciotomy with vein patch angioplasty left popliteal artery.    There were no vitals filed for this visit.   Subjective Assessment - 07/22/20  2006    Subjective Pt reports he is now walking 10" twice/day - is doing much better    Pertinent History PVD, obesity, HTN, DVT, DM    Limitations Walking    How long can you stand comfortably? 10-15 minutes after standing up in the kitchen    Diagnostic tests MRI of brain showed small acute infarct in the midbrain in the expected region of the Rt oculomotor nucleus as well as a small remote lacunar infarct in the Rt thalamus.    Patient Stated Goals wants to improve the balance    Currently in Pain? No/denies                             [facility] Adult PT Treatment/Exercise - 07/22/20 0001      Ambulation/Gait   Ambulation/Gait Yes    Ambulation/Gait Assistance 6: Modified independent (Device/Increase time)    Assistive device None    Gait Comments amb. tossing ball up, then Rt/Lt sides; making circles clockwise and counterclockwise with SBA - no c/o dizziness with this activity               Balance Exercises - 07/22/20 0001      Balance Exercises: Standing   Standing Eyes Opened Wide (BOA);Head turns;Foam/compliant surface;5 reps   targets - horizontal and vertical   Standing Eyes Closed Wide (BOA);Head turns;Foam/compliant surface;5 reps  horizontal and vertical head turns   Rockerboard Anterior/posterior;EO;EC;10 reps;Intermittent UE support    Turning Right;Left   4 reps to each side   Marching Foam/compliant surface;Head turns;Static;10 reps   EO and EC   Other Standing Exercises standing on 2 pillows in corner - pt performed trunk rotations - side to side and diagonals 5 reps each direction with EO and then with EC with CGA for safety    Other Standing Exercises Comments pt performed amb. tossing ball straight up approx. 40' x 2 reps; then tossing and catching on Rt/Lt sides 40' x 2 reps; pt amb. making patterns with ball improved gaze stabilization - letters "O" both clockwise & counterclockwise, "X" and "+"               PT Short Term Goals -  07/22/20 2012      PT SHORT TERM GOAL #1   Title Pt will be independent with initial HEP in order to build upon functional gains made in therapy. ALL STGS DUE 06/03/20    Baseline pt reports performing HEP consistently at home.    Time 3    Period Weeks    Status Achieved    Target Date 06/03/20      PT SHORT TERM GOAL #2   Title Pt will undergo further vestibular assessment with LTG to be written as appropriate.    Time 3    Period Weeks    Status New    Target Date 06/03/20      PT SHORT TERM GOAL #3   Title Pt will undergo DGI/FGA with LTG as appropriate to demo improved balance and decr fall risk.    Baseline LTG written (05/19/20)    Time 3    Period Weeks    Status Achieved      PT SHORT TERM GOAL #4   Title Pt will improve 5x sit <> stand time to at least 29 seconds or less with BUE support in order to demo improved functional BLE strength.    Baseline 34.75 seconds, 13.88 seconds with no UE support on 06/09/20    Time 3    Period Weeks    Status Achieved             PT Long Term Goals - 07/22/20 2012      PT LONG TERM GOAL #1   Title Pt will undergo assessment of SOT with LTG written as appropriate. ALL LTGS DUE 07/23/20    Baseline Test peformed on 07-14-20 - score is WNL's - no LTG needed (SD)    Time 4    Period Weeks    Status Achieved      PT LONG TERM GOAL #2   Title Pt will improve DHI to a 16 or less in order to demo less dizziness with functional activities.    Baseline 26 = mild handicap    Time 4    Period Weeks    Status New      PT LONG TERM GOAL #3   Title Pt will improve FOTO to at least a 75 in order to demo improved functional outcomes.    Baseline 66    Time 4    Period Weeks    Status New      PT LONG TERM GOAL #4   Title Pt will report 1/10 or less dizziness/nausea while scrolling on phone in order to demo improved visual motion sensivity.    Baseline 4/10 on 06/25/20    Time  4    Period Weeks    Status New                   Patient will benefit from skilled therapeutic intervention in order to improve the following deficits and impairments:  Abnormal gait,Decreased balance,Decreased activity tolerance,Decreased endurance,Decreased strength,Difficulty walking,Dizziness,Obesity  Visit Diagnosis: Unsteadiness on feet  Dizziness and giddiness     Problem List Patient Active Problem List   Diagnosis Date Noted  . Diplopia   . Ophthalmoplegia of right eye   . CVA (cerebral vascular accident) ([place]) 03/24/2020  . Acute CVA (cerebrovascular accident) ([place]) 03/24/2020  . Hyperlipidemia 12/31/2016  . PVD (peripheral vascular disease) ([place]) 01/29/2016  . Diverticulitis 01/28/2016  . Special screening for malignant neoplasms, colon   . Type 2 diabetes mellitus with diabetic dermatitis, without long-term current use of insulin ([place]) 05/03/2015  . History of deep vein thrombosis (DVT) of lower extremity 03/07/2013  . Ischemia of extremity 02/25/2013  . HTN (hypertension) 05/07/2011  . Erectile dysfunction 05/07/2011  . Obesity 05/07/2011    Alda Lea, PT 07/22/2020, 8:13 PM  [facility] [address] [place], [place], [address] Phone: [phone]   Fax:  [phone]  Name: Carlos Phillips MRN: [id number] Date of Birth: 1965/11/11

## 2020-07-23 ENCOUNTER — Other Ambulatory Visit: Payer: Self-pay

## 2020-07-23 ENCOUNTER — Ambulatory Visit: Payer: BC Managed Care – PPO | Admitting: Physical Therapy

## 2020-07-23 DIAGNOSIS — R42 Dizziness and giddiness: Secondary | ICD-10-CM

## 2020-07-23 DIAGNOSIS — M6281 Muscle weakness (generalized): Secondary | ICD-10-CM | POA: Diagnosis not present

## 2020-07-23 DIAGNOSIS — R2689 Other abnormalities of gait and mobility: Secondary | ICD-10-CM

## 2020-07-23 DIAGNOSIS — R2681 Unsteadiness on feet: Secondary | ICD-10-CM

## 2020-07-23 DIAGNOSIS — R278 Other lack of coordination: Secondary | ICD-10-CM | POA: Diagnosis not present

## 2020-07-24 ENCOUNTER — Encounter: Payer: Self-pay | Admitting: Physical Therapy

## 2020-07-24 NOTE — Therapy (Signed)
[facility] [address] [place], [place], [address] Phone: [phone]   Fax:  [phone]  Physical Therapy Treatment & Discharge Summary  Patient Details  Name: Carlos Phillips MRN: [id number] Date of Birth: 04/23/1965 Referring Provider (PT): Frann Rider   Encounter Date: 07/23/2020   PT End of Session - 07/23/20 0936    Visit Number 14    Number of Visits 15    Authorization Type BCBS - VL: 30 PT/OT/ST combined - each visit of each type counts    Authorization - Visit Number 14    Authorization - Number of Visits 30    PT Start Time 0850    PT Stop Time 0935    PT Time Calculation (min) 45 min    Equipment Utilized During Treatment --   vest harness with SOT   Activity Tolerance Patient tolerated treatment well    Behavior During Therapy [facility] for tasks assessed/performed           Past Medical History:  Diagnosis Date  . Diabetes mellitus without complication ([place])   . DVT (deep venous thrombosis) ([place]) 02/2013  . Erectile dysfunction   . Hyperlipidemia   . Hypertension   . Obesity    BMI >52  . PVD (peripheral vascular disease) ([place])     Past Surgical History:  Procedure Laterality Date  . ABDOMINAL AORTAGRAM N/A 02/28/2013   Procedure: ABDOMINAL Maxcine Ham;  Surgeon: Conrad Carthage, MD;  Location: [facility] CATH LAB;  Service: Cardiovascular;  Laterality: N/A;  . COLONOSCOPY WITH PROPOFOL N/A 12/25/2015   Procedure: COLONOSCOPY WITH PROPOFOL;  Surgeon: Manus Gunning, MD;  Location: [facility] ENDOSCOPY;  Service: Gastroenterology;  Laterality: N/A;  . EMBOLECTOMY Left 02/25/2013   Procedure: Left Popliteal EMBOLECTOMY Poss fasciotomy;  Surgeon: Elam Dutch, MD;  Location: [facility] OR;  Service: Vascular;  Laterality: Left;  Left poplital and Tibial embolectomy with four compartment Fasciotomy with vein patch angioplasty left popliteal artery.    There were no vitals filed for this visit.   Subjective  Assessment - 07/24/20 1648    Subjective Pt reports he is still walking 10" a day; reports he is doing much better - feels he is ready for discharge    Pertinent History PVD, obesity, HTN, DVT, DM    Limitations Walking    How long can you stand comfortably? 10-15 minutes after standing up in the kitchen    Diagnostic tests MRI of brain showed small acute infarct in the midbrain in the expected region of the Rt oculomotor nucleus as well as a small remote lacunar infarct in the Rt thalamus.    Patient Stated Goals wants to improve the balance    Currently in Pain? No/denies                             [facility] Adult PT Treatment/Exercise - 07/24/20 0001      Transfers   Five time sit to stand comments  11.4 - no UE support from mat      Ambulation/Gait   Ambulation/Gait Yes    Ambulation/Gait Assistance 6: Modified independent (Device/Increase time)    Assistive device None    Gait Comments amb. tossing ball up, then Rt/Lt sides; making circles clockwise and counterclockwise with SBA - no c/o dizziness with this activity               Balance Exercises - 07/24/20 0001  Balance Exercises: Standing   Standing Eyes Opened Wide (BOA);Head turns;Foam/compliant surface;5 reps   targets - horizontal and vertical   Standing Eyes Closed Wide (BOA);Head turns;Foam/compliant surface;5 reps   horizontal and vertical head turns   Rockerboard Anterior/posterior;EO;EC;10 reps;Intermittent UE support    Marching Foam/compliant surface;Head turns;Static;10 reps   EO and EC   Other Standing Exercises standing on 2 pillows in corner - pt performed trunk rotations - side to side and diagonals 5 reps each direction with EO and then with EC with CGA for safety    Other Standing Exercises Comments pt performed amb. tossing ball straight up approx. 40' x 2 reps; then tossing and catching on Rt/Lt sides 40' x 2 reps; pt amb. making patterns with ball improved gaze stabilization -  letters "O" both clockwise & counterclockwise, "X" and "+"               PT Short Term Goals - 07/24/20 1651      PT SHORT TERM GOAL #1   Title Pt will be independent with initial HEP in order to build upon functional gains made in therapy. ALL STGS DUE 06/03/20    Baseline pt reports performing HEP consistently at home.    Time 3    Period Weeks    Status Achieved    Target Date 06/03/20      PT SHORT TERM GOAL #2   Title Pt will undergo further vestibular assessment with LTG to be written as appropriate.    Time 3    Period Weeks    Status New    Target Date 06/03/20      PT SHORT TERM GOAL #3   Title Pt will undergo DGI/FGA with LTG as appropriate to demo improved balance and decr fall risk.    Baseline LTG written (05/19/20)    Time 3    Period Weeks    Status Achieved      PT SHORT TERM GOAL #4   Title Pt will improve 5x sit <> stand time to at least 29 seconds or less with BUE support in order to demo improved functional BLE strength.    Baseline 34.75 seconds, 13.88 seconds with no UE support on 06/09/20    Time 3    Period Weeks    Status Achieved             PT Long Term Goals - 07/23/20 0855      PT LONG TERM GOAL #1   Title Pt will undergo assessment of SOT with LTG written as appropriate. ALL LTGS DUE 07/23/20    Baseline Test peformed on 07-14-20 - score is WNL's - no LTG needed (SD)    Time 4    Period Weeks    Status Achieved      PT LONG TERM GOAL #2   Title Pt will improve DHI to a 16 or less in order to demo less dizziness with functional activities.    Baseline 26 = mild handicap: score 16/100 on 07-23-20    Time 4    Period Weeks    Status Achieved      PT LONG TERM GOAL #3   Title Pt will improve FOTO to at least a 75 in order to demo improved functional outcomes.    Baseline 66    Time 4    Period Weeks    Status Achieved      PT LONG TERM GOAL #4   Title Pt will report 1/10 or  less dizziness/nausea while scrolling on phone in order  to demo improved visual motion sensivity.    Baseline 4/10 on 06/25/20; goal met    Time 4    Period Weeks    Status Achieved      PT LONG TERM GOAL #5   Baseline 34.75 seconds; 13.88 seconds with no UE support on 06/09/20;  11.4 secs on 07-23-20    Status Achieved                 Plan - 07/24/20 1655    Clinical Impression Statement Pt has met 5/5 LTG's;  Carlos Phillips has improved from 26/100 to 16/100.  Pt reports infrequent occurrence of dizziness of short duration at this time.  Pt is discharged due to goals achieved.    Personal Factors and Comorbidities Comorbidity 3+;Profession;Past/Current Experience    Comorbidities PVD, obesity, HTN, DVT, DM    Examination-Activity Limitations Squat;Transfers;Stairs;Locomotion Level;Stand;Sit    Examination-Participation Restrictions Community Activity;Occupation;Cleaning    Stability/Clinical Decision Making Evolving/Moderate complexity    Rehab Potential Good    PT Frequency 1x / week   1x week for 2 weeks, followed by 2x week for 2 weeks   PT Duration 2 weeks   1x week for 2 weeks, followed by 2x week for 2 weeks   PT Treatment/Interventions ADLs/Self Care Home Management;Canalith Repostioning;Gait training;Stair training;Therapeutic activities;Functional mobility training;Therapeutic exercise;Balance training;Neuromuscular re-education;Patient/family education;Vasopneumatic Device;Vestibular    PT Next Visit Plan Pt is D/C'd    PT Home Exercise Plan other access code: [facility]         Access Code: [facility]  URL: [email or web address]  Date: 05/19/2020  Prepared by: Markus Jarvis    Exercises  Sit to Stand with Arms Crossed - 1 x daily - 7 x weekly - 2 sets - 10 reps  Standing with half turns-Quick - 1 x daily - 7 x weekly - 1 sets - 3-5 reps    Consulted and Agree with Plan of Care Patient           Patient will benefit from skilled therapeutic intervention in order to improve the following deficits and impairments:  Abnormal  gait,Decreased balance,Decreased activity tolerance,Decreased endurance,Decreased strength,Difficulty walking,Dizziness,Obesity  Visit Diagnosis: Unsteadiness on feet  Other abnormalities of gait and mobility  Dizziness and giddiness     Problem List Patient Active Problem List   Diagnosis Date Noted  . Diplopia   . Ophthalmoplegia of right eye   . CVA (cerebral vascular accident) ([place]) 03/24/2020  . Acute CVA (cerebrovascular accident) ([place]) 03/24/2020  . Hyperlipidemia 12/31/2016  . PVD (peripheral vascular disease) ([place]) 01/29/2016  . Diverticulitis 01/28/2016  . Special screening for malignant neoplasms, colon   . Type 2 diabetes mellitus with diabetic dermatitis, without long-term current use of insulin ([place]) 05/03/2015  . History of deep vein thrombosis (DVT) of lower extremity 03/07/2013  . Ischemia of extremity 02/25/2013  . HTN (hypertension) 05/07/2011  . Erectile dysfunction 05/07/2011  . Obesity 05/07/2011       PHYSICAL THERAPY DISCHARGE SUMMARY  Visits from Start of Care: 14  Current functional level related to goals / functional outcomes: See above for progress towards goals - all goals met   Remaining deficits: Cont c/o intermittent dizziness, but of short duration Cont decreased endurance/activity tolerance but this continues to improve    Education / Equipment: Pt has been instructed in HEP for balance and vestibular exercises including a walking program Plan: Patient agrees to discharge.  Patient goals were partially met. Patient is being  discharged due to meeting the stated rehab goals.  ?????        Alda Lea, PT 07/24/2020, 4:59 PM  [facility] [address] [place], [place], [address] Phone: [phone]   Fax:  [phone]  Name: Carlos Phillips MRN: [id number] Date of Birth: Feb 28, 1966

## 2020-08-17 ENCOUNTER — Encounter: Payer: Self-pay | Admitting: Adult Health

## 2020-08-17 ENCOUNTER — Ambulatory Visit (INDEPENDENT_AMBULATORY_CARE_PROVIDER_SITE_OTHER): Payer: BC Managed Care – PPO | Admitting: Adult Health

## 2020-08-17 VITALS — BP 161/106 | HR 78 | Ht 72.0 in | Wt 384.0 lb

## 2020-08-17 DIAGNOSIS — E785 Hyperlipidemia, unspecified: Secondary | ICD-10-CM

## 2020-08-17 DIAGNOSIS — I639 Cerebral infarction, unspecified: Secondary | ICD-10-CM | POA: Diagnosis not present

## 2020-08-17 DIAGNOSIS — E119 Type 2 diabetes mellitus without complications: Secondary | ICD-10-CM

## 2020-08-17 DIAGNOSIS — I10 Essential (primary) hypertension: Secondary | ICD-10-CM

## 2020-08-17 DIAGNOSIS — G4733 Obstructive sleep apnea (adult) (pediatric): Secondary | ICD-10-CM | POA: Diagnosis not present

## 2020-08-17 NOTE — Progress Notes (Signed)
I agree with the above plan 

## 2020-08-17 NOTE — Progress Notes (Signed)
[facility] [address] [place] [id number] [phone]       STROKE FOLLOW UP NOTE  Mr. Carlos CaffeyRichard K Phillips Date of Birth:  Sep 04, 1965 Medical Record Number:  [id number]   Reason for Referral: stroke follow up    SUBJECTIVE:   CHIEF COMPLAINT:  Chief Complaint  Patient presents with  . Follow-up    RM 14 alone Pt is well, still has some dizziness and nausea just about every day.     HPI:   Today, 08/17/2020, Mr. Carlos Phillips returns for 3 month stroke follow-up unaccompanied  Doing well from stroke standpoint without new stroke/TIA symptoms Does report continued dizziness and nausea but usually only when doing exercises as advised by PT especially with exercises that involves his eyes being closed. Will resolve after resting for 5-10 minutes. Has not had to use zofran as symptoms will resolve quickly after resting. He remains on disability through his PCP  Reports compliance on Plavix and atorvastatin without associated side effects Blood pressure today 161/106 - monitors at home and typically stable but usually elevated after increased exertion, exercise or during appointments Does not routinely check BG at home - recently started on Januvia by PCP - A1c 6.9 (3/14) up from 6.4 Underwent sleep study 3/17 which showed severe sleep apnea and recommended pursuing titration study but has not yet scheduled as he has felt overwhelmed with his therapy and home exercises  No further concerns at this time      History provided for reference purposes only Initial visit 04/29/2020 JM: Mr. Carlos Phillips is being seen for hospital follow-up accompanied by his wife.  He does report continued occasional dizziness or imbalance as well as intolerance to prolonged standing due to increased nausea but this has been slowly improving.  He denies residual diplopia but does report upper black floater OD outer superior temporal quadrant typically seen with eye movement and will  quickly resolve.  He also reports light sensitivity.  Denies new or worsening stroke/TIA symptoms.  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 Per patient, PCP is waiting until today's visit to determine duration of disability.  He has not participated in any type of therapy but was referred to neuro rehab PT/OT at discharge (he has not been called to schedule initial evaluations).  He has completed 3 weeks DAPT and remains on Plavix without bleeding or bruising.  Remains on atorvastatin 40 mg daily without myalgias.  Blood pressure today initially elevated and on recheck 148/88.  He does not routinely monitor at home.  He is scheduled for sleep evaluation with Dr. Frances FurbishAthar on 2/8 for suspected sleep apnea.  No further concerns at this time.  Stroke admission 03/24/2020 Mr. Carlos CaffeyRichard K Phillips is a 55 y.o. male with history of supermorbid obesity, DVT, HLD, PVD, DM and HTN  who presented [facility] on 03/24/2020 with binocular diplopia. Personally reviewed hospitalization pertinent progress notes, lab work and imaging with summary provided. Evaluated by Dr. 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 In addition to acute infarct, MRI showed chronic small right thalamic lacunae. CTA head and carotid Dopplers unremarkable. Recommended DAPT for 3 weeks then Plavix alone as on Plavix PTA. History of HTN stable. History of HLD atorvastatin 20 mg PTA with LDL 93 and recommended increased dosage to 40 mg daily. Controlled DM with A1c 6.4. Other stroke risk factors include  former tobacco use, EtOH use, THC use (UDS positive), morbid obesity, PVD, hx of DVT, prior stroke on imaging and suspected sleep apnea. Residual deficit right eye difficulty with downward and inward gaze consistent with right CN IV palsy. Evaluated by therapy and recommended outpatient OT discharge in  stable condition.  Stroke: Small midbrain infarct secondary to small vessel disease source  MRI  Small midbrain infarct. Chronic small R thalamic lacune.  CTA head Unremarkable   Carotid Doppler  B ICA 1-39% stenosis, VAs antegrade   2D Echo EF 60 to 65%  LDL 93  HgbA1c 6.4  VTE prophylaxis - Lovenox 90 mg sq daily   clopidogrel 75 mg daily prior to admission, now on aspirin 81 mg daily and clopidogrel 75 mg daily. Continue DAPT x 3 weeks then Plavix alone.   Therapy recommendations:  outpt PT/OT  Disposition:   Home      ROS:   14 system review of systems performed and negative with exception of those listed in HPI  PMH:  Past Medical History:  Diagnosis Date  . Diabetes mellitus without complication ([place])   . DVT (deep venous thrombosis) ([place]) 02/2013  . Erectile dysfunction   . Hyperlipidemia   . Hypertension   . Obesity    BMI >52  . PVD (peripheral vascular disease) ([facility])     PSH:  Past Surgical History:  Procedure Laterality Date  . ABDOMINAL AORTAGRAM N/A 02/28/2013   Procedure: ABDOMINAL Maxcine Ham;  Surgeon: Conrad Chincoteague, MD;  Location: [facility] CATH LAB;  Service: Cardiovascular;  Laterality: N/A;  . COLONOSCOPY WITH PROPOFOL N/A 12/25/2015   Procedure: COLONOSCOPY WITH PROPOFOL;  Surgeon: Manus Gunning, MD;  Location: [facility] ENDOSCOPY;  Service: Gastroenterology;  Laterality: N/A;  . EMBOLECTOMY Left 02/25/2013   Procedure: Left Popliteal EMBOLECTOMY Poss fasciotomy;  Surgeon: Elam Dutch, MD;  Location: [facility] OR;  Service: Vascular;  Laterality: Left;  Left poplital and Tibial embolectomy with four compartment Fasciotomy with vein patch angioplasty left popliteal artery.    Social History:  Social History   Socioeconomic History  . Marital status: Married    Spouse name: Ivin Booty  . Number of children: 2  . Years of education: Not on file  . Highest education level: Not on file  Occupational History  . Occupation: Truck Geophysicist/field seismologist  Tobacco Use  .  Smoking status: Former Smoker    Packs/day: 1.00    Years: 30.00    Pack years: 30.00    Types: Cigarettes    Quit date: 02/25/2013    Years since quitting: 7.4  . Smokeless tobacco: Never Used  Vaping Use  . Vaping Use: Never used  Substance and Sexual Activity  . Alcohol use: Yes    Alcohol/week: 4.0 standard drinks    Types: 4 Standard drinks or equivalent per week    Comment: social- rare  . Drug use: No  . Sexual activity: Yes    Partners: Female  Other Topics Concern  . Not on file  Social History Narrative  . Not on file   Social Determinants of Health   Financial Resource Strain: Not on file  Food Insecurity: Not on file  Transportation Needs: Not on file  Physical Activity: Not on file  Stress: Not on file  Social Connections: Not on file  Intimate Partner Violence: Not on file    Family History:  Family History  Problem Relation Age of Onset  . Diabetes Mother   . Heart disease Father   . Asthma  Son   . Colon cancer Neg Hx     Medications:   Current Outpatient Medications on File Prior to Visit  Medication Sig Dispense Refill  . amLODipine (NORVASC) 10 MG tablet Take 1 tablet (10 mg total) by mouth daily. 90 tablet 1  . atorvastatin (LIPITOR) 40 MG tablet Take 1 tablet (40 mg total) by mouth daily. 90 tablet 1  . clopidogrel (PLAVIX) 75 MG tablet TAKE 1 TABLET BY MOUTH EVERY DAY WITH BREAKFAST 90 tablet 1  . famotidine (PEPCID) 20 MG tablet Take 1 tablet (20 mg total) by mouth 2 (two) times daily. 60 tablet 1  . glucose blood test strip Use as instructed 100 each 12  . hydrochlorothiazide (HYDRODIURIL) 25 MG tablet Take 1 tablet (25 mg total) by mouth daily. 90 tablet 1  . Lancets (ONETOUCH ULTRASOFT) lancets Use as instructed 100 each 12  . lisinopril (ZESTRIL) 5 MG tablet Take 1 tablet (5 mg total) by mouth daily. 90 tablet 1  . Multiple Vitamins-Minerals (MULTIVITAMIN WITH MINERALS) tablet Take 1 tablet by mouth daily.    . ondansetron (ZOFRAN ODT) 4  MG disintegrating tablet Take 1 tablet (4 mg total) by mouth every 8 (eight) hours as needed for nausea or vomiting. 20 tablet 4  . sildenafil (VIAGRA) 100 MG tablet TAKE 1/2 TO 1 TABLET BY MOUTH EVERY DAY AS NEEDED FOR ERECTILE DYSFUNCTION 4 tablet 3  . sitaGLIPtin (JANUVIA) 50 MG tablet Take 1 tablet (50 mg total) by mouth daily. 90 tablet 1  . triamcinolone (KENALOG) 0.1 % Apply 1 application topically 2 (two) times daily. 80 g 1   No current facility-administered medications on file prior to visit.    Allergies:   Allergies  Allergen Reactions  . Morphine And Related Nausea And Vomiting      OBJECTIVE:  Physical Exam  Vitals:   08/17/20 0901  BP: (!) 161/106  Pulse: 78  Weight: (!) 384 lb (174.2 kg)  Height: 6' (1.829 m)   Body mass index is 52.08 kg/m. No exam data present  General: Morbidly obese pleasant middle-aged African-American male, seated, in no evident distress Head: head normocephalic and atraumatic.   Neck: supple with no carotid or supraclavicular bruits Cardiovascular: regular rate and rhythm, no murmurs Musculoskeletal: no deformity Skin:  no rash/petichiae Vascular:  Normal pulses all extremities   Neurologic Exam Mental Status: Awake and fully alert.   Fluent speech and language.  Oriented to place and time. Recent and remote memory intact. Attention span, concentration and fund of knowledge appropriate. Mood and affect appropriate.  Cranial Nerves: Pupils equal, briskly reactive to light. Extraocular movements full without nystagmus.  Visual fields full to confrontation. Hearing intact. Facial sensation intact. Face, tongue, palate moves normally and symmetrically.  Motor: Normal bulk and tone. Normal strength in all tested extremity muscles Sensory.: intact to touch , pinprick , position and vibratory sensation.  Coordination: Rapid alternating movements normal in all extremities. Finger-to-nose and heel-to-shin performed accurately  bilaterally. Gait and Station: Arises from chair without difficulty. Stance is normal. Gait demonstrates normal stride length but mild imbalance especially with turns.  Ambulates without assistive device.  Able to tandem walk and heel toe without difficulty. Romberg positive.  Reflexes: 1+ and symmetric. Toes downgoing.        ASSESSMENT: KINSLEY NICKLAUS is a 55 y.o. year old male presented with binocular diplopia on 03/24/2020 with stroke work-up revealing small midbrain infarct secondary to small vessel disease. Vascular risk factors include HTN, HLD, DM, hx  of tobacco use, EtOH use, substance abuse (THC), morbid obesity, PVD, hx of DVT and prior strokes on imaging and recent diagnosis of severe OSA.      PLAN:  1. midbrain stroke:  a. Residual deficit: Imbalance, intermittent dizziness and nausea i. Recently completed PT -encouraged continued HEP at home for hopeful further recovery.  Continued use of Zofran as needed for nausea -advised to trial Zofran dose prophylactically prior to activity ii. Remains on short-term disability assisted by his PCP b. Continue clopidogrel 75 mg daily  and atorvastatin 40 mg daily for secondary stroke prevention.   c. Discussed secondary stroke prevention measures and importance of close PCP follow up for aggressive stroke risk factor management  2. HTN: BP goal <130/90.  Elevated today on amlodipine and hydrochlorothiazide -advised to continue monitoring at home and follow-up with PCP if remains elevated 3. HLD: LDL goal <70. prior LDL 149 (3/14) up from 93 on atorvastatin 40 mg daily.  Request repeat lipid panel at follow-up visit with PCP and if remains above goal, would recommend increase statin dosage 4. DMII: A1c goal<7.0.  Controlled with recent A1c 6.9 (3/14) on Januvia per PCP 5. Severe sleep apnea: Sleep study 06/11/2020 showed severe OSA with total AHI 38.8/h and O2 nadir of 83% and recommended proceeding with CPAP titration - patient scheduled  titration study prior to leaving office today    Follow up in 6 months or call earlier if needed   CC:  [place] provider: Dr. Suzi Roots, Ranell Patrick, MD     Frann Rider, [facility]  [facility] [address] [place], [place] [address]  Phone [phone] Fax [phone] Note: This document was prepared with digital dictation and possible smart phrase technology. Any transcriptional errors that result from this process are unintentional.

## 2020-08-17 NOTE — Patient Instructions (Addendum)
Continue doing exercises at home - if you would like to do additional therapy in the future let me know Use of zofran as needed - can try to take prior to activity to see if this prevents onset of nausea  Schedule for sleep titration study for severe sleep apnea  Continue clopidogrel 75 mg daily  and atorvastatin  for secondary stroke prevention  Continue to follow up with PCP regarding cholesterol, blood pressure and diabetes management  Maintain strict control of hypertension with blood pressure goal below 130/90, diabetes with hemoglobin A1c goal below 7% and cholesterol with LDL cholesterol (bad cholesterol) goal below 70 mg/dL.       Followup in the future with me in 6 months or call earlier if needed       Thank you for coming to see [place] at [facility]. I hope we have been able to provide you high quality care today.  You may receive a patient satisfaction survey over the next few weeks. We would appreciate your feedback and comments so that we may continue to improve ourselves and the health of our patients.     Sleep Apnea Sleep apnea affects breathing during sleep. It causes breathing to stop for a short time or to become shallow. It can also increase the risk of:  Heart attack.  Stroke.  Being very overweight (obese).  Diabetes.  Heart failure.  Irregular heartbeat. The goal of treatment is to help you breathe normally again. What are the causes? There are three kinds of sleep apnea:  Obstructive sleep apnea. This is caused by a blocked or collapsed airway.  Central sleep apnea. This happens when the brain does not send the right signals to the muscles that control breathing.  Mixed sleep apnea. This is a combination of obstructive and central sleep apnea. The most common cause of this condition is a collapsed or blocked airway. This can happen if:  Your throat muscles are too relaxed.  Your tongue and tonsils are too large.  You  are overweight.  Your airway is too small.   What increases the risk?  Being overweight.  Smoking.  Having a small airway.  Being older.  Being male.  Drinking alcohol.  Taking medicines to calm yourself (sedatives or tranquilizers).  Having family members with the condition. What are the signs or symptoms?  Trouble staying asleep.  Being sleepy or tired during the day.  Getting angry a lot.  Loud snoring.  Headaches in the morning.  Not being able to focus your mind (concentrate).  Forgetting things.  Less interest in sex.  Mood swings.  Personality changes.  Feelings of sadness (depression).  Waking up a lot during the night to pee (urinate).  Dry mouth.  Sore throat. How is this diagnosed?  Your medical history.  A physical exam.  A test that is done when you are sleeping (sleep study). The test is most often done in a sleep lab but may also be done at home. How is this treated?  Sleeping on your side.  Using a medicine to get rid of mucus in your nose (decongestant).  Avoiding the use of alcohol, medicines to help you relax, or certain pain medicines (narcotics).  Losing weight, if needed.  Changing your diet.  Not smoking.  Using a machine to open your airway while you sleep, such as: ? An oral appliance. This is a mouthpiece that shifts your lower jaw forward. ? A CPAP device. This device blows air through  a mask when you breathe out (exhale). ? An EPAP device. This has valves that you put in each nostril. ? A BPAP device. This device blows air through a mask when you breathe in (inhale) and breathe out.  Having surgery if other treatments do not work. It is important to get treatment for sleep apnea. Without treatment, it can lead to:  High blood pressure.  Coronary artery disease.  In men, not being able to have an erection (impotence).  Reduced thinking ability.   Follow these instructions at home: Lifestyle  Make  changes that your doctor recommends.  Eat a healthy diet.  Lose weight if needed.  Avoid alcohol, medicines to help you relax, and some pain medicines.  Do not use any products that contain nicotine or tobacco, such as cigarettes, e-cigarettes, and chewing tobacco. If you need help quitting, ask your doctor. General instructions  Take over-the-counter and prescription medicines only as told by your doctor.  If you were given a machine to use while you sleep, use it only as told by your doctor.  If you are having surgery, make sure to tell your doctor you have sleep apnea. You may need to bring your device with you.  Keep all follow-up visits as told by your doctor. This is important. Contact a doctor if:  The machine that you were given to use during sleep bothers you or does not seem to be working.  You do not get better.  You get worse. Get help right away if:  Your chest hurts.  You have trouble breathing in enough air.  You have an uncomfortable feeling in your back, arms, or stomach.  You have trouble talking.  One side of your body feels weak.  A part of your face is hanging down. These symptoms may be an emergency. Do not wait to see if the symptoms will go away. Get medical help right away. Call your local emergency services (911 in the [place] Do not drive yourself to the hospital. Summary  This condition affects breathing during sleep.  The most common cause is a collapsed or blocked airway.  The goal of treatment is to help you breathe normally while you sleep. This information is not intended to replace advice given to you by your health care provider. Make sure you discuss any questions you have with your health care provider. Document Revised: 12/29/2017 Document Reviewed: 11/07/2017 Elsevier Patient Education  [place].

## 2020-09-23 ENCOUNTER — Other Ambulatory Visit: Payer: Self-pay

## 2020-09-23 ENCOUNTER — Ambulatory Visit (INDEPENDENT_AMBULATORY_CARE_PROVIDER_SITE_OTHER): Payer: BC Managed Care – PPO | Admitting: Neurology

## 2020-09-23 DIAGNOSIS — I639 Cerebral infarction, unspecified: Secondary | ICD-10-CM

## 2020-09-23 DIAGNOSIS — R351 Nocturia: Secondary | ICD-10-CM

## 2020-09-23 DIAGNOSIS — G4720 Circadian rhythm sleep disorder, unspecified type: Secondary | ICD-10-CM

## 2020-09-23 DIAGNOSIS — Z8673 Personal history of transient ischemic attack (TIA), and cerebral infarction without residual deficits: Secondary | ICD-10-CM

## 2020-09-23 DIAGNOSIS — G4739 Other sleep apnea: Secondary | ICD-10-CM

## 2020-09-23 DIAGNOSIS — G4733 Obstructive sleep apnea (adult) (pediatric): Secondary | ICD-10-CM

## 2020-09-23 DIAGNOSIS — Z6841 Body Mass Index (BMI) 40.0 and over, adult: Secondary | ICD-10-CM

## 2020-09-23 DIAGNOSIS — Z9989 Dependence on other enabling machines and devices: Secondary | ICD-10-CM

## 2020-10-07 NOTE — Procedures (Signed)
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 His baseline sleep study from 06/11/20 showed severe obstructive sleep apnea, with a total AHI of 38.8/hour, REM AHI of 31.4/hour, and O2 nadir of 83%. The absence of supine sleep may have underestimated his AHI and O2 nadir. The patient endorsed the Epworth Sleepiness Scale at 8 points. The patient's weight 378 pounds with a height of 72 (inches), resulting in a BMI of 51.1 kg/m2. The patient's neck circumference measured 18.2 inches.  CURRENT MEDICATIONS: Norvasc, Lipitor, Plavix, Pepcid, Hydrodiuril, Zestril, Multivitamins, Zofran, Viagra, Januvia, Kenalog  PROCEDURE:  This is a multichannel digital polysomnogram utilizing the SomnoStar 11.2 system.  Electrodes and sensors were applied and monitored per AASM Specifications.   EEG, EOG, Chin and Limb EMG, were sampled at 200 Hz.  ECG, Snore and Nasal Pressure, Thermal Airflow, Respiratory Effort, CPAP Flow and Pressure, Oximetry was sampled at 50 Hz. Digital video and audio were recorded.       The patien was fitted with a large Vitera FFM from [employer]. CPAP was initiated at 5 cmH20 with heated humidity per AASM standards and pressure was gradually titrated to a pressure of 10 cm.  On a pressure of 9 and 10 cm, he started having central apneas.  AHI on a pressure of 9 cm was 15.3/h.  He was therefore started on BiPAP therapy with a pressure of 15/10 centimeters and continued to have central apneas.  A backup rate of 12/min was added and BiPAP ST was titrated from 12.7 cm to a final pressure of  21/16 centimeters.  He achieved little sleep on BiPAP and throughout the night he had significant sleep disruption.  This was a challenging titration.  On a BiPAP ST of 21/16 with a rate of 12 his AHI was 3.9/h, O2 nadir 92% with only nonsupine and non-REM sleep achieved on this setting.  Lights Out was at 21:32 and Lights On at 05:18. Total recording time (TRT) was 466.5 minutes, with a total sleep time (TST) of 262 minutes. The patient's sleep latency was 61 minutes. REM latency was 93 minutes.  The sleep efficiency was 56.2%, which is reduced.    SLEEP ARCHITECTURE: WASO (Wake after sleep onset)  was 87 minutes with mild to moderate and at times severe sleep fragmentation noted.  There were 14.5 minutes in Stage N1, 220.5 minutes Stage N2, 0 minutes Stage N3 and 27 minutes in Stage REM.  The percentage of Stage N1 was 5.5%, Stage N2 was 84.2%, which is markedly increased, Stage N3 was absent and Stage R (REM sleep) was 10.3%, which is reduced. The arousals were noted as: 57 were spontaneous, 0 were associated with PLMs, 64 were associated with respiratory events.  RESPIRATORY ANALYSIS:  There was a total of 81 respiratory events: 8 obstructive apneas, 18 central apneas and 8 mixed apneas with a total of 34 apneas and an apnea index (AI) of 7.8 /hour. There were 47 hypopneas with a hypopnea index of 10.8/hour. The patient also had 0 respiratory event related arousals (RERAs).  The total APNEA/HYPOPNEA INDEX  (AHI) was 18.5 /hour and the total RESPIRATORY DISTURBANCE INDEX was 18.5 /hour  11 events occurred in REM sleep and 70 events in NREM. The REM AHI was 24.4 /hour versus a non-REM AHI of 17.9 /hour.  The patient spent 29 minutes of total sleep time in the supine position and 233 minutes in non-supine. The supine AHI was 47.6, versus a non-supine AHI of 15.0.  OXYGEN SATURATION & C02:  The baseline 02 saturation was 96%, with the lowest being 82%. Time spent below 89% saturation equaled 6  minutes.  PERIODIC LIMB MOVEMENTS:  The patient had a total of 0 Periodic Limb Movements. The Periodic Limb Movement (PLM) index was 0 and the PLM Arousal index was 0 /hour.  Audio and video analysis did not show any abnormal or unusual movements, behaviors, phonations or vocalizations. The patient took 1 bathroom break. The EKG was in keeping with normal sinus rhythm (NSR).  Post-study, the patient indicated that sleep was the same as usual.   IMPRESSION:   Obstructive Sleep Apnea (OSA) Treatment emergent central sleep apnea Insufficient treatment with CPAP Dysfunctions associated with sleep stages or arousal from sleep   RECOMMENDATIONS:   This was a difficult titration. The study demonstrates insufficient treatment of his sleep apnea with CPAP therapy or standard BiPAP therapy.  He had improvement with regards to his treatment emergent central apneas using BiPAP ST.  He required a significantly higher pressure of 21/16 with a rate of 12/min.  I will advised the patient to start BiPAP ST at home via large fullface mask.  He will be reminded to continue to work on weight loss.  The patient will be advised to be fully compliant with PAP therapy to improve sleep related symptoms and decrease long term cardiovascular risks. The patient should be reminded, that it may take up to 3 months to get fully used to using PAP with all planned sleep. The earlier full compliance is achieved, the better long term compliance tends to be. Please note that untreated obstructive sleep apnea carries additional perioperative morbidity. Patients with significant obstructive sleep apnea should receive perioperative PAP therapy and the surgeons and particularly the anesthesiologist should be informed of the diagnosis and the severity of the sleep disordered breathing. This study shows significant sleep fragmentation and abnormal sleep stage percentages; these are nonspecific findings and per se do not signify an  intrinsic sleep disorder or a cause for the patient's sleep-related symptoms. Causes include (but are not limited to) the first night effect of the sleep study, circadian rhythm disturbances, medication effect or an underlying mood disorder or medical problem. The patient should be cautioned not to drive, work at heights, or operate dangerous or heavy equipment when tired or sleepy. Review and reiteration of good sleep hygiene measures should be pursued with any patient. The patient will be seen in follow-up in the sleep clinic at [facility] for discussion of the test results, symptom and treatment compliance review, further management strategies, etc. The referring provider will be notified of the test results.   I certify that I have reviewed the entire raw data recording prior to the issuance of this report in accordance with the Standards of Accreditation of the American Academy of Sleep Medicine (AASM)     Star Age, MD, PhD Diplomat, American Board of Neurology and Sleep Medicine (Neurology and Sleep Medicine)

## 2020-10-07 NOTE — Addendum Note (Signed)
Addended by: Star Age on: 10/07/2020 06:30 PM   Modules accepted: Orders

## 2020-10-08 ENCOUNTER — Telehealth: Payer: Self-pay

## 2020-10-08 NOTE — Telephone Encounter (Signed)
-----   Message from Star Age, MD sent at 10/07/2020  6:30 PM EDT ----- Patient referred by Dr. Carlota Raspberry, seen by me on 05/05/20, diagnostic PSG on 06/11/20. Patient had a CPAP titration study on 09/23/20.  Please call and inform patient that I have entered an order for treatment with positive airway pressure (PAP) treatment for obstructive sleep apnea (OSA). He did fairly during the latest sleep study with BiPAP. He had difficulty staying asleep and required quite a high pressure and did not respond well to standard treatment with CPAP.  We will request a BiPAP machine through his insurance via a DME (durable medical equipment) company.  Please remind patient to be compliant with treatment.  He will need a follow-up within 61 to 89 days after starting BiPAP. May see one of our nurse practitioners, he is established with Janett Billow in stroke clinic, can be scheduled with her as well.   Star Age, MD, PhD [facility])

## 2020-10-08 NOTE — Telephone Encounter (Signed)
I called pt. I advised pt that Dr. Rexene Alberts reviewed their sleep study results and found that pt did well during titration study on BiPAP. Dr. Rexene Alberts recommends that pt BiPAP therapy at home for treatment. I reviewed PAP compliance expectations with the pt. Pt is agreeable to starting a BiPAP. I advised pt that an order will be sent to a DME, [employer], and [employer] will call the pt within about one week after they file with the pt's insurance. [employer] will show the pt how to use the machine, fit for masks, and troubleshoot the BiPAP if needed. A follow up appt was made for insurance purposes with Dr. Rexene Alberts on 01/24/2021 at 100 pm. Pt verbalized understanding to arrive 15 minutes early and bring their BiPAP. A letter with all of this information in it will be mailed to the pt as a reminder. I verified with the pt that the address we have on file is correct. Pt verbalized understanding of results. Pt had no questions at this time but was encouraged to call back if questions arise. I have sent the order to [employer] and have received confirmation that they have received the order.

## 2021-01-25 ENCOUNTER — Ambulatory Visit: Payer: Self-pay | Admitting: Neurology

## 2021-02-17 DIAGNOSIS — G4731 Primary central sleep apnea: Secondary | ICD-10-CM | POA: Diagnosis not present

## 2021-02-22 ENCOUNTER — Ambulatory Visit: Payer: BC Managed Care – PPO | Admitting: Adult Health

## 2021-02-24 ENCOUNTER — Telehealth: Payer: Self-pay | Admitting: Adult Health

## 2021-02-24 NOTE — Telephone Encounter (Signed)
Pt was scheduled for Initial CPAP visit on 05/05/21. Pt was informed to bring machine and power cord to appt. DME: [place] Phone: [phone] Fax: [phone] Equipment issued: Danelle Earthly 10 ST on 02/17/21 Pt to be scheduled between 04/18/21-05/18/21

## 2021-02-25 ENCOUNTER — Ambulatory Visit: Payer: BC Managed Care – PPO | Admitting: Adult Health

## 2021-03-19 ENCOUNTER — Other Ambulatory Visit: Payer: Self-pay | Admitting: Family Medicine

## 2021-03-19 DIAGNOSIS — I739 Peripheral vascular disease, unspecified: Secondary | ICD-10-CM

## 2021-03-19 DIAGNOSIS — E781 Pure hyperglyceridemia: Secondary | ICD-10-CM

## 2021-03-19 DIAGNOSIS — Z8673 Personal history of transient ischemic attack (TIA), and cerebral infarction without residual deficits: Secondary | ICD-10-CM

## 2021-03-19 DIAGNOSIS — G4731 Primary central sleep apnea: Secondary | ICD-10-CM | POA: Diagnosis not present

## 2021-04-15 ENCOUNTER — Ambulatory Visit: Payer: BC Managed Care – PPO | Admitting: Adult Health

## 2021-04-18 ENCOUNTER — Other Ambulatory Visit: Payer: Self-pay | Admitting: Family Medicine

## 2021-04-18 DIAGNOSIS — I739 Peripheral vascular disease, unspecified: Secondary | ICD-10-CM

## 2021-04-18 DIAGNOSIS — E781 Pure hyperglyceridemia: Secondary | ICD-10-CM

## 2021-04-18 DIAGNOSIS — Z8673 Personal history of transient ischemic attack (TIA), and cerebral infarction without residual deficits: Secondary | ICD-10-CM

## 2021-04-19 DIAGNOSIS — G4731 Primary central sleep apnea: Secondary | ICD-10-CM | POA: Diagnosis not present

## 2021-05-04 ENCOUNTER — Other Ambulatory Visit: Payer: Self-pay | Admitting: Family Medicine

## 2021-05-04 DIAGNOSIS — E781 Pure hyperglyceridemia: Secondary | ICD-10-CM

## 2021-05-04 DIAGNOSIS — I739 Peripheral vascular disease, unspecified: Secondary | ICD-10-CM

## 2021-05-04 DIAGNOSIS — Z8673 Personal history of transient ischemic attack (TIA), and cerebral infarction without residual deficits: Secondary | ICD-10-CM

## 2021-05-04 NOTE — Telephone Encounter (Signed)
Contacted pt, reminded him to bring machine in with him to his appt. Pt understood

## 2021-05-05 ENCOUNTER — Encounter: Payer: Self-pay | Admitting: Adult Health

## 2021-05-05 ENCOUNTER — Other Ambulatory Visit: Payer: Self-pay

## 2021-05-05 ENCOUNTER — Ambulatory Visit: Payer: BC Managed Care – PPO | Admitting: Adult Health

## 2021-05-05 VITALS — BP 148/92 | HR 109 | Ht 72.0 in | Wt 370.0 lb

## 2021-05-05 DIAGNOSIS — I639 Cerebral infarction, unspecified: Secondary | ICD-10-CM | POA: Diagnosis not present

## 2021-05-05 DIAGNOSIS — G4733 Obstructive sleep apnea (adult) (pediatric): Secondary | ICD-10-CM | POA: Diagnosis not present

## 2021-05-05 NOTE — Progress Notes (Signed)
[facility] [address] [place]. [place] [address] [phone]       OFFICE FOLLOW UP NOTE  Mr. Carlos Phillips Date of Birth:  January 17, 1966 Medical Record Number:  [id number]   Reason for visit: stroke and initial CPAP follow up    SUBJECTIVE:   CHIEF COMPLAINT:  Chief Complaint  Patient presents with   Obstructive Sleep Apnea    RM 3 alone Pt is well and stable from stroke standpoint, has improved.  Reports complications with CPAP, feels like air may be to forceful, wakes up with dry mouth and chest hurting. He did not have those problems while here doing sleep study.     HPI:   Update 05/05/2021 JM: returns for stroke follow up after prior visit 9 months ago and for initial CPAP compliance visit. From stroke standpoint, has been stable without new stroke/TIA symptoms.  Reports great improvement since prior visit with no recent episodes of dizziness or nausea.  Has f/u tomorrow with PCP and plans on discussing return back to work as he has been on disability since his stroke.  Compliant on Plavix and atorvastatin without side effects.  Blood pressure today 148/92.  Does not routinely monitor at home. Plans on lab work tomorrow at f/u visit with PCP.  Patient initiated CPAP in 01/2021 with compliance report from 04/05/2021 -05/04/2021 with 13 out of 30 usage days for 43% compliance and poor days greater than 4 hours.  Average usage 3 hours and 20 minutes.  Residual AHI 4.5 on set pressure of +21/16 with respiratory rate of 12/min.  Leaks in the 95 percentile 22.3. has been having difficulty with full face mask due to leaks on the upper part of mask. Also experiencing dry mouth. He feels discouraged as he felt great after titration study with no difficulty tolerating mask but now having issues. He reports contacting DME company who told him they were sending him a nasal pillow mask to try. He has lost approx 15 lbs since prior visit. Epworth Sleepiness Scale 7. Fatigue  severity scale 45.  No further concerns at this time.     History provided for reference purposes only Update 08/17/2020 JM: Mr. [name] returns for 3 month stroke follow-up unaccompanied  Doing well from stroke standpoint without new stroke/TIA symptoms Does report continued dizziness and nausea but usually only when doing exercises as advised by PT especially with exercises that involves his eyes being closed. Will resolve after resting for 5-10 minutes. Has not had to use zofran as symptoms will resolve quickly after resting. He remains on disability through his PCP  Reports compliance on Plavix and atorvastatin without associated side effects Blood pressure today 161/106 - monitors at home and typically stable but usually elevated after increased exertion, exercise or during appointments Does not routinely check BG at home - recently started on Januvia by PCP - A1c 6.9 (3/14) up from 6.4 Underwent sleep study 3/17 which showed severe sleep apnea and recommended pursuing titration study but has not yet scheduled as he has felt overwhelmed with his therapy and home exercises  No further concerns at this time  Initial visit 04/29/2020 JM: Mr. [name] is being seen for hospital follow-up accompanied by his wife.  He does report continued occasional dizziness or imbalance as well as intolerance to prolonged standing due to increased nausea but this has been slowly improving.  He denies residual diplopia but does report upper black floater OD outer superior temporal quadrant typically seen with eye movement and  will quickly resolve.  He also reports light sensitivity.  Denies new or worsening stroke/TIA symptoms.  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 Per patient, PCP is waiting until today's visit to determine duration of disability.  He has not participated in any type of therapy but was referred  to neuro rehab PT/OT at discharge (he has not been called to schedule initial evaluations).  He has completed 3 weeks DAPT and remains on Plavix without bleeding or bruising.  Remains on atorvastatin 40 mg daily without myalgias.  Blood pressure today initially elevated and on recheck 148/88.  He does not routinely monitor at home.  He is scheduled for sleep evaluation with Dr. Rexene Alberts on 2/8 for suspected sleep apnea.  No further concerns at this time.  Stroke admission 03/24/2020 Mr. Carlos Phillips is a 57 y.o. male with history of supermorbid obesity, DVT, HLD, PVD, DM and HTN  who presented to [facility] ED on 03/24/2020 with binocular diplopia. Personally reviewed hospitalization pertinent progress notes, lab work and imaging with summary provided. Evaluated by Dr. 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 In addition to acute infarct, MRI showed chronic small right thalamic lacunae. CTA head and carotid Dopplers unremarkable. Recommended DAPT for 3 weeks then Plavix alone as on Plavix PTA. History of HTN stable. History of HLD atorvastatin 20 mg PTA with LDL 93 and recommended increased dosage to 40 mg daily. Controlled DM with A1c 6.4. Other stroke risk factors include former tobacco use, EtOH use, THC use (UDS positive), morbid obesity, PVD, hx of DVT, prior stroke on imaging and suspected sleep apnea. Residual deficit right eye difficulty with downward and inward gaze consistent with right CN IV palsy. Evaluated by therapy and recommended outpatient OT discharge in stable condition.  Stroke: Small midbrain infarct secondary to small vessel disease source MRI  Small midbrain infarct. Chronic small R thalamic lacune. CTA head Unremarkable  Carotid Doppler  B ICA 1-39% stenosis, VAs antegrade  2D Echo EF 60 to 65% LDL 93 HgbA1c 6.4 VTE prophylaxis - Lovenox 90 mg sq daily  clopidogrel 75 mg daily prior to admission, now on aspirin 81 mg daily and  clopidogrel 75 mg daily. Continue DAPT x 3 weeks then Plavix alone.  Therapy recommendations:  outpt PT/OT Disposition:   Home      ROS:   14 system review of systems performed and negative with exception of those listed in HPI  PMH:  Past Medical History:  Diagnosis Date   Diabetes mellitus without complication ([place])    DVT (deep venous thrombosis) ([facility]) 02/2013   Erectile dysfunction    Hyperlipidemia    Hypertension    Obesity    BMI >52   PVD (peripheral vascular disease) ([facility])     PSH:  Past Surgical History:  Procedure Laterality Date   ABDOMINAL AORTAGRAM N/A 02/28/2013   Procedure: ABDOMINAL Maxcine Ham;  Surgeon: Conrad Tigerville, MD;  Location: [facility] CATH LAB;  Service: Cardiovascular;  Laterality: N/A;   COLONOSCOPY WITH PROPOFOL N/A 12/25/2015   Procedure: COLONOSCOPY WITH PROPOFOL;  Surgeon: Manus Gunning, MD;  Location: [facility] ENDOSCOPY;  Service: Gastroenterology;  Laterality: N/A;   EMBOLECTOMY Left 02/25/2013   Procedure: Left Popliteal EMBOLECTOMY Poss fasciotomy;  Surgeon: Elam Dutch, MD;  Location: [facility] OR;  Service: Vascular;  Laterality: Left;  Left poplital and Tibial embolectomy with four compartment Fasciotomy  with vein patch angioplasty left popliteal artery.    Social History:  Social History   Socioeconomic History   Marital status: Married    Spouse name: Ivin Booty   Number of children: 2   Years of education: Not on file   Highest education level: Not on file  Occupational History   Occupation: Truck driver  Tobacco Use   Smoking status: Former    Packs/day: 1.00    Years: 30.00    Pack years: 30.00    Types: Cigarettes    Quit date: 02/25/2013    Years since quitting: 8.1   Smokeless tobacco: Never  Vaping Use   Vaping Use: Never used  Substance and Sexual Activity   Alcohol use: Yes    Alcohol/week: 4.0 standard drinks    Types: 4 Standard drinks or equivalent per week    Comment: social- rare   Drug use: No   Sexual activity:  Yes    Partners: Female  Other Topics Concern   Not on file  Social History Narrative   Not on file   Social Determinants of Health   Financial Resource Strain: Not on file  Food Insecurity: Not on file  Transportation Needs: Not on file  Physical Activity: Not on file  Stress: Not on file  Social Connections: Not on file  Intimate Partner Violence: Not on file    Family History:  Family History  Problem Relation Age of Onset   Diabetes Mother    Heart disease Father    Asthma Son    Colon cancer Neg Hx     Medications:   Current Outpatient Medications on File Prior to Visit  Medication Sig Dispense Refill   amLODipine (NORVASC) 10 MG tablet Take 1 tablet (10 mg total) by mouth daily. 90 tablet 1   atorvastatin (LIPITOR) 40 MG tablet TAKE 1 TABLET BY MOUTH EVERY DAY 30 tablet 1   clopidogrel (PLAVIX) 75 MG tablet TAKE 1 TABLET BY MOUTH EVERY DAY WITH BREAKFAST 30 tablet 1   hydrochlorothiazide (HYDRODIURIL) 25 MG tablet Take 1 tablet (25 mg total) by mouth daily. 90 tablet 1   Multiple Vitamins-Minerals (MULTIVITAMIN WITH MINERALS) tablet Take 1 tablet by mouth daily.     sildenafil (VIAGRA) 100 MG tablet TAKE 1/2 TO 1 TABLET BY MOUTH EVERY DAY AS NEEDED FOR ERECTILE DYSFUNCTION 4 tablet 3   sitaGLIPtin (JANUVIA) 50 MG tablet Take 1 tablet (50 mg total) by mouth daily. 90 tablet 1   No current facility-administered medications on file prior to visit.    Allergies:   Allergies  Allergen Reactions   Morphine And Related Nausea And Vomiting      OBJECTIVE:  Physical Exam  Vitals:   05/05/21 1435  BP: (!) 148/92  Pulse: (!) 109  Weight: (!) 370 lb (167.8 kg)  Height: 6' (1.829 m)    Body mass index is 50.18 kg/m. No results found.  General: Morbidly obese pleasant middle-aged African-American male, seated, in no evident distress Head: head normocephalic and atraumatic.   Neck: supple with no carotid or supraclavicular bruits Cardiovascular: regular rate  and rhythm, no murmurs Musculoskeletal: no deformity Skin:  no rash/petichiae Vascular:  Normal pulses all extremities   Neurologic Exam Mental Status: Awake and fully alert. Fluent speech and language.  Oriented to place and time. Recent and remote memory intact. Attention span, concentration and fund of knowledge appropriate. Mood and affect appropriate.  Cranial Nerves: Pupils equal, briskly reactive to light. Extraocular movements full without nystagmus.  Visual fields full to confrontation. Hearing intact. Facial sensation intact. Face, tongue, palate moves normally and symmetrically.  Motor: Normal bulk and tone. Normal strength in all tested extremity muscles Sensory.: intact to touch , pinprick , position and vibratory sensation.  Coordination: Rapid alternating movements normal in all extremities. Finger-to-nose and heel-to-shin performed accurately bilaterally. Gait and Station: Arises from chair without difficulty. Stance is normal. Gait demonstrates normal stride length and balance without use of assistive device.  Ambulates without assistive device.  Able to tandem walk and heel toe without difficulty. Romberg negative.  Reflexes: 1+ and symmetric. Toes downgoing.        ASSESSMENT: Carlos Phillips is a 56 y.o. year old male presented with binocular diplopia on 03/24/2020 with stroke work-up revealing small midbrain infarct secondary to small vessel disease. Vascular risk factors include HTN, HLD, DM, hx of tobacco use, EtOH use, substance abuse (THC), morbid obesity, PVD, hx of DVT and prior strokes on imaging and severe OSA on BiPAP     PLAN:  midbrain stroke:  Greatly recovered with normal exam and denies residual deficit Continue clopidogrel 75 mg daily  and atorvastatin 40 mg daily for secondary stroke prevention.   Discussed secondary stroke prevention measures and importance of close PCP follow up for aggressive stroke risk factor management including HTN with BP  goal<130/90, HLD with LDL goal<70 and DM with A1c goal<7  Severe sleep apnea: Sleep study 06/11/2020 - severe OSA with total AHI 38.8/h and O2 nadir of 83%.  Initiated BiPAP in 01/2021 with current difficulty tolerating mask - will send order to DME company for mask refitting. Discussed whys to help dry mouth. Encouraged continued weight loss.  Discussed importance of increasing BiPAP use and ensuring at least 4 hours min per night.     Follow up in 6 months or call earlier if needed   CC:  Wendie Agreste, MD    I spent 36 minutes of face-to-face and non-face-to-face time with patient.  This included previsit chart review, lab review, study review, order entry, electronic health record documentation, patient education and discussion regarding history of prior stroke, secondary stroke prevention measures and aggressive stroke risk factor management, severe sleep apnea and initiation of BiPAP with review and discussion of compliance report and related concerns and answered all other questions to patients satisfaction    Frann Rider, AGNP-BC  [facility] [address] [place], [place] [address]  Phone [phone] Fax [phone] Note: This document was prepared with digital dictation and possible smart phrase technology. Any transcriptional errors that result from this process are unintentional.

## 2021-05-06 ENCOUNTER — Encounter: Payer: Self-pay | Admitting: Family Medicine

## 2021-05-06 ENCOUNTER — Telehealth: Payer: Self-pay

## 2021-05-06 ENCOUNTER — Ambulatory Visit: Payer: BC Managed Care – PPO | Admitting: Family Medicine

## 2021-05-06 VITALS — BP 132/92 | HR 110 | Temp 97.9°F | Resp 17 | Ht 72.0 in | Wt 370.8 lb

## 2021-05-06 DIAGNOSIS — Z23 Encounter for immunization: Secondary | ICD-10-CM | POA: Diagnosis not present

## 2021-05-06 DIAGNOSIS — G4733 Obstructive sleep apnea (adult) (pediatric): Secondary | ICD-10-CM

## 2021-05-06 DIAGNOSIS — E1159 Type 2 diabetes mellitus with other circulatory complications: Secondary | ICD-10-CM

## 2021-05-06 DIAGNOSIS — I10 Essential (primary) hypertension: Secondary | ICD-10-CM | POA: Diagnosis not present

## 2021-05-06 DIAGNOSIS — I739 Peripheral vascular disease, unspecified: Secondary | ICD-10-CM

## 2021-05-06 DIAGNOSIS — Z8673 Personal history of transient ischemic attack (TIA), and cerebral infarction without residual deficits: Secondary | ICD-10-CM

## 2021-05-06 DIAGNOSIS — E781 Pure hyperglyceridemia: Secondary | ICD-10-CM | POA: Diagnosis not present

## 2021-05-06 LAB — COMPREHENSIVE METABOLIC PANEL
ALT: 18 U/L (ref 0–53)
AST: 17 U/L (ref 0–37)
Albumin: 4.2 g/dL (ref 3.5–5.2)
Alkaline Phosphatase: 99 U/L (ref 39–117)
BUN: 18 mg/dL (ref 6–23)
CO2: 21 mEq/L (ref 19–32)
Calcium: 10 mg/dL (ref 8.4–10.5)
Chloride: 92 mEq/L — ABNORMAL LOW (ref 96–112)
Creatinine, Ser: 1.5 mg/dL (ref 0.40–1.50)
GFR: 51.77 mL/min — ABNORMAL LOW (ref >60.00)
Glucose, Bld: 608 mg/dL (ref 70–99)
Potassium: 4.5 mEq/L (ref 3.5–5.1)
Sodium: 130 mEq/L — ABNORMAL LOW (ref 135–145)
Total Bilirubin: 0.8 mg/dL (ref 0.2–1.2)
Total Protein: 7.1 g/dL (ref 6.0–8.3)

## 2021-05-06 LAB — LIPID PANEL
Cholesterol: 182 mg/dL (ref 0–200)
HDL: 49.1 mg/dL (ref >39.00)
NonHDL: 133.17
Total CHOL/HDL Ratio: 4
Triglycerides: 303 mg/dL — ABNORMAL HIGH (ref 0.0–149.0)
VLDL: 60.6 mg/dL — ABNORMAL HIGH (ref 0.0–40.0)

## 2021-05-06 LAB — LDL CHOLESTEROL, DIRECT: Direct LDL: 92 mg/dL

## 2021-05-06 LAB — HEMOGLOBIN A1C: Hgb A1c MFr Bld: 14.5 % — ABNORMAL HIGH (ref 4.6–6.5)

## 2021-05-06 MED ORDER — CLOPIDOGREL BISULFATE 75 MG PO TABS: ORAL_TABLET | ORAL | 1 refills | Status: DC

## 2021-05-06 MED ORDER — ATORVASTATIN CALCIUM 40 MG PO TABS: 40.0000 mg | ORAL_TABLET | Freq: Every day | ORAL | 1 refills | Status: DC

## 2021-05-06 MED ORDER — AMLODIPINE BESYLATE 10 MG PO TABS: 10.0000 mg | ORAL_TABLET | Freq: Every day | ORAL | 1 refills | Status: DC

## 2021-05-06 MED ORDER — LOSARTAN POTASSIUM 25 MG PO TABS: 25.0000 mg | ORAL_TABLET | Freq: Every day | ORAL | 1 refills | Status: DC

## 2021-05-06 MED ORDER — HYDROCHLOROTHIAZIDE 25 MG PO TABS: 25.0000 mg | ORAL_TABLET | Freq: Every day | ORAL | 1 refills | Status: DC

## 2021-05-06 MED ORDER — SITAGLIPTIN PHOSPHATE 50 MG PO TABS: 50.0000 mg | ORAL_TABLET | Freq: Every day | ORAL | 1 refills | Status: DC

## 2021-05-06 NOTE — Telephone Encounter (Signed)
Lab called with critical   Glucose level 608

## 2021-05-06 NOTE — Telephone Encounter (Signed)
5:52 PM Critical lab received. No acute concerns or new symptoms during office visit. Well-controlled diabetes based on A1c but high glucose in the 600s today.  Will recommend ER evaluation as may need institution of insulin, evaluation for inpatient eval if needed.  CO2 reassuring on CMP today. Called patient. No answer.  Left message that I will try to reach him again.

## 2021-05-06 NOTE — Telephone Encounter (Signed)
9:29 PM Repeat call - no answer.  Left voicemail that I will try to reach him again tomorrow morning but if any new symptoms since our visit today should be seen by medical provider.

## 2021-05-06 NOTE — Addendum Note (Signed)
Addended by: [name] R on: 05/06/2021 10:53 AM   Modules accepted: Level of Service

## 2021-05-06 NOTE — Progress Notes (Addendum)
Subjective:  Patient ID: Carlos Phillips, male    DOB: 16-Apr-1965  Age: 56 y.o. MRN: [id number]  CC:  Chief Complaint  Patient presents with   FMLA    Pt here to discuss returning to work, they need a letter stating he is fit to return to work,    Diabetes    Pt due for follow up and some recent refills have been sent in no concerns per pt, is also due for recheck on cholesterol lab work as well    Hypertension    Pt is due for refills, no concerns from pt     HPI Carlos Phillips presents for  Follow-up of above.  Last visit with me was in March 2022.  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  Neurology visit yesterday.  Stable without new stroke or TIA symptoms.  Great improvement since his previous visit with no recent episodes of dizziness or nausea.  Compliant with Plavix and atorvastatin without side effects.  Previously had dizziness and nausea with PT when discussed in May 2022.  Symptoms noted at that time especially with exercise involving eyes being closed.  Denied residual deficit with normal exam after neuro yesterday.  Continued on Plavix and atorvastatin.  Secondary stroke prevention measures. No weakness, nausea, HA or double vision. No hx of seizures.  Has not returned to work.  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 Due for DOT physical initially.   Sleep apnea Sleep study in March of last year with severe sleep apnea.  Initiated BiPAP in November.  Compliance report January 9 through February 7 with 13 out of 30 usage days for 43% compliance, average use 3 hours and 20 minutes.  Was having some issues with full facemask mask and leaks on the upper part of the mask and was contacting DME company for a nasal pillow mask to  try. 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   Diabetes: Complicated by history of CVA, PVD. Treated with Januvia 50 mg daily.  Diarrhea/fatigue with metformin.  Felt better on Januvia. Still taking daily without new side effects.  Has lost weight - walking and cutting back on meals.  Home readings:none. Symptomatic lows: no symptoms.  Microalbumin: Normal ratio 05/04/2020. Optho, foot exam, pneumovax: scheduling appt with optho.   Wt Readings from Last 3 Encounters:  05/06/21 (!) 370 lb 12.8 oz (168.2 kg)  05/05/21 (!) 370 lb (167.8 kg)  08/17/20 (!) 384 lb (174.2 kg)    Lab Results  Component Value Date   HGBA1C 6.9 (H) 06/08/2020   HGBA1C 6.4 (H) 03/25/2020   HGBA1C 6.8 (H) 04/24/2019   Lab Results  Component Value Date   MICROALBUR 0.3 06/21/2015   LDLCALC 149 (H) 06/08/2020   CREATININE 1.33 (H) 06/08/2020   Hyperlipidemia: Elevated on most recent labs in March of last year.  Treated with Lipitor 40 mg daily. No missed doses, no new myalgias.  Lab Results  Component Value Date   CHOL 235 (H) 06/08/2020   HDL 54 06/08/2020   LDLCALC 149 (H) 06/08/2020   TRIG 179 (H) 06/08/2020   CHOLHDL 4.4 06/08/2020   Lab Results  Component Value Date   ALT 18 06/08/2020   AST 15 06/08/2020   ALKPHOS 92 06/08/2020  BILITOT 0.3 06/08/2020    Hypertension: Amlodipine 10 mg daily, hydrochlorothiazide 25 mg daily.  Sleep apnea with difficulty tolerating mask as above. No missed doses of meds, no side effects.  Home readings:none.  BP Readings from Last 3 Encounters:  05/06/21 (!) 132/92  05/05/21 (!) 148/92  08/17/20 (!) 161/106   Lab Results  Component Value Date   CREATININE 1.33 (H) 06/08/2020         History Patient Active Problem List   Diagnosis Date Noted   Diplopia    Ophthalmoplegia of right eye    CVA (cerebral vascular accident) ([place]) 03/24/2020   Acute CVA (cerebrovascular accident) ([place]) 03/24/2020   Hyperlipidemia 12/31/2016   PVD (peripheral  vascular disease) ([place]) 01/29/2016   Diverticulitis 01/28/2016   Special screening for malignant neoplasms, colon    Type 2 diabetes mellitus with diabetic dermatitis, without long-term current use of insulin ([place]) 05/03/2015   History of deep vein thrombosis (DVT) of lower extremity 03/07/2013   Ischemia of extremity 02/25/2013   HTN (hypertension) 05/07/2011   Erectile dysfunction 05/07/2011   Obesity 05/07/2011   Past Medical History:  Diagnosis Date   Diabetes mellitus without complication ([place])    DVT (deep venous thrombosis) ([place]) 02/2013   Erectile dysfunction    Hyperlipidemia    Hypertension    Obesity    BMI >52   PVD (peripheral vascular disease) ([place])    Past Surgical History:  Procedure Laterality Date   ABDOMINAL AORTAGRAM N/A 02/28/2013   Procedure: ABDOMINAL Maxcine Ham;  Surgeon: Conrad Blossom, MD;  Location: [facility] CATH LAB;  Service: Cardiovascular;  Laterality: N/A;   COLONOSCOPY WITH PROPOFOL N/A 12/25/2015   Procedure: COLONOSCOPY WITH PROPOFOL;  Surgeon: Manus Gunning, MD;  Location: [facility] ENDOSCOPY;  Service: Gastroenterology;  Laterality: N/A;   EMBOLECTOMY Left 02/25/2013   Procedure: Left Popliteal EMBOLECTOMY Poss fasciotomy;  Surgeon: Elam Dutch, MD;  Location: [facility] OR;  Service: Vascular;  Laterality: Left;  Left poplital and Tibial embolectomy with four compartment Fasciotomy with vein patch angioplasty left popliteal artery.   Allergies  Allergen Reactions   Morphine And Related Nausea And Vomiting   Prior to Admission medications   Medication Sig Start Date End Date Taking? Authorizing Provider  amLODipine (NORVASC) 10 MG tablet Take 1 tablet (10 mg total) by mouth daily. 06/08/20  Yes Wendie Agreste, MD  atorvastatin (LIPITOR) 40 MG tablet TAKE 1 TABLET BY MOUTH EVERY DAY 04/18/21  Yes Wendie Agreste, MD  clopidogrel (PLAVIX) 75 MG tablet TAKE 1 TABLET BY MOUTH EVERY DAY WITH BREAKFAST 04/18/21  Yes Wendie Agreste, MD  hydrochlorothiazide  (HYDRODIURIL) 25 MG tablet Take 1 tablet (25 mg total) by mouth daily. 06/08/20  Yes Wendie Agreste, MD  Multiple Vitamins-Minerals (MULTIVITAMIN WITH MINERALS) tablet Take 1 tablet by mouth daily.   Yes [provider]  sildenafil (VIAGRA) 100 MG tablet TAKE 1/2 TO 1 TABLET BY MOUTH EVERY DAY AS NEEDED FOR ERECTILE DYSFUNCTION 06/24/20  Yes Wendie Agreste, MD  sitaGLIPtin (JANUVIA) 50 MG tablet Take 1 tablet (50 mg total) by mouth daily. 06/08/20  Yes Wendie Agreste, MD   Social History   Socioeconomic History   Marital status: Married    Spouse name: Ivin Booty   Number of children: 2   Years of education: Not on file   Highest education level: Not on file  Occupational History   Occupation: Truck driver  Tobacco Use   Smoking status: Former    Packs/day:  1.00    Years: 30.00    Pack years: 30.00    Types: Cigarettes    Quit date: 02/25/2013    Years since quitting: 8.1   Smokeless tobacco: Never  Vaping Use   Vaping Use: Never used  Substance and Sexual Activity   Alcohol use: Yes    Alcohol/week: 4.0 standard drinks    Types: 4 Standard drinks or equivalent per week    Comment: social- rare   Drug use: No   Sexual activity: Yes    Partners: Female  Other Topics Concern   Not on file  Social History Narrative   Not on file   Social Determinants of Health   Financial Resource Strain: Not on file  Food Insecurity: Not on file  Transportation Needs: Not on file  Physical Activity: Not on file  Stress: Not on file  Social Connections: Not on file  Intimate Partner Violence: Not on file    Review of Systems  Constitutional:  Negative for fatigue and unexpected weight change.  Eyes:  Negative for visual disturbance.  Respiratory:  Negative for cough, chest tightness and shortness of breath.   Cardiovascular:  Negative for chest pain, palpitations and leg swelling.  Gastrointestinal:  Negative for abdominal pain and blood in stool.  Neurological:   Negative for dizziness, light-headedness and headaches.    Objective:   Vitals:   05/06/21 0939  BP: (!) 132/92  Pulse: (!) 110  Resp: 17  Temp: 97.9 F (36.6 C)  TempSrc: Temporal  SpO2: 96%  Weight: (!) 370 lb 12.8 oz (168.2 kg)  Height: 6' (1.829 m)     Physical Exam Vitals reviewed.  Constitutional:      Appearance: He is well-developed.  HENT:     Head: Normocephalic and atraumatic.  Eyes:     Extraocular Movements: Extraocular movements intact.     Pupils: Pupils are equal, round, and reactive to light.     Comments: No nystagmus, denies diplopia with EOM testing.  Neck:     Vascular: No carotid bruit or JVD.  Cardiovascular:     Rate and Rhythm: Normal rate and regular rhythm.     Heart sounds: Normal heart sounds. No murmur heard. Pulmonary:     Effort: Pulmonary effort is normal.     Breath sounds: Normal breath sounds. No rales.  Abdominal:     General: Abdomen is flat.     Tenderness: There is no abdominal tenderness.  Musculoskeletal:     Right lower leg: No edema.     Left lower leg: No edema.  Skin:    General: Skin is warm and dry.  Neurological:     General: No focal deficit present.     Mental Status: He is alert and oriented to person, place, and time.     Comments: No focal weakness appreciated.  Psychiatric:        Mood and Affect: Mood normal.        Behavior: Behavior normal.    39minutes spent during visit, including chart review, recent neurology note review,  counseling and assimilation of information, exam, discussion of plan, possible limitations to his DOT physical and plan to improve, and chart completion.     Assessment & Plan:  Carlos Phillips is a 56 y.o. male . Controlled type 2 diabetes mellitus with other circulatory complication, without long-term current use of insulin ([place]) - Plan: Comprehensive metabolic panel, Hemoglobin A1c, sitaGLIPtin (JANUVIA) 50 MG tablet  -Tolerating Januvia, updated labs ordered.  Patient  will schedule ophthalmology visit for diabetic retinopathy screening.  A1c goal less than 7 for secondary prevention after CVA.  Hypertriglyceridemia - Plan: Comprehensive metabolic panel, Lipid panel, atorvastatin (LIPITOR) 40 MG tablet  -Tolerating statin, updated lipid panel ordered.  Goal LDL less than 70 with secondary prevention from previous CVA. Need for influenza vaccination - Plan: Flu Vaccine QUAD 6+ mos PF IM (Fluarix Quad PF)  Essential hypertension - Plan: amLODipine (NORVASC) 10 MG tablet, hydrochlorothiazide (HYDRODIURIL) 25 MG tablet, losartan (COZAAR) 25 MG tablet  -Decreased control.  More consistent CPAP/BiPAP use should help but will add losartan for now.  3-week follow-up including possible repeat creatinine at that time.  History of CVA (cerebrovascular accident) - Plan: atorvastatin (LIPITOR) 40 MG tablet  -Denies residual deficit, reassuring, nonfocal neurologic exam including no diplopia, nausea or headache.  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  Continue Plavix.  OSA treated with BiPAP  -Unfortunately some inconsistent use with mask leakage.  Has been referred for new mask fitting, adjustments to improve adherence.  Discussed improved adherence will be needed for DOT exam.  PVD (peripheral vascular disease) ([place]) - Plan: clopidogrel (PLAVIX) 75 MG tablet  -Denies any symptoms, tolerating Plavix without any bleeding, continue same.  Meds ordered this encounter  Medications   amLODipine (NORVASC) 10 MG tablet    Sig: Take 1 tablet (10 mg total) by mouth daily.    Dispense:  90 tablet    Refill:  1   hydrochlorothiazide (HYDRODIURIL) 25 MG tablet    Sig: Take 1 tablet (25 mg total) by mouth daily.    Dispense:  90 tablet    Refill:  1   sitaGLIPtin (JANUVIA) 50 MG tablet    Sig: Take 1 tablet (50 mg total) by mouth daily.    Dispense:  90 tablet    Refill:  1    losartan (COZAAR) 25 MG tablet    Sig: Take 1 tablet (25 mg total) by mouth daily.    Dispense:  90 tablet    Refill:  1   atorvastatin (LIPITOR) 40 MG tablet    Sig: Take 1 tablet (40 mg total) by mouth daily.    Dispense:  90 tablet    Refill:  1   clopidogrel (PLAVIX) 75 MG tablet    Sig: TAKE 1 TABLET BY MOUTH EVERY DAY WITH BREAKFAST    Dispense:  90 tablet    Refill:  1   Patient Instructions  If your neurologist has cleared you to return to work and they indicate that there is no known risk of seizures, then may be able to get back to driving. New mask should help with Bipap machine. You will likely need to have increased use of Bipap to pass DOT as well, but can be discussed with DOT examiner.   Blood pressure running a little too high. Add losartan once per day.  Call eye specialist for diabetes eye screening. Let me know if we can help.  Recheck in 3 weeks.     Signed,   [name], MD [place] 05/06/21 10:47 AM

## 2021-05-06 NOTE — Patient Instructions (Addendum)
If your neurologist has cleared you to return to work and they indicate that there is no known risk of seizures, then may be able to get back to driving. New mask should help with Bipap machine. You will likely need to have increased use of Bipap to pass DOT as well, but can be discussed with DOT examiner.   Blood pressure running a little too high. Add losartan once per day.  Call eye specialist for diabetes eye screening. Let me know if we can help.  Recheck in 3 weeks.

## 2021-05-07 ENCOUNTER — Ambulatory Visit: Payer: BC Managed Care – PPO | Admitting: Family Medicine

## 2021-05-07 ENCOUNTER — Encounter: Payer: Self-pay | Admitting: Family Medicine

## 2021-05-07 ENCOUNTER — Telehealth: Payer: Self-pay | Admitting: Family Medicine

## 2021-05-07 VITALS — BP 136/78 | HR 107 | Temp 97.8°F | Resp 17 | Ht 72.0 in | Wt 368.4 lb

## 2021-05-07 DIAGNOSIS — E1165 Type 2 diabetes mellitus with hyperglycemia: Secondary | ICD-10-CM | POA: Diagnosis not present

## 2021-05-07 LAB — POCT URINALYSIS DIP (MANUAL ENTRY)
Bilirubin, UA: NEGATIVE
Blood, UA: NEGATIVE
Glucose, UA: 500 mg/dL — AB
Leukocytes, UA: NEGATIVE
Nitrite, UA: NEGATIVE
Protein Ur, POC: NEGATIVE mg/dL
Spec Grav, UA: 1.02 (ref 1.010–1.025)
Urobilinogen, UA: 0.2 E.U./dL
pH, UA: 5 (ref 5.0–8.0)

## 2021-05-07 LAB — GLUCOSE, POCT (MANUAL RESULT ENTRY): POC Glucose: 534 mg/dl — AB (ref 70–99)

## 2021-05-07 MED ORDER — PEN NEEDLES 32G X 4 MM MISC: 1.0000 "application " | Freq: Every day | 3 refills | Status: DC

## 2021-05-07 MED ORDER — LANTUS SOLOSTAR 100 UNIT/ML ~~LOC~~ SOPN: 10.0000 [IU] | PEN_INJECTOR | Freq: Every day | SUBCUTANEOUS | 3 refills | Status: DC

## 2021-05-07 MED ORDER — BLOOD GLUCOSE METER KIT: PACK | 0 refills | Status: AC

## 2021-05-07 NOTE — Patient Instructions (Addendum)
Start lantus 10units per day.  Start first dose as soon as you pick up your medication this afternoon.  If you have questions about giving yourself the dose this afternoon, please return right away and we can help you with your first dose at a nurse visit.  Check blood sugar 3 times per day (fasting or 2 hours after meal) and bring record to our visit Monday.  If any new or worsening symptoms like confusion, nausea, vomiting, abdominal pain or other worsening be seen in the ER.   Hang in there.  We will get this under control and can talk about returning to work as things stabilize.   Insulin Injection Instructions, Using Insulin Pens, Adult There are many different types of insulin. The type of insulin that you take may determine how many injections you give yourself and when you need to give the injections. Supplies needed: Soap and water. Your insulin pen. A new needle. Alcohol wipes. A disposal container for sharp items (sharps container), such as an empty plastic bottle with a cover. How to choose a site for injection The body absorbs insulin differently, depending on where the insulin is injected (injection site). It is best to inject insulin into the same body area each time; for example, always in the abdomen. However, you should use a different spot in that area for each injection. Do not inject the insulin in the same spot each time. There are five main areas that can be used for injecting. These areas are: Abdomen. This is the preferred area. Front of thigh. Upper, outer side of thigh. Upper, outer side of arm. Upper, outer part of buttock. How to use an insulin pen Get ready Wash your hands with soap and water for at least 20 seconds. If soap and water are not available, use hand sanitizer. Test your blood sugar (glucose) level and write down that number. Follow any instructions from your health care provider about what to do if your blood glucose level is higher or lower than  your normal range. Make sure the insulin pen has the right kind of insulin and there is enough insulin in the pen for the dose. Check the expiration date. If you are using CLEAR insulin, check to see that it is clear and free of clumps. If you are using CLOUDY insulin, mix it by gently rolling the insulin pen between your palms several times. Do not shake the pen. Remove the cap from the insulin pen. Use an alcohol wipe to clean the rubber tip of the pen. Remove the protective paper tab from the disposable needle. Do not let the needle touch anything. Screw a new, unused needle onto the pen. Remove the outer plastic needle cover. Do not throw away the outer plastic cover yet. If the pen uses a special safety needle, leave the inner needle shield in place. If the pen does not use a special safety needle, remove the inner plastic cover from the needle. If you skip this step, you may not get the right amount of insulin. Follow the manufacturer's instructions to prime the insulin pen with the volume of insulin needed. Hold the pen with the needle pointing up, and push the button on the opposite end of the pen until a drop of insulin appears at the needle tip. If no insulin appears, repeat this step. Turn the button (dial) to the number of units of insulin that you will be injecting. Inject the insulin Use an alcohol wipe to clean the site  where you will be inserting the needle. Let the site air-dry. Grip the base of the pen with a loose fist and rest your thumb on the pen or hold the pen in the palm of your writing hand like a pencil. If directed by your health care provider, use your other hand to pinch and hold about 1 inch (2.5 cm) of skin at the injection site. Do not directly touch the cleaned part of the skin. Gently but quickly, use your writing hand to put the needle straight into the skin. Insert the needle at a 45-degree angle or a 90-degree angle (perpendicular) to the skin, as directed by  your health care provider. When the needle is completely inserted into the skin, let go of the skin that you are pinching. Use your thumb or index finger of your writing hand to push the top button of the pen all the way to inject the insulin. Continue to hold the pen in place with your writing hand. Wait 10 seconds, then pull the needle straight out of the skin. This will allow all of the insulin to go from the pen and needle into your body. Carefully put the outer plastic cover of the needle back over the needle, then unscrew the capped needle and discard it in a sharps container, such as an empty plastic bottle with a cover. Put the plastic cap back on the insulin pen. How to throw away supplies Discard all used needles in a sharps container. Follow the disposal regulations for the area where you live. Do not use any needle more than one time. Throw away empty disposable pens in the regular trash. Questions to ask your health care provider How often should I be taking insulin? How often should I check my blood glucose? What amount of insulin should I be taking each time? What are the side effects? What should I do if my blood glucose is too high? What should I do if my blood glucose is too low? What should I do if I forget to take my insulin? What number should I call if I have questions? Where to find more information American Diabetes Association (ADA): www.diabetes.org Association of Diabetes Care and Education Specialists (ADCES): www.diabeteseducator.org Summary Before you give yourself an insulin injection, be sure to wash your hands for at least 20 seconds and test your blood glucose level. Write down that number. Check the expiration date and the type of insulin that is in the pen. The type of insulin that you take may determine how many injections you give yourself and when you need to give the injections. It is best to inject insulin into the same body area each time; for  example, always in the abdomen. However, you should use a different spot in that area for each injection. Do not use a needle more than one time. This information is not intended to replace advice given to you by your health care provider. Make sure you discuss any questions you have with your health care provider. Document Revised: 06/01/2020 Document Reviewed: 01/11/2020 Elsevier Patient Education  2022 [place].    Diabetes Mellitus and Nutrition, Adult When you have diabetes, or diabetes mellitus, it is very important to have healthy eating habits because your blood sugar (glucose) levels are greatly affected by what you eat and drink. Eating healthy foods in the right amounts, at about the same times every day, can help you: Manage your blood glucose. Lower your risk of heart disease. Improve your blood  pressure. Reach or maintain a healthy weight. What can affect my meal plan? Every person with diabetes is different, and each person has different needs for a meal plan. Your health care provider may recommend that you work with a dietitian to make a meal plan that is best for you. Your meal plan may vary depending on factors such as: The calories you need. The medicines you take. Your weight. Your blood glucose, blood pressure, and cholesterol levels. Your activity level. Other health conditions you have, such as heart or kidney disease. How do carbohydrates affect me? Carbohydrates, also called carbs, affect your blood glucose level more than any other type of food. Eating carbs raises the amount of glucose in your blood. It is important to know how many carbs you can safely have in each meal. This is different for every person. Your dietitian can help you calculate how many carbs you should have at each meal and for each snack. How does alcohol affect me? Alcohol can cause a decrease in blood glucose (hypoglycemia), especially if you use insulin or take certain diabetes  medicines by mouth. Hypoglycemia can be a life-threatening condition. Symptoms of hypoglycemia, such as sleepiness, dizziness, and confusion, are similar to symptoms of having too much alcohol. Do not drink alcohol if: Your health care provider tells you not to drink. You are pregnant, may be pregnant, or are planning to become pregnant. If you drink alcohol: Limit how much you have to: 0-1 drink a day for women. 0-2 drinks a day for men. Know how much alcohol is in your drink. In the [place] one drink equals one 12 oz bottle of beer (355 mL), one 5 oz glass of wine (148 mL), or one 1 oz glass of hard liquor (44 mL). Keep yourself hydrated with water, diet soda, or unsweetened iced tea. Keep in mind that regular soda, juice, and other mixers may contain a lot of sugar and must be counted as carbs. What are tips for following this plan? Reading food labels Start by checking the serving size on the Nutrition Facts label of packaged foods and drinks. The number of calories and the amount of carbs, fats, and other nutrients listed on the label are based on one serving of the item. Many items contain more than one serving per package. Check the total grams (g) of carbs in one serving. Check the number of grams of saturated fats and trans fats in one serving. Choose foods that have a low amount or none of these fats. Check the number of milligrams (mg) of salt (sodium) in one serving. Most people should limit total sodium intake to less than 2,300 mg per day. Always check the nutrition information of foods labeled as "low-fat" or "nonfat." These foods may be higher in added sugar or refined carbs and should be avoided. Talk to your dietitian to identify your daily goals for nutrients listed on the label. Shopping Avoid buying canned, pre-made, or processed foods. These foods tend to be high in fat, sodium, and added sugar. Shop around the outside edge of the grocery store. This is where you will most  often find fresh fruits and vegetables, bulk grains, fresh meats, and fresh dairy products. Cooking Use low-heat cooking methods, such as baking, instead of high-heat cooking methods, such as deep frying. Cook using healthy oils, such as olive, canola, or sunflower oil. Avoid cooking with butter, cream, or high-fat meats. Meal planning Eat meals and snacks regularly, preferably at the same times every day.  Avoid going long periods of time without eating. Eat foods that are high in fiber, such as fresh fruits, vegetables, beans, and whole grains. Eat 4-6 oz (112-168 g) of lean protein each day, such as lean meat, chicken, fish, eggs, or tofu. One ounce (oz) (28 g) of lean protein is equal to: 1 oz (28 g) of meat, chicken, or fish. 1 egg.  cup (62 g) of tofu. Eat some foods each day that contain healthy fats, such as avocado, nuts, seeds, and fish. What foods should I eat? Fruits Berries. Apples. Oranges. Peaches. Apricots. Plums. Grapes. Mangoes. Papayas. Pomegranates. Kiwi. Cherries. Vegetables Leafy greens, including lettuce, spinach, kale, chard, collard greens, mustard greens, and cabbage. Beets. Cauliflower. Broccoli. Carrots. Green beans. Tomatoes. Peppers. Onions. Cucumbers. Brussels sprouts. Grains Whole grains, such as whole-wheat or whole-grain bread, crackers, tortillas, cereal, and pasta. Unsweetened oatmeal. Quinoa. Brown or wild rice. Meats and other proteins Seafood. Poultry without skin. Lean cuts of poultry and beef. Tofu. Nuts. Seeds. Dairy Low-fat or fat-free dairy products such as milk, yogurt, and cheese. The items listed above may not be a complete list of foods and beverages you can eat and drink. Contact a dietitian for more information. What foods should I avoid? Fruits Fruits canned with syrup. Vegetables Canned vegetables. Frozen vegetables with butter or cream sauce. Grains Refined white flour and flour products such as bread, pasta, snack foods, and  cereals. Avoid all processed foods. Meats and other proteins Fatty cuts of meat. Poultry with skin. Breaded or fried meats. Processed meat. Avoid saturated fats. Dairy Full-fat yogurt, cheese, or milk. Beverages Sweetened drinks, such as soda or iced tea. The items listed above may not be a complete list of foods and beverages you should avoid. Contact a dietitian for more information. Questions to ask a health care provider Do I need to meet with a certified diabetes care and education specialist? Do I need to meet with a dietitian? What number can I call if I have questions? When are the best times to check my blood glucose? Where to find more information: American Diabetes Association: diabetes.org Academy of Nutrition and Dietetics: eatright.[employer] of Diabetes and Digestive and Kidney Diseases: [employer] Association of Diabetes Care & Education Specialists: diabeteseducator.org Summary It is important to have healthy eating habits because your blood sugar (glucose) levels are greatly affected by what you eat and drink. It is important to use alcohol carefully. A healthy meal plan will help you manage your blood glucose and lower your risk of heart disease. Your health care provider may recommend that you work with a dietitian to make a meal plan that is best for you. This information is not intended to replace advice given to you by your health care provider. Make sure you discuss any questions you have with your health care provider. Document Revised: 10/16/2019 Document Reviewed: 10/16/2019 Elsevier Patient Education  Magazine.

## 2021-05-07 NOTE — Telephone Encounter (Signed)
Please call pt - blood sugar very high yesterday - if possible have him double booked with me at 11:40 or add at 12 to check readings and discuss meds.

## 2021-05-07 NOTE — Telephone Encounter (Signed)
Seen in office today  

## 2021-05-07 NOTE — Progress Notes (Signed)
Subjective:  Patient ID: Carlos Phillips, male    DOB: November 29, 1965  Age: 56 y.o. MRN: [id number]  CC:  Chief Complaint  Patient presents with   Diabetes    Pt lab work showed high BG pt here today to discuss changes to improve this     HPI Carlos Phillips presents for  Diabetes: With hyperglycemia, history of PVD, CVA.  Has been treated with Januvia 50 mg daily, had not seen me since March of last year and A1c was stable at that time at 6.9.  Goal A1c less than 7 with history of CVA. Unfortunately A1c significantly higher yesterday at 14.5 with significant hyperglycemia with glucose 608.  Sodium 130.  Bicarb was normal at 21.  Unable to reach patient last night, work-in visit scheduled today.  Has had some dry mouth, drinking more water, urinary frequency, more fatigue past 2 weeks - thought was from cpap machine.  No blurry vision, n/v or abdominal pain.  No headache. No confusion.  Eating and drinking ok.  No missed doses of Januvia.  Employer will not let him return until DOT physical.  New mask ordered for CPAP. Blood pressure better with new med losartan.   Lab Results  Component Value Date   HGBA1C 14.5 (H) 05/06/2021   HGBA1C 6.9 (H) 06/08/2020   HGBA1C 6.4 (H) 03/25/2020   Lab Results  Component Value Date   MICROALBUR 0.3 06/21/2015   LDLCALC 149 (H) 06/08/2020   CREATININE 1.50 05/06/2021       History Patient Active Problem List   Diagnosis Date Noted   Diplopia    Ophthalmoplegia of right eye    CVA (cerebral vascular accident) ([place]) 03/24/2020   Acute CVA (cerebrovascular accident) ([place]) 03/24/2020   Hyperlipidemia 12/31/2016   PVD (peripheral vascular disease) ([place]) 01/29/2016   Diverticulitis 01/28/2016   Special screening for malignant neoplasms, colon    Type 2 diabetes mellitus with diabetic dermatitis, without long-term current use of insulin ([place]) 05/03/2015   History of deep vein thrombosis (DVT) of lower extremity 03/07/2013   Ischemia  of extremity 02/25/2013   HTN (hypertension) 05/07/2011   Erectile dysfunction 05/07/2011   Obesity 05/07/2011   Past Medical History:  Diagnosis Date   Diabetes mellitus without complication ([place])    DVT (deep venous thrombosis) ([place]) 02/2013   Erectile dysfunction    Hyperlipidemia    Hypertension    Obesity    BMI >52   PVD (peripheral vascular disease) ([place])    Past Surgical History:  Procedure Laterality Date   ABDOMINAL AORTAGRAM N/A 02/28/2013   Procedure: ABDOMINAL Maxcine Ham;  Surgeon: Conrad Hurdland, MD;  Location: [facility] CATH LAB;  Service: Cardiovascular;  Laterality: N/A;   COLONOSCOPY WITH PROPOFOL N/A 12/25/2015   Procedure: COLONOSCOPY WITH PROPOFOL;  Surgeon: Manus Gunning, MD;  Location: [facility] ENDOSCOPY;  Service: Gastroenterology;  Laterality: N/A;   EMBOLECTOMY Left 02/25/2013   Procedure: Left Popliteal EMBOLECTOMY Poss fasciotomy;  Surgeon: Elam Dutch, MD;  Location: [facility] OR;  Service: Vascular;  Laterality: Left;  Left poplital and Tibial embolectomy with four compartment Fasciotomy with vein patch angioplasty left popliteal artery.   Allergies  Allergen Reactions   Morphine And Related Nausea And Vomiting   Prior to Admission medications   Medication Sig Start Date End Date Taking? Authorizing Provider  amLODipine (NORVASC) 10 MG tablet Take 1 tablet (10 mg total) by mouth daily. 05/06/21   Wendie Agreste, MD  atorvastatin (LIPITOR) 40 MG tablet Take  1 tablet (40 mg total) by mouth daily. 05/06/21   Wendie Agreste, MD  clopidogrel (PLAVIX) 75 MG tablet TAKE 1 TABLET BY MOUTH EVERY DAY WITH BREAKFAST 05/06/21   Wendie Agreste, MD  hydrochlorothiazide (HYDRODIURIL) 25 MG tablet Take 1 tablet (25 mg total) by mouth daily. 05/06/21   Wendie Agreste, MD  losartan (COZAAR) 25 MG tablet Take 1 tablet (25 mg total) by mouth daily. 05/06/21   Wendie Agreste, MD  Multiple Vitamins-Minerals (MULTIVITAMIN WITH MINERALS) tablet Take 1 tablet by mouth daily.     [provider]  sildenafil (VIAGRA) 100 MG tablet TAKE 1/2 TO 1 TABLET BY MOUTH EVERY DAY AS NEEDED FOR ERECTILE DYSFUNCTION 06/24/20   Wendie Agreste, MD  sitaGLIPtin (JANUVIA) 50 MG tablet Take 1 tablet (50 mg total) by mouth daily. 05/06/21   Wendie Agreste, MD   Social History   Socioeconomic History   Marital status: Married    Spouse name: Ivin Booty   Number of children: 2   Years of education: Not on file   Highest education level: Not on file  Occupational History   Occupation: Truck driver  Tobacco Use   Smoking status: Former    Packs/day: 1.00    Years: 30.00    Pack years: 30.00    Types: Cigarettes    Quit date: 02/25/2013    Years since quitting: 8.2   Smokeless tobacco: Never  Vaping Use   Vaping Use: Never used  Substance and Sexual Activity   Alcohol use: Yes    Alcohol/week: 4.0 standard drinks    Types: 4 Standard drinks or equivalent per week    Comment: social- rare   Drug use: No   Sexual activity: Yes    Partners: Female  Other Topics Concern   Not on file  Social History Narrative   Not on file   Social Determinants of Health   Financial Resource Strain: Not on file  Food Insecurity: Not on file  Transportation Needs: Not on file  Physical Activity: Not on file  Stress: Not on file  Social Connections: Not on file  Intimate Partner Violence: Not on file    Review of Systems  Per HPI.  Objective:   Vitals:   05/07/21 1149  BP: 136/78  Pulse: (!) 107  Resp: 17  Temp: 97.8 F (36.6 C)  TempSrc: Temporal  SpO2: 97%  Weight: (!) 368 lb 6.4 oz (167.1 kg)  Height: 6' (1.829 m)     Physical Exam Vitals reviewed.  Constitutional:      General: He is not in acute distress.    Appearance: He is well-developed. He is obese. He is not ill-appearing, toxic-appearing or diaphoretic.  HENT:     Head: Normocephalic and atraumatic.  Neck:     Vascular: No carotid bruit or JVD.  Cardiovascular:     Rate and Rhythm: Normal  rate and regular rhythm.     Heart sounds: Normal heart sounds. No murmur heard. Pulmonary:     Effort: Pulmonary effort is normal.     Breath sounds: Normal breath sounds. No rales.  Musculoskeletal:     Right lower leg: No edema.     Left lower leg: No edema.  Skin:    General: Skin is warm and dry.  Neurological:     Mental Status: He is alert and oriented to person, place, and time.  Psychiatric:        Mood and Affect: Mood normal.  Behavior: Behavior normal.        Thought Content: Thought content normal.      Results for orders placed or performed in visit on 05/07/21  POCT glucose (manual entry)  Result Value Ref Range   POC Glucose 534 (A) 70 - 99 mg/dl  POCT urinalysis dipstick  Result Value Ref Range   Color, UA yellow yellow   Clarity, UA clear clear   Glucose, UA =500 (A) negative mg/dL   Bilirubin, UA negative negative   Ketones, POC UA moderate (40) (A) negative mg/dL   Spec Grav, UA 1.020 1.010 - 1.025   Blood, UA negative negative   pH, UA 5.0 5.0 - 8.0   Protein Ur, POC negative negative mg/dL   Urobilinogen, UA 0.2 0.2 or 1.0 E.U./dL   Nitrite, UA Negative Negative   Leukocytes, UA Negative Negative     39 minutes spent during visit, including chart review, counseling and assimilation of information, exam, discussion of plan, and chart completion.   Assessment & Plan:  Carlos Phillips is a 56 y.o. male . Type 2 diabetes mellitus with hyperglycemia, without long-term current use of insulin ([place]) - Plan: POCT glucose (manual entry), POCT urinalysis dipstick, insulin glargine (LANTUS SOLOSTAR) 100 UNIT/ML Solostar Pen, blood glucose meter kit and supplies, Insulin Pen Needle (PEN NEEDLES) 32G X 4 MM MISC  Uncontrolled diabetes with hyperglycemia.  Only symptoms of fatigue, increased thirst, frequent urination.  Present for the past few weeks.  Likely has had progression to current level of hyperglycemia over some time.  Based on current symptoms  and exam we will initially treat outpatient but strict ER precautions were given if any worsening.  Start Lantus 10 units/day, teaching performed in office and he felt comfortable with home injection.  Option of nurse visit this afternoon if needed.  Check on readings 3 times per day and recheck in office in 3 days.  Handouts given.  ER precautions given.  Meds ordered this encounter  Medications   insulin glargine (LANTUS SOLOSTAR) 100 UNIT/ML Solostar Pen    Sig: Inject 10 Units into the skin daily.    Dispense:  3 mL    Refill:  3   blood glucose meter kit and supplies    Sig: Dispense based on patient and insurance preference. Use 3 times daily. E11.65    Dispense:  1 each    Refill:  0    Dx. E11.65    Order Specific Question:   Number of strips    Answer:   100    Order Specific Question:   Number of lancets    Answer:   100   Insulin Pen Needle (PEN NEEDLES) 32G X 4 MM MISC    Sig: 1 application by Does not apply route daily.    Dispense:  100 each    Refill:  3   Patient Instructions  Start lantus 10units per day.  Start first dose as soon as you pick up your medication this afternoon.  If you have questions about giving yourself the dose this afternoon, please return right away and we can help you with your first dose at a nurse visit.  Check blood sugar 3 times per day (fasting or 2 hours after meal) and bring record to our visit Monday.  If any new or worsening symptoms like confusion, nausea, vomiting, abdominal pain or other worsening be seen in the ER.   Hang in there.  We will get this under control and can  talk about returning to work as things stabilize.   Insulin Injection Instructions, Using Insulin Pens, Adult There are many different types of insulin. The type of insulin that you take may determine how many injections you give yourself and when you need to give the injections. Supplies needed: Soap and water. Your insulin pen. A new needle. Alcohol wipes. A  disposal container for sharp items (sharps container), such as an empty plastic bottle with a cover. How to choose a site for injection The body absorbs insulin differently, depending on where the insulin is injected (injection site). It is best to inject insulin into the same body area each time; for example, always in the abdomen. However, you should use a different spot in that area for each injection. Do not inject the insulin in the same spot each time. There are five main areas that can be used for injecting. These areas are: Abdomen. This is the preferred area. Front of thigh. Upper, outer side of thigh. Upper, outer side of arm. Upper, outer part of buttock. How to use an insulin pen Get ready Wash your hands with soap and water for at least 20 seconds. If soap and water are not available, use hand sanitizer. Test your blood sugar (glucose) level and write down that number. Follow any instructions from your health care provider about what to do if your blood glucose level is higher or lower than your normal range. Make sure the insulin pen has the right kind of insulin and there is enough insulin in the pen for the dose. Check the expiration date. If you are using CLEAR insulin, check to see that it is clear and free of clumps. If you are using CLOUDY insulin, mix it by gently rolling the insulin pen between your palms several times. Do not shake the pen. Remove the cap from the insulin pen. Use an alcohol wipe to clean the rubber tip of the pen. Remove the protective paper tab from the disposable needle. Do not let the needle touch anything. Screw a new, unused needle onto the pen. Remove the outer plastic needle cover. Do not throw away the outer plastic cover yet. If the pen uses a special safety needle, leave the inner needle shield in place. If the pen does not use a special safety needle, remove the inner plastic cover from the needle. If you skip this step, you may not get the  right amount of insulin. Follow the manufacturer's instructions to prime the insulin pen with the volume of insulin needed. Hold the pen with the needle pointing up, and push the button on the opposite end of the pen until a drop of insulin appears at the needle tip. If no insulin appears, repeat this step. Turn the button (dial) to the number of units of insulin that you will be injecting. Inject the insulin Use an alcohol wipe to clean the site where you will be inserting the needle. Let the site air-dry. Grip the base of the pen with a loose fist and rest your thumb on the pen or hold the pen in the palm of your writing hand like a pencil. If directed by your health care provider, use your other hand to pinch and hold about 1 inch (2.5 cm) of skin at the injection site. Do not directly touch the cleaned part of the skin. Gently but quickly, use your writing hand to put the needle straight into the skin. Insert the needle at a 45-degree angle or a 90-degree  angle (perpendicular) to the skin, as directed by your health care provider. When the needle is completely inserted into the skin, let go of the skin that you are pinching. Use your thumb or index finger of your writing hand to push the top button of the pen all the way to inject the insulin. Continue to hold the pen in place with your writing hand. Wait 10 seconds, then pull the needle straight out of the skin. This will allow all of the insulin to go from the pen and needle into your body. Carefully put the outer plastic cover of the needle back over the needle, then unscrew the capped needle and discard it in a sharps container, such as an empty plastic bottle with a cover. Put the plastic cap back on the insulin pen. How to throw away supplies Discard all used needles in a sharps container. Follow the disposal regulations for the area where you live. Do not use any needle more than one time. Throw away empty disposable pens in the regular  trash. Questions to ask your health care provider How often should I be taking insulin? How often should I check my blood glucose? What amount of insulin should I be taking each time? What are the side effects? What should I do if my blood glucose is too high? What should I do if my blood glucose is too low? What should I do if I forget to take my insulin? What number should I call if I have questions? Where to find more information American Diabetes Association (ADA): www.diabetes.org Association of Diabetes Care and Education Specialists (ADCES): www.diabeteseducator.org Summary Before you give yourself an insulin injection, be sure to wash your hands for at least 20 seconds and test your blood glucose level. Write down that number. Check the expiration date and the type of insulin that is in the pen. The type of insulin that you take may determine how many injections you give yourself and when you need to give the injections. It is best to inject insulin into the same body area each time; for example, always in the abdomen. However, you should use a different spot in that area for each injection. Do not use a needle more than one time. This information is not intended to replace advice given to you by your health care provider. Make sure you discuss any questions you have with your health care provider. Document Revised: 06/01/2020 Document Reviewed: 01/11/2020 Elsevier Patient Education  2022 [place].    Diabetes Mellitus and Nutrition, Adult When you have diabetes, or diabetes mellitus, it is very important to have healthy eating habits because your blood sugar (glucose) levels are greatly affected by what you eat and drink. Eating healthy foods in the right amounts, at about the same times every day, can help you: Manage your blood glucose. Lower your risk of heart disease. Improve your blood pressure. Reach or maintain a healthy weight. What can affect my meal plan? Every  person with diabetes is different, and each person has different needs for a meal plan. Your health care provider may recommend that you work with a dietitian to make a meal plan that is best for you. Your meal plan may vary depending on factors such as: The calories you need. The medicines you take. Your weight. Your blood glucose, blood pressure, and cholesterol levels. Your activity level. Other health conditions you have, such as heart or kidney disease. How do carbohydrates affect me? Carbohydrates, also called carbs, affect  your blood glucose level more than any other type of food. Eating carbs raises the amount of glucose in your blood. It is important to know how many carbs you can safely have in each meal. This is different for every person. Your dietitian can help you calculate how many carbs you should have at each meal and for each snack. How does alcohol affect me? Alcohol can cause a decrease in blood glucose (hypoglycemia), especially if you use insulin or take certain diabetes medicines by mouth. Hypoglycemia can be a life-threatening condition. Symptoms of hypoglycemia, such as sleepiness, dizziness, and confusion, are similar to symptoms of having too much alcohol. Do not drink alcohol if: Your health care provider tells you not to drink. You are pregnant, may be pregnant, or are planning to become pregnant. If you drink alcohol: Limit how much you have to: 0-1 drink a day for women. 0-2 drinks a day for men. Know how much alcohol is in your drink. In the [place] one drink equals one 12 oz bottle of beer (355 mL), one 5 oz glass of wine (148 mL), or one 1 oz glass of hard liquor (44 mL). Keep yourself hydrated with water, diet soda, or unsweetened iced tea. Keep in mind that regular soda, juice, and other mixers may contain a lot of sugar and must be counted as carbs. What are tips for following this plan? Reading food labels Start by checking the serving size on the Nutrition  Facts label of packaged foods and drinks. The number of calories and the amount of carbs, fats, and other nutrients listed on the label are based on one serving of the item. Many items contain more than one serving per package. Check the total grams (g) of carbs in one serving. Check the number of grams of saturated fats and trans fats in one serving. Choose foods that have a low amount or none of these fats. Check the number of milligrams (mg) of salt (sodium) in one serving. Most people should limit total sodium intake to less than 2,300 mg per day. Always check the nutrition information of foods labeled as "low-fat" or "nonfat." These foods may be higher in added sugar or refined carbs and should be avoided. Talk to your dietitian to identify your daily goals for nutrients listed on the label. Shopping Avoid buying canned, pre-made, or processed foods. These foods tend to be high in fat, sodium, and added sugar. Shop around the outside edge of the grocery store. This is where you will most often find fresh fruits and vegetables, bulk grains, fresh meats, and fresh dairy products. Cooking Use low-heat cooking methods, such as baking, instead of high-heat cooking methods, such as deep frying. Cook using healthy oils, such as olive, canola, or sunflower oil. Avoid cooking with butter, cream, or high-fat meats. Meal planning Eat meals and snacks regularly, preferably at the same times every day. Avoid going long periods of time without eating. Eat foods that are high in fiber, such as fresh fruits, vegetables, beans, and whole grains. Eat 4-6 oz (112-168 g) of lean protein each day, such as lean meat, chicken, fish, eggs, or tofu. One ounce (oz) (28 g) of lean protein is equal to: 1 oz (28 g) of meat, chicken, or fish. 1 egg.  cup (62 g) of tofu. Eat some foods each day that contain healthy fats, such as avocado, nuts, seeds, and fish. What foods should I eat? Fruits Berries. Apples. Oranges.  Peaches. Apricots. Plums. Grapes. Mangoes. Papayas. Pomegranates.  Kiwi. Cherries. Vegetables Leafy greens, including lettuce, spinach, kale, chard, collard greens, mustard greens, and cabbage. Beets. Cauliflower. Broccoli. Carrots. Green beans. Tomatoes. Peppers. Onions. Cucumbers. Brussels sprouts. Grains Whole grains, such as whole-wheat or whole-grain bread, crackers, tortillas, cereal, and pasta. Unsweetened oatmeal. Quinoa. Brown or wild rice. Meats and other proteins Seafood. Poultry without skin. Lean cuts of poultry and beef. Tofu. Nuts. Seeds. Dairy Low-fat or fat-free dairy products such as milk, yogurt, and cheese. The items listed above may not be a complete list of foods and beverages you can eat and drink. Contact a dietitian for more information. What foods should I avoid? Fruits Fruits canned with syrup. Vegetables Canned vegetables. Frozen vegetables with butter or cream sauce. Grains Refined white flour and flour products such as bread, pasta, snack foods, and cereals. Avoid all processed foods. Meats and other proteins Fatty cuts of meat. Poultry with skin. Breaded or fried meats. Processed meat. Avoid saturated fats. Dairy Full-fat yogurt, cheese, or milk. Beverages Sweetened drinks, such as soda or iced tea. The items listed above may not be a complete list of foods and beverages you should avoid. Contact a dietitian for more information. Questions to ask a health care provider Do I need to meet with a certified diabetes care and education specialist? Do I need to meet with a dietitian? What number can I call if I have questions? When are the best times to check my blood glucose? Where to find more information: American Diabetes Association: diabetes.org Academy of Nutrition and Dietetics: eatright.[employer] of Diabetes and Digestive and Kidney Diseases: [employer] Association of Diabetes Care & Education Specialists:  diabeteseducator.org Summary It is important to have healthy eating habits because your blood sugar (glucose) levels are greatly affected by what you eat and drink. It is important to use alcohol carefully. A healthy meal plan will help you manage your blood glucose and lower your risk of heart disease. Your health care provider may recommend that you work with a dietitian to make a meal plan that is best for you. This information is not intended to replace advice given to you by your health care provider. Make sure you discuss any questions you have with your health care provider. Document Revised: 10/16/2019 Document Reviewed: 10/16/2019 Elsevier Patient Education  2022 [place],   [name], MD [address], [facility] 05/07/21 1:23 PM

## 2021-05-10 ENCOUNTER — Ambulatory Visit: Payer: BC Managed Care – PPO | Admitting: Family Medicine

## 2021-05-10 VITALS — BP 134/78 | HR 108 | Temp 98.0°F | Resp 17 | Ht 72.0 in | Wt 369.2 lb

## 2021-05-10 DIAGNOSIS — E1165 Type 2 diabetes mellitus with hyperglycemia: Secondary | ICD-10-CM

## 2021-05-10 LAB — GLUCOSE, POCT (MANUAL RESULT ENTRY): POC Glucose: 291 mg/dl — AB (ref 70–99)

## 2021-05-10 NOTE — Progress Notes (Signed)
Subjective:  Patient ID: Carlos Phillips, male    DOB: 04-24-1965  Age: 56 y.o. MRN: [id number]  CC:  Chief Complaint  Patient presents with   Diabetes    Pt here for follow up today, pt did well with injection and is having better at home sugars most recent being 297 this morning     HPI Carlos Phillips presents for   Diabetes: With hyperglycemia, uncontrolled. Started on lantus solostar 3 days ago - 10 units per day s/p 4 doses.  Still on januvia $RemoveBe'50mg'[id number]$  per day. GI intolerance with metformin.  No n/v/abd pain.  Home readings:  509, 339, 388, 381, 311, 297, 591 (after meal - in evening),299, 297.   Lab Results  Component Value Date   HGBA1C 14.5 (H) 05/06/2021   HGBA1C 6.9 (H) 06/08/2020   HGBA1C 6.4 (H) 03/25/2020   Lab Results  Component Value Date   MICROALBUR 0.3 06/21/2015   LDLCALC 149 (H) 06/08/2020   CREATININE 1.50 05/06/2021    History Patient Active Problem List   Diagnosis Date Noted   Diplopia    Ophthalmoplegia of right eye    CVA (cerebral vascular accident) ([place]) 03/24/2020   Acute CVA (cerebrovascular accident) ([place]) 03/24/2020   Hyperlipidemia 12/31/2016   PVD (peripheral vascular disease) ([place]) 01/29/2016   Diverticulitis 01/28/2016   Special screening for malignant neoplasms, colon    Type 2 diabetes mellitus with diabetic dermatitis, without long-term current use of insulin ([place]) 05/03/2015   History of deep vein thrombosis (DVT) of lower extremity 03/07/2013   Ischemia of extremity 02/25/2013   HTN (hypertension) 05/07/2011   Erectile dysfunction 05/07/2011   Obesity 05/07/2011   Past Medical History:  Diagnosis Date   Diabetes mellitus without complication ([place])    DVT (deep venous thrombosis) ([place]) 02/2013   Erectile dysfunction    Hyperlipidemia    Hypertension    Obesity    BMI >52   PVD (peripheral vascular disease) ([place])    Past Surgical History:  Procedure Laterality Date   ABDOMINAL AORTAGRAM N/A 02/28/2013    Procedure: ABDOMINAL Maxcine Ham;  Surgeon: Conrad Roberts, MD;  Location: [facility] CATH LAB;  Service: Cardiovascular;  Laterality: N/A;   COLONOSCOPY WITH PROPOFOL N/A 12/25/2015   Procedure: COLONOSCOPY WITH PROPOFOL;  Surgeon: Manus Gunning, MD;  Location: [facility] ENDOSCOPY;  Service: Gastroenterology;  Laterality: N/A;   EMBOLECTOMY Left 02/25/2013   Procedure: Left Popliteal EMBOLECTOMY Poss fasciotomy;  Surgeon: Elam Dutch, MD;  Location: [facility] OR;  Service: Vascular;  Laterality: Left;  Left poplital and Tibial embolectomy with four compartment Fasciotomy with vein patch angioplasty left popliteal artery.   Allergies  Allergen Reactions   Morphine And Related Nausea And Vomiting   Prior to Admission medications   Medication Sig Start Date End Date Taking? Authorizing Provider  amLODipine (NORVASC) 10 MG tablet Take 1 tablet (10 mg total) by mouth daily. 05/06/21  Yes Wendie Agreste, MD  atorvastatin (LIPITOR) 40 MG tablet Take 1 tablet (40 mg total) by mouth daily. 05/06/21  Yes Wendie Agreste, MD  blood glucose meter kit and supplies Dispense based on patient and insurance preference. Use 3 times daily. E11.65 05/07/21  Yes Wendie Agreste, MD  clopidogrel (PLAVIX) 75 MG tablet TAKE 1 TABLET BY MOUTH EVERY DAY WITH BREAKFAST 05/06/21  Yes Wendie Agreste, MD  hydrochlorothiazide (HYDRODIURIL) 25 MG tablet Take 1 tablet (25 mg total) by mouth daily. 05/06/21  Yes Wendie Agreste, MD  insulin glargine (  LANTUS SOLOSTAR) 100 UNIT/ML Solostar Pen Inject 10 Units into the skin daily. 05/07/21  Yes Wendie Agreste, MD  Insulin Pen Needle (PEN NEEDLES) 32G X 4 MM MISC 1 application by Does not apply route daily. 05/07/21  Yes Wendie Agreste, MD  losartan (COZAAR) 25 MG tablet Take 1 tablet (25 mg total) by mouth daily. 05/06/21  Yes Wendie Agreste, MD  Multiple Vitamins-Minerals (MULTIVITAMIN WITH MINERALS) tablet Take 1 tablet by mouth daily.   Yes [provider]  sildenafil  (VIAGRA) 100 MG tablet TAKE 1/2 TO 1 TABLET BY MOUTH EVERY DAY AS NEEDED FOR ERECTILE DYSFUNCTION 06/24/20  Yes Wendie Agreste, MD  sitaGLIPtin (JANUVIA) 50 MG tablet Take 1 tablet (50 mg total) by mouth daily. 05/06/21  Yes Wendie Agreste, MD   Social History   Socioeconomic History   Marital status: Married    Spouse name: Ivin Booty   Number of children: 2   Years of education: Not on file   Highest education level: Not on file  Occupational History   Occupation: Truck driver  Tobacco Use   Smoking status: Former    Packs/day: 1.00    Years: 30.00    Pack years: 30.00    Types: Cigarettes    Quit date: 02/25/2013    Years since quitting: 8.2   Smokeless tobacco: Never  Vaping Use   Vaping Use: Never used  Substance and Sexual Activity   Alcohol use: Yes    Alcohol/week: 4.0 standard drinks    Types: 4 Standard drinks or equivalent per week    Comment: social- rare   Drug use: No   Sexual activity: Yes    Partners: Female  Other Topics Concern   Not on file  Social History Narrative   Not on file   Social Determinants of Health   Financial Resource Strain: Not on file  Food Insecurity: Not on file  Transportation Needs: Not on file  Physical Activity: Not on file  Stress: Not on file  Social Connections: Not on file  Intimate Partner Violence: Not on file    Review of Systems  Per HPI.  Objective:   Vitals:   05/10/21 1620  BP: 134/78  Pulse: (!) 108  Resp: 17  Temp: 98 F (36.7 C)  TempSrc: Temporal  SpO2: 95%  Weight: (!) 369 lb 3.2 oz (167.5 kg)  Height: 6' (1.829 m)     Physical Exam Vitals reviewed.  Constitutional:      Appearance: He is well-developed.  HENT:     Head: Normocephalic and atraumatic.  Neck:     Vascular: No carotid bruit or JVD.  Cardiovascular:     Rate and Rhythm: Regular rhythm. Tachycardia present.     Heart sounds: Normal heart sounds. No murmur heard.    Comments: Rate approximately 100. Pulmonary:     Effort:  Pulmonary effort is normal.     Breath sounds: Normal breath sounds. No rales.  Musculoskeletal:     Right lower leg: No edema.     Left lower leg: No edema.  Skin:    General: Skin is warm and dry.  Neurological:     Mental Status: He is alert and oriented to person, place, and time.  Psychiatric:        Mood and Affect: Mood normal.     Results for orders placed or performed in visit on 05/10/21  POCT glucose (manual entry)  Result Value Ref Range   POC Glucose 291 (  A) 70 - 99 mg/dl     Assessment & Plan:  Carlos Phillips is a 57 y.o. male . Type 2 diabetes mellitus with hyperglycemia, without long-term current use of insulin (HCC) - Plan: POCT glucose (manual entry)  -Complicated by hyperglycemia as noted last visit.  Home readings have been improving.  Denies nausea/vomiting/abdominal pain or new symptoms that would be concerning for hyperosmolar state or DKA.  We will continue outpatient treatment.  Increase Lantus to 12 units each day with close monitoring then 2 additional units for 14 units/day total in 3 days if readings remain above 200.  Recheck in 1 week.  No other changes for now, RTC/ER precautions.  No orders of the defined types were placed in this encounter.  Patient Instructions  Increase lantus insulin to 12 units tomorrow and stay on that dose each day. Check blood sugars 2-3 times per day.  If still above 200's on Thursday, increase to 14 units per day starting Friday mornings.  Recheck in 1 week to decide on further changes.      Signed,   [name], MD [place] 05/10/21 5:35 PM

## 2021-05-10 NOTE — Patient Instructions (Signed)
Increase lantus insulin to 12 units tomorrow and stay on that dose each day. Check blood sugars 2-3 times per day.  If still above 200's on Thursday, increase to 14 units per day starting Friday mornings.  Recheck in 1 week to decide on further changes.

## 2021-05-11 ENCOUNTER — Encounter: Payer: Self-pay | Admitting: Family Medicine

## 2021-05-19 ENCOUNTER — Ambulatory Visit: Payer: BC Managed Care – PPO | Admitting: Family Medicine

## 2021-05-19 ENCOUNTER — Encounter: Payer: Self-pay | Admitting: Family Medicine

## 2021-05-19 VITALS — BP 126/80 | HR 89 | Temp 97.6°F | Resp 16 | Ht 72.0 in | Wt 372.0 lb

## 2021-05-19 DIAGNOSIS — E1165 Type 2 diabetes mellitus with hyperglycemia: Secondary | ICD-10-CM

## 2021-05-19 DIAGNOSIS — G4731 Primary central sleep apnea: Secondary | ICD-10-CM | POA: Diagnosis not present

## 2021-05-19 NOTE — Patient Instructions (Addendum)
Increase to 18 units per day, then in 3 days if readings are not under 200, increase to 20 units.  Follow up in 1 week.  Return to the clinic or go to the nearest emergency room if any of your symptoms worsen or new symptoms occur.

## 2021-05-19 NOTE — Progress Notes (Signed)
Subjective:  Patient ID: Carlos Phillips, male    DOB: 1965-07-30  Age: 56 y.o. MRN: [id number]  CC:  Chief Complaint  Patient presents with   Diabetes    Pt here for 1 week recheck on high BG has brought readings again today, no concerns from pt he reports he is feeling well     HPI Carlos Phillips presents for  Type 2 diabetes with hyperglycemia. See see and in office glucose at his February 9 visit.  Prior visits.  Marked increase in A1c in office glucose on February 9.  Started on Lantus February 10. Initially 10 units/day.  Increase to 12 units at his follow-up visit on the 13th, with option of additional 2 units if persistent elevated readings after a few days.   GI intolerance with metformin in the past, continued on Januvia. Feeling ok. No n/v/abd pain or blurry vision.  Current dose of insulin 14 units past 5 days.  Home readings: 257-434 (after fruit).   No symptomatic lows.  Still out of work - had not returned since prior CVA. Planned returned to work until elevated blood sugar discovered.  No CP, no diplopia or vision changes, no focal weakness.        History Patient Active Problem List   Diagnosis Date Noted   Diplopia    Ophthalmoplegia of right eye    CVA (cerebral vascular accident) ([place]) 03/24/2020   Acute CVA (cerebrovascular accident) ([place]) 03/24/2020   Hyperlipidemia 12/31/2016   PVD (peripheral vascular disease) ([place]) 01/29/2016   Diverticulitis 01/28/2016   Special screening for malignant neoplasms, colon    Type 2 diabetes mellitus with diabetic dermatitis, without long-term current use of insulin ([place]) 05/03/2015   History of deep vein thrombosis (DVT) of lower extremity 03/07/2013   Ischemia of extremity 02/25/2013   HTN (hypertension) 05/07/2011   Erectile dysfunction 05/07/2011   Obesity 05/07/2011   Past Medical History:  Diagnosis Date   Diabetes mellitus without complication ([place])    DVT (deep venous thrombosis) ([place]) 02/2013    Erectile dysfunction    Hyperlipidemia    Hypertension    Obesity    BMI >52   PVD (peripheral vascular disease) ([place])    Past Surgical History:  Procedure Laterality Date   ABDOMINAL AORTAGRAM N/A 02/28/2013   Procedure: ABDOMINAL Maxcine Ham;  Surgeon: Conrad [place], MD;  Location: [facility] CATH LAB;  Service: Cardiovascular;  Laterality: N/A;   COLONOSCOPY WITH PROPOFOL N/A 12/25/2015   Procedure: COLONOSCOPY WITH PROPOFOL;  Surgeon: Manus Gunning, MD;  Location: [facility] ENDOSCOPY;  Service: Gastroenterology;  Laterality: N/A;   EMBOLECTOMY Left 02/25/2013   Procedure: Left Popliteal EMBOLECTOMY Poss fasciotomy;  Surgeon: Elam Dutch, MD;  Location: [facility] OR;  Service: Vascular;  Laterality: Left;  Left poplital and Tibial embolectomy with four compartment Fasciotomy with vein patch angioplasty left popliteal artery.   Allergies  Allergen Reactions   Morphine And Related Nausea And Vomiting   Prior to Admission medications   Medication Sig Start Date End Date Taking? Authorizing Provider  amLODipine (NORVASC) 10 MG tablet Take 1 tablet (10 mg total) by mouth daily. 05/06/21   Wendie Agreste, MD  atorvastatin (LIPITOR) 40 MG tablet Take 1 tablet (40 mg total) by mouth daily. 05/06/21   Wendie Agreste, MD  blood glucose meter kit and supplies Dispense based on patient and insurance preference. Use 3 times daily. E11.65 05/07/21   Wendie Agreste, MD  clopidogrel (PLAVIX) 75 MG tablet TAKE  1 TABLET BY MOUTH EVERY DAY WITH BREAKFAST 05/06/21   Wendie Agreste, MD  hydrochlorothiazide (HYDRODIURIL) 25 MG tablet Take 1 tablet (25 mg total) by mouth daily. 05/06/21   Wendie Agreste, MD  insulin glargine (LANTUS SOLOSTAR) 100 UNIT/ML Solostar Pen Inject 10 Units into the skin daily. 05/07/21   Wendie Agreste, MD  Insulin Pen Needle (PEN NEEDLES) 32G X 4 MM MISC 1 application by Does not apply route daily. 05/07/21   Wendie Agreste, MD  losartan (COZAAR) 25 MG tablet Take 1 tablet (25 mg  total) by mouth daily. 05/06/21   Wendie Agreste, MD  Multiple Vitamins-Minerals (MULTIVITAMIN WITH MINERALS) tablet Take 1 tablet by mouth daily.    [provider]  sildenafil (VIAGRA) 100 MG tablet TAKE 1/2 TO 1 TABLET BY MOUTH EVERY DAY AS NEEDED FOR ERECTILE DYSFUNCTION 06/24/20   Wendie Agreste, MD  sitaGLIPtin (JANUVIA) 50 MG tablet Take 1 tablet (50 mg total) by mouth daily. 05/06/21   Wendie Agreste, MD   Social History   Socioeconomic History   Marital status: Married    Spouse name: Ivin Booty   Number of children: 2   Years of education: Not on file   Highest education level: Not on file  Occupational History   Occupation: Truck driver  Tobacco Use   Smoking status: Former    Packs/day: 1.00    Years: 30.00    Pack years: 30.00    Types: Cigarettes    Quit date: 02/25/2013    Years since quitting: 8.2   Smokeless tobacco: Never  Vaping Use   Vaping Use: Never used  Substance and Sexual Activity   Alcohol use: Yes    Alcohol/week: 4.0 standard drinks    Types: 4 Standard drinks or equivalent per week    Comment: social- rare   Drug use: No   Sexual activity: Yes    Partners: Female  Other Topics Concern   Not on file  Social History Narrative   Not on file   Social Determinants of Health   Financial Resource Strain: Not on file  Food Insecurity: Not on file  Transportation Needs: Not on file  Physical Activity: Not on file  Stress: Not on file  Social Connections: Not on file  Intimate Partner Violence: Not on file    Review of Systems  Per HPI  Objective:   Vitals:   05/19/21 1042  BP: 126/80  Pulse: 89  Resp: 16  Temp: 97.6 F (36.4 C)  TempSrc: Temporal  SpO2: 97%  Weight: (!) 372 lb (168.7 kg)  Height: 6' (1.829 m)    Physical Exam Vitals reviewed.  Constitutional:      Appearance: He is well-developed.  HENT:     Head: Normocephalic and atraumatic.  Neck:     Vascular: No carotid bruit or JVD.  Cardiovascular:      Rate and Rhythm: Normal rate and regular rhythm.     Heart sounds: Normal heart sounds. No murmur heard. Pulmonary:     Effort: Pulmonary effort is normal.     Breath sounds: Normal breath sounds. No rales.  Musculoskeletal:     Right lower leg: No edema.     Left lower leg: No edema.  Skin:    General: Skin is warm and dry.  Neurological:     Mental Status: He is alert and oriented to person, place, and time.  Psychiatric:        Mood and Affect: Mood  normal.    Assessment & Plan:  JAXEN SAMPLES is a 56 y.o. male . Type 2 diabetes mellitus with hyperglycemia, without long-term current use of insulin ([place]) - Plan: Amb ref to Medical Nutrition Therapy-MNT  -Still uncontrolled.  Increase Lantus insulin to 18 units for now with repeat exam in 1 week.  If still elevated readings next few days can increase on his own to 20 units.  Refer to medical nutrition therapy for classes, nutritional counseling.  We will look at his paperwork from work, and determine potential return to work date.  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  No orders of the defined types were placed in this encounter.  Patient Instructions  Increase to 18 units per day, then in 3 days if readings are not under 200, increase to 20 units.  Follow up in 1 week.  Return to the clinic or go to the nearest emergency room if any of your symptoms worsen or new symptoms occur.     Signed,   [name], MD [place] 05/19/21 11:24 AM

## 2021-05-26 ENCOUNTER — Encounter: Payer: Self-pay | Admitting: Family Medicine

## 2021-05-26 ENCOUNTER — Ambulatory Visit: Payer: BC Managed Care – PPO | Admitting: Family Medicine

## 2021-05-26 VITALS — BP 136/78 | HR 63 | Temp 97.7°F | Resp 17 | Ht 72.0 in | Wt 379.8 lb

## 2021-05-26 DIAGNOSIS — E1165 Type 2 diabetes mellitus with hyperglycemia: Secondary | ICD-10-CM

## 2021-05-26 LAB — GLUCOSE, POCT (MANUAL RESULT ENTRY): POC Glucose: 237 mg/dl — AB (ref 70–99)

## 2021-05-26 MED ORDER — SITAGLIPTIN PHOSPHATE 100 MG PO TABS: 100.0000 mg | ORAL_TABLET | Freq: Every day | ORAL | 1 refills | Status: DC

## 2021-05-26 NOTE — Patient Instructions (Addendum)
Increase januvia to 100mg  per day.  Stay at 20 units insulin for now.  If readings over 250 call and we can discuss other changes.  Do not skip meals since you are taking insulin.  If any low blood sugars let me know right away. Recheck in 2 weeks.    Insulin Glargine Injection What is this medication? INSULIN GLARGINE (IN su lin GLAR geen) treats diabetes. It works by increasing insulin levels in your body, which decreases your blood sugar (glucose). It belongs to a group of medications called long-acting insulins or basal insulins. Changes to diet and exercise are often combined with this medication. This medicine may be used for other purposes; ask your health care provider or pharmacist if you have questions. COMMON BRAND NAME(S): BASAGLAR, Lantus, Lantus SoloStar, Semglee, Toujeo Max SoloStar, [employer] What should I tell my care team before I take this medication? They need to know if you have any of these conditions: Episodes of low blood sugar Eye disease, vision problems Kidney disease Liver disease An unusual or allergic reaction to insulin, metacresol, other medications, foods, dyes, or preservatives Pregnant or trying to get pregnant Breast-feeding How should I use this medication? This medication is for injection under the skin. Use this medication at the same time each day. Use exactly as directed. This insulin should never be mixed in the same syringe with other insulins before injection. Do not vigorously shake before use. You will be taught how to use this medication and how to adjust doses for activities and illness. Do not use more insulin than prescribed. Always check the appearance of your insulin before using it. This medication should be clear and colorless like water. Do not use it if it is cloudy, thickened, colored, or has solid particles in it. If you use an insulin pen, be sure to take off the outer needle cover before using the dose. It is important that  you put your used needles and syringes in a special sharps container. Do not put them in a trash can. If you do not have a sharps container, call your pharmacist or care team to get one. This medication comes with INSTRUCTIONS FOR USE. Ask your pharmacist for directions on how to use this medication. Read the information carefully. Talk to your pharmacist or care team if you have questions. Talk to your care team regarding the use of this medication in children. While this medication may be prescribed for children as young as 6 years for selected conditions, precautions do apply. Overdosage: If you think you have taken too much of this medicine contact a poison control center or emergency room at once. NOTE: This medicine is only for you. Do not share this medicine with others. What if I miss a dose? It is important not to miss a dose. Your care team should discuss a plan for missed doses with you. If you do miss a dose, follow their plan. Do not take double doses. What may interact with this medication? Alcohol containing beverages Antiviral medications for HIV or AIDS Aspirin and aspirin-like medications Beta-blockers like atenolol, metoprolol, propranolol Certain medications for blood pressure, heart disease, irregular heart beat Chromium Clonidine Diuretics Male hormones, such as estrogens or progestins, birth control pills Fenofibrate Gemfibrozil Guanethidine Isoniazid Lanreotide Male hormones or anabolic steroids MAOIs like Carbex, Eldepryl, Marplan, Nardil, and Parnate Medications for weight loss Medications for allergies, asthma, cold, or cough Medications for depression, anxiety, or psychotic disturbances Niacin Nicotine NSAIDs, medications for pain and inflammation, like  ibuprofen or naproxen Octreotide Other medications for diabetes, like glyburide, glipizide, or glimepiride Pasireotide Pentamidine Phenytoin Probenecid Quinolone antibiotics such as ciprofloxacin,  levofloxacin, ofloxacin Reserpine Some herbal dietary supplements Steroid medications such as prednisone or cortisone Sulfamethoxazole; trimethoprim Thyroid hormones This list may not describe all possible interactions. Give your health care provider a list of all the medicines, herbs, non-prescription drugs, or dietary supplements you use. Also tell them if you smoke, drink alcohol, or use illegal drugs. Some items may interact with your medicine. What should I watch for while using this medication? Visit your care team for regular checks on your progress. Do not drive, use machinery, or do anything that needs mental alertness until you know how this medication affects you. Alcohol may interfere with the effect of this medication. Avoid alcoholic drinks. A test called the HbA1C (A1C) will be monitored. This is a simple blood test. It measures your blood sugar control over the last 2 to 3 months. You will receive this test every 3 to 6 months. Learn how to check your blood sugar. Learn the symptoms of low and high blood sugar and how to manage them. Always carry a quick-source of sugar with you in case you have symptoms of low blood sugar. Examples include hard sugar candy or glucose tablets. Make sure others know that you can choke if you eat or drink when you develop serious symptoms of low blood sugar, such as seizures or unconsciousness. They must get medical help at once. Tell your care team if you have high blood sugar. You might need to change the dose of your medication. If you are sick or exercising more than usual, you might need to change the dose of your medication. Do not skip meals. Ask your care team if you should avoid alcohol. Many nonprescription cough and cold products contain sugar or alcohol. These can affect blood sugar. Make sure that you have the right kind of syringe for the type of insulin you use. Try not to change the brand and type of insulin or syringe unless your care  team tells you to. Switching insulin brand or type can cause dangerously high or low blood sugar. Always keep an extra supply of insulin, syringes, and needles on hand. Use a syringe one time only. Throw away syringe and needle in a closed container to prevent accidental needle sticks. Insulin pens and cartridges should never be shared. Even if the needle is changed, sharing may result in passing of viruses like hepatitis or HIV. Each time you get a new box of pen needles, check to see if they are the same type as the ones you were trained to use. If not, ask your care team to show you how to use this new type properly. Wear a medical ID bracelet or chain, and carry a card that describes your disease and details of your medication and dosage times. What side effects may I notice from receiving this medication? Side effects that you should report to your care team as soon as possible: Allergic reactions--skin rash, itching, hives, swelling of the face, lips, tongue, or throat Low blood sugar (hypoglycemia)--tremors or shaking, anxiety, sweating, cold or clammy skin, confusion, dizziness, rapid heartbeat Low potassium level--muscle pain or cramps, unusual weakness or fatigue, fast or irregular heartbeat, constipation Side effects that usually do not require medical attention (report to your care team if they continue or are bothersome): Lipodystrophy--hardening or scarring of tissue at injection site Pain, redness, or irritation at injection site Weight  gain This list may not describe all possible side effects. Call your doctor for medical advice about side effects. You may report side effects to FDA at 1-800-FDA-1088. Where should I keep my medication? Keep out of the reach of children and pets. Unopened Vials: Lantus vials: Store in a refrigerator between 2 and 8 degrees C (36 and 46 degrees F) or at room temperature below 30 degrees C (86 degrees F). Do not freeze or use if the insulin has been  frozen. Protect from light and excessive heat. If stored at room temperature, the vial must be discarded after 28 days. Throw away any unopened and unused medication that has been stored in the refrigerator after the expiration date. Unopened Pens: Neurosurgeon: Store in a refrigerator between 2 and 8 degrees C (36 and 46 degrees F) or at room temperature below 30 degrees C (86 degrees F). Do not freeze or use if the insulin has been frozen. Protect from light and excessive heat. If stored at room temperature, the pen must be discarded after 28 days. Throw away any unopened and unused medication that has been stored in the refrigerator after the expiration date. Lantus Solostar Pens: Store in a refrigerator between 2 and 8 degrees C (36 and 46 degrees F) or at room temperature below 30 degrees C (86 degrees F). Do not freeze or use if the insulin has been frozen. Protect from light and excessive heat. If stored at room temperature, the pen must be discarded after 28 days. Throw away any unopened and unused medication that has been stored in the refrigerator after the expiration date. Semglee Pens: Store in a refrigerator between 2 and 8 degrees C (36 and 46 degrees F) or at room temperature below 30 degrees C (86 degrees F). Do not freeze or use if the insulin has been frozen. Protect from light and excessive heat. If stored at room temperature, the pen must be discarded after 28 days. Throw away any unopened and unused medication that has been stored in the refrigerator after the expiration date. Toujeo Solostar Pens or Toujeo Max [employer] Pens: Store in a refrigerator between 2 and 8 degrees C (36 and 46 degrees F). Do not freeze or use if the insulin has been frozen. Protect from light and excessive heat. Throw away any unopened and unused medication that has been stored in the refrigerator after the expiration date. Vials that you are using: Lantus vials: Store in a refrigerator or at room  temperature below 30 degrees C (86 degrees F). Do not freeze. Keep away from heat and light. Throw the opened vial away after 28 days. Semglee vials: Store in a refrigerator or at room temperature below 30 degrees C (86 degrees F). Do not freeze. Keep away from heat and light. Throw the opened vial away after 28 days. Pens that you are using: Basaglar KwikPens: Store at room temperature below 30 degrees C (86 degrees F). Do not refrigerate or freeze. Keep away from heat and light. Throw the pen away after 28 days, even if it still has insulin left in it. Lantus Solostar Pens: Store at room temperature below 30 degrees C (86 degrees F). Do not refrigerate or freeze. Keep away from heat and light. Throw the pen away after 28 days, even if it still has insulin left in it. Semglee Pens: Store at room temperature below 30 degrees C (86 degrees F). Do not refrigerate or freeze. Keep away from heat and light. Throw the pen away after  28 days, even if it still has insulin left in it. Toujeo Solostar Pens or Toujeo Max [employer] Pens: Store at room temperature below 30 degrees C (86 degrees F). Do not refrigerate or freeze. Keep away from heat and light. Throw the pen away after 56 days, even if it still has insulin left in it. NOTE: This sheet is a summary. It may not cover all possible information. If you have questions about this medicine, talk to your doctor, pharmacist, or health care provider.  2022 [employer] (2020-05-30 00:00:00)

## 2021-05-26 NOTE — Progress Notes (Signed)
° °Subjective:  °Patient ID: Carlos Phillips, male    DOB: 07/24/1965  Age: 56 y.o. MRN: [id number] ° °CC:  °Chief Complaint  °Patient presents with  ° Diabetes  °  Pt here for 1 week recheck as he tries to lower BG at home. Some 174, 191, 153, readings but mostly still low to mid 200 readings. No questions from pt at this time. Started 20 units Sunday morning   ° ° °HPI °Carlos Phillips presents for  ° °Diabetes: °With hyperglycemia, see most recent visit on February 22.  Initially started on Lantus February 10, persistent hyperglycemia at his last visit.  Increased from 14units to 18 units, with plan to increase to 20 units if persistent elevated readings. Referred to nutritionist as well - call received, has not called back to schedule.  °Has been on 20 units past 4 days.  Has continue Januvia 50 mg daily. °Home readings past 3 days on 20 units: °Fasting: 203, 186,171 °Postprandial 175, 153 (not much to at that day) 192, 228, 194.  °No symptomatic lows.  ° °Lab Results  °Component Value Date  ° HGBA1C 14.5 (H) 05/06/2021  ° HGBA1C 6.9 (H) 06/08/2020  ° HGBA1C 6.4 (H) 03/25/2020  ° °Lab Results  °Component Value Date  ° MICROALBUR 0.3 06/21/2015  ° LDLCALC 149 (H) 06/08/2020  ° CREATININE 1.50 05/06/2021  ° ° ° °History °Patient Active Problem List  ° Diagnosis Date Noted  ° Diplopia   ° Ophthalmoplegia of right eye   ° CVA (cerebral vascular accident) (HCC) 03/24/2020  ° Acute CVA (cerebrovascular accident) (HCC) 03/24/2020  ° Hyperlipidemia 12/31/2016  ° PVD (peripheral vascular disease) (HCC) 01/29/2016  ° Diverticulitis 01/28/2016  ° Special screening for malignant neoplasms, colon   ° Type 2 diabetes mellitus with diabetic dermatitis, without long-term current use of insulin (HCC) 05/03/2015  ° History of deep vein thrombosis (DVT) of lower extremity 03/07/2013  ° Ischemia of extremity 02/25/2013  ° HTN (hypertension) 05/07/2011  ° Erectile dysfunction 05/07/2011  ° Obesity 05/07/2011  ° °Past Medical  History:  °Diagnosis Date  ° Diabetes mellitus without complication (HCC)   ° DVT (deep venous thrombosis) (HCC) 02/2013  ° Erectile dysfunction   ° Hyperlipidemia   ° Hypertension   ° Obesity   ° BMI >52  ° PVD (peripheral vascular disease) (HCC)   ° °Past Surgical History:  °Procedure Laterality Date  ° ABDOMINAL AORTAGRAM N/A 02/28/2013  ° Procedure: ABDOMINAL AORTAGRAM;  Surgeon: Brian L Chen, MD;  Location: [facility];  Service: Cardiovascular;  Laterality: N/A;  ° COLONOSCOPY WITH PROPOFOL N/A 12/25/2015  ° Procedure: COLONOSCOPY WITH PROPOFOL;  Surgeon: Steven Paul Armbruster, MD;  Location: [facility];  Service: Gastroenterology;  Laterality: N/A;  ° EMBOLECTOMY Left 02/25/2013  ° Procedure: Left Popliteal EMBOLECTOMY Poss fasciotomy;  Surgeon: Charles E Fields, MD;  Location: [facility] OR;  Service: Vascular;  Laterality: Left;  Left poplital and Tibial embolectomy with four compartment Fasciotomy with vein patch angioplasty left popliteal artery.  ° °Allergies  °Allergen Reactions  ° Morphine And Related Nausea And Vomiting  ° °Prior to Admission medications   °Medication Sig Start Date End Date Taking? Authorizing Provider  °amLODipine (NORVASC) 10 MG tablet Take 1 tablet (10 mg total) by mouth daily. 05/06/21  Yes Greene, Jeffrey R, MD  °atorvastatin (LIPITOR) 40 MG tablet Take 1 tablet (40 mg total) by mouth daily. 05/06/21  Yes Greene, Jeffrey R, MD  °blood glucose meter kit and supplies Dispense based on   on patient and insurance preference. Use 3 times daily. E11.65 05/07/21  Yes Wendie Agreste, MD  clopidogrel (PLAVIX) 75 MG tablet TAKE 1 TABLET BY MOUTH EVERY DAY WITH BREAKFAST 05/06/21  Yes Wendie Agreste, MD  hydrochlorothiazide (HYDRODIURIL) 25 MG tablet Take 1 tablet (25 mg total) by mouth daily. 05/06/21  Yes Wendie Agreste, MD  insulin glargine (LANTUS SOLOSTAR) 100 UNIT/ML Solostar Pen Inject 10 Units into the skin daily. Patient taking differently: Inject 20 Units into the skin daily. 05/07/21  Yes  Wendie Agreste, MD  Insulin Pen Needle (PEN NEEDLES) 32G X 4 MM MISC 1 application by Does not apply route daily. 05/07/21  Yes Wendie Agreste, MD  losartan (COZAAR) 25 MG tablet Take 1 tablet (25 mg total) by mouth daily. 05/06/21  Yes Wendie Agreste, MD  Multiple Vitamins-Minerals (MULTIVITAMIN WITH MINERALS) tablet Take 1 tablet by mouth daily.   Yes [provider]  sildenafil (VIAGRA) 100 MG tablet TAKE 1/2 TO 1 TABLET BY MOUTH EVERY DAY AS NEEDED FOR ERECTILE DYSFUNCTION 06/24/20  Yes Wendie Agreste, MD  sitaGLIPtin (JANUVIA) 50 MG tablet Take 1 tablet (50 mg total) by mouth daily. 05/06/21  Yes Wendie Agreste, MD   Social History   Socioeconomic History   Marital status: Married    Spouse name: Ivin Booty   Number of children: 2   Years of education: Not on file   Highest education level: Not on file  Occupational History   Occupation: Truck driver  Tobacco Use   Smoking status: Former    Packs/day: 1.00    Years: 30.00    Pack years: 30.00    Types: Cigarettes    Quit date: 02/25/2013    Years since quitting: 8.2   Smokeless tobacco: Never  Vaping Use   Vaping Use: Never used  Substance and Sexual Activity   Alcohol use: Yes    Alcohol/week: 4.0 standard drinks    Types: 4 Standard drinks or equivalent per week    Comment: social- rare   Drug use: No   Sexual activity: Yes    Partners: Female  Other Topics Concern   Not on file  Social History Narrative   Not on file   Social Determinants of Health   Financial Resource Strain: Not on file  Food Insecurity: Not on file  Transportation Needs: Not on file  Physical Activity: Not on file  Stress: Not on file  Social Connections: Not on file  Intimate Partner Violence: Not on file    Review of Systems  No n/v/abd pain.  More energy.  Less frequent urination and better sleep.  Objective:   Vitals:   05/26/21 1128  BP: 136/78  Pulse: 63  Resp: 17  Temp: 97.7 F (36.5 C)  TempSrc:  Temporal  SpO2: 96%  Weight: (!) 379 lb 12.8 oz (172.3 kg)  Height: 6' (1.829 m)    Physical Exam Vitals reviewed.  Constitutional:      Appearance: He is well-developed.  HENT:     Head: Normocephalic and atraumatic.  Eyes:     Extraocular Movements: Extraocular movements intact.     Pupils: Pupils are equal, round, and reactive to light.     Comments: No nystagmus, no diplopia.   Neck:     Vascular: No carotid bruit or JVD.  Cardiovascular:     Rate and Rhythm: Normal rate and regular rhythm.     Heart sounds: Normal heart sounds. No murmur heard. Pulmonary:  Effort: Pulmonary effort is normal.  °   Breath sounds: Normal breath sounds. No rales.  °Musculoskeletal:  °   Right lower leg: No edema.  °   Left lower leg: No edema.  °   Comments: FROM, full strength UE/LE bilaterally.   °Skin: °   General: Skin is warm and dry.  °Neurological:  °   Mental Status: He is alert and oriented to person, place, and time.  °Psychiatric:     °   Mood and Affect: Mood normal.  ° ° ° °Results for orders placed or performed in visit on 05/26/21  °POCT glucose (manual entry)  °Result Value Ref Range  ° POC Glucose 237 (A) 70 - 99 mg/dl  ° ° ° ° °Assessment & Plan:  °Tarick K Mapps is a 55 y.o. male . °Type 2 diabetes mellitus with hyperglycemia, without long-term current use of insulin (HCC) - Plan: POCT glucose (manual entry), sitaGLIPtin (JANUVIA) 100 MG tablet ° - improving control, increase januvia to 100mg qd, continue 20u lantus, recheck in 2 weeks. Regular meals, and hypoglycemia precautions as control improves. Follow up with nutrition/diabetes classes. Recheck in 2 weeks. 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   ° °Meds ordered this encounter  °Medications  ° sitaGLIPtin (JANUVIA) 100 MG tablet  °  Sig: Take 1 tablet (100 mg total) by mouth daily.  °  Dispense:  90 tablet  °  Refill:  1  ° °Patient Instructions   °Increase januvia to 100mg per day.  °Stay at 20 units insulin for now.  °If readings over 250 call and we can discuss other changes.  °Do not skip meals since you are taking insulin.  °If any low blood sugars let me know right away. °Recheck in 2 weeks.  ° ° °Insulin Glargine Injection °What is this medication? °INSULIN GLARGINE (IN su lin GLAR geen) treats diabetes. It works by increasing insulin levels in your body, which decreases your blood sugar (glucose). It belongs to a group of medications called long-acting insulins or basal insulins. Changes to diet and exercise are often combined with this medication. °This medicine may be used for other purposes; ask your health care provider or pharmacist if you have questions. °COMMON BRAND NAME(S): BASAGLAR, Lantus, Lantus SoloStar, Semglee, Toujeo Max SoloStar, Toujeo SoloStar °What should I tell my care team before I take this medication? °They need to know if you have any of these conditions: °Episodes of low blood sugar °Eye disease, vision problems °Kidney disease °Liver disease °An unusual or allergic reaction to insulin, metacresol, other medications, foods, dyes, or preservatives °Pregnant or trying to get pregnant °Breast-feeding °How should I use this medication? °This medication is for injection under the skin. Use this medication at the same time each day. Use exactly as directed. This insulin should never be mixed in the same syringe with other insulins before injection. Do not vigorously shake before use. You will be taught how to use this medication and how to adjust doses for activities and illness. Do not use more insulin than prescribed. °Always check the appearance of your insulin before using it. This medication should be clear and colorless like water. Do not use it if it is cloudy, thickened, colored, or has solid particles in it. °If you use an insulin pen, be sure to take off the outer needle cover before using the dose. °It is important that  you put your   used needles and syringes in a special sharps container. Do not put them in a trash can. If you do not have a sharps container, call your pharmacist or care team to get one. °This medication comes with INSTRUCTIONS FOR USE. Ask your pharmacist for directions on how to use this medication. Read the information carefully. Talk to your pharmacist or care team if you have questions. °Talk to your care team regarding the use of this medication in children. While this medication may be prescribed for children as young as 6 years for selected conditions, precautions do apply. °Overdosage: If you think you have taken too much of this medicine contact a poison control center or emergency room at once. °NOTE: This medicine is only for you. Do not share this medicine with others. °What if I miss a dose? °It is important not to miss a dose. Your care team should discuss a plan for missed doses with you. If you do miss a dose, follow their plan. Do not take double doses. °What may interact with this medication? °Alcohol containing beverages °Antiviral medications for HIV or AIDS °Aspirin and aspirin-like medications °Beta-blockers like atenolol, metoprolol, propranolol °Certain medications for blood pressure, heart disease, irregular heart beat °Chromium °Clonidine °Diuretics °Male hormones, such as estrogens or progestins, birth control pills °Fenofibrate °Gemfibrozil °Guanethidine °Isoniazid °Lanreotide °Male hormones or anabolic steroids °MAOIs like Carbex, Eldepryl, Marplan, Nardil, and Parnate °Medications for weight loss °Medications for allergies, asthma, cold, or cough °Medications for depression, anxiety, or psychotic disturbances °Niacin °Nicotine °NSAIDs, medications for pain and inflammation, like ibuprofen or naproxen °Octreotide °Other medications for diabetes, like glyburide, glipizide, or glimepiride °Pasireotide °Pentamidine °Phenytoin °Probenecid °Quinolone antibiotics such as ciprofloxacin,  levofloxacin, ofloxacin °Reserpine °Some herbal dietary supplements °Steroid medications such as prednisone or cortisone °Sulfamethoxazole; trimethoprim °Thyroid hormones °This list may not describe all possible interactions. Give your health care provider a list of all the medicines, herbs, non-prescription drugs, or dietary supplements you use. Also tell them if you smoke, drink alcohol, or use illegal drugs. Some items may interact with your medicine. °What should I watch for while using this medication? °Visit your care team for regular checks on your progress. °Do not drive, use machinery, or do anything that needs mental alertness until you know how this medication affects you. Alcohol may interfere with the effect of this medication. Avoid alcoholic drinks. °A test called the HbA1C (A1C) will be monitored. This is a simple blood test. It measures your blood sugar control over the last 2 to 3 months. You will receive this test every 3 to 6 months. °Learn how to check your blood sugar. Learn the symptoms of low and high blood sugar and how to manage them. °Always carry a quick-source of sugar with you in case you have symptoms of low blood sugar. Examples include hard sugar candy or glucose tablets. Make sure others know that you can choke if you eat or drink when you develop serious symptoms of low blood sugar, such as seizures or unconsciousness. They must get medical help at once. °Tell your care team if you have high blood sugar. You might need to change the dose of your medication. If you are sick or exercising more than usual, you might need to change the dose of your medication. °Do not skip meals. Ask your care team if you should avoid alcohol. Many nonprescription cough and cold products contain sugar or alcohol. These can affect blood sugar. °Make sure that you have the right kind of syringe   for the type of insulin you use. Try not to change the brand and type of insulin or syringe unless your care  team tells you to. Switching insulin brand or type can cause dangerously high or low blood sugar. Always keep an extra supply of insulin, syringes, and needles on hand. Use a syringe one time only. Throw away syringe and needle in a closed container to prevent accidental needle sticks. °Insulin pens and cartridges should never be shared. Even if the needle is changed, sharing may result in passing of viruses like hepatitis or HIV. °Each time you get a new box of pen needles, check to see if they are the same type as the ones you were trained to use. If not, ask your care team to show you how to use this new type properly. °Wear a medical ID bracelet or chain, and carry a card that describes your disease and details of your medication and dosage times. °What side effects may I notice from receiving this medication? °Side effects that you should report to your care team as soon as possible: °Allergic reactions--skin rash, itching, hives, swelling of the face, lips, tongue, or throat °Low blood sugar (hypoglycemia)--tremors or shaking, anxiety, sweating, cold or clammy skin, confusion, dizziness, rapid heartbeat °Low potassium level--muscle pain or cramps, unusual weakness or fatigue, fast or irregular heartbeat, constipation °Side effects that usually do not require medical attention (report to your care team if they continue or are bothersome): °Lipodystrophy--hardening or scarring of tissue at injection site °Pain, redness, or irritation at injection site °Weight gain °This list may not describe all possible side effects. Call your doctor for medical advice about side effects. You may report side effects to FDA at 1-800-FDA-1088. °Where should I keep my medication? °Keep out of the reach of children and pets. °Unopened Vials: °Lantus vials: Store in a refrigerator between 2 and 8 degrees C (36 and 46 degrees F) or at room temperature below 30 degrees C (86 degrees F). Do not freeze or use if the insulin has been  frozen. Protect from light and excessive heat. If stored at room temperature, the vial must be discarded after 28 days. Throw away any unopened and unused medication that has been stored in the refrigerator after the expiration date. °Unopened Pens: °Basaglar KwikPens: Store in a refrigerator between 2 and 8 degrees C (36 and 46 degrees F) or at room temperature below 30 degrees C (86 degrees F). Do not freeze or use if the insulin has been frozen. Protect from light and excessive heat. If stored at room temperature, the pen must be discarded after 28 days. Throw away any unopened and unused medication that has been stored in the refrigerator after the expiration date. °Lantus Solostar Pens: Store in a refrigerator between 2 and 8 degrees C (36 and 46 degrees F) or at room temperature below 30 degrees C (86 degrees F). Do not freeze or use if the insulin has been frozen. Protect from light and excessive heat. If stored at room temperature, the pen must be discarded after 28 days. Throw away any unopened and unused medication that has been stored in the refrigerator after the expiration date. °Semglee Pens: Store in a refrigerator between 2 and 8 degrees C (36 and 46 degrees F) or at room temperature below 30 degrees C (86 degrees F). Do not freeze or use if the insulin has been frozen. Protect from light and excessive heat. If stored at room temperature, the pen must be discarded after   28 days. Throw away any unopened and unused medication that has been stored in the refrigerator after the expiration date. °Toujeo Solostar Pens or Toujeo Max Solostar Pens: Store in a refrigerator between 2 and 8 degrees C (36 and 46 degrees F). Do not freeze or use if the insulin has been frozen. Protect from light and excessive heat. Throw away any unopened and unused medication that has been stored in the refrigerator after the expiration date. °Vials that you are using: °Lantus vials: Store in a refrigerator or at room  temperature below 30 degrees C (86 degrees F). Do not freeze. Keep away from heat and light. Throw the opened vial away after 28 days. °Semglee vials: Store in a refrigerator or at room temperature below 30 degrees C (86 degrees F). Do not freeze. Keep away from heat and light. Throw the opened vial away after 28 days. °Pens that you are using: °Basaglar KwikPens: Store at room temperature below 30 degrees C (86 degrees F). Do not refrigerate or freeze. Keep away from heat and light. Throw the pen away after 28 days, even if it still has insulin left in it. °Lantus Solostar Pens: Store at room temperature below 30 degrees C (86 degrees F). Do not refrigerate or freeze. Keep away from heat and light. Throw the pen away after 28 days, even if it still has insulin left in it. °Semglee Pens: Store at room temperature below 30 degrees C (86 degrees F). Do not refrigerate or freeze. Keep away from heat and light. Throw the pen away after 28 days, even if it still has insulin left in it. °Toujeo Solostar Pens or Toujeo Max Solostar Pens: Store at room temperature below 30 degrees C (86 degrees F). Do not refrigerate or freeze. Keep away from heat and light. Throw the pen away after 56 days, even if it still has insulin left in it. °NOTE: This sheet is a summary. It may not cover all possible information. If you have questions about this medicine, talk to your doctor, pharmacist, or health care provider. °© 2022 [employer] (2020-05-30 00:00:00) ° ° ° ° °Signed,  ° °Jeffrey Greene, MD °[facility], [place] °[facility] °05/26/21 °12:36 PM ° ° °

## 2021-05-27 ENCOUNTER — Ambulatory Visit: Payer: BC Managed Care – PPO | Admitting: Family Medicine

## 2021-05-28 ENCOUNTER — Telehealth: Payer: Self-pay

## 2021-05-28 NOTE — Telephone Encounter (Signed)
Called pt to discuss leave paperwork.  ? ?Paperwork is completed pt is documented to return to work after 06/09/21 visit this appears to have been canceled he will need to reschedule this for a final exam before we can safely release him to return to work.  ? ?Please reschedule appointment from 06/09/21 as close to that date as possible  ?

## 2021-05-31 NOTE — Telephone Encounter (Signed)
Called pt to reschedule appointment for the 15th he notes he did not cancel this appointment (11:40 06/09/21) but this was no longer booked when I talked to the patient today.  ? ? ?Did get him rescheduled for the same date and time and informed him about his paperwork as well  ? ?

## 2021-06-03 ENCOUNTER — Other Ambulatory Visit: Payer: Self-pay | Admitting: Family Medicine

## 2021-06-09 ENCOUNTER — Ambulatory Visit: Payer: BC Managed Care – PPO | Admitting: Family Medicine

## 2021-06-09 ENCOUNTER — Encounter: Payer: Self-pay | Admitting: Family Medicine

## 2021-06-09 VITALS — BP 134/78 | HR 69 | Temp 98.1°F | Resp 17 | Ht 72.0 in | Wt 375.8 lb

## 2021-06-09 DIAGNOSIS — E1165 Type 2 diabetes mellitus with hyperglycemia: Secondary | ICD-10-CM

## 2021-06-09 DIAGNOSIS — H538 Other visual disturbances: Secondary | ICD-10-CM

## 2021-06-09 LAB — GLUCOSE, POCT (MANUAL RESULT ENTRY): POC Glucose: 132 mg/dl — AB (ref 70–99)

## 2021-06-09 NOTE — Patient Instructions (Addendum)
If any further readings below 100 - decrease lantus to 18 units. Otherwise remain on same meds for now.  ?Call nutritionist for appointment.  ?I will refer you to eye doctor in next week if possible to discuss new blurry vision.  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 ? ?Recheck in 2 weeks.  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  ? ?Return to the clinic or go to the nearest emergency room if any of your symptoms worsen or new symptoms occur. ? ?

## 2021-06-09 NOTE — Progress Notes (Signed)
? ?Subjective:  ?Patient ID: Carlos Phillips, male    DOB: 27-Jun-1965  Age: 56 y.o. MRN: [id number] ? ?CC:  ?Chief Complaint  ?Patient presents with  ? Diabetes  ?  Pt notes some bluring vision within the last 2 weeks and wondered if you recommended an eye dr   ? ? ?HPI ?Carlos Phillips presents for  ? ?Diabetes: ? ?With hyperglycemia, initially started on Lantus February 10 with subsequent increases on most recent visits.  Last seen March 1.  Still some readings in the 200s but improving.  Was continued on Januvia, increased from 50 to 100 mg last visit.  Continued on 20 units of Lantus.  Also plan for nutritionist follow-up.-It appears he was called on February 22 and March 8 with messages left. 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  ?Home readings fasting: 127, 133, 132, 126, 119, 110, 115, 110. ?Home readings postprandial: 133, 161, 167, 113, 139, 158, 126, 112, 132, 150 ?Symptomatic lows: none. Lowest 96 few days ago - fasting.  ?Some blurry vision recently started 1 week ago - both eyes, no pain or discharge. Noted after rubbing both eyes. Blurry, no diplopia. No new HA. Able to see better with otc readers.  ?Feels better, more energy.  ?Plans to call nutritionist today.  ? ?Microalbumin: Normal February last year.  We will recheck today. ?Optho, foot exam, pneumovax:  ?Plan for ophthalmology referral.  Will refer today. ? ? ? ?Lab Results  ?Component Value Date  ? HGBA1C 14.5 (H) 05/06/2021  ? HGBA1C 6.9 (H) 06/08/2020  ? HGBA1C 6.4 (H) 03/25/2020  ? ?Lab Results  ?Component Value Date  ? MICROALBUR 0.3 06/21/2015  ? LDLCALC 149 (H) 06/08/2020  ? CREATININE 1.50 05/06/2021  ? ? ? ?History ?Patient Active Problem List  ? Diagnosis Date Noted  ? Diplopia   ? Ophthalmoplegia of right eye   ? CVA (cerebral vascular accident) ([place])  03/24/2020  ? Acute CVA (cerebrovascular accident) ([place]) 03/24/2020  ? Hyperlipidemia 12/31/2016  ? PVD (peripheral vascular disease) ([place]) 01/29/2016  ? Diverticulitis 01/28/2016  ? Special screening for malignant neoplasms, colon   ? Type 2 diabetes mellitus with diabetic dermatitis, without long-term current use of insulin ([place]) 05/03/2015  ? History of deep vein thrombosis (DVT) of lower extremity 03/07/2013  ? Ischemia of extremity 02/25/2013  ? HTN (hypertension) 05/07/2011  ? Erectile dysfunction 05/07/2011  ? Obesity 05/07/2011  ? ?Past Medical History:  ?Diagnosis Date  ? Diabetes mellitus without complication ([place])   ? DVT (deep venous thrombosis) ([place]) 02/2013  ? Erectile dysfunction   ? Hyperlipidemia   ? Hypertension   ? Obesity   ? BMI >52  ? PVD (peripheral vascular disease) ([place])   ? ?Past Surgical History:  ?Procedure Laterality Date  ? ABDOMINAL AORTAGRAM N/A 02/28/2013  ? Procedure: ABDOMINAL AORTAGRAM;  Surgeon: Conrad Winston, MD;  Location: [facility] CATH [facility];  Service: Cardiovascular;  Laterality: N/A;  ? COLONOSCOPY WITH PROPOFOL N/A 12/25/2015  ? Procedure: COLONOSCOPY WITH PROPOFOL;  Surgeon: Manus Gunning, MD;  Location: [facility]OSCOPY;  Service: Gastroenterology;  Laterality: N/A;  ? EMBOLECTOMY Left 02/25/2013  ? Procedure: Left Popliteal EMBOLECTOMY Poss fasciotomy;  Surgeon: Elam Dutch, MD;  Location[facility] OR;  Service: Vascular;  Laterality: Left;  Left poplital and Tibial embolectomy with four  compartment Fasciotomy with vein patch angioplasty left popliteal artery.  ? ?Allergies  ?Allergen Reactions  ? Morphine And Related Nausea And Vomiting  ? ?Prior to Admission medications   ?Medication Sig Start Date End Date Taking? Authorizing Provider  ?ACCU-CHEK GUIDE test strip USE TO TEST BLOOD SUGAR 3 TIMES A DAY 06/03/21  Yes Wendie Agreste, MD  ?amLODipine (NORVASC) 10 MG tablet Take 1 tablet (10 mg total) by mouth daily. 05/06/21  Yes Wendie Agreste, MD  ?atorvastatin (LIPITOR) 40 MG  tablet Take 1 tablet (40 mg total) by mouth daily. 05/06/21  Yes Wendie Agreste, MD  ?blood glucose meter kit and supplies Dispense based on patient and insurance preference. Use 3 times daily. E11.65 05/07/21  Yes Wendie Agreste, MD  ?clopidogrel (PLAVIX) 75 MG tablet TAKE 1 TABLET BY MOUTH EVERY DAY WITH BREAKFAST 05/06/21  Yes Wendie Agreste, MD  ?hydrochlorothiazide (HYDRODIURIL) 25 MG tablet Take 1 tablet (25 mg total) by mouth daily. 05/06/21  Yes Wendie Agreste, MD  ?insulin glargine (LANTUS SOLOSTAR) 100 UNIT/ML Solostar Pen Inject 10 Units into the skin daily. ?Patient taking differently: Inject 20 Units into the skin daily. 05/07/21  Yes Wendie Agreste, MD  ?Insulin Pen Needle (PEN NEEDLES) 32G X 4 MM MISC 1 application by Does not apply route daily. 05/07/21  Yes Wendie Agreste, MD  ?losartan (COZAAR) 25 MG tablet Take 1 tablet (25 mg total) by mouth daily. 05/06/21  Yes Wendie Agreste, MD  ?Multiple Vitamins-Minerals (MULTIVITAMIN WITH MINERALS) tablet Take 1 tablet by mouth daily.   Yes [provider]  ?sildenafil (VIAGRA) 100 MG tablet TAKE 1/2 TO 1 TABLET BY MOUTH EVERY DAY AS NEEDED FOR ERECTILE DYSFUNCTION 06/24/20  Yes Wendie Agreste, MD  ?sitaGLIPtin (JANUVIA) 100 MG tablet Take 1 tablet (100 mg total) by mouth daily. 05/26/21  Yes Wendie Agreste, MD  ? ?Social History  ? ?Socioeconomic History  ? Marital status: Married  ?  Spouse name: Ivin Booty  ? Number of children: 2  ? Years of education: Not on file  ? Highest education level: Not on file  ?Occupational History  ? Occupation: Truck Geophysicist/field seismologist  ?Tobacco Use  ? Smoking status: Former  ?  Packs/day: 1.00  ?  Years: 30.00  ?  Pack years: 30.00  ?  Types: Cigarettes  ?  Quit date: 02/25/2013  ?  Years since quitting: 8.2  ? Smokeless tobacco: Never  ?Vaping Use  ? Vaping Use: Never used  ?Substance and Sexual Activity  ? Alcohol use: Yes  ?  Alcohol/week: 4.0 standard drinks  ?  Types: 4 Standard drinks or equivalent per week  ?   Comment: social- rare  ? Drug use: No  ? Sexual activity: Yes  ?  Partners: Female  ?Other Topics Concern  ? Not on file  ?Social History Narrative  ? Not on file  ? ?Social Determinants of Health  ? ?Financial Resource Strain: Not on file  ?Food Insecurity: Not on file  ?Transportation Needs: Not on file  ?Physical Activity: Not on file  ?Stress: Not on file  ?Social Connections: Not on file  ?Intimate Partner Violence: Not on file  ? ? ?Review of Systems ? ? ?Objective:  ? ?Vitals:  ? 06/09/21 1127  ?BP: 134/78  ?Pulse: 69  ?Resp: 17  ?Temp: 98.1 ?F (36.7 ?C)  ?TempSrc: Temporal  ?SpO2: 97%  ?Weight: (!) 375 lb 12.8 oz (170.5 kg)  ?Height: 6' (1.829 m)  ? ?  Vision Screening  ? Right eye Left eye Both eyes  ?Without correction 20/70 20/70 20/70  ?With correction 20/20 20/25 20/15  ?Using reading glasses.  Previous vision screening in May 2020 with 20/15 OD, OS, OU. ? ? ? ?Physical Exam ?Vitals reviewed.  ?Constitutional:   ?   Appearance: He is well-developed.  ?HENT:  ?   Head: Normocephalic and atraumatic.  ?Eyes:  ?   Extraocular Movements: Extraocular movements intact.  ?   Pupils: Pupils are equal, round, and reactive to light.  ?   Comments: Anterior chamber clear, no scleral injection.  No discharge.  ?Neck:  ?   Vascular: No carotid bruit or JVD.  ?Cardiovascular:  ?   Rate and Rhythm: Normal rate and regular rhythm.  ?   Heart sounds: Normal heart sounds. No murmur heard. ?Pulmonary:  ?   Effort: Pulmonary effort is normal.  ?   Breath sounds: Normal breath sounds. No rales.  ?Musculoskeletal:  ?   Right lower leg: No edema.  ?   Left lower leg: No edema.  ?Skin: ?   General: Skin is warm and dry.  ?Neurological:  ?   Mental Status: He is alert and oriented to person, place, and time.  ?Psychiatric:     ?   Mood and Affect: Mood normal.  ? ? ? ?Results for orders placed or performed in visit on 06/09/21  ?POCT glucose (manual entry)  ?Result Value Ref Range  ? POC Glucose 132 (A) 70 - 99 mg/dl   ? ? ? ?Assessment & Plan:  ?Carlos Phillips is a 56 y.o. male . ?Type 2 diabetes mellitus with hyperglycemia, without long-term current use of insulin ([place]) - Plan: POCT glucose (manual entry), Ambulatory ref

## 2021-06-16 DIAGNOSIS — G4731 Primary central sleep apnea: Secondary | ICD-10-CM | POA: Diagnosis not present

## 2021-06-23 ENCOUNTER — Encounter: Payer: Self-pay | Admitting: Family Medicine

## 2021-06-23 ENCOUNTER — Ambulatory Visit: Payer: BC Managed Care – PPO | Admitting: Family Medicine

## 2021-06-23 VITALS — BP 128/80 | HR 72 | Temp 97.8°F | Resp 17 | Ht 72.0 in | Wt 372.8 lb

## 2021-06-23 DIAGNOSIS — E1165 Type 2 diabetes mellitus with hyperglycemia: Secondary | ICD-10-CM

## 2021-06-23 LAB — GLUCOSE, POCT (MANUAL RESULT ENTRY): POC Glucose: 126 mg/dl — AB (ref 70–99)

## 2021-06-23 MED ORDER — EMPAGLIFLOZIN 10 MG PO TABS
10.0000 mg | ORAL_TABLET | Freq: Every day | ORAL | 2 refills | Status: DC
Start: 2021-06-23 — End: 2021-12-23

## 2021-06-23 MED ORDER — TRULICITY 0.75 MG/0.5ML ~~LOC~~ SOAJ: 0.7500 mg | SUBCUTANEOUS | 1 refills | Status: DC

## 2021-06-23 NOTE — Patient Instructions (Addendum)
Great work on diet changes and weight loss.  Diabetes numbers look much better.  I think it is reasonable to try switching the insulin to a different medication..  I have prescribed jardiance. If that is covered, then stop insulin on the day you start that medicine.  Watch for signs of yeast infection or urinary tract infection as we discussed and follow-up if that occurs.  Monitor your blood sugars closely like you have been doing, and if any readings over 250 let me know.  Recheck in few weeks.  If still controlled on new medicine may be easier from DOT standpoint instead of insulin. ? ?Take care.  ? ?

## 2021-06-23 NOTE — Progress Notes (Signed)
? ?Subjective:  ?Patient ID: Carlos Phillips, male    DOB: 1966/01/18  Age: 56 y.o. MRN: [id number] ? ?CC:  ?Chief Complaint  ?Patient presents with  ? Diabetes  ?  Pt has been taking BG notes been doing better, did have eye exam and they reported no retnopathy will request records   ? ? ?HPI ?Carlos Phillips presents for  ? ?Diabetes: ?With hyperglycemia, started on Lantus in February followed by titrations for improved control.  Last visit March 15.  Improved readings at that time on 20 units of Lantus with few borderline lows.  Insulin was decreased to 18 units/day.  Continued on Januvia 152m qd.  Weight has continued to improve. ?Home readings:  ?Fasting 95-125 ?Postprandial 98-152 ?Lowest 95-98, no symptomatic lows.  ?Feels well. Avoiding sodas, has made significant diet changes.  ?Had eye exam - told was ok.  ?Blurry vision improved. Has new glasses - working well.  ? ?Results for orders placed or performed in visit on 06/23/21  ?POCT glucose (manual entry)  ?Result Value Ref Range  ? POC Glucose 126 (A) 70 - 99 mg/dl  ? ? ?Wt Readings from Last 3 Encounters:  ?06/23/21 (!) 372 lb 12.8 oz (169.1 kg)  ?06/09/21 (!) 375 lb 12.8 oz (170.5 kg)  ?05/26/21 (!) 379 lb 12.8 oz (172.3 kg)  ? ? ?Lab Results  ?Component Value Date  ? HGBA1C 14.5 (H) 05/06/2021  ? HGBA1C 6.9 (H) 06/08/2020  ? HGBA1C 6.4 (H) 03/25/2020  ? ?Lab Results  ?Component Value Date  ? MICROALBUR 0.3 06/21/2015  ? LDLCALC 149 (H) 06/08/2020  ? CREATININE 1.50 05/06/2021  ? ? ?History ?Patient Active Problem List  ? Diagnosis Date Noted  ? Diplopia   ? Ophthalmoplegia of right eye   ? CVA (cerebral vascular accident) ([place] 03/24/2020  ? Acute CVA (cerebrovascular accident) ([place] 03/24/2020  ? Hyperlipidemia 12/31/2016  ? PVD (peripheral vascular disease) ([place] 01/29/2016  ? Diverticulitis 01/28/2016  ? Special screening for malignant neoplasms, colon   ? Type 2 diabetes mellitus with diabetic dermatitis, without long-term current use of insulin  ([place] 05/03/2015  ? History of deep vein thrombosis (DVT) of lower extremity 03/07/2013  ? Ischemia of extremity 02/25/2013  ? HTN (hypertension) 05/07/2011  ? Erectile dysfunction 05/07/2011  ? Obesity 05/07/2011  ? ?Past Medical History:  ?Diagnosis Date  ? Diabetes mellitus without complication ([place]   ? DVT (deep venous thrombosis) ([place] 02/2013  ? Erectile dysfunction   ? Hyperlipidemia   ? Hypertension   ? Obesity   ? BMI >52  ? PVD (peripheral vascular disease) ([place]   ? ?Past Surgical History:  ?Procedure Laterality Date  ? ABDOMINAL AORTAGRAM N/A 02/28/2013  ? Procedure: ABDOMINAL AORTAGRAM;  Surgeon: BConrad [place] MD;  [facility];  Service: Cardiovascular;  Laterality: N/A;  ? COLONOSCOPY WITH PROPOFOL N/A 12/25/2015  ? Procedure: COLONOSCOPY WITH PROPOFOL;  Surgeon: SManus Gunning MD;  [facility];  Service: Gastroenterology;  Laterality: N/A;  ? EMBOLECTOMY Left 02/25/2013  ? Procedure: Left Popliteal EMBOLECTOMY Poss fasciotomy;  Surgeon: CElam Dutch MD;  [facility];  Service: Vascular;  Laterality: Left;  Left poplital and Tibial embolectomy with four compartment Fasciotomy with vein patch angioplasty left popliteal artery.  ? ?Allergies  ?Allergen Reactions  ? Morphine And Related Nausea And Vomiting  ? ?Prior to Admission medications   ?Medication Sig Start Date End Date Taking? Authorizing Provider  ?ACCU-CHEK GUIDE test strip USE TO TEST BLOOD  SUGAR 3 TIMES A DAY 06/03/21  Yes Wendie Agreste, MD  ?amLODipine (NORVASC) 10 MG tablet Take 1 tablet (10 mg total) by mouth daily. 05/06/21  Yes Wendie Agreste, MD  ?atorvastatin (LIPITOR) 40 MG tablet Take 1 tablet (40 mg total) by mouth daily. 05/06/21  Yes Wendie Agreste, MD  ?blood glucose meter kit and supplies Dispense based on patient and insurance preference. Use 3 times daily. E11.65 05/07/21  Yes Wendie Agreste, MD  ?clopidogrel (PLAVIX) 75 MG tablet TAKE 1 TABLET BY MOUTH EVERY DAY WITH BREAKFAST 05/06/21   Yes Wendie Agreste, MD  ?hydrochlorothiazide (HYDRODIURIL) 25 MG tablet Take 1 tablet (25 mg total) by mouth daily. 05/06/21  Yes Wendie Agreste, MD  ?insulin glargine (LANTUS SOLOSTAR) 100 UNIT/ML Solostar Pen Inject 10 Units into the skin daily. ?Patient taking differently: Inject 20 Units into the skin daily. 05/07/21  Yes Wendie Agreste, MD  ?Insulin Pen Needle (PEN NEEDLES) 32G X 4 MM MISC 1 application by Does not apply route daily. 05/07/21  Yes Wendie Agreste, MD  ?losartan (COZAAR) 25 MG tablet Take 1 tablet (25 mg total) by mouth daily. 05/06/21  Yes Wendie Agreste, MD  ?Multiple Vitamins-Minerals (MULTIVITAMIN WITH MINERALS) tablet Take 1 tablet by mouth daily.   Yes [provider]  ?sildenafil (VIAGRA) 100 MG tablet TAKE 1/2 TO 1 TABLET BY MOUTH EVERY DAY AS NEEDED FOR ERECTILE DYSFUNCTION 06/24/20  Yes Wendie Agreste, MD  ?sitaGLIPtin (JANUVIA) 100 MG tablet Take 1 tablet (100 mg total) by mouth daily. 05/26/21  Yes Wendie Agreste, MD  ? ?Social History  ? ?Socioeconomic History  ? Marital status: Married  ?  Spouse name: Ivin Booty  ? Number of children: 2  ? Years of education: Not on file  ? Highest education level: Not on file  ?Occupational History  ? Occupation: Truck Geophysicist/field seismologist  ?Tobacco Use  ? Smoking status: Former  ?  Packs/day: 1.00  ?  Years: 30.00  ?  Pack years: 30.00  ?  Types: Cigarettes  ?  Quit date: 02/25/2013  ?  Years since quitting: 8.3  ? Smokeless tobacco: Never  ?Vaping Use  ? Vaping Use: Never used  ?Substance and Sexual Activity  ? Alcohol use: Yes  ?  Alcohol/week: 4.0 standard drinks  ?  Types: 4 Standard drinks or equivalent per week  ?  Comment: social- rare  ? Drug use: No  ? Sexual activity: Yes  ?  Partners: Female  ?Other Topics Concern  ? Not on file  ?Social History Narrative  ? Not on file  ? ?Social Determinants of Health  ? ?Financial Resource Strain: Not on file  ?Food Insecurity: Not on file  ?Transportation Needs: Not on file  ?Physical Activity:  Not on file  ?Stress: Not on file  ?Social Connections: Not on file  ?Intimate Partner Violence: Not on file  ? ? ?Review of Systems ?Per HPI.  ? ?Objective:  ? ?Vitals:  ? 06/23/21 0946  ?BP: 128/80  ?Pulse: 72  ?Resp: 17  ?Temp: 97.8 ?F (36.6 ?C)  ?TempSrc: Temporal  ?SpO2: 98%  ?Weight: (!) 372 lb 12.8 oz (169.1 kg)  ?Height: 6' (1.829 m)  ? ? ? ?Physical Exam ?Vitals reviewed.  ?Constitutional:   ?   Appearance: He is well-developed.  ?HENT:  ?   Head: Normocephalic and atraumatic.  ?Neck:  ?   Vascular: No carotid bruit or JVD.  ?Cardiovascular:  ?   Rate  and Rhythm: Normal rate and regular rhythm.  ?   Heart sounds: Normal heart sounds. No murmur heard. ?Pulmonary:  ?   Effort: Pulmonary effort is normal.  ?   Breath sounds: Normal breath sounds. No rales.  ?Musculoskeletal:  ?   Right lower leg: No edema.  ?   Left lower leg: No edema.  ?Skin: ?   General: Skin is warm and dry.  ?Neurological:  ?   Mental Status: He is alert and oriented to person, place, and time.  ?Psychiatric:     ?   Mood and Affect: Mood normal.  ? ? ? ? ? ?Assessment & Plan:  ?Carlos Phillips is a 56 y.o. male . ?Type 2 diabetes mellitus with hyperglycemia, without long-term current use of insulin ([facility]) - Plan: Fructosamine, POCT glucose (manual entry), empagliflozin (JARDIANCE) 10 MG TABS tablet, DISCONTINUED: Dulaglutide (TRULICITY) 0.09 TV/4.9NZ SOPN ?Significantly improved diabetic control, likely due to in part to his diet changes and weight loss.  We will continue Januvia, initially thought of GLP but as on DPP 4 will add SGLT2, start Jardiance with potential mycotic infection or urinary tract infection risk discussed with RTC precautions.  Stop insulin after starting Jardiance with close monitoring of home readings and RTC precautions.  3-week follow-up. ? ? ?Meds ordered this encounter  ?Medications  ? DISCONTD: Dulaglutide (TRULICITY) 1.82 UV/9.0WU SOPN  ?  Sig: Inject 0.75 mg into the skin once a week.  ?  Dispense:  6 mL  ?   Refill:  1  ? empagliflozin (JARDIANCE) 10 MG TABS tablet  ?  Sig: Take 1 tablet (10 mg total) by mouth daily before breakfast.  ?  Dispense:  30 tablet  ?  Refill:  2  ? ?Patient Instructions  ?Gr

## 2021-06-28 LAB — FRUCTOSAMINE: Fructosamine: 252 umol/L (ref 205–285)

## 2021-07-15 ENCOUNTER — Encounter: Payer: Self-pay | Admitting: Family Medicine

## 2021-07-15 ENCOUNTER — Ambulatory Visit: Payer: BC Managed Care – PPO | Admitting: Family Medicine

## 2021-07-15 VITALS — BP 128/76 | HR 75 | Temp 98.3°F | Resp 16 | Ht 72.0 in | Wt 365.6 lb

## 2021-07-15 DIAGNOSIS — E1165 Type 2 diabetes mellitus with hyperglycemia: Secondary | ICD-10-CM | POA: Diagnosis not present

## 2021-07-15 LAB — GLUCOSE, POCT (MANUAL RESULT ENTRY): POC Glucose: 135 mg/dl — AB (ref 70–99)

## 2021-07-15 MED ORDER — SITAGLIPTIN PHOSPHATE 50 MG PO TABS: 50.0000 mg | ORAL_TABLET | Freq: Every day | ORAL | 1 refills | Status: DC

## 2021-07-15 NOTE — Progress Notes (Signed)
? ?Subjective:  ?Patient ID: Carlos Phillips, male    DOB: Oct 17, 1965  Age: 56 y.o. MRN: [id number] ? ?CC:  ?Chief Complaint  ?Patient presents with  ? Diabetes  ?  Pt is down in weight again today, notes no concerns   ? ? ?HPI ?Carlos Phillips presents for  ? ?Follow-up diabetes.  See prior notes.  Prior significant hyperglycemia that improved with insulin, followed by continued decreases in amount of insulin needed.  Home readings from 90s to mid 100s at last visit without symptomatic lows.  Last visit March 29.  Started on SGLT2 with Jardiance 10 mg daily, continued Januvia.  Stop insulin as significant improved diabetes control at that time. ?Doing well on new meds. No new side effects. No urinary symptoms, no genital lesions/rash. Off insulin past few weeks.  ?Home readings: 99-100 fasting. No symptomatic lows. Lowest 66 4/8. No other low readings.  ?Postprandial: 101-112.  ?On statin, ARB.  ?Vision doing well, reading glasses only.  ? ?Wt Readings from Last 3 Encounters:  ?07/15/21 (!) 365 lb 9.6 oz (165.8 kg)  ?06/23/21 (!) 372 lb 12.8 oz (169.1 kg)  ?06/09/21 (!) 375 lb 12.8 oz (170.5 kg)  ? ?Lab Results  ?Component Value Date  ? HGBA1C 14.5 (H) 05/06/2021  ? ?Lab Results  ?Component Value Date  ? CREATININE 1.50 05/06/2021  ? ? ? ? ? ?History ?Patient Active Problem List  ? Diagnosis Date Noted  ? Diplopia   ? Ophthalmoplegia of right eye   ? CVA (cerebral vascular accident) ([place]) 03/24/2020  ? Acute CVA (cerebrovascular accident) ([place]) 03/24/2020  ? Hyperlipidemia 12/31/2016  ? PVD (peripheral vascular disease) ([place]) 01/29/2016  ? Diverticulitis 01/28/2016  ? Special screening for malignant neoplasms, colon   ? Type 2 diabetes mellitus with diabetic dermatitis, without long-term current use of insulin ([place]) 05/03/2015  ? History of deep vein thrombosis (DVT) of lower extremity 03/07/2013  ? Ischemia of extremity 02/25/2013  ? HTN (hypertension) 05/07/2011  ? Erectile dysfunction 05/07/2011  ? Obesity  05/07/2011  ? ?Past Medical History:  ?Diagnosis Date  ? Diabetes mellitus without complication ([place])   ? DVT (deep venous thrombosis) ([place]) 02/2013  ? Erectile dysfunction   ? Hyperlipidemia   ? Hypertension   ? Obesity   ? BMI >52  ? PVD (peripheral vascular disease) ([place])   ? ?Past Surgical History:  ?Procedure Laterality Date  ? ABDOMINAL AORTAGRAM N/A 02/28/2013  ? Procedure: ABDOMINAL AORTAGRAM;  Surgeon: Conrad Wilmington, MD;  Location: [facility] CATH LAB;  Service: Cardiovascular;  Laterality: N/A;  ? COLONOSCOPY WITH PROPOFOL N/A 12/25/2015  ? Procedure: COLONOSCOPY WITH PROPOFOL;  Surgeon: Manus Gunning, MD;  Location: [facility] ENDOSCOPY;  Service: Gastroenterology;  Laterality: N/A;  ? EMBOLECTOMY Left 02/25/2013  ? Procedure: Left Popliteal EMBOLECTOMY Poss fasciotomy;  Surgeon: Elam Dutch, MD;  Location: [facility] OR;  Service: Vascular;  Laterality: Left;  Left poplital and Tibial embolectomy with four compartment Fasciotomy with vein patch angioplasty left popliteal artery.  ? ?Allergies  ?Allergen Reactions  ? Morphine And Related Nausea And Vomiting  ? ?Prior to Admission medications   ?Medication Sig Start Date End Date Taking? Authorizing Provider  ?ACCU-CHEK GUIDE test strip USE TO TEST BLOOD SUGAR 3 TIMES A DAY 06/03/21  Yes Wendie Agreste, MD  ?amLODipine (NORVASC) 10 MG tablet Take 1 tablet (10 mg total) by mouth daily. 05/06/21  Yes Wendie Agreste, MD  ?atorvastatin (LIPITOR) 40 MG tablet Take 1 tablet (40 mg  total) by mouth daily. 05/06/21  Yes Wendie Agreste, MD  ?blood glucose meter kit and supplies Dispense based on patient and insurance preference. Use 3 times daily. E11.65 05/07/21  Yes Wendie Agreste, MD  ?clopidogrel (PLAVIX) 75 MG tablet TAKE 1 TABLET BY MOUTH EVERY DAY WITH BREAKFAST 05/06/21  Yes Wendie Agreste, MD  ?empagliflozin (JARDIANCE) 10 MG TABS tablet Take 1 tablet (10 mg total) by mouth daily before breakfast. 06/23/21  Yes Wendie Agreste, MD  ?hydrochlorothiazide  (HYDRODIURIL) 25 MG tablet Take 1 tablet (25 mg total) by mouth daily. 05/06/21  Yes Wendie Agreste, MD  ?insulin glargine (LANTUS SOLOSTAR) 100 UNIT/ML Solostar Pen Inject 10 Units into the skin daily. ?Patient taking differently: Inject 20 Units into the skin daily. 05/07/21  Yes Wendie Agreste, MD  ?Insulin Pen Needle (PEN NEEDLES) 32G X 4 MM MISC 1 application by Does not apply route daily. 05/07/21  Yes Wendie Agreste, MD  ?losartan (COZAAR) 25 MG tablet Take 1 tablet (25 mg total) by mouth daily. 05/06/21  Yes Wendie Agreste, MD  ?Multiple Vitamins-Minerals (MULTIVITAMIN WITH MINERALS) tablet Take 1 tablet by mouth daily.   Yes [provider]  ?sildenafil (VIAGRA) 100 MG tablet TAKE 1/2 TO 1 TABLET BY MOUTH EVERY DAY AS NEEDED FOR ERECTILE DYSFUNCTION 06/24/20  Yes Wendie Agreste, MD  ?sitaGLIPtin (JANUVIA) 100 MG tablet Take 1 tablet (100 mg total) by mouth daily. 05/26/21  Yes Wendie Agreste, MD  ? ?Social History  ? ?Socioeconomic History  ? Marital status: Married  ?  Spouse name: Carlos Phillips  ? Number of children: 2  ? Years of education: Not on file  ? Highest education level: Not on file  ?Occupational History  ? Occupation: Truck Geophysicist/field seismologist  ?Tobacco Use  ? Smoking status: Former  ?  Packs/day: 1.00  ?  Years: 30.00  ?  Pack years: 30.00  ?  Types: Cigarettes  ?  Quit date: 02/25/2013  ?  Years since quitting: 8.3  ? Smokeless tobacco: Never  ?Vaping Use  ? Vaping Use: Never used  ?Substance and Sexual Activity  ? Alcohol use: Yes  ?  Alcohol/week: 4.0 standard drinks  ?  Types: 4 Standard drinks or equivalent per week  ?  Comment: social- rare  ? Drug use: No  ? Sexual activity: Yes  ?  Partners: Female  ?Other Topics Concern  ? Not on file  ?Social History Narrative  ? Not on file  ? ?Social Determinants of Health  ? ?Financial Resource Strain: Not on file  ?Food Insecurity: Not on file  ?Transportation Needs: Not on file  ?Physical Activity: Not on file  ?Stress: Not on file  ?Social  Connections: Not on file  ?Intimate Partner Violence: Not on file  ? ? ?Review of Systems  ?Constitutional:  Negative for fatigue and unexpected weight change.  ?Eyes:  Negative for visual disturbance.  ?Respiratory:  Negative for cough, chest tightness and shortness of breath.   ?Cardiovascular:  Negative for chest pain, palpitations and leg swelling.  ?Gastrointestinal:  Negative for abdominal pain and blood in stool.  ?Neurological:  Negative for dizziness, light-headedness and headaches.  ? ? ?Objective:  ? ?Vitals:  ? 07/15/21 0941  ?BP: 128/76  ?Pulse: 75  ?Resp: 16  ?Temp: 98.3 ?F (36.8 ?C)  ?TempSrc: Temporal  ?SpO2: 97%  ?Weight: (!) 365 lb 9.6 oz (165.8 kg)  ?Height: 6' (1.829 m)  ? ? ? ?Physical Exam ?Vitals reviewed.  ?  Constitutional:   ?   Appearance: He is well-developed.  ?HENT:  ?   Head: Normocephalic and atraumatic.  ?Neck:  ?   Vascular: No carotid bruit or JVD.  ?Cardiovascular:  ?   Rate and Rhythm: Normal rate and regular rhythm.  ?   Heart sounds: Normal heart sounds. No murmur heard. ?Pulmonary:  ?   Effort: Pulmonary effort is normal.  ?   Breath sounds: Normal breath sounds. No rales.  ?Musculoskeletal:  ?   Right lower leg: No edema.  ?   Left lower leg: No edema.  ?Skin: ?   General: Skin is warm and dry.  ?Neurological:  ?   Mental Status: He is alert and oriented to person, place, and time.  ?Psychiatric:     ?   Mood and Affect: Mood normal.  ? ?Results for orders placed or performed in visit on 07/15/21  ?POCT glucose (manual entry)  ?Result Value Ref Range  ? POC Glucose 135 (A) 70 - 99 mg/dl  ? ? ?Assessment & Plan:  ?Carlos Phillips is a 56 y.o. male . ?Type 2 diabetes mellitus with hyperglycemia, without long-term current use of insulin (HCC) - Plan: POCT glucose (manual entry) ?Improved control including home readings, 1 slightly low reading but asymptomatic.  We will decrease Januvia back to 50 mg, continue Jardiance same dose.  No other changes.  Commended on improved  control.  Likely can schedule DOT physical for return to work at this time, fructosamine result given to patient to bring to the physical and lab only visit in 3 weeks for A1c.  3-monthfollow-up with me. ? ?Meds ordered this

## 2021-07-15 NOTE — Patient Instructions (Addendum)
Lab only visit for A1c in 3 weeks.  ?Decrease januvia back to '50mg'$ . Continue the jardiance same dose for now. If any low readings let me know.  ?Bring copy of fructosamine to your DOT exam, but let me know if they need more info.  ?Recheck in 3 months with me.  ?You are doing great! Take care  ?

## 2021-07-17 DIAGNOSIS — G4731 Primary central sleep apnea: Secondary | ICD-10-CM | POA: Diagnosis not present

## 2021-07-19 ENCOUNTER — Telehealth: Payer: Self-pay | Admitting: Adult Health

## 2021-07-19 ENCOUNTER — Encounter: Payer: Self-pay | Admitting: *Deleted

## 2021-07-19 NOTE — Telephone Encounter (Signed)
He may need [place] to clear him to drive from a stroke standpoint which is fine for him to do - he did not have any residual deficits at prior visit bu full DOT exam cannot be done here. Sometime they give them forms for [place] to complete or they just want [place] to send them a letter but either way is fine. Thank you.  ?

## 2021-07-19 NOTE — Telephone Encounter (Signed)
Pt states although he was told by Janett Billow, NP to go to his PCP re: the DOT physical .  Someone re: the DOT physical told him that he needed to come back to his neurologist(not his PCP) to get the ok that he is able to drive, please call. ?

## 2021-07-19 NOTE — Telephone Encounter (Signed)
Carlos Phillips, on DPR and informed her of NP's message. She stated he's had DOT exam, needs a letter to clear him to go back to driving from a neurologic standpoint. He will come to pick up the letter when it is ready. She  verbalized understanding, appreciation. ?Letter written, signed and placed at front desk for pick up. Sent my chart informing patient.  ? ?

## 2021-07-22 ENCOUNTER — Other Ambulatory Visit (INDEPENDENT_AMBULATORY_CARE_PROVIDER_SITE_OTHER): Payer: BC Managed Care – PPO

## 2021-07-22 DIAGNOSIS — E1165 Type 2 diabetes mellitus with hyperglycemia: Secondary | ICD-10-CM | POA: Diagnosis not present

## 2021-07-22 LAB — HEMOGLOBIN A1C: Hgb A1c MFr Bld: 8.5 % — ABNORMAL HIGH (ref 4.6–6.5)

## 2021-07-23 ENCOUNTER — Telehealth: Payer: Self-pay

## 2021-07-23 NOTE — Telephone Encounter (Signed)
Patient"s wife called in stating that they want to know if Carlos Phillips can stop taking Plavix. They will not allow him to have his DOT card with being on the medication. Wanting to know what they should do or if they can have him stop taking it.  ?

## 2021-07-23 NOTE — Telephone Encounter (Signed)
Unfortunately I think he needs to remain on this with history of stroke, but that can be discussed with his neurologist.  Recommend he call them with this concern. ?

## 2021-07-23 NOTE — Telephone Encounter (Signed)
Pt wife called in stating pt cannot get DOT physical while taking Plavix and pt wife has called in to ask if pt can now stop this medication or if he needs to continue this? ? ?

## 2021-07-26 NOTE — Telephone Encounter (Signed)
I discussed verbally with Carlos Phillips and she sts the pt should be ok to continue plavix since he is not on anticoagulant therapy, only plavix ( antiplatelet). ? ?I called the pt and we discussed. He sts he was advised he could not be on anticoagulant therapy but was not told the plavix was an issue. He reports the forms he has from the DOT sts he cannot be on anticoagulant therapy, plavix was not mentioned. ? ?I advised we could re-write the letter and and once reviewed and signed by NP I would call back and advise.  ?

## 2021-07-26 NOTE — Telephone Encounter (Signed)
NP reviewed the letter and signed. I called pt and advised letter is ready for pick up and placed up front at [place]. ?

## 2021-07-26 NOTE — Telephone Encounter (Signed)
Called pt and he voiced understanding will call neurology and discuss.  ?

## 2021-07-26 NOTE — Telephone Encounter (Addendum)
Pt's letter has been returned to the office and further information is needed. Pt sts for DOT clearance the letter needs to state no disqualifying deficits are present making him unsafe to drive and he cannot be on anticoagulant therapy. ? ?Will fwd to NP per last visit plan was to continue with plavix.  ?

## 2021-08-04 DIAGNOSIS — Z0271 Encounter for disability determination: Secondary | ICD-10-CM

## 2021-10-18 ENCOUNTER — Ambulatory Visit: Payer: BC Managed Care – PPO | Admitting: Family Medicine

## 2021-11-01 ENCOUNTER — Ambulatory Visit: Payer: BC Managed Care – PPO | Admitting: Family Medicine

## 2021-11-04 ENCOUNTER — Other Ambulatory Visit: Payer: Self-pay | Admitting: *Deleted

## 2021-11-04 NOTE — Patient Outreach (Signed)
  Care Coordination   11/04/2021 Name: Carlos Phillips MRN: [id number] DOB: May 19, 1965   Care Coordination Outreach Attempts:  An unsuccessful telephone outreach was attempted today to offer the patient information about available care coordination services as a benefit of their health plan.   Follow Up Plan:  Additional outreach attempts will be made to offer the patient care coordination information and services.   Encounter Outcome:  No Answer  Care Coordination Interventions Activated:  No   Care Coordination Interventions:  No, not indicated    Raina Mina, RN Care Management Coordinator [place] Office [phone]

## 2021-12-09 ENCOUNTER — Ambulatory Visit: Payer: BC Managed Care – PPO | Admitting: Family Medicine

## 2021-12-10 ENCOUNTER — Other Ambulatory Visit: Payer: Self-pay | Admitting: Family Medicine

## 2021-12-10 DIAGNOSIS — I10 Essential (primary) hypertension: Secondary | ICD-10-CM

## 2021-12-21 ENCOUNTER — Telehealth: Payer: Self-pay | Admitting: *Deleted

## 2021-12-21 NOTE — Patient Outreach (Signed)
  Care Coordination   12/21/2021 Name: Carlos Phillips MRN: [id number] DOB: 24-May-1965   Care Coordination Outreach Attempts:  A second unsuccessful outreach was attempted today to offer the patient with information about available care coordination services as a benefit of their health plan.     Follow Up Plan:  Additional outreach attempts will be made to offer the patient care coordination information and services.   Encounter Outcome:  No Answer  Care Coordination Interventions Activated:  No   Care Coordination Interventions:  No, not indicated    Raina Mina, RN Care Management Coordinator [place] Office [phone]

## 2021-12-23 ENCOUNTER — Encounter: Payer: Self-pay | Admitting: Family Medicine

## 2021-12-23 ENCOUNTER — Ambulatory Visit (INDEPENDENT_AMBULATORY_CARE_PROVIDER_SITE_OTHER): Payer: BC Managed Care – PPO | Admitting: Family Medicine

## 2021-12-23 VITALS — BP 138/80 | HR 67 | Temp 98.1°F | Ht 72.0 in | Wt 352.6 lb

## 2021-12-23 DIAGNOSIS — I10 Essential (primary) hypertension: Secondary | ICD-10-CM

## 2021-12-23 DIAGNOSIS — E1165 Type 2 diabetes mellitus with hyperglycemia: Secondary | ICD-10-CM

## 2021-12-23 DIAGNOSIS — Z23 Encounter for immunization: Secondary | ICD-10-CM | POA: Diagnosis not present

## 2021-12-23 DIAGNOSIS — I739 Peripheral vascular disease, unspecified: Secondary | ICD-10-CM | POA: Diagnosis not present

## 2021-12-23 DIAGNOSIS — E781 Pure hyperglyceridemia: Secondary | ICD-10-CM | POA: Diagnosis not present

## 2021-12-23 DIAGNOSIS — Z8673 Personal history of transient ischemic attack (TIA), and cerebral infarction without residual deficits: Secondary | ICD-10-CM | POA: Diagnosis not present

## 2021-12-23 MED ORDER — AMLODIPINE BESYLATE 10 MG PO TABS: 10.0000 mg | ORAL_TABLET | Freq: Every day | ORAL | 1 refills | Status: DC

## 2021-12-23 MED ORDER — EMPAGLIFLOZIN 10 MG PO TABS: 10.0000 mg | ORAL_TABLET | Freq: Every day | ORAL | 2 refills | Status: DC

## 2021-12-23 MED ORDER — CLOPIDOGREL BISULFATE 75 MG PO TABS: ORAL_TABLET | ORAL | 1 refills | Status: DC

## 2021-12-23 MED ORDER — ATORVASTATIN CALCIUM 40 MG PO TABS: 40.0000 mg | ORAL_TABLET | Freq: Every day | ORAL | 1 refills | Status: DC

## 2021-12-23 MED ORDER — LOSARTAN POTASSIUM 25 MG PO TABS: 25.0000 mg | ORAL_TABLET | Freq: Every day | ORAL | 1 refills | Status: DC

## 2021-12-23 MED ORDER — SITAGLIPTIN PHOSPHATE 50 MG PO TABS: 50.0000 mg | ORAL_TABLET | Freq: Every day | ORAL | 1 refills | Status: DC

## 2021-12-23 NOTE — Patient Instructions (Addendum)
Keep a record of your blood pressures outside of the office and bring them to the next office visit. If home blood pressure are over 130/80 (goal with history of stroke)- I would like to increase your meds - let me know.  No med changes today. If any concerns on labs I will let you know. Take care!

## 2021-12-23 NOTE — Progress Notes (Signed)
Subjective:  Patient ID: Carlos Phillips, male    DOB: 05-23-1965  Age: 56 y.o. MRN: [id number]  CC:  Chief Complaint  Patient presents with   Diabetes    Pt states all is well    HPI SHAIL URBAS presents for   Diabetes: With hyperglycemia, previous significant hyperglycemia requiring insulin treatment followed by continued decreases in amount of insulin needed, followed by discontinuation of insulin.  Started on Jardiance 10 mg daily in March and continued [place].  Lowest reading of 66 in April, asymptomatic. Unable to tolerate metformin.  He is on statin, ARB Home readings: No fasting readings.  After meals - 110-112.  No sx lows.   Microalbumin: Normal ratio 05/04/2020 Optho, foot exam, pneumovax:  Ophtho exam: April or May.  Flu vaccine today.    Lab Results  Component Value Date   HGBA1C 8.5 (H) 07/22/2021   HGBA1C 14.5 (H) 05/06/2021   HGBA1C 6.9 (H) 06/08/2020   Lab Results  Component Value Date   MICROALBUR 0.3 06/21/2015   LDLCALC 149 (H) 06/08/2020   CREATININE 1.50 05/06/2021   Hyperlipidemia: With PVD. Taking plavix, no new bleeding. Lipitor 68m qd, no new myalgias.  Hx of brainstem CVA in 02/2020.  No new weakness, unsteadiness or speech changes.  No new med side effects.  Still driving for work.   Lab Results  Component Value Date   CHOL 182 05/06/2021   HDL 49.10 05/06/2021   LDLCALC 149 (H) 06/08/2020   LDLDIRECT 92.0 05/06/2021   TRIG 303.0 (H) 05/06/2021   CHOLHDL 4 05/06/2021   Lab Results  Component Value Date   ALT 18 05/06/2021   AST 17 05/06/2021   ALKPHOS 99 05/06/2021   BILITOT 0.8 05/06/2021   Hypertension: Norvasc 124m losartan 2514md  Home readings:none.   BP Readings from Last 3 Encounters:  12/23/21 138/80  07/15/21 128/76  06/23/21 128/80   Lab Results  Component Value Date   CREATININE 1.50 05/06/2021         History Patient Active Problem List   Diagnosis Date Noted   Diplopia     Ophthalmoplegia of right eye    CVA (cerebral vascular accident) ([place]CC[place]2/28/2021   Acute CVA (cerebrovascular accident) ([place]CC[place]2/28/2021   Hyperlipidemia 12/31/2016   PVD (peripheral vascular disease) ([place]/05/2015   Diverticulitis 01/28/2016   Special screening for malignant neoplasms, colon    Type 2 diabetes mellitus with diabetic dermatitis, without long-term current use of insulin ([place]CC[place]2/07/2015   History of deep vein thrombosis (DVT) of lower extremity 03/07/2013   Ischemia of extremity 02/25/2013   HTN (hypertension) 05/07/2011   Erectile dysfunction 05/07/2011   Obesity 05/07/2011   Past Medical History:  Diagnosis Date   Diabetes mellitus without complication ([place]  DVT (deep venous thrombosis) ([place]CC[place]len [place]2/2014   Erectile dysfunction    Hyperlipidemia    Hypertension    Obesity    BMI >52   PVD (peripheral vascular disease) ([place]CC[place]  Past Surgical History:  Procedure Laterality Date   ABDOMINAL AORTAGRAM N/A 02/28/2013   Procedure: ABDOMINAL AORMaxcine HamSurgeon: BriConrad BurlingtonD;  [facility];  Service: Cardiovascular;  Laterality: N/A;   COLONOSCOPY WITH PROPOFOL N/A 12/25/2015   Procedure: COLONOSCOPY WITH PROPOFOL;  Surgeon: SteManus GunningD;  Location: [facility] ENDOSCOPY;  Service: Gastroenterology;  Laterality: N/A;   EMBOLECTOMY Left 02/25/2013   Procedure: Left Popliteal EMBOLECTOMY Poss fasciotomy;  Surgeon: ChaElam DutchD;  Location:  [facility] OR;  Service: Vascular;  Laterality: Left;  Left poplital and Tibial embolectomy with four compartment Fasciotomy with vein patch angioplasty left popliteal artery.   Allergies  Allergen Reactions   Morphine And Related Nausea And Vomiting   Prior to Admission medications   Medication Sig Start Date End Date Taking? Authorizing Provider  ACCU-CHEK GUIDE test strip USE TO TEST BLOOD SUGAR 3 TIMES A DAY 06/03/21  Yes Wendie Agreste, MD  amLODipine (NORVASC) 10 MG tablet Take 1 tablet (10 mg total) by mouth  daily. 05/06/21  Yes Wendie Agreste, MD  atorvastatin (LIPITOR) 40 MG tablet Take 1 tablet (40 mg total) by mouth daily. 05/06/21  Yes Wendie Agreste, MD  blood glucose meter kit and supplies Dispense based on patient and insurance preference. Use 3 times daily. E11.65 05/07/21  Yes Wendie Agreste, MD  clopidogrel (PLAVIX) 75 MG tablet TAKE 1 TABLET BY MOUTH EVERY DAY WITH BREAKFAST 05/06/21  Yes Wendie Agreste, MD  empagliflozin (JARDIANCE) 10 MG TABS tablet Take 1 tablet (10 mg total) by mouth daily before breakfast. 06/23/21  Yes Wendie Agreste, MD  hydrochlorothiazide (HYDRODIURIL) 25 MG tablet TAKE 1 TABLET (25 MG TOTAL) BY MOUTH DAILY. 12/10/21  Yes Wendie Agreste, MD  insulin glargine (LANTUS SOLOSTAR) 100 UNIT/ML Solostar Pen Inject 10 Units into the skin daily. Patient taking differently: Inject 20 Units into the skin daily. 05/07/21  Yes Wendie Agreste, MD  losartan (COZAAR) 25 MG tablet Take 1 tablet (25 mg total) by mouth daily. 05/06/21  Yes Wendie Agreste, MD  Multiple Vitamins-Minerals (MULTIVITAMIN WITH MINERALS) tablet Take 1 tablet by mouth daily.   Yes [provider]  sildenafil (VIAGRA) 100 MG tablet TAKE 1/2 TO 1 TABLET BY MOUTH EVERY DAY AS NEEDED FOR ERECTILE DYSFUNCTION 06/24/20  Yes Wendie Agreste, MD  sitaGLIPtin (JANUVIA) 50 MG tablet Take 1 tablet (50 mg total) by mouth daily. 07/15/21  Yes Wendie Agreste, MD  Insulin Pen Needle (PEN NEEDLES) 32G X 4 MM MISC 1 application by Does not apply route daily. Patient not taking: Reported on 12/23/2021 05/07/21   Wendie Agreste, MD   Social History   Socioeconomic History   Marital status: Married    Spouse name: Ivin Booty   Number of children: 2   Years of education: Not on file   Highest education level: Not on file  Occupational History   Occupation: Truck driver  Tobacco Use   Smoking status: Former    Packs/day: 1.00    Years: 30.00    Total pack years: 30.00    Types: Cigarettes    Quit  date: 02/25/2013    Years since quitting: 8.8   Smokeless tobacco: Never  Vaping Use   Vaping Use: Never used  Substance and Sexual Activity   Alcohol use: Yes    Alcohol/week: 4.0 standard drinks of alcohol    Types: 4 Standard drinks or equivalent per week    Comment: social- rare   Drug use: No   Sexual activity: Yes    Partners: Female  Other Topics Concern   Not on file  Social History Narrative   Not on file   Social Determinants of Health   Financial Resource Strain: Not on file  Food Insecurity: Not on file  Transportation Needs: Not on file  Physical Activity: Not on file  Stress: Not on file  Social Connections: Not on file  Intimate Partner Violence: Not on file  Review of Systems  Constitutional:  Negative for fatigue and unexpected weight change.  Eyes:  Negative for visual disturbance.  Respiratory:  Negative for cough, chest tightness and shortness of breath.   Cardiovascular:  Negative for chest pain, palpitations and leg swelling.  Gastrointestinal:  Negative for abdominal pain and blood in stool.  Neurological:  Negative for dizziness, light-headedness and headaches.     Objective:   Vitals:   12/23/21 1358 12/23/21 1500  BP: (!) 148/92 138/80  Pulse: 67   Temp: 98.1 F (36.7 C)   SpO2: 97%   Weight: (!) 352 lb 9.6 oz (159.9 kg)   Height: 6' (1.829 m)      Physical Exam Vitals reviewed.  Constitutional:      Appearance: He is well-developed. He is obese.  HENT:     Head: Normocephalic and atraumatic.  Neck:     Vascular: No carotid bruit or JVD.  Cardiovascular:     Rate and Rhythm: Normal rate and regular rhythm.     Heart sounds: Normal heart sounds. No murmur heard. Pulmonary:     Effort: Pulmonary effort is normal.     Breath sounds: Normal breath sounds. No rales.  Musculoskeletal:     Right lower leg: No edema.     Left lower leg: No edema.  Skin:    General: Skin is warm and dry.  Neurological:     Mental Status: He is  alert and oriented to person, place, and time.  Psychiatric:        Mood and Affect: Mood normal.        Assessment & Plan:  JARRICK FJELD is a 56 y.o. male . Type 2 diabetes mellitus with hyperglycemia, without long-term current use of insulin (HCC) - Plan: empagliflozin (JARDIANCE) 10 MG TABS tablet, sitaGLIPtin (JANUVIA) 50 MG tablet, Hemoglobin A1c, Microalbumin / creatinine urine ratio  -Tolerating current regimen, controlled by home readings, check A1c, urine microalbumin.  No med changes for now.  51-monthfollow-up  Need for influenza vaccination - Plan: Flu Vaccine QUAD 6+ mos PF IM (Fluarix Quad PF)  Essential hypertension - Plan: amLODipine (NORVASC) 10 MG tablet, losartan (COZAAR) 25 MG tablet  -Stable with current regimen, continue same, labs ordered.  Monitor for readings over 130/80, noted with adjust regimen for improved control given history of CVA.  Hypertriglyceridemia - Plan: atorvastatin (LIPITOR) 40 MG tablet, Comprehensive metabolic panel, Lipid panel  -Tolerating Lipitor, continue same with updated labs, medication adjustments accordingly.  History of CVA (cerebrovascular accident) - Plan: atorvastatin (LIPITOR) 40 MG tablet  -Goal LDL under 70 BP under 130/80.  No new symptoms.  PVD (peripheral vascular disease) ([place] - Plan: clopidogrel (PLAVIX) 75 MG tablet, Comprehensive metabolic panel, Lipid panel  -Tolerating current regimen of Plavix without new symptoms or side effects.  Continue same  No orders of the defined types were placed in this encounter.  There are no Patient Instructions on file for this visit.    Signed,   JMerri Ray MD LGwynn SShawmutGroup 12/23/21 3:01 PM

## 2021-12-24 LAB — COMPREHENSIVE METABOLIC PANEL
ALT: 19 U/L (ref 0–53)
AST: 20 U/L (ref 0–37)
Albumin: 4.4 g/dL (ref 3.5–5.2)
Alkaline Phosphatase: 66 U/L (ref 39–117)
BUN: 13 mg/dL (ref 6–23)
CO2: 26 mEq/L (ref 19–32)
Calcium: 9.7 mg/dL (ref 8.4–10.5)
Chloride: 102 mEq/L (ref 96–112)
Creatinine, Ser: 1.11 mg/dL (ref 0.40–1.50)
GFR: 73.97 mL/min (ref >60.00)
Glucose, Bld: 82 mg/dL (ref 70–99)
Potassium: 3.3 mEq/L — ABNORMAL LOW (ref 3.5–5.1)
Sodium: 140 mEq/L (ref 135–145)
Total Bilirubin: 0.6 mg/dL (ref 0.2–1.2)
Total Protein: 7.6 g/dL (ref 6.0–8.3)

## 2021-12-24 LAB — LIPID PANEL
Cholesterol: 206 mg/dL — ABNORMAL HIGH (ref 0–200)
HDL: 58.9 mg/dL (ref >39.00)
LDL Cholesterol: 125 mg/dL — ABNORMAL HIGH (ref 0–99)
NonHDL: 146.93
Total CHOL/HDL Ratio: 3
Triglycerides: 110 mg/dL (ref 0.0–149.0)
VLDL: 22 mg/dL (ref 0.0–40.0)

## 2021-12-24 LAB — MICROALBUMIN / CREATININE URINE RATIO
Creatinine,U: 100 mg/dL
Microalb Creat Ratio: 0.7 mg/g (ref 0.0–30.0)
Microalb, Ur: 0.7 mg/dL (ref 0.0–1.9)

## 2021-12-24 LAB — HEMOGLOBIN A1C: Hgb A1c MFr Bld: 6.7 % — ABNORMAL HIGH (ref 4.6–6.5)

## 2021-12-31 ENCOUNTER — Other Ambulatory Visit: Payer: Self-pay | Admitting: Family Medicine

## 2021-12-31 DIAGNOSIS — E876 Hypokalemia: Secondary | ICD-10-CM

## 2021-12-31 NOTE — Progress Notes (Signed)
Bmp for hypokalemia

## 2022-03-01 ENCOUNTER — Telehealth: Payer: Self-pay | Admitting: Adult Health

## 2022-03-01 NOTE — Telephone Encounter (Signed)
I have sent a message to [facility] team advising that last ov with the pt was in feb 2023 as well as they have access to our notes and should be able to pull that information. If there is anything else needed they can let us know.

## 2022-03-01 NOTE — Telephone Encounter (Signed)
Joe is calling from [facility]. Requesting lastest face to face order for CPAP. Fax) [phone]

## 2022-03-31 ENCOUNTER — Ambulatory Visit: Payer: BC Managed Care – PPO | Admitting: Family Medicine

## 2022-04-13 DIAGNOSIS — G4731 Primary central sleep apnea: Secondary | ICD-10-CM | POA: Diagnosis not present

## 2022-04-28 ENCOUNTER — Ambulatory Visit: Payer: BC Managed Care – PPO | Admitting: Family Medicine

## 2022-04-28 ENCOUNTER — Encounter: Payer: Self-pay | Admitting: Family Medicine

## 2022-04-28 VITALS — BP 130/72 | HR 78 | Temp 97.6°F | Ht 72.0 in | Wt 341.4 lb

## 2022-04-28 DIAGNOSIS — Z87891 Personal history of nicotine dependence: Secondary | ICD-10-CM

## 2022-04-28 DIAGNOSIS — E781 Pure hyperglyceridemia: Secondary | ICD-10-CM

## 2022-04-28 DIAGNOSIS — E876 Hypokalemia: Secondary | ICD-10-CM

## 2022-04-28 DIAGNOSIS — N529 Male erectile dysfunction, unspecified: Secondary | ICD-10-CM

## 2022-04-28 DIAGNOSIS — I10 Essential (primary) hypertension: Secondary | ICD-10-CM

## 2022-04-28 DIAGNOSIS — Z122 Encounter for screening for malignant neoplasm of respiratory organs: Secondary | ICD-10-CM

## 2022-04-28 DIAGNOSIS — I739 Peripheral vascular disease, unspecified: Secondary | ICD-10-CM

## 2022-04-28 DIAGNOSIS — Z8673 Personal history of transient ischemic attack (TIA), and cerebral infarction without residual deficits: Secondary | ICD-10-CM | POA: Diagnosis not present

## 2022-04-28 DIAGNOSIS — E1165 Type 2 diabetes mellitus with hyperglycemia: Secondary | ICD-10-CM

## 2022-04-28 MED ORDER — CLOPIDOGREL BISULFATE 75 MG PO TABS: ORAL_TABLET | ORAL | 1 refills | Status: DC

## 2022-04-28 MED ORDER — HYDROCHLOROTHIAZIDE 25 MG PO TABS: 25.0000 mg | ORAL_TABLET | Freq: Every day | ORAL | 1 refills | Status: DC

## 2022-04-28 MED ORDER — SILDENAFIL CITRATE 100 MG PO TABS: ORAL_TABLET | ORAL | 3 refills | Status: DC

## 2022-04-28 MED ORDER — FREESTYLE LIBRE 3 SENSOR MISC: 1.0000 | 2 refills | Status: DC

## 2022-04-28 MED ORDER — LOSARTAN POTASSIUM 25 MG PO TABS: 25.0000 mg | ORAL_TABLET | Freq: Every day | ORAL | 1 refills | Status: DC

## 2022-04-28 MED ORDER — EMPAGLIFLOZIN 10 MG PO TABS: 10.0000 mg | ORAL_TABLET | Freq: Every day | ORAL | 2 refills | Status: DC

## 2022-04-28 MED ORDER — SITAGLIPTIN PHOSPHATE 50 MG PO TABS: 50.0000 mg | ORAL_TABLET | Freq: Every day | ORAL | 1 refills | Status: DC

## 2022-04-28 MED ORDER — AMLODIPINE BESYLATE 10 MG PO TABS: 10.0000 mg | ORAL_TABLET | Freq: Every day | ORAL | 1 refills | Status: DC

## 2022-04-28 MED ORDER — ACCU-CHEK GUIDE VI STRP: ORAL_STRIP | 1 refills | Status: DC

## 2022-04-28 MED ORDER — ATORVASTATIN CALCIUM 40 MG PO TABS: 40.0000 mg | ORAL_TABLET | Freq: Every day | ORAL | 1 refills | Status: DC

## 2022-04-28 NOTE — Patient Instructions (Addendum)
No med changes today. If elevated blood pressures or sugars let me know, but I will let you know the results of labs and if any changes needed. Keep up the good work with weight loss. Take care.  I will refer you for lung cancer screening.  I have ordered a continuous glucose monitor but if that is not covered we can look at alternatives or you can fill the test strips for your current monitor. Follow-up in 3 months, let me know if there are questions sooner and take care.

## 2022-04-28 NOTE — Progress Notes (Signed)
Subjective:  Patient ID: Carlos Phillips, male    DOB: 12/07/1965  Age: 57 y.o. MRN: [id number]  CC:  Chief Complaint  Patient presents with   Hyperlipidemia   Hypertension   Diabetes    HPI Carlos Phillips presents for   Diabetes: With hyperglycemia, significant hyperglycemia previously requiring insulin followed by decreased insulin doses needed followed by discontinuation of insulin with improved control.  He was started on Jardiance 10 mg daily in March 2023 as well as Januvia.  He was unable to tolerate metformin.  Well-controlled on his A1c in September.  Continued on same regimen.  Januvia 50 mg daily, Jardiance 10 mg daily.  He is on statin with Lipitor 40 mg daily, ARB with losartan 25 mg daily. Home readings fasting: 120 Home readings postprandial 122 after eating.  Symptomatic lows - none.  Eating better, staying active - weight loss as below.   Microalbumin:  normal ratio 12/23/2021 Optho, foot exam, pneumovax: Ophthalmology exam due in next few months. Last year had exam. Told was ok last May.   Lung cancer screening:  prior smoker. Quit 10 years ago. 30 years, 1ppd. Agrees to referral for lung cancer screening discussion.   Shingles vaccine: declines.   Lab Results  Component Value Date   HGBA1C 6.7 (H) 12/23/2021   HGBA1C 8.5 (H) 07/22/2021   HGBA1C 14.5 (H) 05/06/2021   Lab Results  Component Value Date   MICROALBUR 0.7 12/23/2021   LDLCALC 125 (H) 12/23/2021   CREATININE 1.11 12/23/2021   Hyperlipidemia: Elevated readings in September, recommended diet/activity changes for weight management, continue same dose of Lipitor, 40 mg daily.  Weight is improved by 11 pounds since his last visit, and was improving at that time as well from prior visit. Lab Results  Component Value Date   CHOL 206 (H) 12/23/2021   HDL 58.90 12/23/2021   LDLCALC 125 (H) 12/23/2021   LDLDIRECT 92.0 05/06/2021   TRIG 110.0 12/23/2021   CHOLHDL 3 12/23/2021   Lab Results   Component Value Date   ALT 19 12/23/2021   AST 20 12/23/2021   ALKPHOS 66 12/23/2021   BILITOT 0.6 12/23/2021   Wt Readings from Last 3 Encounters:  04/28/22 (!) 341 lb 6.4 oz (154.9 kg)  12/23/21 (!) 352 lb 9.6 oz (159.9 kg)  07/15/21 (!) 365 lb 9.6 oz (165.8 kg)   Hypokalemia: Lab Results  Component Value Date   K 3.3 (L) 12/23/2021  Noted on last lab work in September.  Potassium rich foods recommended with repeat levels in a few weeks at that time.  Has not had repeat testing. No change in diet or supplements. Occasional banana.   Hypertension: No new side effects with meds. Hx of PVD, CVA. Tolerating plavix - no new bleeding.  Home readings: variable.  BP Readings from Last 3 Encounters:  04/28/22 130/72  12/23/21 138/80  07/15/21 128/76   Lab Results  Component Value Date   CREATININE 1.11 12/23/2021   Erectile dysfunction: Effective with half pill '100mg'$  in past. No HA/flushing. No CP with exertion, hearing or vision changes. Requests refill.    History Patient Active Problem List   Diagnosis Date Noted   Diplopia    Ophthalmoplegia of right eye    CVA (cerebral vascular accident) ([place]) 03/24/2020   Acute CVA (cerebrovascular accident) ([place]) 03/24/2020   Hyperlipidemia 12/31/2016   PVD (peripheral vascular disease) ([place]) 01/29/2016   Diverticulitis 01/28/2016   Special screening for malignant neoplasms, colon  Type 2 diabetes mellitus with diabetic dermatitis, without long-term current use of insulin ([place]) 05/03/2015   History of deep vein thrombosis (DVT) of lower extremity 03/07/2013   Ischemia of extremity 02/25/2013   HTN (hypertension) 05/07/2011   Erectile dysfunction 05/07/2011   Obesity 05/07/2011   Past Medical History:  Diagnosis Date   Diabetes mellitus without complication ([place])    DVT (deep venous thrombosis) ([place]) 02/2013   Erectile dysfunction    Hyperlipidemia    Hypertension    Obesity    BMI >52   PVD (peripheral vascular  disease) ([place])    Past Surgical History:  Procedure Laterality Date   ABDOMINAL AORTAGRAM N/A 02/28/2013   Procedure: ABDOMINAL Maxcine Ham;  Surgeon: Conrad Ko Olina, MD;  Location[facility] C[facility] LAB;  Service: Cardiovascular;  Laterality: N/A;   COLONOSCOPY WITH PROPOFOL N/A 12/25/2015   Procedure: COLONOSCOPY WITH PROPOFOL;  Surgeon: Manus Gunning, MD;  Location: [facility] ENDOSCOPY;  Service: Gastroenterology;  Laterality: N/A;   EMBOLECTOMY Left 02/25/2013   Procedure: Left Popliteal EMBOLECTOMY Poss fasciotomy;  Surgeon: Elam Dutch, MD;  Location: [facility] OR;  Service: Vascular;  Laterality: Left;  Left poplital and Tibial embolectomy with four compartment Fasciotomy with vein patch angioplasty left popliteal artery.   Allergies  Allergen Reactions   Morphine And Related Nausea And Vomiting   Prior to Admission medications   Medication Sig Start Date End Date Taking? Authorizing Provider  ACCU-CHEK GUIDE test strip USE TO TEST BLOOD SUGAR 3 TIMES A DAY 06/03/21  Yes Wendie Agreste, MD  amLODipine (NORVASC) 10 MG tablet Take 1 tablet (10 mg total) by mouth daily. 12/23/21  Yes Wendie Agreste, MD  atorvastatin (LIPITOR) 40 MG tablet Take 1 tablet (40 mg total) by mouth daily. 12/23/21  Yes Wendie Agreste, MD  blood glucose meter kit and supplies Dispense based on patient and insurance preference. Use 3 times daily. E11.65 05/07/21  Yes Wendie Agreste, MD  clopidogrel (PLAVIX) 75 MG tablet TAKE 1 TABLET BY MOUTH EVERY DAY WITH BREAKFAST 12/23/21  Yes Wendie Agreste, MD  empagliflozin (JARDIANCE) 10 MG TABS tablet Take 1 tablet (10 mg total) by mouth daily before breakfast. 12/23/21  Yes Wendie Agreste, MD  hydrochlorothiazide (HYDRODIURIL) 25 MG tablet TAKE 1 TABLET (25 MG TOTAL) BY MOUTH DAILY. 12/10/21  Yes Wendie Agreste, MD  insulin glargine (LANTUS SOLOSTAR) 100 UNIT/ML Solostar Pen Inject 10 Units into the skin daily. Patient taking differently: Inject 20 Units into the skin  daily. 05/07/21  Yes Wendie Agreste, MD  Insulin Pen Needle (PEN NEEDLES) 32G X 4 MM MISC 1 application by Does not apply route daily. 05/07/21  Yes Wendie Agreste, MD  losartan (COZAAR) 25 MG tablet Take 1 tablet (25 mg total) by mouth daily. 12/23/21  Yes Wendie Agreste, MD  Multiple Vitamins-Minerals (MULTIVITAMIN WITH MINERALS) tablet Take 1 tablet by mouth daily.   Yes [provider]  sildenafil (VIAGRA) 100 MG tablet TAKE 1/2 TO 1 TABLET BY MOUTH EVERY DAY AS NEEDED FOR ERECTILE DYSFUNCTION 06/24/20  Yes Wendie Agreste, MD  sitaGLIPtin (JANUVIA) 50 MG tablet Take 1 tablet (50 mg total) by mouth daily. 12/23/21  Yes Wendie Agreste, MD   Social History   Socioeconomic History   Marital status: Married    Spouse name: Ivin Booty   Number of children: 2   Years of education: Not on file   Highest education level: Not on file  Occupational History  Occupation: Truck Geophysicist/field seismologist  Tobacco Use   Smoking status: Former    Packs/day: 1.00    Years: 30.00    Total pack years: 30.00    Types: Cigarettes    Quit date: 02/25/2013    Years since quitting: 9.1   Smokeless tobacco: Never  Vaping Use   Vaping Use: Never used  Substance and Sexual Activity   Alcohol use: Yes    Alcohol/week: 4.0 standard drinks of alcohol    Types: 4 Standard drinks or equivalent per week    Comment: social- rare   Drug use: No   Sexual activity: Yes    Partners: Female  Other Topics Concern   Not on file  Social History Narrative   Not on file   Social Determinants of Health   Financial Resource Strain: Not on file  Food Insecurity: Not on file  Transportation Needs: Not on file  Physical Activity: Not on file  Stress: Not on file  Social Connections: Not on file  Intimate Partner Violence: Not on file    Review of Systems  Constitutional:  Negative for fatigue and unexpected weight change.  Eyes:  Negative for visual disturbance.  Respiratory:  Negative for cough, chest  tightness and shortness of breath.   Cardiovascular:  Negative for chest pain, palpitations and leg swelling.  Gastrointestinal:  Negative for abdominal pain and blood in stool.  Neurological:  Negative for dizziness, light-headedness and headaches.     Objective:   Vitals:   04/28/22 1450  BP: 130/72  Pulse: 78  Temp: 97.6 F (36.4 C)  TempSrc: Oral  SpO2: 98%  Weight: (!) 341 lb 6.4 oz (154.9 kg)  Height: 6' (1.829 m)     Physical Exam Vitals reviewed.  Constitutional:      Appearance: He is well-developed.  HENT:     Head: Normocephalic and atraumatic.  Neck:     Vascular: No carotid bruit or JVD.  Cardiovascular:     Rate and Rhythm: Normal rate and regular rhythm.     Heart sounds: Normal heart sounds. No murmur heard. Pulmonary:     Effort: Pulmonary effort is normal.     Breath sounds: Normal breath sounds. No rales.  Musculoskeletal:     Right lower leg: No edema.     Left lower leg: No edema.  Skin:    General: Skin is warm and dry.  Neurological:     Mental Status: He is alert and oriented to person, place, and time.  Psychiatric:        Mood and Affect: Mood normal.        Assessment & Plan:  HENRRY FEIL is a 57 y.o. male . Essential hypertension History of CVA (cerebrovascular accident) PVD (peripheral vascular disease) (HCC)  -  Stable, tolerating current regimen. Medications refilled. Labs pending as above.  Denies any new bleeding.  Hypertriglyceridemia  -  Stable, tolerating current regimen. Medications refilled. Labs pending as above.   Type 2 diabetes mellitus with hyperglycemia, without long-term current use of insulin (HCC)  -Tolerating current med regimen, check updated labs, refills provided.  Will attempt to order a continuous glucose monitor or can fill testing strips if that is not covered.  History of tobacco use, lung cancer screening discussed, he would like to meet with pulmonary to discuss that testing, referral  placed.  Meds ordered this encounter  Medications   amLODipine (NORVASC) 10 MG tablet    Sig: Take 1 tablet (10 mg total) by mouth  daily.    Dispense:  90 tablet    Refill:  1   losartan (COZAAR) 25 MG tablet    Sig: Take 1 tablet (25 mg total) by mouth daily.    Dispense:  90 tablet    Refill:  1   hydrochlorothiazide (HYDRODIURIL) 25 MG tablet    Sig: Take 1 tablet (25 mg total) by mouth daily.    Dispense:  90 tablet    Refill:  1   atorvastatin (LIPITOR) 40 MG tablet    Sig: Take 1 tablet (40 mg total) by mouth daily.    Dispense:  90 tablet    Refill:  1   clopidogrel (PLAVIX) 75 MG tablet    Sig: TAKE 1 TABLET BY MOUTH EVERY DAY WITH BREAKFAST    Dispense:  90 tablet    Refill:  1   empagliflozin (JARDIANCE) 10 MG TABS tablet    Sig: Take 1 tablet (10 mg total) by mouth daily before breakfast.    Dispense:  30 tablet    Refill:  2   sitaGLIPtin (JANUVIA) 50 MG tablet    Sig: Take 1 tablet (50 mg total) by mouth daily.    Dispense:  90 tablet    Refill:  1   glucose blood (ACCU-CHEK GUIDE) test strip    Sig: USE TO TEST BLOOD SUGAR  once per day.    Dispense:  100 strip    Refill:  1   Continuous Blood Gluc Sensor (FREESTYLE LIBRE 3 SENSOR) MISC    Sig: 1 Application by Does not apply route every 14 (fourteen) days. Place 1 sensor on the skin every 14 days. Use to check glucose continuously    Dispense:  6 each    Refill:  2   sildenafil (VIAGRA) 100 MG tablet    Sig: TAKE 1/2 TO 1 TABLET BY MOUTH EVERY DAY AS NEEDED FOR ERECTILE DYSFUNCTION    Dispense:  4 tablet    Refill:  3   Patient Instructions  No med changes today. If elevated blood pressures or sugars let me know, but I will let you know the results of labs and if any changes needed. Keep up the good work with weight loss. Take care.  I will refer you for lung cancer screening.  I have ordered a continuous glucose monitor but if that is not covered we can look at alternatives or you can fill the test  strips for your current monitor. Follow-up in 3 months, let me know if there are questions sooner and take care.      Signed,   [name], MD [place] 04/28/22 3:51 PM

## 2022-05-14 DIAGNOSIS — G4731 Primary central sleep apnea: Secondary | ICD-10-CM | POA: Diagnosis not present

## 2022-05-23 ENCOUNTER — Encounter: Payer: Self-pay | Admitting: Family Medicine

## 2022-05-24 ENCOUNTER — Telehealth: Payer: Self-pay | Admitting: Family Medicine

## 2022-05-24 NOTE — Telephone Encounter (Signed)
Caller name: Kriste Basque  On DPR?: Yes  Call back number: [phone]  Provider they see: Wendie Agreste, MD  Reason for call: Lovena Le states Dr Carlota Raspberry requested a DM eye exam but after a year of multiple attempts he promises and never makes appt.

## 2022-05-24 NOTE — Telephone Encounter (Signed)
Okay 

## 2022-05-25 DIAGNOSIS — G4731 Primary central sleep apnea: Secondary | ICD-10-CM | POA: Diagnosis not present

## 2022-06-12 DIAGNOSIS — G4731 Primary central sleep apnea: Secondary | ICD-10-CM | POA: Diagnosis not present

## 2022-06-23 DIAGNOSIS — G4731 Primary central sleep apnea: Secondary | ICD-10-CM | POA: Diagnosis not present

## 2022-07-20 IMAGING — MR MR HEAD WO/W CM
6 of 12 series · 26 of 48 positions shown · IV contrast (gadavist)
Comparison: CT head June 27, 2001.

CLINICAL DATA: Weak right eye adduction. Opthalmoplegia. Diploplia.
History of diabetes, DVT, hyperlipidemia, hypertension, obesity,
peripheral vascular disease.

EXAM:
MRI HEAD WITHOUT AND WITH CONTRAST
TECHNIQUE: Multiplanar, multiecho pulse sequences of the brain and surrounding
structures were obtained without and with intravenous contrast.
CONTRAST:  10mL GADAVIST GADOBUTROL 1 MMOL/ML IV SOLN

[Series 2: DWI · axial · 3.0mm · 0.94mm/px · z∈[-91,+56]mm · 9 of 99 slices shown (1 of 2)]
[im 1/99]
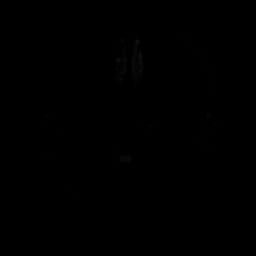
[im 13/99]
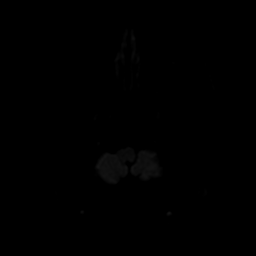
[im 25/99]
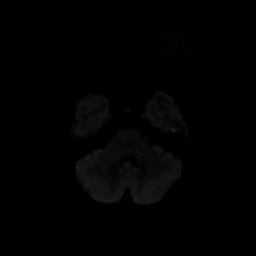
[im 37/99]
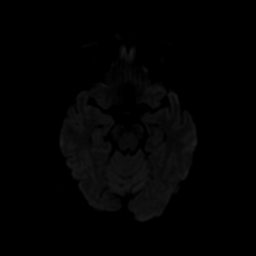
[im 50/99]
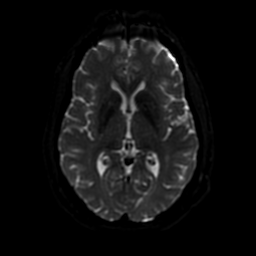
[im 62/99]
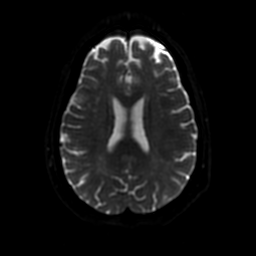
[im 74/99]
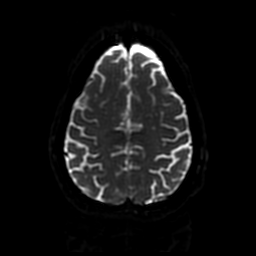
[im 86/99]
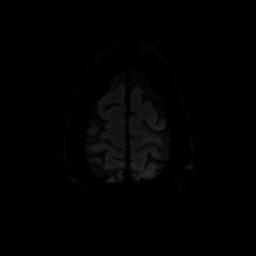
[im 99/99]
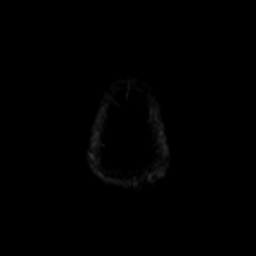

[Series 3: DWI · coronal · 4.0mm · 0.94mm/px · 6 of 74 slices shown (2 of 2)]
[im 1/74]
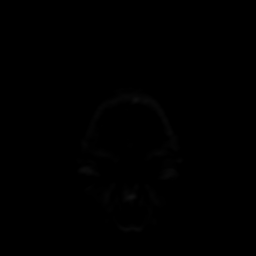
[im 15/74]
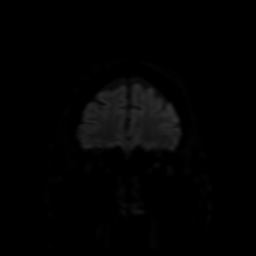
[im 30/74]
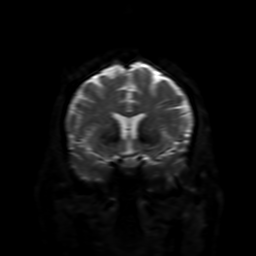
[im 44/74]
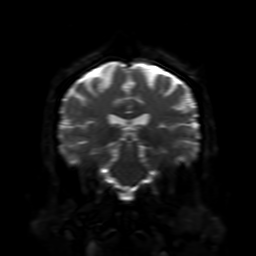
[im 59/74]
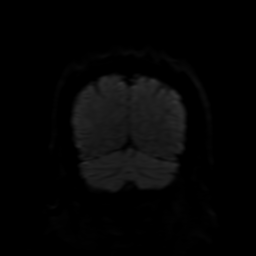
[im 74/74]
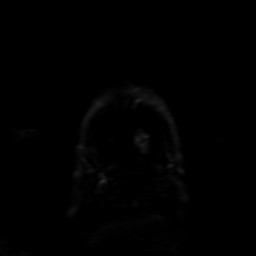

[Series 4: FLAIR · sagittal · 5.0mm · 0.23mm/px · 2 of 23 slices shown (1 of 2)]
[im 1/23]
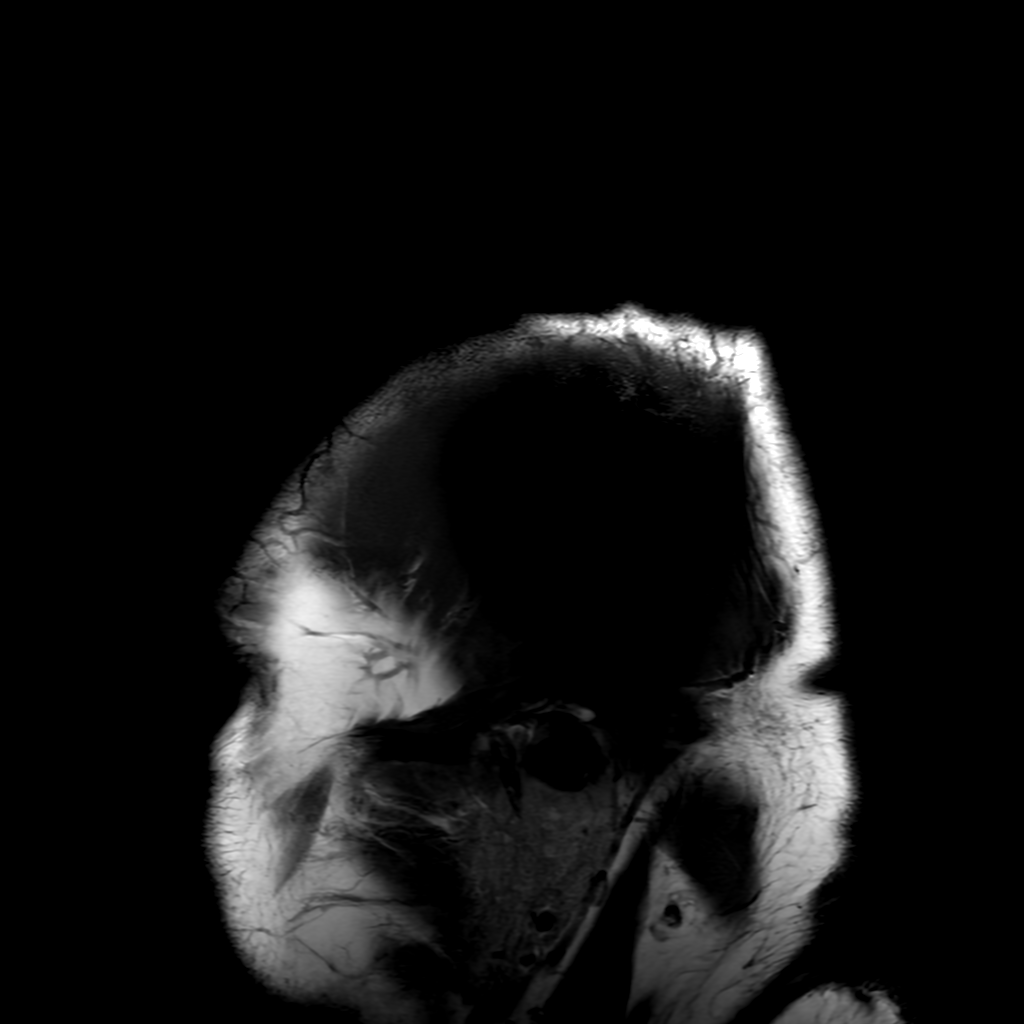
[im 23/23]
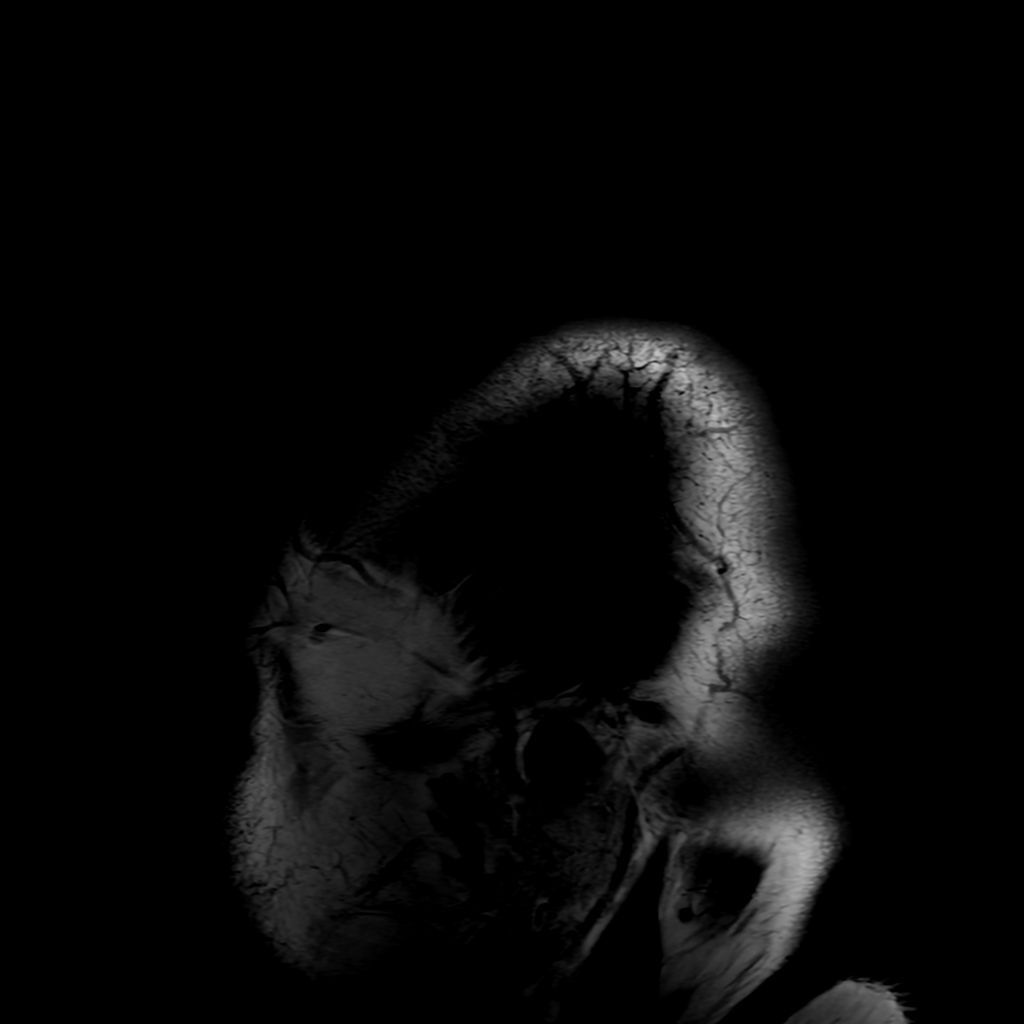

[Series 6: FLAIR · axial · 3.0mm · 0.45mm/px · z∈[-86,+52]mm · 2 of 24 slices shown (2 of 2)]
[im 1/24]
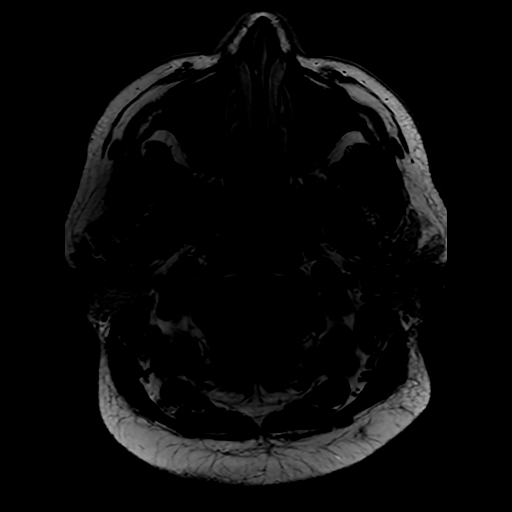
[im 24/24]
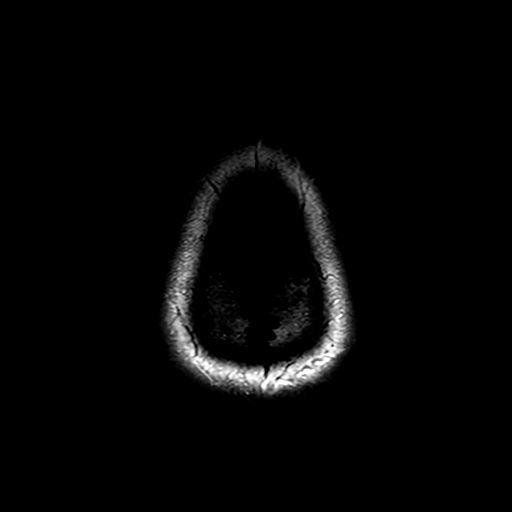

[Series 250: ADC · axial · 3.0mm · 0.94mm/px · z∈[-91,+56]mm · 4 of 50 slices shown (1 of 2)]
[im 1/50]
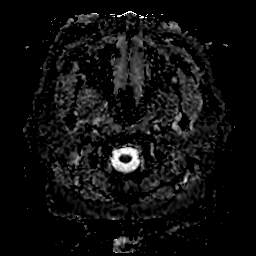
[im 17/50]
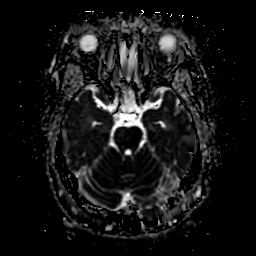
[im 33/50]
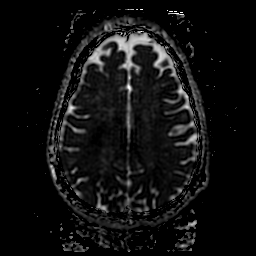
[im 50/50]
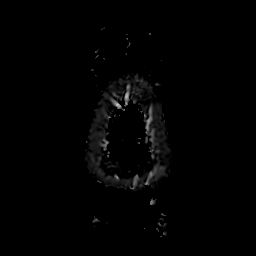

[Series 350: ADC · coronal · 4.0mm · 0.94mm/px · 3 of 37 slices shown (2 of 2)]
[im 1/37]
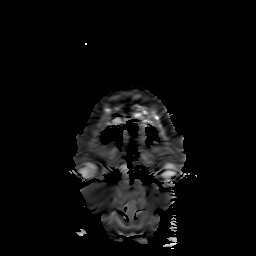
[im 19/37]
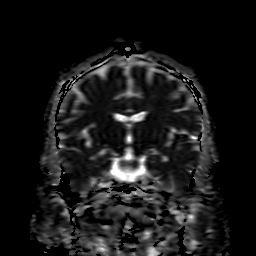
[im 37/37]
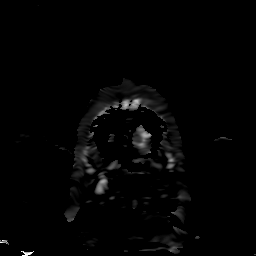

[26 of 48 positions shown; findings below may reference images not displayed]

FINDINGS: Brain: Small focus of restricted diffusion in the midbrain anterior
to the cerebral aqueduct at the level of the superior colliculus. No
associated enhancement. Scattered T2/FLAIR hyperintensities within
the white matter, which are advanced in number for age. No abnormal
enhancement. Small remote lacunar infarct in the right thalamus.

Vascular: Major arterial flow voids are maintained at the skull
base. No acute hemorrhage. No hydrocephalus. No mass lesion. No
abnormal enhancement.

Skull and upper cervical spine: Normal marrow signal.

Sinuses/Orbits: Mild scattered paranasal sinus mucosal thickening
without air-fluid levels. Unremarkable orbits.

Other: Trace bilateral mastoid effusions.
IMPRESSION: 1. Small acute infarct in the midbrain, in the expected region of
the right oculomotor nucleus. No substantial edema or mass effect.
2. Age advanced T2/FLAIR hyperintensities within the white matter,
most likely related to chronic microvascular ischemic disease given
the patient's reported risk factors. Additional differential
considerations include prior demyelination, trauma, inflammation, or
migraines.
3. Small remote lacunar infarct in the right thalamus.

## 2022-07-28 ENCOUNTER — Ambulatory Visit: Payer: BC Managed Care – PPO | Admitting: Family Medicine

## 2022-08-15 ENCOUNTER — Ambulatory Visit (INDEPENDENT_AMBULATORY_CARE_PROVIDER_SITE_OTHER): Payer: BC Managed Care – PPO | Admitting: Family Medicine

## 2022-08-15 ENCOUNTER — Encounter: Payer: Self-pay | Admitting: Family Medicine

## 2022-08-15 VITALS — BP 130/88 | HR 85 | Temp 98.2°F | Ht 72.0 in | Wt 339.2 lb

## 2022-08-15 DIAGNOSIS — I10 Essential (primary) hypertension: Secondary | ICD-10-CM | POA: Diagnosis not present

## 2022-08-15 DIAGNOSIS — E1165 Type 2 diabetes mellitus with hyperglycemia: Secondary | ICD-10-CM

## 2022-08-15 DIAGNOSIS — Z8673 Personal history of transient ischemic attack (TIA), and cerebral infarction without residual deficits: Secondary | ICD-10-CM

## 2022-08-15 DIAGNOSIS — E781 Pure hyperglyceridemia: Secondary | ICD-10-CM | POA: Diagnosis not present

## 2022-08-15 DIAGNOSIS — I739 Peripheral vascular disease, unspecified: Secondary | ICD-10-CM

## 2022-08-15 DIAGNOSIS — Z7984 Long term (current) use of oral hypoglycemic drugs: Secondary | ICD-10-CM | POA: Diagnosis not present

## 2022-08-15 MED ORDER — AMLODIPINE BESYLATE 10 MG PO TABS: 10.0000 mg | ORAL_TABLET | Freq: Every day | ORAL | 1 refills | Status: DC

## 2022-08-15 MED ORDER — EMPAGLIFLOZIN 10 MG PO TABS: 10.0000 mg | ORAL_TABLET | Freq: Every day | ORAL | 2 refills | Status: DC

## 2022-08-15 MED ORDER — HYDROCHLOROTHIAZIDE 25 MG PO TABS: 25.0000 mg | ORAL_TABLET | Freq: Every day | ORAL | 1 refills | Status: DC

## 2022-08-15 MED ORDER — ATORVASTATIN CALCIUM 40 MG PO TABS: 40.0000 mg | ORAL_TABLET | Freq: Every day | ORAL | 1 refills | Status: DC

## 2022-08-15 MED ORDER — SITAGLIPTIN PHOSPHATE 50 MG PO TABS: 50.0000 mg | ORAL_TABLET | Freq: Every day | ORAL | 1 refills | Status: DC

## 2022-08-15 MED ORDER — LOSARTAN POTASSIUM 25 MG PO TABS: 25.0000 mg | ORAL_TABLET | Freq: Every day | ORAL | 1 refills | Status: DC

## 2022-08-15 MED ORDER — CLOPIDOGREL BISULFATE 75 MG PO TABS: ORAL_TABLET | ORAL | 1 refills | Status: DC

## 2022-08-15 NOTE — Progress Notes (Unsigned)
Subjective:  Patient ID: Carlos Phillips, male    DOB: [birth year]  Age: 57 y.o. 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     HPI Carlos Phillips presents for    Diabetes: With hyperglycemia.  Well-controlled A1c in September of last year, initially had been on insulin for hyperglycemia with decreasing doses then off insulin, unable to tolerate metformin.  Was continued on Januvia 50 mg daily, Jardiance 10 mg daily statin with Lipitor 40 mg daily, ARB with losartan 25 mg daily.  Has been losing weight, eating better and that his last visit with me in February. Glycosuria on recent U/a for DOT physical  Home readings: Fasting 114.  Postprandial lows.  No symptomatic lows. Lowest 95.    Microalbumin: 12/23/21.  Optho, foot exam, pneumovax:  Eye exam: due - he will schedule.  Foot exam today.  Shingrix - declines.   Covid vaccine - declines.  Diabetic Foot Exam - Simple   Simple Foot Form Diabetic Foot exam was performed with the following findings: Yes 08/15/2022  4:16 PM  Visual Inspection No deformities, no ulcerations, no other skin breakdown bilaterally: Yes See comments: Yes Sensation Testing Intact to touch and monofilament testing bilaterally: Yes Pulse Check Comments Dry skin, no wounds.      Lab Results  Component Value Date   HGBA1C 6.7 (H) 12/23/2021   HGBA1C 8.5 (H) 07/22/2021   HGBA1C 14.5 (H) 05/06/2021   Lab Results  Component Value Date   MICROALBUR 0.7 12/23/2021   LDLCALC 125 (H) 12/23/2021   CREATININE 1.11 12/23/2021   Hyperlipidemia: Lipitor 40mg  qd. No new myalgias/side effects.  Hx of CVA.  No residual weakness from CVA. Prior balance issues, resolved few weeks later. No residual symptoms form CVA at this time.  Lab Results   Component Value Date   CHOL 206 (H) 12/23/2021   HDL 58.90 12/23/2021   LDLCALC 125 (H) 12/23/2021   LDLDIRECT 92.0 05/06/2021   TRIG 110.0 12/23/2021   CHOLHDL 3 12/23/2021   Lab Results  Component Value Date   ALT 19 12/23/2021   AST 20 12/23/2021   ALKPHOS 66 12/23/2021   BILITOT 0.6 12/23/2021   Hypertension: With history of peripheral vascular disease, history of CVA.  Treated with Plavix. Remote history of DVT in 2014. DOT physical on 5/15 - needs letter regarding elevated BP - 170/90, 172/101, and letter regarding prior DVT, and updated A1C.  Takes hctz 25mg  qd, losartan 25mg  qd, amlodipine 10mg  qd,  Home readings: some elevated readings - up to 170 at times, over 102.  BP Readings from Last 3 Encounters:  08/15/22 130/88  04/28/22 130/72  12/23/21 138/80   Lab Results  Component Value Date   CREATININE 1.11 12/23/2021   History Patient Active Problem List   Diagnosis Date Noted   Diplopia    Ophthalmoplegia of right eye    CVA (cerebral vascular accident) (HCC) 03/24/2020   Acute CVA (cerebrovascular accident) (HCC) 03/24/2020   Hyperlipidemia 12/31/2016   PVD (peripheral vascular disease) (HCC) 01/29/2016   Diverticulitis 01/28/2016   Special screening for malignant neoplasms, colon    Type 2 diabetes mellitus with diabetic dermatitis, without long-term current use of insulin (HCC) 05/03/2015   History of deep vein thrombosis (DVT)  of lower extremity 03/07/2013   Ischemia of extremity 02/25/2013   HTN (hypertension) 05/07/2011   Erectile dysfunction 05/07/2011   Obesity 05/07/2011   Past Medical History:  Diagnosis Date   Diabetes mellitus without complication (HCC)    DVT (deep venous thrombosis) (HCC) 02/2013   Erectile dysfunction    Hyperlipidemia    Hypertension    Obesity    BMI >52   PVD (peripheral vascular disease) (HCC)    Past Surgical History:  Procedure Laterality Date   ABDOMINAL AORTAGRAM N/A 02/28/2013   Procedure: ABDOMINAL  Ronny Flurry;  Surgeon: Fransisco Hertz, MD;  Location: [facility] CATH LAB;  Service: Cardiovascular;  Laterality: N/A;   COLONOSCOPY WITH PROPOFOL N/A 12/25/2015   Procedure: COLONOSCOPY WITH PROPOFOL;  Surgeon: Ruffin Frederick, MD;  Location: [facility] ENDOSCOPY;  Service: Gastroenterology;  Laterality: N/A;   EMBOLECTOMY Left 02/25/2013   Procedure: Left Popliteal EMBOLECTOMY Poss fasciotomy;  Surgeon: Sherren Kerns, MD;  Location[facility] OR;  Service: Vascular;  Laterality: Left;  Left poplital and Tibial embolectomy with four compartment Fasciotomy with vein patch angioplasty left popliteal artery.   Allergies  Allergen Reactions   Morphine And Codeine Nausea And Vomiting   Prior to Admission medications   Medication Sig Start Date End Date Taking? Authorizing Provider  amLODipine (NORVASC) 10 MG tablet Take 1 tablet (10 mg total) by mouth daily. 04/28/22  Yes Shade Flood, MD  atorvastatin (LIPITOR) 40 MG tablet Take 1 tablet (40 mg total) by mouth daily. 04/28/22  Yes Shade Flood, MD  clopidogrel (PLAVIX) 75 MG tablet TAKE 1 TABLET BY MOUTH EVERY DAY WITH BREAKFAST 04/28/22  Yes Shade Flood, MD  Continuous Blood Gluc Sensor (FREESTYLE LIBRE 3 SENSOR) MISC 1 Application by Does not apply route every 14 (fourteen) days. Place 1 sensor on the skin every 14 days. Use to check glucose continuously 04/28/22  Yes Shade Flood, MD  empagliflozin (JARDIANCE) 10 MG TABS tablet Take 1 tablet (10 mg total) by mouth daily before breakfast. 04/28/22  Yes Shade Flood, MD  glucose blood (ACCU-CHEK GUIDE) test strip USE TO TEST BLOOD SUGAR  once per day. 04/28/22  Yes Shade Flood, MD  hydrochlorothiazide (HYDRODIURIL) 25 MG tablet Take 1 tablet (25 mg total) by mouth daily. 04/28/22  Yes Shade Flood, MD  losartan (COZAAR) 25 MG tablet Take 1 tablet (25 mg total) by mouth daily. 04/28/22  Yes Shade Flood, MD  Multiple Vitamins-Minerals (MULTIVITAMIN WITH MINERALS) tablet Take 1 tablet by mouth  daily.   Yes [provider]  sildenafil (VIAGRA) 100 MG tablet TAKE 1/2 TO 1 TABLET BY MOUTH EVERY DAY AS NEEDED FOR ERECTILE DYSFUNCTION 04/28/22  Yes Shade Flood, MD  sitaGLIPtin (JANUVIA) 50 MG tablet Take 1 tablet (50 mg total) by mouth daily. 04/28/22  Yes Shade Flood, MD  blood glucose meter kit and supplies Dispense based on patient and insurance preference. Use 3 times daily. E11.65 05/07/21   Shade Flood, MD   Social History   Socioeconomic History   Marital status: Married    Spouse name: Jasmine December   Number of children: 2   Years of education: Not on file   Highest education level: Not on file  Occupational History   Occupation: Truck driver  Tobacco Use   Smoking status: Former    Packs/day: 1.00    Years: 30.00    Additional pack years: 0.00    Total pack years: 30.00  Types: Cigarettes    Quit date: 02/25/2013    Years since quitting: 9.4   Smokeless tobacco: Never  Vaping Use   Vaping Use: Never used  Substance and Sexual Activity   Alcohol use: Yes    Alcohol/week: 4.0 standard drinks of alcohol    Types: 4 Standard drinks or equivalent per week    Comment: social- rare   Drug use: No   Sexual activity: Yes    Partners: Female  Other Topics Concern   Not on file  Social History Narrative   Not on file   Social Determinants of Health   Financial Resource Strain: Not on file  Food Insecurity: Not on file  Transportation Needs: Not on file  Physical Activity: Not on file  Stress: Not on file  Social Connections: Not on file  Intimate Partner Violence: Not on file    Review of Systems  Constitutional:  Negative for fatigue and unexpected weight change.  Eyes:  Negative for visual disturbance.  Respiratory:  Negative for cough, chest tightness and shortness of breath.   Cardiovascular:  Negative for chest pain, palpitations and leg swelling.  Gastrointestinal:  Negative for abdominal pain and blood in stool.  Neurological:   Negative for dizziness, light-headedness and headaches.     Objective:   Vitals:   08/15/22 1522  BP: 130/88  Pulse: 85  Temp: 98.2 F (36.8 C)  TempSrc: Oral  SpO2: 100%  Weight: (!) 339 lb 3.2 oz (153.9 kg)  Height: 6' (1.829 m)     Physical Exam Vitals reviewed.  Constitutional:      Appearance: He is well-developed.  HENT:     Head: Normocephalic and atraumatic.  Neck:     Vascular: No carotid bruit or JVD.  Cardiovascular:     Rate and Rhythm: Normal rate and regular rhythm.     Heart sounds: Normal heart sounds. No murmur heard. Pulmonary:     Effort: Pulmonary effort is normal.     Breath sounds: Normal breath sounds. No rales.  Musculoskeletal:     Right lower leg: No edema.     Left lower leg: No edema.  Skin:    General: Skin is warm and dry.  Neurological:     Mental Status: He is alert and oriented to person, place, and time.  Psychiatric:        Mood and Affect: Mood normal.        Assessment & Plan:  Carlos Phillips is a 57 y.o. male . Essential hypertension  Hypertriglyceridemia  Type 2 diabetes mellitus with hyperglycemia, without long-term current use of insulin (HCC)   No orders of the defined types were placed in this encounter.  There are no Patient Instructions on file for this visit.    Signed,   Meredith Staggers, MD [facility], [facility] 08/15/22 3:45 PM

## 2022-08-15 NOTE — Patient Instructions (Addendum)
Make eye specialist appointment and I need the letter or results from your eye specialist for diabetic retinopathy screening.  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  No change in medications for now.  You will continue to have sugar in the urine due to your medication for diabetes.  Take care!

## 2022-08-16 ENCOUNTER — Encounter: Payer: Self-pay | Admitting: Family Medicine

## 2022-08-16 LAB — COMPREHENSIVE METABOLIC PANEL
ALT: 13 U/L (ref 0–53)
AST: 18 U/L (ref 0–37)
Albumin: 4.5 g/dL (ref 3.5–5.2)
Alkaline Phosphatase: 67 U/L (ref 39–117)
BUN: 13 mg/dL (ref 6–23)
CO2: 27 mEq/L (ref 19–32)
Calcium: 10.4 mg/dL (ref 8.4–10.5)
Chloride: 97 mEq/L (ref 96–112)
Creatinine, Ser: 1.26 mg/dL (ref 0.40–1.50)
GFR: 63.25 mL/min (ref >60.00)
Glucose, Bld: 100 mg/dL — ABNORMAL HIGH (ref 70–99)
Potassium: 4 mEq/L (ref 3.5–5.1)
Sodium: 135 mEq/L (ref 135–145)
Total Bilirubin: 0.7 mg/dL (ref 0.2–1.2)
Total Protein: 7.7 g/dL (ref 6.0–8.3)

## 2022-08-16 LAB — LIPID PANEL
Cholesterol: 148 mg/dL (ref 0–200)
HDL: 60.1 mg/dL (ref >39.00)
LDL Cholesterol: 65 mg/dL (ref 0–99)
NonHDL: 88.38
Total CHOL/HDL Ratio: 2
Triglycerides: 115 mg/dL (ref 0.0–149.0)
VLDL: 23 mg/dL (ref 0.0–40.0)

## 2022-08-16 LAB — HEMOGLOBIN A1C: Hgb A1c MFr Bld: 6.3 % (ref 4.6–6.5)

## 2022-08-17 ENCOUNTER — Telehealth: Payer: Self-pay | Admitting: Family Medicine

## 2022-08-17 NOTE — Telephone Encounter (Signed)
Pt was trying to pick up today.

## 2022-08-17 NOTE — Telephone Encounter (Signed)
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  Please let him know that has been completed.

## 2022-08-17 NOTE — Telephone Encounter (Signed)
Patient has been informed and is on his way to pick up form

## 2022-08-17 NOTE — Telephone Encounter (Signed)
Carlos Phillips is on the phone letter not in bin next to [facility] office.

## 2022-09-05 ENCOUNTER — Telehealth: Payer: Self-pay

## 2022-09-05 NOTE — Telephone Encounter (Signed)
Left vm for patient asking that he call back and let us know where he had his diabetic eye exam at so that I can obtain those records.   [facility] doesn't haven't him listed as a patient there and the last one he has had was in 2017, I called that office to confirm.

## 2022-09-14 ENCOUNTER — Ambulatory Visit: Payer: BC Managed Care – PPO | Admitting: Family Medicine

## 2023-02-15 ENCOUNTER — Ambulatory Visit: Payer: BC Managed Care – PPO | Admitting: Family Medicine

## 2023-03-02 ENCOUNTER — Ambulatory Visit: Payer: BC Managed Care – PPO | Admitting: Family Medicine

## 2023-03-02 ENCOUNTER — Encounter: Payer: Self-pay | Admitting: Family Medicine

## 2023-03-02 VITALS — BP 136/80 | HR 81 | Temp 99.1°F | Ht 72.0 in | Wt 333.6 lb

## 2023-03-02 DIAGNOSIS — E1165 Type 2 diabetes mellitus with hyperglycemia: Secondary | ICD-10-CM

## 2023-03-02 DIAGNOSIS — Z1159 Encounter for screening for other viral diseases: Secondary | ICD-10-CM | POA: Diagnosis not present

## 2023-03-02 DIAGNOSIS — Z23 Encounter for immunization: Secondary | ICD-10-CM | POA: Diagnosis not present

## 2023-03-02 DIAGNOSIS — Z7984 Long term (current) use of oral hypoglycemic drugs: Secondary | ICD-10-CM

## 2023-03-02 DIAGNOSIS — I10 Essential (primary) hypertension: Secondary | ICD-10-CM

## 2023-03-02 DIAGNOSIS — E781 Pure hyperglyceridemia: Secondary | ICD-10-CM | POA: Diagnosis not present

## 2023-03-02 DIAGNOSIS — Z8673 Personal history of transient ischemic attack (TIA), and cerebral infarction without residual deficits: Secondary | ICD-10-CM

## 2023-03-02 DIAGNOSIS — I739 Peripheral vascular disease, unspecified: Secondary | ICD-10-CM

## 2023-03-02 DIAGNOSIS — N529 Male erectile dysfunction, unspecified: Secondary | ICD-10-CM

## 2023-03-02 LAB — COMPREHENSIVE METABOLIC PANEL
ALT: 29 U/L (ref 0–53)
AST: 33 U/L (ref 0–37)
Albumin: 4.2 g/dL (ref 3.5–5.2)
Alkaline Phosphatase: 69 U/L (ref 39–117)
BUN: 16 mg/dL (ref 6–23)
CO2: 29 meq/L (ref 19–32)
Calcium: 9.5 mg/dL (ref 8.4–10.5)
Chloride: 103 meq/L (ref 96–112)
Creatinine, Ser: 1.17 mg/dL (ref 0.40–1.50)
GFR: 68.87 mL/min (ref >60.00)
Glucose, Bld: 107 mg/dL — ABNORMAL HIGH (ref 70–99)
Potassium: 4.3 meq/L (ref 3.5–5.1)
Sodium: 138 meq/L (ref 135–145)
Total Bilirubin: 0.8 mg/dL (ref 0.2–1.2)
Total Protein: 7.1 g/dL (ref 6.0–8.3)

## 2023-03-02 LAB — HEMOGLOBIN A1C: Hgb A1c MFr Bld: 6.7 % — ABNORMAL HIGH (ref 4.6–6.5)

## 2023-03-02 LAB — LIPID PANEL
Cholesterol: 185 mg/dL (ref 0–200)
HDL: 57.6 mg/dL (ref >39.00)
LDL Cholesterol: 111 mg/dL — ABNORMAL HIGH (ref 0–99)
NonHDL: 127.26
Total CHOL/HDL Ratio: 3
Triglycerides: 82 mg/dL (ref 0.0–149.0)
VLDL: 16.4 mg/dL (ref 0.0–40.0)

## 2023-03-02 MED ORDER — CLOPIDOGREL BISULFATE 75 MG PO TABS: ORAL_TABLET | ORAL | 1 refills | Status: DC

## 2023-03-02 MED ORDER — SITAGLIPTIN PHOSPHATE 50 MG PO TABS: 50.0000 mg | ORAL_TABLET | Freq: Every day | ORAL | 1 refills | Status: DC

## 2023-03-02 MED ORDER — ATORVASTATIN CALCIUM 40 MG PO TABS: 40.0000 mg | ORAL_TABLET | Freq: Every day | ORAL | 1 refills | Status: DC

## 2023-03-02 MED ORDER — LOSARTAN POTASSIUM 25 MG PO TABS: 25.0000 mg | ORAL_TABLET | Freq: Every day | ORAL | 1 refills | Status: DC

## 2023-03-02 MED ORDER — AMLODIPINE BESYLATE 10 MG PO TABS: 10.0000 mg | ORAL_TABLET | Freq: Every day | ORAL | 1 refills | Status: DC

## 2023-03-02 MED ORDER — SILDENAFIL CITRATE 100 MG PO TABS: ORAL_TABLET | ORAL | 3 refills | Status: DC

## 2023-03-02 MED ORDER — EMPAGLIFLOZIN 10 MG PO TABS: 10.0000 mg | ORAL_TABLET | Freq: Every day | ORAL | 2 refills | Status: DC

## 2023-03-02 MED ORDER — HYDROCHLOROTHIAZIDE 25 MG PO TABS: 25.0000 mg | ORAL_TABLET | Freq: Every day | ORAL | 1 refills | Status: DC

## 2023-03-02 MED ORDER — CVS GLUCOSE METER TEST STRIPS VI STRP: ORAL_STRIP | 12 refills | Status: DC

## 2023-03-02 NOTE — Progress Notes (Signed)
Subjective:  Patient ID: Carlos Phillips, male    DOB: Feb 02, 1966  Age: 57 y.o. MRN: [id number]  CC:  Chief Complaint  Patient presents with   Medical Management of Chronic Issues    Pt is doing well no concerns     HPI LEVORN ELSMORE presents for  Follow up.  No health changes or concerns.  Same job.   Diabetes: With hyperglycemia, well-controlled A1c in May.  Had been on insulin for hyperglycemia with decreasing doses and then off insulin with improved control.  He was unable to tolerate metformin.  Continued on Januvia 50 mg daily, Jardiance 10 mg daily and he is on statin with Lipitor, ARB with losartan. No urinary or mycotic infection sx's. No side effects with meds.  Weight had been improving with diet changes. Home readings - fasting 110-112.  No sx lows.  Postprandial - 120 range.  Microalbumin: Due, previously in September 2023. Optho, foot exam, pneumovax:  Plans to schedule optho appt.  Flu vaccine - today 1st Shingrix today.  Hep c screen - agrees to testing today.  Covid booster - recommended at pharmacy.   Immunization History  Administered Date(s) Administered   Influenza,inj,Quad PF,6+ Mos 04/02/2020, 05/06/2021, 12/23/2021   PFIZER(Purple Top)SARS-COV-2 Vaccination 10/31/2019, 12/07/2019   Pneumococcal Conjugate-13 04/02/2020   Tdap 08/05/2013    Wt Readings from Last 3 Encounters:  03/02/23 (!) 333 lb 9.6 oz (151.3 kg)  08/15/22 (!) 339 lb 3.2 oz (153.9 kg)  04/28/22 (!) 341 lb 6.4 oz (154.9 kg)    Lab Results  Component Value Date   HGBA1C 6.3 08/15/2022   HGBA1C 6.7 (H) 12/23/2021   HGBA1C 8.5 (H) 07/22/2021   Lab Results  Component Value Date   MICROALBUR 0.7 12/23/2021   LDLCALC 65 08/15/2022   CREATININE 1.26 08/15/2022   Hyperlipidemia: Lipitor 40 mg daily without new myalgias/side effects.  History of CVA.  No residual weakness. No new weakness or calf swelling.  Lab Results  Component Value Date   CHOL 148 08/15/2022   HDL  60.10 08/15/2022   LDLCALC 65 08/15/2022   LDLDIRECT 92.0 05/06/2021   TRIG 115.0 08/15/2022   CHOLHDL 2 08/15/2022   Lab Results  Component Value Date   ALT 13 08/15/2022   AST 18 08/15/2022   ALKPHOS 67 08/15/2022   BILITOT 0.7 08/15/2022   Hypertension: With history of peripheral vascular disease, history of CVA as above, treated with Plavix.  Remote history of DVT in 2014.  Hypertension treated with hydrochlorothiazide, losartan, amlodipine. Home readings: none. No added salt. Rare fast food. Mostly cooking at home.  BP Readings from Last 3 Encounters:  03/02/23 136/80  08/15/22 130/88  04/28/22 130/72   Lab Results  Component Value Date   CREATININE 1.26 08/15/2022   Erectile dysfunction Taking 1/2 of 100mg  viagra prior - effective. No ha/flushing. No side effects. No vision/hearing changes or CP/DOE.    History Patient Active Problem List   Diagnosis Date Noted   Diplopia    Ophthalmoplegia of right eye    CVA (cerebral vascular accident) (HCC) 03/24/2020   Acute CVA (cerebrovascular accident) (HCC) 03/24/2020   Hyperlipidemia 12/31/2016   PVD (peripheral vascular disease) (HCC) 01/29/2016   Diverticulitis 01/28/2016   Special screening for malignant neoplasms, colon    Type 2 diabetes mellitus with diabetic dermatitis, without long-term current use of insulin (HCC) 05/03/2015   History of deep vein thrombosis (DVT) of lower extremity 03/07/2013   Ischemia of extremity 02/25/2013  HTN (hypertension) 05/07/2011   Erectile dysfunction 05/07/2011   Obesity 05/07/2011   Past Medical History:  Diagnosis Date   Diabetes mellitus without complication (HCC)    DVT (deep venous thrombosis) (HCC) 02/2013   Erectile dysfunction    Hyperlipidemia    Hypertension    Obesity    BMI >52   PVD (peripheral vascular disease) [facility])    Past Surgical History:  Procedure Laterality Date   ABDOMINAL AORTAGRAM N/A 02/28/2013   Procedure: ABDOMINAL Ronny Flurry;  Surgeon:  Fransisco Hertz, MD;  Location: [facility] CATH LAB;  Service: Cardiovascular;  Laterality: N/A;   COLONOSCOPY WITH PROPOFOL N/A 12/25/2015   Procedure: COLONOSCOPY WITH PROPOFOL;  Surgeon: Ruffin Frederick, MD;  Location: [facility] ENDOSCOPY;  Service: Gastroenterology;  Laterality: N/A;   EMBOLECTOMY Left 02/25/2013   Procedure: Left Popliteal EMBOLECTOMY Poss fasciotomy;  Surgeon: Sherren Kerns, MD;  Location: [facility] OR;  Service: Vascular;  Laterality: Left;  Left poplital and Tibial embolectomy with four compartment Fasciotomy with vein patch angioplasty left popliteal artery.   Allergies  Allergen Reactions   Morphine And Codeine Nausea And Vomiting   Prior to Admission medications   Medication Sig Start Date End Date Taking? Authorizing Provider  amLODipine (NORVASC) 10 MG tablet Take 1 tablet (10 mg total) by mouth daily. 08/15/22  Yes Shade Flood, MD  atorvastatin (LIPITOR) 40 MG tablet Take 1 tablet (40 mg total) by mouth daily. 08/15/22  Yes Shade Flood, MD  blood glucose meter kit and supplies Dispense based on patient and insurance preference. Use 3 times daily. E11.65 05/07/21  Yes Shade Flood, MD  clopidogrel (PLAVIX) 75 MG tablet TAKE 1 TABLET BY MOUTH EVERY DAY WITH BREAKFAST 08/15/22  Yes Shade Flood, MD  Continuous Blood Gluc Sensor (FREESTYLE LIBRE 3 SENSOR) MISC 1 Application by Does not apply route every 14 (fourteen) days. Place 1 sensor on the skin every 14 days. Use to check glucose continuously 04/28/22  Yes Shade Flood, MD  empagliflozin (JARDIANCE) 10 MG TABS tablet Take 1 tablet (10 mg total) by mouth daily before breakfast. 08/15/22  Yes Shade Flood, MD  glucose blood (ACCU-CHEK GUIDE) test strip USE TO TEST BLOOD SUGAR  once per day. 04/28/22  Yes Shade Flood, MD  hydrochlorothiazide (HYDRODIURIL) 25 MG tablet Take 1 tablet (25 mg total) by mouth daily. 08/15/22  Yes Shade Flood, MD  losartan (COZAAR) 25 MG tablet Take 1 tablet (25 mg total)  by mouth daily. 08/15/22  Yes Shade Flood, MD  Multiple Vitamins-Minerals (MULTIVITAMIN WITH MINERALS) tablet Take 1 tablet by mouth daily.   Yes [provider]  sildenafil (VIAGRA) 100 MG tablet TAKE 1/2 TO 1 TABLET BY MOUTH EVERY DAY AS NEEDED FOR ERECTILE DYSFUNCTION 04/28/22  Yes Shade Flood, MD  sitaGLIPtin (JANUVIA) 50 MG tablet Take 1 tablet (50 mg total) by mouth daily. 08/15/22  Yes Shade Flood, MD   Social History   Socioeconomic History   Marital status: Married    Spouse name: Jasmine December   Number of children: 2   Years of education: Not on file   Highest education level: Not on file  Occupational History   Occupation: Truck driver  Tobacco Use   Smoking status: Former    Current packs/day: 0.00    Average packs/day: 1 pack/day for 30.0 years (30.0 ttl pk-yrs)    Types: Cigarettes    Start date: 02/26/1983    Quit date: 02/25/2013  Years since quitting: 10.0   Smokeless tobacco: Never  Vaping Use   Vaping status: Never Used  Substance and Sexual Activity   Alcohol use: Yes    Alcohol/week: 4.0 standard drinks of alcohol    Types: 4 Standard drinks or equivalent per week    Comment: social- rare   Drug use: No   Sexual activity: Yes    Partners: Female  Other Topics Concern   Not on file  Social History Narrative   Not on file   Social Determinants of Health   Financial Resource Strain: Not on file  Food Insecurity: Not on file  Transportation Needs: Not on file  Physical Activity: Not on file  Stress: Not on file  Social Connections: Not on file  Intimate Partner Violence: Not on file    Review of Systems  Constitutional:  Negative for fatigue and unexpected weight change.  Eyes:  Negative for visual disturbance.  Respiratory:  Negative for cough, chest tightness and shortness of breath.   Cardiovascular:  Negative for chest pain, palpitations and leg swelling.  Gastrointestinal:  Negative for abdominal pain and blood in stool.   Neurological:  Negative for dizziness, light-headedness and headaches.     Objective:   Vitals:   03/02/23 1134  BP: 136/80  Pulse: 81  Temp: 99.1 F (37.3 C)  TempSrc: Temporal  SpO2: 99%  Weight: (!) 333 lb 9.6 oz (151.3 kg)  Height: 6' (1.829 m)     Physical Exam Vitals reviewed.  Constitutional:      Appearance: He is well-developed.  HENT:     Head: Normocephalic and atraumatic.  Neck:     Vascular: No carotid bruit or JVD.  Cardiovascular:     Rate and Rhythm: Normal rate and regular rhythm.     Heart sounds: Normal heart sounds. No murmur heard. Pulmonary:     Effort: Pulmonary effort is normal.     Breath sounds: Normal breath sounds. No rales.  Musculoskeletal:     Right lower leg: No edema.     Left lower leg: No edema.  Skin:    General: Skin is warm and dry.  Neurological:     Mental Status: He is alert and oriented to person, place, and time.  Psychiatric:        Mood and Affect: Mood normal.        Assessment & Plan:  ALEXES LANNIN is a 57 y.o. male . Hypertriglyceridemia - Plan: Comprehensive metabolic panel, Lipid panel, atorvastatin (LIPITOR) 40 MG tablet  -On current med regimen, check labs and adjust plan accordingly  Type 2 diabetes mellitus with hyperglycemia, without long-term current use of insulin (HCC) - Plan: Comprehensive metabolic panel, Hemoglobin A1c, empagliflozin (JARDIANCE) 10 MG TABS tablet, sitaGLIPtin (JANUVIA) 50 MG tablet  -Tolerating current meds as above, continue to watch diet, activity, check A1c and adjust plan accordingly.  Need for influenza vaccination - Plan: Flu vaccine trivalent PF, 6mos and older(Flulaval,Afluria,Fluarix,Fluzone), CANCELED: Flu vaccine trivalent PF, 6mos and older(Flulaval,Afluria,Fluarix,Fluzone)  Need for hepatitis C screening test - Plan: Hepatitis C Antibody  Need for shingles vaccine - Plan: Varicella-zoster vaccine IM, CANCELED: Varicella-zoster vaccine IM  Essential  hypertension - Plan: amLODipine (NORVASC) 10 MG tablet, hydrochlorothiazide (HYDRODIURIL) 25 MG tablet, losartan (COZAAR) 25 MG tablet History of CVA (cerebrovascular accident) - Plan: atorvastatin (LIPITOR) 40 MG tablet PVD (peripheral vascular disease) (HCC) - Plan: clopidogrel (PLAVIX) 75 MG tablet  -Borderline BP control, handout given on salty 6 foods to avoid as well  as management of hypertension with RTC precautions, no med changes for now.  Erectile dysfunction, unspecified erectile dysfunction type - Plan: sildenafil (VIAGRA) 100 MG tablet  - viagra Rx given - use lowest effective dose. Side effects discussed (including but not limited to headache/flushing, blue discoloration of vision, possible vascular steal and risk of cardiac effects if underlying unknown coronary artery disease, and permanent sensorineural hearing loss). Understanding expressed.   Meds ordered this encounter  Medications   amLODipine (NORVASC) 10 MG tablet    Sig: Take 1 tablet (10 mg total) by mouth daily.    Dispense:  90 tablet    Refill:  1   atorvastatin (LIPITOR) 40 MG tablet    Sig: Take 1 tablet (40 mg total) by mouth daily.    Dispense:  90 tablet    Refill:  1   clopidogrel (PLAVIX) 75 MG tablet    Sig: TAKE 1 TABLET BY MOUTH EVERY DAY WITH BREAKFAST    Dispense:  90 tablet    Refill:  1   glucose blood ([employer] GLUCOSE METER TEST STRIPS) test strip    Sig: Use as instructed once per day.    Dispense:  100 each    Refill:  12    Dispense for patients brand of meter.   empagliflozin (JARDIANCE) 10 MG TABS tablet    Sig: Take 1 tablet (10 mg total) by mouth daily before breakfast.    Dispense:  30 tablet    Refill:  2   hydrochlorothiazide (HYDRODIURIL) 25 MG tablet    Sig: Take 1 tablet (25 mg total) by mouth daily.    Dispense:  90 tablet    Refill:  1   losartan (COZAAR) 25 MG tablet    Sig: Take 1 tablet (25 mg total) by mouth daily.    Dispense:  90 tablet    Refill:  1   sitaGLIPtin  (JANUVIA) 50 MG tablet    Sig: Take 1 tablet (50 mg total) by mouth daily.    Dispense:  90 tablet    Refill:  1   sildenafil (VIAGRA) 100 MG tablet    Sig: TAKE 1/2 TO 1 TABLET BY MOUTH EVERY DAY AS NEEDED FOR ERECTILE DYSFUNCTION    Dispense:  4 tablet    Refill:  3   Patient Instructions  Thank you for coming in today.  Congratulations on the weight loss, keep up the good work.  No medication changes at this time.  If any concerns on labs I will let you know.  Follow-up in 6 months but I am happy to see sooner if needed.    Information below on managing high blood pressure.  Your numbers were on the higher side of normal today.  No medication changes for now unless you have continued elevated readings at home.  Also see the handout for some foods that are higher in sodium that can also affect your blood pressure.  Try to avoid those if possible.  Please follow-up if you have continued elevated readings at home, otherwise I will see you in 6 months.  Take care.   Managing Your Hypertension Hypertension, also called high blood pressure, is when the force of the blood pressing against the walls of the arteries is too strong. Arteries are blood vessels that carry blood from your heart throughout your body. Hypertension forces the heart to work harder to pump blood and may cause the arteries to become narrow or stiff. Understanding blood pressure readings A blood pressure  reading includes a higher number over a lower number: The first, or top, number is called the systolic pressure. It is a measure of the pressure in your arteries as your heart beats. The second, or bottom number, is called the diastolic pressure. It is a measure of the pressure in your arteries as the heart relaxes. For most people, a normal blood pressure is below 120/80. Your personal target blood pressure may vary depending on your medical conditions, your age, and other factors. Blood pressure is classified into four stages.  Based on your blood pressure reading, your health care provider may use the following stages to determine what type of treatment you need, if any. Systolic pressure and diastolic pressure are measured in a unit called millimeters of mercury (mmHg). Normal Systolic pressure: below 120. Diastolic pressure: below 80. Elevated Systolic pressure: 120-129. Diastolic pressure: below 80. Hypertension stage 1 Systolic pressure: 130-139. Diastolic pressure: 80-89. Hypertension stage 2 Systolic pressure: 140 or above. Diastolic pressure: 90 or above. How can this condition affect me? Managing your hypertension is very important. Over time, hypertension can damage the arteries and decrease blood flow to parts of the body, including the brain, heart, and kidneys. Having untreated or uncontrolled hypertension can lead to: A heart attack. A stroke. A weakened blood vessel (aneurysm). Heart failure. Kidney damage. Eye damage. Memory and concentration problems. Vascular dementia. What actions can I take to manage this condition? Hypertension can be managed by making lifestyle changes and possibly by taking medicines. Your health care provider will help you make a plan to bring your blood pressure within a normal range. You may be referred for counseling on a healthy diet and physical activity. Nutrition  Eat a diet that is high in fiber and potassium, and low in salt (sodium), added sugar, and fat. An example eating plan is called the DASH diet. DASH stands for Dietary Approaches to Stop Hypertension. To eat this way: Eat plenty of fresh fruits and vegetables. Try to fill one-half of your plate at each meal with fruits and vegetables. Eat whole grains, such as whole-wheat pasta, brown rice, or whole-grain bread. Fill about one-fourth of your plate with whole grains. Eat low-fat dairy products. Avoid fatty cuts of meat, processed or cured meats, and poultry with skin. Fill about one-fourth of your plate  with lean proteins such as fish, chicken without skin, beans, eggs, and tofu. Avoid pre-made and processed foods. These tend to be higher in sodium, added sugar, and fat. Reduce your daily sodium intake. Many people with hypertension should eat less than 1,500 mg of sodium a day. Lifestyle  Work with your health care provider to maintain a healthy body weight or to lose weight. Ask what an ideal weight is for you. Get at least 30 minutes of exercise that causes your heart to beat faster (aerobic exercise) most days of the week. Activities may include walking, swimming, or biking. Include exercise to strengthen your muscles (resistance exercise), such as weight lifting, as part of your weekly exercise routine. Try to do these types of exercises for 30 minutes at least 3 days a week. Do not use any products that contain nicotine or tobacco. These products include cigarettes, chewing tobacco, and vaping devices, such as e-cigarettes. If you need help quitting, ask your health care provider. Control any long-term (chronic) conditions you have, such as high cholesterol or diabetes. Identify your sources of stress and find ways to manage stress. This may include meditation, deep breathing, or making time for  fun activities. Alcohol use Do not drink alcohol if: Your health care provider tells you not to drink. You are pregnant, may be pregnant, or are planning to become pregnant. If you drink alcohol: Limit how much you have to: 0-1 drink a day for women. 0-2 drinks a day for men. Know how much alcohol is in your drink. In the [place] one drink equals one 12 oz bottle of beer (355 mL), one 5 oz glass of wine (148 mL), or one 1 oz glass of hard liquor (44 mL). Medicines Your health care provider may prescribe medicine if lifestyle changes are not enough to get your blood pressure under control and if: Your systolic blood pressure is 130 or higher. Your diastolic blood pressure is 80 or higher. Take  medicines only as told by your health care provider. Follow the directions carefully. Blood pressure medicines must be taken as told by your health care provider. The medicine does not work as well when you skip doses. Skipping doses also puts you at risk for problems. Monitoring Before you monitor your blood pressure: Do not smoke, drink caffeinated beverages, or exercise within 30 minutes before taking a measurement. Use the bathroom and empty your bladder (urinate). Sit quietly for at least 5 minutes before taking measurements. Monitor your blood pressure at home as told by your health care provider. To do this: Sit with your back straight and supported. Place your feet flat on the floor. Do not cross your legs. Support your arm on a flat surface, such as a table. Make sure your upper arm is at heart level. Each time you measure, take two or three readings one minute apart and record the results. You may also need to have your blood pressure checked regularly by your health care provider. General information Talk with your health care provider about your diet, exercise habits, and other lifestyle factors that may be contributing to hypertension. Review all the medicines you take with your health care provider because there may be side effects or interactions. Keep all follow-up visits. Your health care provider can help you create and adjust your plan for managing your high blood pressure. Where to find more information National Heart, Lung, and Blood Institute: PopSteam.is American Heart Association: www.heart.org Contact a health care provider if: You think you are having a reaction to medicines you have taken. You have repeated (recurrent) headaches. You feel dizzy. You have swelling in your ankles. You have trouble with your vision. Get help right away if: You develop a severe headache or confusion. You have unusual weakness or numbness, or you feel faint. You have severe  pain in your chest or abdomen. You vomit repeatedly. You have trouble breathing. These symptoms may be an emergency. Get help right away. Call 911. Do not wait to see if the symptoms will go away. Do not drive yourself to the hospital. Summary Hypertension is when the force of blood pumping through your arteries is too strong. If this condition is not controlled, it may put you at risk for serious complications. Your personal target blood pressure may vary depending on your medical conditions, your age, and other factors. For most people, a normal blood pressure is less than 120/80. Hypertension is managed by lifestyle changes, medicines, or both. Lifestyle changes to help manage hypertension include losing weight, eating a healthy, low-sodium diet, exercising more, stopping smoking, and limiting alcohol. This information is not intended to replace advice given to you by your health care provider. Make sure you  discuss any questions you have with your health care provider. Document Revised: 11/26/2020 Document Reviewed: 11/26/2020 Elsevier Patient Education  2024 [employer].     Signed,   Meredith Staggers, MD [facility], [facility] 03/02/23 11:56 AM

## 2023-03-02 NOTE — Patient Instructions (Signed)
Thank you for coming in today.  Congratulations on the weight loss, keep up the good work.  No medication changes at this time.  If any concerns on labs I will let you know.  Follow-up in 6 months but I am happy to see sooner if needed.    Information below on managing high blood pressure.  Your numbers were on the higher side of normal today.  No medication changes for now unless you have continued elevated readings at home.  Also see the handout for some foods that are higher in sodium that can also affect your blood pressure.  Try to avoid those if possible.  Please follow-up if you have continued elevated readings at home, otherwise I will see you in 6 months.  Take care.   Managing Your Hypertension Hypertension, also called high blood pressure, is when the force of the blood pressing against the walls of the arteries is too strong. Arteries are blood vessels that carry blood from your heart throughout your body. Hypertension forces the heart to work harder to pump blood and may cause the arteries to become narrow or stiff. Understanding blood pressure readings A blood pressure reading includes a higher number over a lower number: The first, or top, number is called the systolic pressure. It is a measure of the pressure in your arteries as your heart beats. The second, or bottom number, is called the diastolic pressure. It is a measure of the pressure in your arteries as the heart relaxes. For most people, a normal blood pressure is below 120/80. Your personal target blood pressure may vary depending on your medical conditions, your age, and other factors. Blood pressure is classified into four stages. Based on your blood pressure reading, your health care provider may use the following stages to determine what type of treatment you need, if any. Systolic pressure and diastolic pressure are measured in a unit called millimeters of mercury (mmHg). Normal Systolic pressure: below 120. Diastolic  pressure: below 80. Elevated Systolic pressure: 120-129. Diastolic pressure: below 80. Hypertension stage 1 Systolic pressure: 130-139. Diastolic pressure: 80-89. Hypertension stage 2 Systolic pressure: 140 or above. Diastolic pressure: 90 or above. How can this condition affect me? Managing your hypertension is very important. Over time, hypertension can damage the arteries and decrease blood flow to parts of the body, including the brain, heart, and kidneys. Having untreated or uncontrolled hypertension can lead to: A heart attack. A stroke. A weakened blood vessel (aneurysm). Heart failure. Kidney damage. Eye damage. Memory and concentration problems. Vascular dementia. What actions can I take to manage this condition? Hypertension can be managed by making lifestyle changes and possibly by taking medicines. Your health care provider will help you make a plan to bring your blood pressure within a normal range. You may be referred for counseling on a healthy diet and physical activity. Nutrition  Eat a diet that is high in fiber and potassium, and low in salt (sodium), added sugar, and fat. An example eating plan is called the DASH diet. DASH stands for Dietary Approaches to Stop Hypertension. To eat this way: Eat plenty of fresh fruits and vegetables. Try to fill one-half of your plate at each meal with fruits and vegetables. Eat whole grains, such as whole-wheat pasta, brown rice, or whole-grain bread. Fill about one-fourth of your plate with whole grains. Eat low-fat dairy products. Avoid fatty cuts of meat, processed or cured meats, and poultry with skin. Fill about one-fourth of your plate with lean proteins  such as fish, chicken without skin, beans, eggs, and tofu. Avoid pre-made and processed foods. These tend to be higher in sodium, added sugar, and fat. Reduce your daily sodium intake. Many people with hypertension should eat less than 1,500 mg of sodium a  day. Lifestyle  Work with your health care provider to maintain a healthy body weight or to lose weight. Ask what an ideal weight is for you. Get at least 30 minutes of exercise that causes your heart to beat faster (aerobic exercise) most days of the week. Activities may include walking, swimming, or biking. Include exercise to strengthen your muscles (resistance exercise), such as weight lifting, as part of your weekly exercise routine. Try to do these types of exercises for 30 minutes at least 3 days a week. Do not use any products that contain nicotine or tobacco. These products include cigarettes, chewing tobacco, and vaping devices, such as e-cigarettes. If you need help quitting, ask your health care provider. Control any long-term (chronic) conditions you have, such as high cholesterol or diabetes. Identify your sources of stress and find ways to manage stress. This may include meditation, deep breathing, or making time for fun activities. Alcohol use Do not drink alcohol if: Your health care provider tells you not to drink. You are pregnant, may be pregnant, or are planning to become pregnant. If you drink alcohol: Limit how much you have to: 0-1 drink a day for women. 0-2 drinks a day for men. Know how much alcohol is in your drink. In the [place] one drink equals one 12 oz bottle of beer (355 mL), one 5 oz glass of wine (148 mL), or one 1 oz glass of hard liquor (44 mL). Medicines Your health care provider may prescribe medicine if lifestyle changes are not enough to get your blood pressure under control and if: Your systolic blood pressure is 130 or higher. Your diastolic blood pressure is 80 or higher. Take medicines only as told by your health care provider. Follow the directions carefully. Blood pressure medicines must be taken as told by your health care provider. The medicine does not work as well when you skip doses. Skipping doses also puts you at risk for  problems. Monitoring Before you monitor your blood pressure: Do not smoke, drink caffeinated beverages, or exercise within 30 minutes before taking a measurement. Use the bathroom and empty your bladder (urinate). Sit quietly for at least 5 minutes before taking measurements. Monitor your blood pressure at home as told by your health care provider. To do this: Sit with your back straight and supported. Place your feet flat on the floor. Do not cross your legs. Support your arm on a flat surface, such as a table. Make sure your upper arm is at heart level. Each time you measure, take two or three readings one minute apart and record the results. You may also need to have your blood pressure checked regularly by your health care provider. General information Talk with your health care provider about your diet, exercise habits, and other lifestyle factors that may be contributing to hypertension. Review all the medicines you take with your health care provider because there may be side effects or interactions. Keep all follow-up visits. Your health care provider can help you create and adjust your plan for managing your high blood pressure. Where to find more information National Heart, Lung, and Blood Institute: [employer]is American Heart Association: www.heart.org Contact a health care provider if: You think you are having a reaction  to medicines you have taken. You have repeated (recurrent) headaches. You feel dizzy. You have swelling in your ankles. You have trouble with your vision. Get help right away if: You develop a severe headache or confusion. You have unusual weakness or numbness, or you feel faint. You have severe pain in your chest or abdomen. You vomit repeatedly. You have trouble breathing. These symptoms may be an emergency. Get help right away. Call 911. Do not wait to see if the symptoms will go away. Do not drive yourself to the hospital. Summary Hypertension  is when the force of blood pumping through your arteries is too strong. If this condition is not controlled, it may put you at risk for serious complications. Your personal target blood pressure may vary depending on your medical conditions, your age, and other factors. For most people, a normal blood pressure is less than 120/80. Hypertension is managed by lifestyle changes, medicines, or both. Lifestyle changes to help manage hypertension include losing weight, eating a healthy, low-sodium diet, exercising more, stopping smoking, and limiting alcohol. This information is not intended to replace advice given to you by your health care provider. Make sure you discuss any questions you have with your health care provider. Document Revised: 11/26/2020 Document Reviewed: 11/26/2020 Elsevier Patient Education  2024 [employer].

## 2023-03-03 LAB — HEPATITIS C ANTIBODY: Hepatitis C Ab: NONREACTIVE

## 2023-03-16 ENCOUNTER — Telehealth: Payer: Self-pay

## 2023-03-16 NOTE — Telephone Encounter (Signed)
-----   Message from Shade Flood sent at 03/14/2023  4:52 PM EST ----- Results sent by MyChart, but appears patient has not yet reviewed those results.  Please call and make sure they have either seen note or discuss result note.  Thanks.

## 2023-03-17 NOTE — Telephone Encounter (Signed)
Called patient to discuss lab work, no answer, left a message for the patient to call back to discuss or review by MyChart if that is prefered.   LAB NOTE: Hepatitis C test was nonreactive or normal.  Blood sugar few points elevated but other electrolytes, kidney, liver test looked okay.  7-month blood sugar test increased slightly but no med changes at level of 6.7.  LDL cholesterol (bad cholesterol) slightly higher, but no med changes for now.  Watch diet, exercise and recheck levels in the next 3 to 6 months.  Let me know if you have questions.   Dr. Neva Seat

## 2023-03-20 NOTE — Telephone Encounter (Signed)
Called patient to discuss lab work, no answer, left a message for the patient to call back to discuss or review by MyChart if that is prefered.  Sent letter with results to inform the patient

## 2023-08-14 ENCOUNTER — Telehealth: Payer: Self-pay | Admitting: Adult Health

## 2023-08-14 ENCOUNTER — Ambulatory Visit: Admitting: Family Medicine

## 2023-08-14 VITALS — BP 142/80 | HR 74 | Temp 98.3°F | Ht 71.0 in | Wt 332.2 lb

## 2023-08-14 DIAGNOSIS — Z0000 Encounter for general adult medical examination without abnormal findings: Secondary | ICD-10-CM | POA: Diagnosis not present

## 2023-08-14 DIAGNOSIS — Z8673 Personal history of transient ischemic attack (TIA), and cerebral infarction without residual deficits: Secondary | ICD-10-CM | POA: Diagnosis not present

## 2023-08-14 DIAGNOSIS — E781 Pure hyperglyceridemia: Secondary | ICD-10-CM

## 2023-08-14 DIAGNOSIS — N529 Male erectile dysfunction, unspecified: Secondary | ICD-10-CM | POA: Diagnosis not present

## 2023-08-14 DIAGNOSIS — E1165 Type 2 diabetes mellitus with hyperglycemia: Secondary | ICD-10-CM

## 2023-08-14 DIAGNOSIS — Z23 Encounter for immunization: Secondary | ICD-10-CM | POA: Diagnosis not present

## 2023-08-14 DIAGNOSIS — Z125 Encounter for screening for malignant neoplasm of prostate: Secondary | ICD-10-CM | POA: Diagnosis not present

## 2023-08-14 DIAGNOSIS — G4733 Obstructive sleep apnea (adult) (pediatric): Secondary | ICD-10-CM

## 2023-08-14 DIAGNOSIS — I10 Essential (primary) hypertension: Secondary | ICD-10-CM

## 2023-08-14 DIAGNOSIS — Z122 Encounter for screening for malignant neoplasm of respiratory organs: Secondary | ICD-10-CM

## 2023-08-14 DIAGNOSIS — Z1211 Encounter for screening for malignant neoplasm of colon: Secondary | ICD-10-CM

## 2023-08-14 DIAGNOSIS — I739 Peripheral vascular disease, unspecified: Secondary | ICD-10-CM

## 2023-08-14 LAB — MICROALBUMIN / CREATININE URINE RATIO
Creatinine,U: 44.2 mg/dL
Microalb Creat Ratio: UNDETERMINED mg/g (ref 0.0–30.0)
Microalb, Ur: <0.7 mg/dL

## 2023-08-14 MED ORDER — EMPAGLIFLOZIN 10 MG PO TABS: 10.0000 mg | ORAL_TABLET | Freq: Every day | ORAL | 2 refills | Status: AC

## 2023-08-14 MED ORDER — AMLODIPINE BESYLATE 10 MG PO TABS: 10.0000 mg | ORAL_TABLET | Freq: Every day | ORAL | 1 refills | Status: AC

## 2023-08-14 MED ORDER — CLOPIDOGREL BISULFATE 75 MG PO TABS
ORAL_TABLET | ORAL | 1 refills | Status: AC
Start: 2023-08-14 — End: ?

## 2023-08-14 MED ORDER — LOSARTAN POTASSIUM 25 MG PO TABS: 25.0000 mg | ORAL_TABLET | Freq: Every day | ORAL | 1 refills | Status: AC

## 2023-08-14 MED ORDER — SILDENAFIL CITRATE 100 MG PO TABS: ORAL_TABLET | ORAL | 3 refills | Status: DC

## 2023-08-14 MED ORDER — CVS GLUCOSE METER TEST STRIPS VI STRP
ORAL_STRIP | 12 refills | Status: AC
Start: 2023-08-14 — End: ?

## 2023-08-14 MED ORDER — SITAGLIPTIN PHOSPHATE 50 MG PO TABS: 50.0000 mg | ORAL_TABLET | Freq: Every day | ORAL | 1 refills | Status: AC

## 2023-08-14 MED ORDER — ATORVASTATIN CALCIUM 40 MG PO TABS: 40.0000 mg | ORAL_TABLET | Freq: Every day | ORAL | 1 refills | Status: AC

## 2023-08-14 MED ORDER — HYDROCHLOROTHIAZIDE 25 MG PO TABS
25.0000 mg | ORAL_TABLET | Freq: Every day | ORAL | 1 refills | Status: AC
Start: 2023-08-14 — End: ?

## 2023-08-14 NOTE — Patient Instructions (Addendum)
 You are overdue for follow up with your sleep specialist. They can discuss options. If you feel like you do not have sleep apnea after losing weight, they can discuss repeat testing. 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   Call and schedule eye specialist visit for diabetic eye exam and have them send me that report.   I recommend healthy meals during the day, not just one meal per day. Keep up the good work with healthy foods.   I will place a referral for the repeat colon cancer screening or colonoscopy as well as the lung cancer screening.  If any concerns on your labs today I will let you know.  Blood pressure was slightly elevated today.  Sounds like it improves with recheck but it stayed mildly elevated in the office today.  Check your blood pressures at home if possible and bring a record with you to the next visit in the next few weeks.  We can adjust medications at that time if needed and we can review your labs.  Take care!

## 2023-08-14 NOTE — Telephone Encounter (Signed)
 Pt called in regards to requesting earlier appt ,Pt states that his job needs proof that the medication he is taking will not interfere with him being able to do his job.    Pt needed to see the Provider before the end of June . Pt would like call back if possible just  to clarify what's going on .

## 2023-08-14 NOTE — Telephone Encounter (Signed)
 Spoke w/Pt and wife regarding the request. Explained that Pt has not been seen at [facility] since 04/2021 and Pt PCP is now managing all his medications and is the provider who needs to give information regarding any medications that may interfere with Pt job. Wife also asked about the CPAP machine, stating the Pt insurance would no longer pay for the machine so DME company picked it up. Asking for a letter regarding CPAP. Explained that the DME company would need to provide a letter of when the machine was picked up as [employer] does not have that information. Pt and wife stated understanding. Pt wife asked if Pt needs another CPAP how can he get one. Informed wife Pt will need a new appt since he has not been seen. Pt wife stated understanding and thanks for the call back.

## 2023-08-14 NOTE — Progress Notes (Signed)
 Subjective:  Patient ID: Carlos Phillips, male    DOB: 10/09/1965  Age: 58 y.o. MRN: [id number]  CC:  Chief Complaint  Patient presents with   Annual Exam    Pt is here for annual exam Pt reports he has done DOT physical and he needs to have A1c checked today  Pt is request a letter stating C-pap is no longer needed.     HPI Carlos Phillips presents for Annual Exam  PCP, me Sleep specialist, Dr. Omar Bibber, Johny Nap, NP.  History of obstructive sleep apnea on CPAP, BiPAP., severe OSA noted previously.  Sleep study in 2022 with AHI of 38.8 and O2 nadir of 83%.  Last visit with neuro February 2023.  59-month follow-up recommended.  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   No new bleeding/bruising.  No new weakness or HA.   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 Needs updated A1c.  He states he has been off CPAP machine for 2 years, not snoring, lost weight. Denies daytime somnolence.    Diabetes: With history of hyperglycemia.  Prior use of insulin  and then decreasing doses followed by tapering off insulin  with improved control.  Unable to tolerate metformin .  Treated with Januvia  50 mg daily, Jardiance  10 mg daily, and on statin, ARB.  Denies urinary or mycotic infection symptoms or any side effects. Home readings 110-120 needs test strips.  No symptomatic lows. Microalbumin: Today Optho, foot exam, pneumovax:  Ophtho exam: has not scheduled. Pneumonia vaccine - PCV 23 few years ago.   Lab Results  Component Value Date   HGBA1C 6.7 (H) 03/02/2023   HGBA1C 6.3 08/15/2022   HGBA1C 6.7 (H) 12/23/2021   Lab Results  Component Value Date   MICROALBUR 0.7 12/23/2021   LDLCALC 111 (H) 03/02/2023   CREATININE 1.17 03/02/2023   Hyperlipidemia: Lipitor 40 mg daily, history of CVA as above.  Last LDL elevated at 111.  Recommend initially  diet changes, exercise and recheck within 3 to 6 months. Minimal weight change.  Past 8 weeks only eating one meal per day, protein shake in am. Healthier choices.   Wt Readings from Last 3 Encounters:  08/14/23 (!) 332 lb 4 oz (150.7 kg)  03/02/23 (!) 333 lb 9.6 oz (151.3 kg)  08/15/22 (!) 339 lb 3.2 oz (153.9 kg)    Lab Results  Component Value Date   CHOL 185 03/02/2023   HDL 57.60 03/02/2023   LDLCALC 111 (H) 03/02/2023   LDLDIRECT 92.0 05/06/2021   TRIG 82.0 03/02/2023   CHOLHDL 3 03/02/2023   Lab Results  Component Value Date   ALT 29 03/02/2023   AST 33 03/02/2023   ALKPHOS 69 03/02/2023   BILITOT 0.8 03/02/2023    Hypertension: Hydrochlorothiazide  25mg  every day, losartan  25mg  every day, amlodipine  10mg  every day.  Repeat BP at DOT exam was better. No home readings: BP Readings from Last 3 Encounters:  08/14/23 (!) 142/80  03/02/23 136/80  08/15/22 130/88   Lab Results  Component Value Date   CREATININE 1.17 03/02/2023   Erectile dysfunction: Effective with 1/2 dose viagra . No hearing/vision changes, no CP/DOE.        08/14/2023   12:47 PM 03/02/2023   11:38 AM 04/28/2022    2:49 PM 12/23/2021    1:55 PM 05/06/2021    9:38 AM  Depression screen PHQ 2/9  Decreased Interest 0 0  0 0 0  Down, Depressed, Hopeless 0 0 0 0 0  PHQ - 2 Score 0 0 0 0 0  Altered sleeping 0 0  0 0  Tired, decreased energy 0 0  0 1  Change in appetite 0 0  0 0  Feeling bad or failure about yourself  0 0  0 0  Trouble concentrating 0 0  0 0  Moving slowly or fidgety/restless 0 0  0 0  Suicidal thoughts 0 0  0 0  PHQ-9 Score 0 0  0 1    Health Maintenance  Topic Date Due   Lung Cancer Screening  Never done   Zoster Vaccines- Shingrix  (1 of 2) Never done   OPHTHALMOLOGY EXAM  08/04/2016   Pneumococcal Vaccine 83-56 Years old (2 of 2 - PPSV23) 05/28/2020   COVID-19 Vaccine (3 - 2024-25 season) 11/27/2022   Diabetic kidney evaluation - Urine ACR  12/24/2022   DTaP/Tdap/Td (2 -  Td or Tdap) 08/06/2023   FOOT EXAM  08/15/2023   HEMOGLOBIN A1C  08/31/2023   INFLUENZA VACCINE  10/27/2023   Diabetic kidney evaluation - eGFR measurement  03/01/2024   Colonoscopy  12/24/2025   Hepatitis C Screening  Completed   HIV Screening  Completed   HPV VACCINES  Aged Out   Meningococcal B Vaccine  Aged Out  Lung cancer screening: Referred in February 2024.  30-pack-year history, quit 10 years prior at that time. Did not go - got nervous. Agrees to go now.  Colonoscopy Dr. General Kenner, 12/25/2015.  Multiple polyps removed.  Repeat colonoscopy recommended in 3 years. Prostate: does not have family history of prostate cancer The natural history of prostate cancer and ongoing controversy regarding screening and potential treatment outcomes of prostate cancer has been discussed with the patient. The meaning of a false positive PSA and a false negative PSA has been discussed. He indicates understanding of the limitations of this screening test and wishes to proceed with screening PSA testing. Lab Results  Component Value Date   PSA1 0.3 08/03/2018   PSA 0.34 06/21/2015   PSA 0.31 06/15/2014   PSA 0.37 08/05/2013    Immunization History  Administered Date(s) Administered   Influenza,inj,Quad PF,6+ Mos 04/02/2020, 05/06/2021, 12/23/2021   PFIZER(Purple Top)SARS-COV-2 Vaccination 10/31/2019, 12/07/2019   Pneumococcal Conjugate-13 04/02/2020   Tdap 08/05/2013  Repeat tdap today and prevnar 20.  Shingrix  today.   No results found. - plans to call optho.   Dental: due for OV - appt last year.   Alcohol: weekends - few drinks only.  Tobacco: none - quit about 11 years ago.   Exercise: physical work. Yardwork.    History Patient Active Problem List   Diagnosis Date Noted   Diplopia    Ophthalmoplegia of right eye    CVA (cerebral vascular accident) (HCC) 03/24/2020   Acute CVA (cerebrovascular accident) (HCC) 03/24/2020   Hyperlipidemia 12/31/2016   PVD (peripheral vascular  disease) (HCC) 01/29/2016   Diverticulitis 01/28/2016   Special screening for malignant neoplasms, colon    Type 2 diabetes mellitus with diabetic dermatitis, without long-term current use of insulin  (HCC) 05/03/2015   History of deep vein thrombosis (DVT) of lower extremity 03/07/2013   Ischemia of extremity 02/25/2013   HTN (hypertension) 05/07/2011   Erectile dysfunction 05/07/2011   Obesity 05/07/2011   Past Medical History:  Diagnosis Date   Diabetes mellitus without complication (HCC)    DVT (deep venous thrombosis) (HCC) 02/2013   Erectile dysfunction    Hyperlipidemia  Hypertension    Obesity    BMI >52   PVD (peripheral vascular disease) [facility])    Past Surgical History:  Procedure Laterality Date   ABDOMINAL AORTAGRAM N/A 02/28/2013   Procedure: ABDOMINAL Tommi Fraise;  Surgeon: Arvil Lauber, MD;  Location: [facility] CATH LAB;  Service: Cardiovascular;  Laterality: N/A;   COLONOSCOPY WITH PROPOFOL  N/A 12/25/2015   Procedure: COLONOSCOPY WITH PROPOFOL ;  Surgeon: Danette Duos, MD;  Location: [facility] ENDOSCOPY;  Service: Gastroenterology;  Laterality: N/A;   EMBOLECTOMY Left 02/25/2013   Procedure: Left Popliteal EMBOLECTOMY Poss fasciotomy;  Surgeon: Richrd Char, MD;  Location: [facility] OR;  Service: Vascular;  Laterality: Left;  Left poplital and Tibial embolectomy with four compartment Fasciotomy with vein patch angioplasty left popliteal artery.   Allergies  Allergen Reactions   Morphine  And Codeine Nausea And Vomiting   Prior to Admission medications   Medication Sig Start Date End Date Taking? Authorizing Provider  amLODipine  (NORVASC ) 10 MG tablet Take 1 tablet (10 mg total) by mouth daily. 03/02/23  Yes Benjiman Bras, MD  atorvastatin  (LIPITOR) 40 MG tablet Take 1 tablet (40 mg total) by mouth daily. 03/02/23  Yes Benjiman Bras, MD  blood glucose meter kit and supplies Dispense based on patient and insurance preference. Use 3 times daily. E11.65 05/07/21  Yes Benjiman Bras, MD  clopidogrel  (PLAVIX ) 75 MG tablet TAKE 1 TABLET BY MOUTH EVERY DAY WITH BREAKFAST 03/02/23  Yes Benjiman Bras, MD  empagliflozin  (JARDIANCE ) 10 MG TABS tablet Take 1 tablet (10 mg total) by mouth daily before breakfast. 03/02/23  Yes Benjiman Bras, MD  glucose blood ([employer] GLUCOSE METER TEST STRIPS) test strip Use as instructed once per day. 03/02/23  Yes Benjiman Bras, MD  hydrochlorothiazide  (HYDRODIURIL ) 25 MG tablet Take 1 tablet (25 mg total) by mouth daily. 03/02/23  Yes Benjiman Bras, MD  losartan  (COZAAR ) 25 MG tablet Take 1 tablet (25 mg total) by mouth daily. 03/02/23  Yes Benjiman Bras, MD  Multiple Vitamins-Minerals (MULTIVITAMIN WITH MINERALS) tablet Take 1 tablet by mouth daily.   Yes [provider]  sildenafil  (VIAGRA ) 100 MG tablet TAKE 1/2 TO 1 TABLET BY MOUTH EVERY DAY AS NEEDED FOR ERECTILE DYSFUNCTION 03/02/23  Yes Benjiman Bras, MD  sitaGLIPtin  (JANUVIA ) 50 MG tablet Take 1 tablet (50 mg total) by mouth daily. 03/02/23  Yes Benjiman Bras, MD   Social History   Socioeconomic History   Marital status: Married    Spouse name: Genevia Kern   Number of children: 2   Years of education: Not on file   Highest education level: Not on file  Occupational History   Occupation: Truck driver  Tobacco Use   Smoking status: Former    Current packs/day: 0.00    Average packs/day: 1 pack/day for 30.0 years (30.0 ttl pk-yrs)    Types: Cigarettes    Start date: 02/26/1983    Quit date: 02/25/2013    Years since quitting: 10.4   Smokeless tobacco: Never  Vaping Use   Vaping status: Never Used  Substance and Sexual Activity   Alcohol use: Yes    Alcohol/week: 4.0 standard drinks of alcohol    Types: 4 Standard drinks or equivalent per week    Comment: social- rare   Drug use: No   Sexual activity: Yes    Partners: Female  Other Topics Concern   Not on file  Social History Narrative   Not on file   Social  Drivers of Research scientist (physical sciences) Strain: Not on file  Food Insecurity: Not on file  Transportation Needs: Not on file  Physical Activity: Not on file  Stress: Not on file  Social Connections: Not on file  Intimate Partner Violence: Not on file    Review of Systems 13 point review of systems per patient health survey noted.  Negative other than as indicated above or in HPI.    Objective:   Vitals:   08/14/23 1250 08/14/23 1304  BP: (!) 142/72 (!) 142/80  Pulse: 74   Temp: 98.3 F (36.8 C)   SpO2: 100%   Weight: (!) 332 lb 4 oz (150.7 kg)   Height: 5\' 11"  (1.803 m)      Physical Exam Vitals reviewed.  Constitutional:      Appearance: He is well-developed. He is obese.  HENT:     Head: Normocephalic and atraumatic.     Right Ear: External ear normal.     Left Ear: External ear normal.  Eyes:     Conjunctiva/sclera: Conjunctivae normal.     Pupils: Pupils are equal, round, and reactive to light.  Neck:     Thyroid: No thyromegaly.     Vascular: No carotid bruit or JVD.  Cardiovascular:     Rate and Rhythm: Normal rate and regular rhythm.     Heart sounds: Normal heart sounds. No murmur heard. Pulmonary:     Effort: Pulmonary effort is normal. No respiratory distress.     Breath sounds: Normal breath sounds. No wheezing or rales.  Abdominal:     General: There is no distension.     Palpations: Abdomen is soft.     Tenderness: There is no abdominal tenderness.  Musculoskeletal:        General: No tenderness. Normal range of motion.     Cervical back: Normal range of motion and neck supple.     Right lower leg: No edema.     Left lower leg: No edema.  Lymphadenopathy:     Cervical: No cervical adenopathy.  Skin:    General: Skin is warm and dry.  Neurological:     Mental Status: He is alert and oriented to person, place, and time.     Deep Tendon Reflexes: Reflexes are normal and symmetric.  Psychiatric:        Mood and Affect: Mood normal.        Behavior: Behavior normal.         Assessment & Plan:  KILE KABLER is a 58 y.o. male . Annual physical exam  - -anticipatory guidance as below in AVS, screening labs above. Health maintenance items as above in HPI discussed/recommended as applicable.   Erectile dysfunction, unspecified erectile dysfunction type - Plan: sildenafil  (VIAGRA ) 100 MG tablet  - viagra  Rx given - use lowest effective dose. Side effects discussed (including but not limited to headache/flushing, blue discoloration of vision, possible vascular steal and risk of cardiac effects if underlying unknown coronary artery disease, and permanent sensorineural hearing loss). Understanding expressed.  Essential hypertension - Plan: amLODipine  (NORVASC ) 10 MG tablet, losartan  (COZAAR ) 25 MG tablet, hydrochlorothiazide  (HYDRODIURIL ) 25 MG tablet, CBC, TSH  - Borderline control, home monitoring with close follow-up and then can adjust meds if needed.  Compliance with CPAP would also likely help.  See below.  Hypertriglyceridemia - Plan: atorvastatin  (LIPITOR) 40 MG tablet, Comprehensive metabolic panel with GFR, Lipid panel  - Check labs, possibly may need increased dose of statin or  change in statin for secondary prevention with prior CVA.  Tolerating current dose Lipitor.  History of CVA (cerebrovascular accident) - Plan: atorvastatin  (LIPITOR) 40 MG tablet, CBC  - Symptoms or residual symptoms, continue to treat hypertension as above, lipids and monitor blood sugar for glycemic control  PVD (peripheral vascular disease) (HCC) - Plan: clopidogrel  (PLAVIX ) 75 MG tablet  - Stable without any new side effects with Plavix  continue same.  Type 2 diabetes mellitus with hyperglycemia, without long-term current use of insulin  (HCC) - Plan: Hemoglobin A1c, glucose blood ([employer] GLUCOSE METER TEST STRIPS) test strip, Urine Microalbumin w/creat. ratio, empagliflozin  (JARDIANCE ) 10 MG TABS tablet, sitaGLIPtin  (JANUVIA ) 50 MG tablet  - Tolerating current med  regimen, check labs and adjust plan accordingly new testing strips ordered for monitoring  Need for pneumococcal vaccination - Plan: Pneumococcal conjugate vaccine 20-valent (Prevnar 20)  Screen for colon cancer - Plan: Ambulatory referral to Gastroenterology  Screening for lung cancer - Plan: Ambulatory Referral Lung Cancer Screening [facility]  Screening for prostate cancer - Plan: PSA  OSA (obstructive sleep apnea)  - No recent use of CPAP, severe OSA previously.  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  He is asymptomatic at this time, denies daytime somnolence.  Recommend he contact his sleep specialist to consider repeat study if needed and coordination of CPAP device restart if needed.  Need for diphtheria-tetanus-pertussis (Tdap) vaccine - Plan: Tdap vaccine greater than or equal to 7yo IM  Need for shingles vaccine - Plan: Zoster Recombinant (Shingrix  )   Meds ordered this encounter  Medications   glucose blood ([employer] GLUCOSE METER TEST STRIPS) test strip    Sig: Use as instructed once per day.    Dispense:  100 each    Refill:  12    Dispense for patients brand of meter.   sildenafil  (VIAGRA ) 100 MG tablet    Sig: TAKE 1/2 TO 1 TABLET BY MOUTH EVERY DAY AS NEEDED FOR ERECTILE DYSFUNCTION    Dispense:  4 tablet    Refill:  3   amLODipine  (NORVASC ) 10 MG tablet    Sig: Take 1 tablet (10 mg total) by mouth daily.    Dispense:  90 tablet    Refill:  1   atorvastatin  (LIPITOR) 40 MG tablet    Sig: Take 1 tablet (40 mg total) by mouth daily.    Dispense:  90 tablet    Refill:  1   clopidogrel  (PLAVIX ) 75 MG tablet    Sig: TAKE 1 TABLET BY MOUTH EVERY DAY WITH BREAKFAST    Dispense:  90 tablet    Refill:  1   empagliflozin  (JARDIANCE ) 10 MG TABS tablet    Sig: Take 1 tablet (10 mg total) by mouth daily before breakfast.    Dispense:  30  tablet    Refill:  2   losartan  (COZAAR ) 25 MG tablet    Sig: Take 1 tablet (25 mg total) by mouth daily.    Dispense:  90 tablet    Refill:  1   sitaGLIPtin  (JANUVIA ) 50 MG tablet    Sig: Take 1 tablet (50 mg total) by mouth daily.    Dispense:  90 tablet    Refill:  1   hydrochlorothiazide  (HYDRODIURIL ) 25 MG tablet    Sig: Take 1 tablet (25 mg total) by mouth daily.    Dispense:  90 tablet  Refill:  1   Patient Instructions  You are overdue for follow up with your sleep specialist. They can discuss options. If you feel like you do not have sleep apnea after losing weight, they can discuss repeat testing. 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   Call and schedule eye specialist visit for diabetic eye exam and have them send me that report.   I recommend healthy meals during the day, not just one meal per day. Keep up the good work with healthy foods.   I will place a referral for the repeat colon cancer screening or colonoscopy as well as the lung cancer screening.  If any concerns on your labs today I will let you know.  Blood pressure was slightly elevated today.  Sounds like it improves with recheck but it stayed mildly elevated in the office today.  Check your blood pressures at home if possible and bring a record with you to the next visit in the next few weeks.  We can adjust medications at that time if needed and we can review your labs.  Take care!      Signed,   Caro Christmas, MD [facility], [facility] 08/14/23 1:34 PM

## 2023-08-15 ENCOUNTER — Encounter: Payer: Self-pay | Admitting: Family Medicine

## 2023-08-15 LAB — COMPREHENSIVE METABOLIC PANEL WITH GFR
ALT: 11 U/L (ref 0–53)
AST: 16 U/L (ref 0–37)
Albumin: 4.4 g/dL (ref 3.5–5.2)
Alkaline Phosphatase: 65 U/L (ref 39–117)
BUN: 14 mg/dL (ref 6–23)
CO2: 28 meq/L (ref 19–32)
Calcium: 9.8 mg/dL (ref 8.4–10.5)
Chloride: 98 meq/L (ref 96–112)
Creatinine, Ser: 1.12 mg/dL (ref 0.40–1.50)
GFR: 72.34 mL/min (ref >60.00)
Glucose, Bld: 113 mg/dL — ABNORMAL HIGH (ref 70–99)
Potassium: 4 meq/L (ref 3.5–5.1)
Sodium: 137 meq/L (ref 135–145)
Total Bilirubin: 0.6 mg/dL (ref 0.2–1.2)
Total Protein: 7.1 g/dL (ref 6.0–8.3)

## 2023-08-15 LAB — CBC
HCT: 46.4 % (ref 39.0–52.0)
Hemoglobin: 15.2 g/dL (ref 13.0–17.0)
MCHC: 32.8 g/dL (ref 30.0–36.0)
MCV: 88.4 fl (ref 78.0–100.0)
Platelets: 262 10*3/uL (ref 150.0–400.0)
RBC: 5.24 Mil/uL (ref 4.22–5.81)
RDW: 15.6 % — ABNORMAL HIGH (ref 11.5–15.5)
WBC: 8.5 10*3/uL (ref 4.0–10.5)

## 2023-08-15 LAB — LIPID PANEL
Cholesterol: 195 mg/dL (ref 0–200)
HDL: 69.2 mg/dL (ref >39.00)
LDL Cholesterol: 99 mg/dL (ref 0–99)
NonHDL: 126.01
Total CHOL/HDL Ratio: 3
Triglycerides: 135 mg/dL (ref 0.0–149.0)
VLDL: 27 mg/dL (ref 0.0–40.0)

## 2023-08-15 LAB — PSA: PSA: 0.7 ng/mL (ref 0.10–4.00)

## 2023-08-15 LAB — TSH: TSH: 1.55 u[IU]/mL (ref 0.35–5.50)

## 2023-08-15 LAB — HEMOGLOBIN A1C: Hgb A1c MFr Bld: 6.4 % (ref 4.6–6.5)

## 2023-08-16 ENCOUNTER — Telehealth: Payer: Self-pay | Admitting: Family Medicine

## 2023-08-16 NOTE — Telephone Encounter (Signed)
 Copied from CRM [id number]. 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

## 2023-08-16 NOTE — Telephone Encounter (Signed)
 Pt called about getting a letter typed up from you like he got you to do for him last year. Letter was documented 5.22.24. He stated he need a letter like that again for this year for his job if possible. Please advise, Thanks

## 2023-08-16 NOTE — Telephone Encounter (Signed)
 Are you willing to write a similar letter to last years about patients conditions?

## 2023-08-17 NOTE — Telephone Encounter (Signed)
 I should be able to provide a letter - will try to have it done by mid next week. Please advise patient.

## 2023-08-18 NOTE — Telephone Encounter (Signed)
 Patient is okay with picking up letter Tuesday or Wednesday next week if that is okay

## 2023-08-19 ENCOUNTER — Ambulatory Visit: Payer: Self-pay | Admitting: Family Medicine

## 2023-08-22 ENCOUNTER — Encounter: Payer: Self-pay | Admitting: Family Medicine

## 2023-08-22 NOTE — Telephone Encounter (Signed)
 letter completed and placed in fax bin at back nurse station - ready for pickup.

## 2023-08-23 NOTE — Telephone Encounter (Signed)
 Called patient and LM to make him aware of letter completion, I have placed this in the front pick up folder

## 2023-08-31 ENCOUNTER — Encounter: Payer: BC Managed Care – PPO | Admitting: Family Medicine

## 2023-09-28 ENCOUNTER — Ambulatory Visit: Admitting: Family Medicine

## 2024-01-02 ENCOUNTER — Other Ambulatory Visit: Payer: Self-pay | Admitting: Family Medicine

## 2024-01-02 DIAGNOSIS — N529 Male erectile dysfunction, unspecified: Secondary | ICD-10-CM

## 2024-05-10 ENCOUNTER — Ambulatory Visit: Admitting: Family Medicine

## 2024-08-15 ENCOUNTER — Encounter: Admitting: Family Medicine
